# Patient Record
Sex: Male | Born: 1950 | Race: White | Hispanic: No | Marital: Married | Smoking: Never smoker
Health system: Southern US, Community
[De-identification: ages and names within clinical notes are randomized; demographics above are authoritative.]

## PROBLEM LIST (undated history)

## (undated) DIAGNOSIS — R740 Nonspecific elevation of levels of transaminase and lactic acid dehydrogenase [LDH]: Secondary | ICD-10-CM

## (undated) DIAGNOSIS — I428 Other cardiomyopathies: Secondary | ICD-10-CM

## (undated) DIAGNOSIS — R7401 Elevation of levels of liver transaminase levels: Secondary | ICD-10-CM

## (undated) DIAGNOSIS — F419 Anxiety disorder, unspecified: Secondary | ICD-10-CM

## (undated) DIAGNOSIS — Z95810 Presence of automatic (implantable) cardiac defibrillator: Secondary | ICD-10-CM

## (undated) DIAGNOSIS — K219 Gastro-esophageal reflux disease without esophagitis: Secondary | ICD-10-CM

## (undated) DIAGNOSIS — I472 Ventricular tachycardia, unspecified: Secondary | ICD-10-CM

## (undated) DIAGNOSIS — I10 Essential (primary) hypertension: Secondary | ICD-10-CM

## (undated) DIAGNOSIS — R55 Syncope and collapse: Secondary | ICD-10-CM

## (undated) DIAGNOSIS — D126 Benign neoplasm of colon, unspecified: Secondary | ICD-10-CM

## (undated) DIAGNOSIS — E119 Type 2 diabetes mellitus without complications: Secondary | ICD-10-CM

## (undated) HISTORY — DX: Ventricular tachycardia, unspecified: I47.20

## (undated) HISTORY — PX: GALLBLADDER SURGERY: SHX652

## (undated) HISTORY — DX: Other cardiomyopathies: I42.8

## (undated) HISTORY — DX: Elevation of levels of liver transaminase levels: R74.01

## (undated) HISTORY — DX: Nonspecific elevation of levels of transaminase and lactic acid dehydrogenase (ldh): R74.0

## (undated) HISTORY — DX: Ventricular tachycardia: I47.2

## (undated) HISTORY — DX: Presence of automatic (implantable) cardiac defibrillator: Z95.810

## (undated) HISTORY — DX: Anxiety disorder, unspecified: F41.9

## (undated) HISTORY — PX: CARDIAC DEFIBRILLATOR PLACEMENT: SHX171

## (undated) HISTORY — DX: Type 2 diabetes mellitus without complications: E11.9

## (undated) HISTORY — PX: TONSILLECTOMY AND ADENOIDECTOMY: SHX28

## (undated) HISTORY — PX: HERNIA REPAIR: SHX51

## (undated) HISTORY — DX: Benign neoplasm of colon, unspecified: D12.6

## (undated) HISTORY — PX: CHOLECYSTECTOMY: SHX55

## (undated) HISTORY — DX: Gastro-esophageal reflux disease without esophagitis: K21.9

## (undated) HISTORY — DX: Syncope and collapse: R55

## (undated) HISTORY — DX: Essential (primary) hypertension: I10

---

## 1998-04-27 ENCOUNTER — Ambulatory Visit (HOSPITAL_COMMUNITY): Admission: RE | Admit: 1998-04-27 | Discharge: 1998-04-27 | Payer: Self-pay | Admitting: Urology

## 1998-05-06 ENCOUNTER — Emergency Department (HOSPITAL_COMMUNITY): Admission: EM | Admit: 1998-05-06 | Discharge: 1998-05-06 | Payer: Self-pay | Admitting: Emergency Medicine

## 2009-12-17 LAB — CBC AND DIFFERENTIAL
Platelets: 229 10*3/uL (ref 150–399)
WBC: 6.9 10^3/mL

## 2009-12-17 LAB — BASIC METABOLIC PANEL
Creatinine: 1 mg/dL (ref <=1.3)
Glucose: 100 mg/dL
Potassium: 4.4 mmol/L (ref 3.4–5.3)

## 2009-12-17 LAB — LIPID PANEL
Cholesterol: 192 mg/dL (ref 0–200)
LDL Cholesterol: 111 mg/dL
Triglycerides: 240 mg/dL — AB (ref 40–160)

## 2009-12-17 LAB — TSH: TSH: 2.31 u[IU]/mL (ref <=5.90)

## 2011-03-22 ENCOUNTER — Encounter: Payer: Self-pay | Admitting: Internal Medicine

## 2011-04-28 ENCOUNTER — Encounter: Payer: Self-pay | Admitting: Internal Medicine

## 2011-08-16 ENCOUNTER — Encounter: Payer: Self-pay | Admitting: Internal Medicine

## 2011-09-05 ENCOUNTER — Encounter: Payer: Self-pay | Admitting: Internal Medicine

## 2011-09-05 ENCOUNTER — Encounter: Payer: Self-pay | Admitting: *Deleted

## 2011-09-06 ENCOUNTER — Encounter: Payer: Self-pay | Admitting: Internal Medicine

## 2011-09-06 ENCOUNTER — Ambulatory Visit (INDEPENDENT_AMBULATORY_CARE_PROVIDER_SITE_OTHER): Payer: BC Managed Care – PPO | Admitting: Internal Medicine

## 2011-09-06 VITALS — BP 116/74 | HR 90 | Ht 65.0 in | Wt 202.1 lb

## 2011-09-06 DIAGNOSIS — I472 Ventricular tachycardia: Secondary | ICD-10-CM | POA: Insufficient documentation

## 2011-09-06 DIAGNOSIS — Z95810 Presence of automatic (implantable) cardiac defibrillator: Secondary | ICD-10-CM | POA: Insufficient documentation

## 2011-09-06 DIAGNOSIS — R55 Syncope and collapse: Secondary | ICD-10-CM

## 2011-09-06 DIAGNOSIS — F419 Anxiety disorder, unspecified: Secondary | ICD-10-CM | POA: Insufficient documentation

## 2011-09-06 NOTE — Assessment & Plan Note (Signed)
The patient has had problems with ventricular tachycardia and syncope. He had storm following ICD implantation prompting amiodarone and this begs the question as to whether the ICD lead implantation itself was proarrhythmic. In any cas he has had some PT since then treated with antitachycardia pacing and he is on amiodarone at 100 mg a day.  The underlying pathophysiology as to why he is having ventricular tachycardia in the setting of a structurally normal heart is also not clear. There is a reference to the EP study about single PVCs. This is certainly been described. I will need to review this with his electrophysiologist from [place].

## 2011-09-06 NOTE — Progress Notes (Signed)
HPI: Dakota Gutierrez is a 60 y.o. male Seen to establish EP care in [place].  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 He was in [place] and he underwent an evaluation which because of nonsustained ventricular tachycardia included an EP study. He was found to be easily inducible to polymorphic ventricular tachycardia and received an ICD. Unfortunately, in the days that followed he developed VT storm with multiple shocks and was treated with amiodarone which has been gradually down titrated to 100 mg a day. He had an episode of monomorphic ventricular tachycardia in June terminated by antitachycardia pacing. He has lived in fear of the next shock. His anxiety has been progressively less debilitating.  He has no significant exercise intolerance. He denies chest pain shortness of breath. His evaluation also included a stress test that was nonischemic in echo that confirmed normal left ventricular function. These data are reviewed Current Outpatient Prescriptions  Medication Sig Dispense Refill  . amiodarone (PACERONE) 200 MG tablet Take 100 mg by mouth daily.        Marland Kitchen aspirin 325 MG tablet Take 325 mg by mouth daily.        Marland Kitchen losartan-hydrochlorothiazide (HYZAAR) 100-12.5 MG per tablet Take 1 tablet by mouth daily.        . metoprolol (TOPROL-XL) 50 MG 24 hr tablet Take 50 mg by mouth every 12 (twelve) hours.        . Omega-3 Fatty Acids (FISH OIL) 1000 MG CAPS Take 1 capsule by mouth daily.        . tadalafil (CIALIS) 20 MG tablet Take 20 mg by mouth daily as needed.        . valACYclovir (VALTREX) 500 MG tablet Take 250 mg by mouth daily.          No Known Allergies  Past Medical History  Diagnosis Date  . Syncope   . Tachycardia   . HTN (hypertension)     Past Surgical History  Procedure Date  . Cardiac defibrillator placement     No family history on file.  History   Social History  . Marital Status: Unknown    Spouse Name: N/A    Number of Children: N/A  .  Years of Education: N/A   Occupational History  . Not on file.   Social History Main Topics  . Smoking status: Never Smoker   . Smokeless tobacco: Never Used  . Alcohol Use: Yes  . Drug Use: No  . Sexually Active: Not on file   Other Topics Concern  . Not on file   Social History Narrative  . No narrative on file    Fourteen point review of systems was negative except as noted in HPI and PMH   PHYSICAL EXAMINATION  Blood pressure 116/74, pulse 90, height 5\' 5"  (1.651 m), weight 202 lb 1.9 oz (91.681 kg).   Well developed and nourished in no acute distress HENT normal Neck supple with JVP-flat Carotids brisk and full without bruits Back without scoliosis or kyphosis Clear Regular rate and rhythm, no murmurs or gallops Abd-soft with active BS without hepatomegaly or midline pulsation Femoral pulses 2+ distal pulses intact No Clubbing cyanosis edema Skin-warm and dry LN-neg submandibular and supraclavicular A & Oriented CN 3-12 normal  Grossly normal sensory and motor function Affect engaging .

## 2011-09-06 NOTE — Patient Instructions (Signed)
Your physician wants you to follow-up in: 3 months. You will receive a reminder letter in the mail two months in advance. If you don't receive a letter, please call our office to schedule the follow-up appointment.  Call us if you decide you want to see Dr. Caralyn Guile.

## 2011-09-06 NOTE — Assessment & Plan Note (Signed)
I have offered him consultation with Dr. Dellia Cloud for posttraumatic stress disorder. He'll let us know.

## 2011-09-06 NOTE — Assessment & Plan Note (Signed)
The patient's device was interrogated.  The information was reviewed. No changes were made in the programming. His ICD is programmed unusually. He is set at a lower rate of 80 with rate response on. He is 100% atrially paced. I turned him to a lower rate of 40 and he has intrinsic conduction at 70 beats per minute; there was an isolated PVC and I wonder whether the atrial rate was chosen for overdrive suppression of ventricular ectopy.

## 2011-09-06 NOTE — Assessment & Plan Note (Signed)
No recurrent syncope. I advised him that in [place] ventricular tachycardia and syncope in the setting of a structurally abnormal heart visit for the presence of his ICD preclude driving for 6 months

## 2011-09-19 ENCOUNTER — Telehealth: Payer: Self-pay | Admitting: Internal Medicine

## 2011-09-19 NOTE — Telephone Encounter (Signed)
Pt GF calling wanting to know if pt needs to be seen in Jan 2013 or sooner. MD was suppose to contact pt previous MD in [place] Dr. Nedra Hai and get back to pt if he needed to be seen before the recall. Please return pt GF call to discuss if pt needs to be seen sooner.

## 2011-09-19 NOTE — Telephone Encounter (Signed)
havenot spoken with him   Will try and get up with him this week

## 2011-09-19 NOTE — Telephone Encounter (Signed)
Mr Kindleberger's girlfriend wants to know if Dr Graciela Husbands talked to Dr Nedra Hai in [place], [place] about his past treatment and if Mr Allie Bossier needs to be seen sooner after talking to Dr Nedra Hai.  Please call and let her know.

## 2011-09-20 NOTE — Telephone Encounter (Signed)
Pt was notified.  

## 2011-09-21 NOTE — Telephone Encounter (Signed)
I spoke with Clayborne Dana to obtain the number for Dr. Nedra Hai.   Dr. Eustace Pen [facility]- [facility]. [place] [phone]  I explained to [facility] that Dr. Graciela Husbands was going to try to speak to Dr. Nedra Hai today and that we can hopefully be back in touch with them today, tomorrow at the latest. Per Clayborne Dana, they will be going back to [facility] tomorrow. We can try her on her cell at [phone] or leave a message at the home number as she will be checking their messages.

## 2011-09-21 NOTE — Telephone Encounter (Signed)
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 There is no thought that the ICD lead was proarrhythmic. Consideration was given to catheter ablation of the PVCs; however, because he was moving to [place] this was deferred. There is no 12-lead on cardiographic recording of the PVCs.

## 2011-09-21 NOTE — Telephone Encounter (Signed)
I spoke with the patient and outlined the previous note will see him in 2 months to look at the PBC burden.  We may consider transitioning his amiodarone to  A 1c agent for a PVC suppression

## 2011-11-10 ENCOUNTER — Encounter: Payer: Self-pay | Admitting: *Deleted

## 2011-11-11 ENCOUNTER — Encounter: Payer: Self-pay | Admitting: Internal Medicine

## 2011-11-11 ENCOUNTER — Ambulatory Visit (INDEPENDENT_AMBULATORY_CARE_PROVIDER_SITE_OTHER): Payer: BC Managed Care – PPO | Admitting: Internal Medicine

## 2011-11-11 DIAGNOSIS — I472 Ventricular tachycardia: Secondary | ICD-10-CM

## 2011-11-11 DIAGNOSIS — Z95810 Presence of automatic (implantable) cardiac defibrillator: Secondary | ICD-10-CM

## 2011-11-11 DIAGNOSIS — R55 Syncope and collapse: Secondary | ICD-10-CM

## 2011-11-11 DIAGNOSIS — F411 Generalized anxiety disorder: Secondary | ICD-10-CM

## 2011-11-11 DIAGNOSIS — F419 Anxiety disorder, unspecified: Secondary | ICD-10-CM

## 2011-11-11 MED ORDER — AMIODARONE HCL 200 MG PO TABS: ORAL_TABLET | ORAL | Status: DC

## 2011-11-11 MED ORDER — METOPROLOL TARTRATE 50 MG PO TABS: 50.0000 mg | ORAL_TABLET | Freq: Two times a day (BID) | ORAL | Status: DC

## 2011-11-11 NOTE — Assessment & Plan Note (Signed)
Much improved

## 2011-11-11 NOTE — Assessment & Plan Note (Addendum)
The patient's device was interrogated.  The information was reviewed. No changes were made in the programming.    Will reprogram pacing rate 80>>75 and anticipate decrease again in 6 months

## 2011-11-11 NOTE — Patient Instructions (Signed)
Your physician recommends that you have lab work today: bmp/liver/tsh   Your physician has recommended you make the following change in your medication:  1) Decrease aspirin to 81 mg once daily.  Remote monitoring is used to monitor your Pacemaker of ICD from home. This monitoring reduces the number of office visits required to check your device to one time per year. It allows [name] to keep an eye on the functioning of your device to ensure it is working properly. You are scheduled for a device check from home on 02/16/12. You may send your transmission at any time that day. If you have a wireless device, the transmission will be sent automatically. After your physician reviews your transmission, you will receive a postcard with your next transmission date.  Your physician wants you to follow-up in: 6 months. You will receive a reminder letter in the mail two months in advance. If you don't receive a letter, please call our office to schedule the follow-up appointment.

## 2011-11-11 NOTE — Assessment & Plan Note (Addendum)
No recurrent ventricular tachycardia. We'll check amiodarone surveillance laboratories today. Also as he is on Hydrocchlor thiazide we will check his potassium level  Also we will decrease his aspirin from 325>>81 risk-benefit

## 2011-11-11 NOTE — Progress Notes (Signed)
Addended by: Sherri Rad C on: 11/11/2011 11:23 AM   Modules accepted: Orders

## 2011-11-11 NOTE — Progress Notes (Signed)
  HPI  Dakota Gutierrez is a 60 y.o. 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 Unfortunately, in the days that followed he developed VT storm with multiple shocks and was treated with amiodarone which has been gradually down titrated to 100 mg a day. He had an episode of monomorphic ventricular tachycardia in June terminated by antitachycardia pacing.   The patient denies chest pain, shortness of breath, nocturnal dyspnea, orthopnea or peripheral edema.  There have been no palpitations, lightheadedness or syncope. He has had much less anxiety   Past Medical History  Diagnosis Date  . Syncope   . Ventricular tachycardia     storm following ICD implant on amiodarone   . HTN (hypertension)   . Dual implantable cardiac defibrillator in situ     Medtronic [id number] Protecta  . 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 Cardiac defibrillator placement     Medtronic [id number] Protecta    Current Outpatient Prescriptions  Medication Sig Dispense Refill  . amiodarone (PACERONE) 200 MG tablet Take 100 mg by mouth daily.        Marland Kitchen aspirin 325 MG tablet Take 325 mg by mouth daily.        Marland Kitchen losartan-hydrochlorothiazide (HYZAAR) 100-12.5 MG per tablet Take 1 tablet by mouth daily.        . metoprolol (TOPROL-XL) 50 MG 24 hr tablet Take 50 mg by mouth every 12 (twelve) hours.        . Omega-3 Fatty Acids (FISH OIL) 1000 MG CAPS Take 1 capsule by mouth daily.        . tadalafil (CIALIS) 20 MG tablet Take 20 mg by mouth daily as needed.        . valACYclovir (VALTREX) 500 MG tablet Take 250 mg by mouth daily.          No Known Allergies  Review of Systems negative except from HPI and PMH  Physical Exam Well developed and well nourished in no acute distress HENT normal E scleral and icterus clear Neck Supple JVP flat; carotids brisk and full Clear to  ausculation Regular rate and rhythm, no murmurs gallops or rub Soft with active bowel sounds No clubbing cyanosis none Edema Alert and oriented, grossly normal motor and sensory function Skin Warm and Dry  ECG demonstrates sinus rhythm at 97 with intervals of 0.2 to +0.10/435 Axis is -10 Assessment and  Plan

## 2011-11-11 NOTE — Assessment & Plan Note (Signed)
No recurrent syncope 

## 2011-11-16 ENCOUNTER — Telehealth: Payer: Self-pay | Admitting: *Deleted

## 2011-11-16 DIAGNOSIS — I472 Ventricular tachycardia: Secondary | ICD-10-CM

## 2011-11-16 NOTE — Telephone Encounter (Signed)
I spoke with the patient's wife. In checking his chart, I did not see his labs posted. Mellody Dance checked this. We verified with the patient that he did get stuck the day he was here. Per Keith's call to the lab, blood may still be on the rack. I have made the patient's wife aware that we will need to have him come in for another lab draw. They verbalize understanding.

## 2011-11-17 ENCOUNTER — Other Ambulatory Visit: Payer: BC Managed Care – PPO | Admitting: *Deleted

## 2011-11-18 ENCOUNTER — Other Ambulatory Visit: Payer: BC Managed Care – PPO | Admitting: *Deleted

## 2011-11-18 LAB — BASIC METABOLIC PANEL
BUN: 18 mg/dL (ref 6–23)
Calcium: 8.9 mg/dL (ref 8.4–10.5)
Chloride: 103 mEq/L (ref 96–112)
Creatinine, Ser: 1.1 mg/dL (ref 0.4–1.5)
GFR: 75.9 mL/min (ref >60.00)

## 2011-11-18 LAB — HEPATIC FUNCTION PANEL
Bilirubin, Direct: 0.1 mg/dL (ref 0.0–0.3)
Total Bilirubin: 0.8 mg/dL (ref 0.3–1.2)

## 2011-11-18 LAB — TSH: TSH: 2.16 u[IU]/mL (ref 0.35–5.50)

## 2011-11-19 ENCOUNTER — Encounter: Payer: Self-pay | Admitting: Internal Medicine

## 2011-11-24 ENCOUNTER — Other Ambulatory Visit: Payer: Self-pay | Admitting: *Deleted

## 2011-11-24 DIAGNOSIS — Z79899 Other long term (current) drug therapy: Secondary | ICD-10-CM

## 2011-11-24 DIAGNOSIS — I4891 Unspecified atrial fibrillation: Secondary | ICD-10-CM

## 2011-12-08 ENCOUNTER — Encounter (INDEPENDENT_AMBULATORY_CARE_PROVIDER_SITE_OTHER): Payer: BC Managed Care – PPO | Admitting: Family Medicine

## 2011-12-08 ENCOUNTER — Ambulatory Visit: Payer: Self-pay | Admitting: Family Medicine

## 2011-12-08 DIAGNOSIS — F411 Generalized anxiety disorder: Secondary | ICD-10-CM

## 2011-12-08 DIAGNOSIS — I10 Essential (primary) hypertension: Secondary | ICD-10-CM

## 2011-12-08 DIAGNOSIS — R1013 Epigastric pain: Secondary | ICD-10-CM

## 2011-12-08 DIAGNOSIS — K3189 Other diseases of stomach and duodenum: Secondary | ICD-10-CM

## 2011-12-08 NOTE — Progress Notes (Signed)
This encounter was created in error - please disregard.

## 2011-12-22 ENCOUNTER — Encounter: Payer: BC Managed Care – PPO | Attending: Family Medicine | Admitting: *Deleted

## 2011-12-22 ENCOUNTER — Encounter: Payer: Self-pay | Admitting: *Deleted

## 2011-12-22 DIAGNOSIS — Z713 Dietary counseling and surveillance: Secondary | ICD-10-CM | POA: Insufficient documentation

## 2011-12-22 DIAGNOSIS — E669 Obesity, unspecified: Secondary | ICD-10-CM | POA: Insufficient documentation

## 2011-12-22 NOTE — Progress Notes (Signed)
  Medical Nutrition Therapy:  Appt start time: 1000 end time:  1100.  Assessment:  Primary concerns today: Obesity.  Pt here for assessment of obesity.  S/P pacemaker and defib placement on 04/28/11 with VT Storm shortly following surgery. Pt now stable and desires wt loss. Reports a "bad diet" prior to surgery, but has made many positive dietary changes since.  Excessive CHO intake noted. Has been exercising since beginning of 2013; tires easily d/t heart condition. No restrictions reported.   MEDICATIONS: See medication list; reconciled with pt.   DIETARY INTAKE:  Usual eating pattern includes 2-3 meals and 2 snacks per day.  24-hr recall:  B ( AM): (1 cup) Raisin Bran or Special K w/ 2% milk (drinks milk), toast (whole wheat) w/ butter (2 tsp); water Snk ( AM): banana or orange; water  L ( PM): Pizza (pep, mush, extra chs) - 1.5 pcs; water Snk ( PM): ritz or saltines w/ PB or cheese - 2 or 3 D ( PM): Eats out or Ham sandwich w/ mustard, lettuce on wheat, sometimes chips (handful); 8 oz root beer or water Snk ( PM): None Beverages: Reg root beer  Usual physical activity:  Since 11/29/11 - 30 min tv workouts (cardio/strength) @ 4x/wk, walks 1-2.5 miles @ 2x/wk; Getting elliptical tomorrow.    Estimated energy needs: 1400-1600 calories 175-200 g carbohydrates 85-100 g protein 40-45 g fat (<15g as saturated fat)  Progress Towards Goal(s):  In progress.   Nutritional Diagnosis:  [place]-3.3 Obesity related to previous excessive intake of CHO and fat as evidenced by patient reported food history and a BMI of 33.5 kg/m^2.    Intervention/Main Goals:  Follow "Phase Plan" as discussed using yellow card.  Eat 3 meals/day, Avoid meal skipping.   Limit carbohydrate to 45g per meal and 15g per snack.  Add protein rich foods to all meals/snacks.   Choose more whole grains, lean protein, low-fat dairy, and fruits/non-starchy vegetables.   Aim for >30 min of physical activity daily.  Limit  sugar-sweetened beverages, concentrated sweets, and high fat/fried foods.    Monitoring/Evaluation:  Dietary intake, exercise, and body weight in 6 week(s).

## 2011-12-22 NOTE — Patient Instructions (Addendum)
Goals:  Follow "Phase Plan" as discussed using yellow card.  Eat 3 meals/day, Avoid meal skipping.   Limit carbohydrate to 45g per meal and 15g per snack.  Add protein rich foods to all meals/snacks.   Choose more whole grains, lean protein, low-fat dairy, and fruits/non-starchy vegetables.   Aim for >30 min of physical activity daily.  Limit sugar-sweetened beverages, concentrated sweets, and high fat/fried foods.

## 2011-12-29 ENCOUNTER — Ambulatory Visit (INDEPENDENT_AMBULATORY_CARE_PROVIDER_SITE_OTHER): Payer: BC Managed Care – PPO | Admitting: Family Medicine

## 2011-12-29 ENCOUNTER — Encounter: Payer: Self-pay | Admitting: Family Medicine

## 2011-12-29 VITALS — BP 109/79 | HR 100 | Temp 97.3°F | Resp 20 | Ht 65.5 in | Wt 199.0 lb

## 2011-12-29 DIAGNOSIS — R197 Diarrhea, unspecified: Secondary | ICD-10-CM

## 2011-12-29 DIAGNOSIS — A09 Infectious gastroenteritis and colitis, unspecified: Secondary | ICD-10-CM

## 2011-12-29 DIAGNOSIS — E669 Obesity, unspecified: Secondary | ICD-10-CM

## 2011-12-29 MED ORDER — VALACYCLOVIR HCL 1 G PO TABS: 1000.0000 mg | ORAL_TABLET | Freq: Every day | ORAL | Status: AC

## 2011-12-29 NOTE — Patient Instructions (Signed)
Continue current medications and manage diet and exercise as discussed.

## 2011-12-29 NOTE — Progress Notes (Signed)
  Subjective:    Patient ID: Quintavious Rinck, male    DOB: 1951-11-01, 61 y.o.   MRN: [id number]  GI Problem The primary symptoms include diarrhea. Primary symptoms do not include fatigue, abdominal pain, nausea, melena or hematochezia. Episode onset: Pt is here to review response to medication prescribed to treat H.pylori infection. The problem has been gradually improving.  The illness does not include dysphagia or constipation.  Medication prescribed: Prevpak which he finishes today and then continue with Omeprazole for 2 months.  Also, pt has had a visit with Nutritionist to discuss weight loss and dietary changes; he has made some small modifications in diet and has set a weight loss goal of 25 lbs. He returns for follow-up re: weight loss in 2 months.    Review of Systems  Constitutional: Negative for fatigue.  Gastrointestinal: Positive for diarrhea. Negative for dysphagia, nausea, abdominal pain, constipation, melena and hematochezia.       Objective:   Physical Exam  Vitals reviewed. Constitutional: He is oriented to person, place, and time. He appears well-developed and well-nourished.  Pulmonary/Chest: Effort normal.  Neurological: He is oriented to person, place, and time.  Psychiatric: He has a normal mood and affect.   He appears to be in distress; lab data pertaining to H.pylori was reviewed.       Assessment & Plan:   1. Diarrhea of infectious origin - secondary to H. Pylori and improving with appropriate treatment. Pt to continue PPI for 2 months.  2. Obesity - appropriate goals set and pt understands some basic changes to be made to achieve 25 -lb. weight loss in 6- month time frame. Follow-up with nutrition specialist as scheduled.

## 2012-01-12 ENCOUNTER — Encounter: Payer: Self-pay | Admitting: Family Medicine

## 2012-01-18 ENCOUNTER — Telehealth: Payer: Self-pay | Admitting: Internal Medicine

## 2012-01-18 LAB — ICD DEVICE OBSERVATION
AL IMPEDENCE ICD: 399 Ohm
AL THRESHOLD: 0.75 V
ATRIAL PACING ICD: 98.99 pct
BATTERY VOLTAGE: 3.173 V
CHARGE TIME: 7.887 s
DEV-0020ICD: NEGATIVE
TOT-0001: 6
TOT-0002: 7
TOT-0006: 20120531000000
TZAT-0001ATACH: 1
TZAT-0001ATACH: 2
TZAT-0001ATACH: 3
TZAT-0001FASTVT: 1
TZAT-0001SLOWVT: 1
TZAT-0002ATACH: NEGATIVE
TZAT-0002FASTVT: NEGATIVE
TZAT-0011SLOWVT: 10 ms
TZAT-0012ATACH: 150 ms
TZAT-0012ATACH: 150 ms
TZAT-0012ATACH: 150 ms
TZAT-0013SLOWVT: 3
TZAT-0018ATACH: NEGATIVE
TZAT-0018ATACH: NEGATIVE
TZAT-0018SLOWVT: NEGATIVE
TZAT-0019ATACH: 6 V
TZAT-0019SLOWVT: 8 V
TZAT-0020ATACH: 1.5 ms
TZON-0003ATACH: 350 ms
TZON-0003VSLOWVT: 450 ms
TZON-0004SLOWVT: 28
TZON-0004VSLOWVT: 48
TZON-0005SLOWVT: 16
TZST-0001ATACH: 4
TZST-0001ATACH: 6
TZST-0001FASTVT: 3
TZST-0001FASTVT: 5
TZST-0001SLOWVT: 2
TZST-0001SLOWVT: 3
TZST-0001SLOWVT: 5
TZST-0001SLOWVT: 6
TZST-0002ATACH: NEGATIVE
TZST-0002FASTVT: NEGATIVE
TZST-0002FASTVT: NEGATIVE
TZST-0003SLOWVT: 35 J
TZST-0003SLOWVT: 35 J
VENTRICULAR PACING ICD: 19.27 pct

## 2012-01-18 NOTE — Telephone Encounter (Signed)
New msg Pt had some questions about defib ck please call

## 2012-01-18 NOTE — Telephone Encounter (Signed)
Spoke w/pt and answered questions about Field seismologist. Pt has transmitter hooked up and to send initial transmission today. Pt to call with any questions.

## 2012-01-19 ENCOUNTER — Other Ambulatory Visit (INDEPENDENT_AMBULATORY_CARE_PROVIDER_SITE_OTHER): Payer: BC Managed Care – PPO

## 2012-01-19 DIAGNOSIS — Z79899 Other long term (current) drug therapy: Secondary | ICD-10-CM

## 2012-01-19 DIAGNOSIS — I4891 Unspecified atrial fibrillation: Secondary | ICD-10-CM

## 2012-01-19 DIAGNOSIS — I472 Ventricular tachycardia: Secondary | ICD-10-CM

## 2012-01-19 LAB — HEPATIC FUNCTION PANEL
ALT: 47 U/L (ref 0–53)
AST: 47 U/L — ABNORMAL HIGH (ref 0–37)
Bilirubin, Direct: 0.1 mg/dL (ref 0.0–0.3)
Total Bilirubin: 0.8 mg/dL (ref 0.3–1.2)
Total Protein: 7.2 g/dL (ref 6.0–8.3)

## 2012-01-19 LAB — BASIC METABOLIC PANEL
BUN: 18 mg/dL (ref 6–23)
CO2: 27 mEq/L (ref 19–32)
Chloride: 103 mEq/L (ref 96–112)
Creatinine, Ser: 1.2 mg/dL (ref 0.4–1.5)
Potassium: 3.6 mEq/L (ref 3.5–5.1)

## 2012-01-19 LAB — TSH: TSH: 2.71 u[IU]/mL (ref 0.35–5.50)

## 2012-01-20 LAB — HEPATIC FUNCTION PANEL
ALT: 47 U/L (ref 0–53)
AST: 52 U/L — ABNORMAL HIGH (ref 0–37)
Albumin: 4.5 g/dL (ref 3.5–5.2)
Total Bilirubin: 0.5 mg/dL (ref 0.3–1.2)

## 2012-01-27 ENCOUNTER — Other Ambulatory Visit: Payer: Self-pay | Admitting: *Deleted

## 2012-01-27 DIAGNOSIS — Z79899 Other long term (current) drug therapy: Secondary | ICD-10-CM

## 2012-01-27 DIAGNOSIS — I472 Ventricular tachycardia: Secondary | ICD-10-CM

## 2012-01-30 ENCOUNTER — Telehealth: Payer: Self-pay | Admitting: Internal Medicine

## 2012-01-30 NOTE — Telephone Encounter (Signed)
Dakota Gutierrez's girlfriend is concerned about Dakota Gutierrez stopping the amiodarone.  She doesn't understand why he was on it in the first place if it can be stopped because of elevated liver tests.  She also wants to know if his heart is going to be ok and will be in rhythm if he stops the amiodarone?  She is requesting a call from Dr Odessa Fleming nurse to discuss this situation and give her more information.  She is also wondering if he should have an appt to discuss this face to face with  Dr Graciela Husbands.

## 2012-01-30 NOTE — Telephone Encounter (Signed)
New Msg: Pt girlfriend calling wanting to speak with nurse/MD regarding pt stopping taking amiodarone. Please return call to discuss further.

## 2012-01-31 NOTE — Telephone Encounter (Signed)
i am glad to sit down and review this in the office  The concern is the toxicity of the drug suggested by the liver tests and we made to use different medications  thx

## 2012-01-31 NOTE — Telephone Encounter (Signed)
Sending to MD

## 2012-02-01 NOTE — Telephone Encounter (Signed)
Pt's girlfriend called and pt has not stopped amiodarone like he was told, they have cut it down but didn't want to go off completely with out another med in it's place, she wants to know if he needs appt with klein to discuss or can he call him? Will he be put on another med? Home # [phone] or girlfriend patti cell [phone]/mt

## 2012-02-01 NOTE — Telephone Encounter (Signed)
Patient's girlfriend called upset about patient stopping amoidarone,wanting appointment with Dr.Klein to discuss.Appointment scheduled with Dr.Klein 02/02/12.

## 2012-02-02 ENCOUNTER — Encounter: Payer: Self-pay | Admitting: Internal Medicine

## 2012-02-02 ENCOUNTER — Encounter: Payer: Self-pay | Admitting: *Deleted

## 2012-02-02 ENCOUNTER — Encounter: Payer: BC Managed Care – PPO | Attending: Family Medicine | Admitting: *Deleted

## 2012-02-02 ENCOUNTER — Ambulatory Visit (INDEPENDENT_AMBULATORY_CARE_PROVIDER_SITE_OTHER): Payer: BC Managed Care – PPO | Admitting: Internal Medicine

## 2012-02-02 DIAGNOSIS — F411 Generalized anxiety disorder: Secondary | ICD-10-CM

## 2012-02-02 DIAGNOSIS — F419 Anxiety disorder, unspecified: Secondary | ICD-10-CM

## 2012-02-02 DIAGNOSIS — I472 Ventricular tachycardia: Secondary | ICD-10-CM

## 2012-02-02 DIAGNOSIS — R7401 Elevation of levels of liver transaminase levels: Secondary | ICD-10-CM

## 2012-02-02 DIAGNOSIS — Z713 Dietary counseling and surveillance: Secondary | ICD-10-CM | POA: Insufficient documentation

## 2012-02-02 DIAGNOSIS — I428 Other cardiomyopathies: Secondary | ICD-10-CM

## 2012-02-02 DIAGNOSIS — E669 Obesity, unspecified: Secondary | ICD-10-CM | POA: Insufficient documentation

## 2012-02-02 LAB — ICD DEVICE OBSERVATION
AL AMPLITUDE: 3.375 mv
AL IMPEDENCE ICD: 437 Ohm
AL THRESHOLD: 0.625 V
BAMS-0001: 170 {beats}/min
BATTERY VOLTAGE: 3.1798 V
FVT: 0
RV LEAD AMPLITUDE: 15.25 mv
RV LEAD IMPEDENCE ICD: 437 Ohm
RV LEAD THRESHOLD: 0.625 V
TOT-0006: 20120531000000
TZAT-0001ATACH: 1
TZAT-0001ATACH: 3
TZAT-0002ATACH: NEGATIVE
TZAT-0002ATACH: NEGATIVE
TZAT-0004SLOWVT: 10
TZAT-0011SLOWVT: 10 ms
TZAT-0012ATACH: 150 ms
TZAT-0012FASTVT: 200 ms
TZAT-0018ATACH: NEGATIVE
TZAT-0018ATACH: NEGATIVE
TZAT-0018FASTVT: NEGATIVE
TZAT-0019ATACH: 6 V
TZAT-0019ATACH: 6 V
TZAT-0019FASTVT: 8 V
TZAT-0019SLOWVT: 8 V
TZAT-0020FASTVT: 1.5 ms
TZAT-0020SLOWVT: 1.5 ms
TZST-0001ATACH: 5
TZST-0001ATACH: 6
TZST-0001FASTVT: 5
TZST-0001FASTVT: 6
TZST-0001SLOWVT: 3
TZST-0001SLOWVT: 4
TZST-0001SLOWVT: 6
TZST-0002ATACH: NEGATIVE
TZST-0002ATACH: NEGATIVE
TZST-0002FASTVT: NEGATIVE
TZST-0002FASTVT: NEGATIVE
TZST-0002FASTVT: NEGATIVE
TZST-0003SLOWVT: 15 J
TZST-0003SLOWVT: 35 J
VF: 0

## 2012-02-02 NOTE — Assessment & Plan Note (Signed)
As above.

## 2012-02-02 NOTE — Assessment & Plan Note (Signed)
i presume this is related to amiodarone   We have discussed stopping and the patient and family have concenrs given the context in which it was initiated   I think we need to stop the drug and see if LFT elevation resolves they are reluctantly agreeable

## 2012-02-02 NOTE — Progress Notes (Signed)
  Medical Nutrition Therapy:  Appt start time: 10:15  end time:  10:30.  Assessment:  Primary concerns today: Obesity; Follow up. Pt returns for f/u on obesity dx. Short appointment d/t pt being late. Reports doing well (down 8 lbs) until 01/21/12 when his ex-wife passed away. Extreme sadness and stress brought on emotional eating, which caused a 5 lb weight gain. Pt feels he is back on track now and is doing well.  Does report some pain in chest area around defibrillator which he plans to contact MD about. Has been walking and doing elliptical for 1 mi/30 min. Still no dietary restrictions reported.   MEDICATIONS: See medication list; No changes reported.   DIETARY INTAKE:  Usual eating pattern includes 2-3 meals and 2 snacks per day.  24-hr recall:  Reports cutting down portions after getting "back on wagon" after death in family.   Usual physical activity:  Recently walking 1 mile @ 2-3d/wk; elliptical for 30 min  Estimated energy needs: 1400-1600 calories 175-200 g carbohydrates 85-100 g protein 40-45 g fat (<15g as saturated fat)  Progress Towards Goal(s):  In progress.   Nutritional Diagnosis:  [place]-3.3 Obesity related to previous excessive intake of CHO and fat as evidenced by patient reported food history and a BMI of 33.5 kg/m^2.    Intervention/Main Goals:  Resume previous nutrition and exercise goals (per MD clearance).  Follow up in 3 months after next MD appointment.    Monitoring/Evaluation:  Dietary intake, exercise, and body weight in 3 months.

## 2012-02-02 NOTE — Progress Notes (Signed)
  HPI  Dakota Gutierrez is a 61 y.o. 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 Unfortunately, in the days that followed he developed VT storm with multiple shocks and was treated with amiodarone which has been gradually down titrated to 100 mg a day. He had an episode of monomorphic ventricular tachycardia in June terminated by antitachycardia pacing.   The patient denies chest pain, shortness of breath, nocturnal dyspnea, orthopnea or peripheral edema.  There have been no palpitations, lightheadedness or syncope. He has had much less anxiety  Amio surveillance demonstrated elevation Of LFTs   Past Medical History  Diagnosis Date  . Syncope   . Ventricular tachycardia     storm following ICD implant on amiodarone   . HTN (hypertension)   . Dual implantable cardiac defibrillator in situ     Medtronic [id number] Protecta  . 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 Elevated transaminase level     10/2011  ? amio  . Other primary cardiomyopathies     Past Surgical History  Procedure Date  . Cardiac defibrillator placement     Medtronic [id number] Protecta  . Tonsillectomy and adenoidectomy   . Gallbladder surgery   . Hernia repair     Current Outpatient Prescriptions  Medication Sig Dispense Refill  . amiodarone (PACERONE) 200 MG tablet Take 200 mg by mouth daily.      Marland Kitchen aspirin EC 81 MG tablet Take 1 tablet (81 mg total) by mouth daily.      Marland Kitchen losartan-hydrochlorothiazide (HYZAAR) 100-12.5 MG per tablet Take 1 tablet by mouth daily.        . metoprolol (LOPRESSOR) 50 MG tablet Take 1 tablet (50 mg total) by mouth 2 (two) times daily.  180 tablet  3  . Omega-3 Fatty Acids (FISH OIL) 1000 MG CAPS Take 1 capsule by mouth 2 (two) times daily.       Marland Kitchen omeprazole (PRILOSEC) 20 MG capsule Take 20 mg by mouth daily.      . tadalafil (CIALIS) 20 MG tablet Take 20 mg by mouth daily as needed.         . valACYclovir (VALTREX) 1000 MG tablet Take 1 tablet (1,000 mg total) by mouth daily.  30 tablet  5    Allergies  Allergen Reactions  . Adhesive (Tape)     Review of Systems negative except from HPI and PMH  Physical Exam Well developed and well nourished in no acute distress HENT normal E scleral and icterus clear Neck Supple JVP flat; carotids brisk and full Clear to ausculation Regular rate and rhythm, no murmurs gallops or rub Soft with active bowel sounds No clubbing cyanosis none Edema Alert and oriented, grossly normal motor and sensory function Skin Warm and Dry    Assessment and  Plan

## 2012-02-02 NOTE — Patient Instructions (Signed)
Goals:  Resume previous nutrition and exercise goals (per MD clearance).  Follow up in 3 months after next MD appointment.

## 2012-02-16 ENCOUNTER — Encounter: Payer: BC Managed Care – PPO | Admitting: *Deleted

## 2012-02-22 ENCOUNTER — Encounter: Payer: Self-pay | Admitting: *Deleted

## 2012-02-28 ENCOUNTER — Other Ambulatory Visit: Payer: Self-pay | Admitting: Family Medicine

## 2012-03-06 ENCOUNTER — Telehealth: Payer: Self-pay | Admitting: Internal Medicine

## 2012-03-06 NOTE — Telephone Encounter (Signed)
New Problem:    Patient received a no show letter for his last transmission date of 02/16/12 when he was under the impression that the appointment would be moved and he would be notified.  Patient will be transmitting today and wanted to give [name] a heads up.

## 2012-03-07 NOTE — Telephone Encounter (Signed)
Spoke with patient.  Appt for remote was old and should have been cancelled when seen last month.  Apologized to patient for confusion and advised that we will check device in June as scheduled with Dr Graciela Husbands.

## 2012-03-22 ENCOUNTER — Ambulatory Visit: Payer: BC Managed Care – PPO | Admitting: Family Medicine

## 2012-03-23 ENCOUNTER — Encounter: Payer: Self-pay | Admitting: Family Medicine

## 2012-03-23 ENCOUNTER — Ambulatory Visit (INDEPENDENT_AMBULATORY_CARE_PROVIDER_SITE_OTHER): Payer: BC Managed Care – PPO | Admitting: Family Medicine

## 2012-03-23 VITALS — BP 115/74 | HR 104 | Temp 98.2°F | Resp 18 | Ht 65.0 in | Wt 194.6 lb

## 2012-03-23 DIAGNOSIS — K625 Hemorrhage of anus and rectum: Secondary | ICD-10-CM

## 2012-03-23 DIAGNOSIS — R197 Diarrhea, unspecified: Secondary | ICD-10-CM

## 2012-03-23 LAB — POCT CBC
Granulocyte percent: 56.9 %G (ref 37–80)
Hemoglobin: 14.8 g/dL (ref 14.1–18.1)
MPV: 9.3 fL (ref 0–99.8)
POC Granulocyte: 3.5 (ref 2–6.9)
POC MID %: 7.8 %M (ref 0–12)
Platelet Count, POC: 226 10*3/uL (ref 142–424)
RBC: 5 M/uL (ref 4.69–6.13)

## 2012-03-23 NOTE — Patient Instructions (Signed)
Rectal Bleeding  Rectal bleeding is when blood passes out of the anus. It is usually a sign that something is wrong. It may not be serious, but it should always be evaluated. Rectal bleeding may present as bright red blood or extremely dark stools. The color may range from dark red or maroon to black (like tar). It is important that the cause of rectal bleeding be identified so treatment can be started and the problem corrected.  CAUSES    Hemorrhoids. These are enlarged (dilated) blood vessels or veins in the anal or rectal area.   Fistulas. Theseare abnormal, burrowing channels that usually run from inside the rectum to the skin around the anus. They can bleed.   Anal fissures. This is a tear in the tissue of the anus. Bleeding occurs with bowel movements.   Diverticulosis. This is a condition in which pockets or sacs project from the bowel wall. Occasionally, the sacs can bleed.   Diverticulitis. Thisis an infection involving diverticulosis of the colon.   Proctitis and colitis. These are conditions in which the rectum, colon, or both, can become inflamed and pitted (ulcerated).   Polyps and cancer. Polyps are non-cancerous (benign) growths in the colon that may bleed. Certain types of polyps turn into cancer.   Protrusion of the rectum. Part of the rectum can project from the anus and bleed.   Certain medicines.   Intestinal infections.   Blood vessel abnormalities.  HOME CARE INSTRUCTIONS   Eat a high-fiber diet to keep your stool soft.   Limit activity.   Drink enough fluids to keep your urine clear or pale yellow.   Warm baths may be useful to soothe rectal pain.   Follow up with your caregiver as directed.  SEEK IMMEDIATE MEDICAL CARE IF:   You develop increased bleeding.   You have black or dark red stools.   You vomit blood or material that looks like coffee grounds.   You have abdominal pain or tenderness.   You have a fever.   You feel weak, nauseous, or you faint.   You have  severe rectal pain or you are unable to have a bowel movement.  MAKE SURE YOU:   Understand these instructions.   Will watch your condition.   Will get help right away if you are not doing well or get worse.  Document Released: 05/06/2002 Document Revised: 11/03/2011 Document Reviewed: 05/01/2011  ExitCare Patient Information 2012 [employer].

## 2012-03-24 ENCOUNTER — Encounter: Payer: Self-pay | Admitting: Family Medicine

## 2012-03-24 NOTE — Progress Notes (Signed)
  Subjective:    Patient ID: Dakota Gutierrez, male    DOB: 05/11/1951, 61 y.o.   MRN: [id number]  HPI  This middle-aged Cauc male returns for follow-up regarding GI issues.He is accompanied today  by Alexia Freestone, his girlfriend. He completed treatment for H.pylori and has a few days of Omeprazole to  finish. He still has several loose stools daily and has recently noticed bloody stools; he has minimal  rampy abdominal pain. No fever, nausea or vomiting.Marland Kitchen His last CRS was ~7 years ago and he was  advised to repeat at 10 years. Pt has no hx of hemorrhoids.         His most recent Cardiology visit was significant for discontinuation of Amiodarone; he feels much better  of this medication.  Review of Systems  Constitutional: Negative for fever, chills, diaphoresis, appetite change and unexpected weight change.  Respiratory: Negative for cough, chest tightness and shortness of breath.   Cardiovascular: Negative for chest pain and palpitations.  Gastrointestinal: Positive for blood in stool. Negative for nausea, vomiting, abdominal pain, constipation and rectal pain.  Genitourinary: Negative.   Musculoskeletal: Negative.   Neurological: Negative for dizziness, weakness and light-headedness.  Hematological: Negative.         Objective:   Physical Exam  Nursing note and vitals reviewed. Constitutional: He is oriented to person, place, and time. He appears well-developed and well-nourished. No distress.  HENT:  Head: Normocephalic and atraumatic.  Eyes: Conjunctivae and EOM are normal. No scleral icterus.  Cardiovascular: Normal rate.   Pulmonary/Chest: Effort normal. No respiratory distress.  Abdominal: Soft. Bowel sounds are normal. He exhibits no distension and no mass. There is no tenderness. There is no guarding.  Genitourinary:       Pt and girlfriend both confirm blood in stools- no rectal exam performed.  Neurological: He is alert and oriented to person, place, and time. No cranial  nerve deficit.  Skin: Skin is warm and dry. No pallor.  Psychiatric: He has a normal mood and affect. His behavior is normal.    Results for orders placed in visit on 03/23/12  POCT CBC      Component Value Range   WBC 6.2  4.6 - 10.2 (K/uL)   Lymph, poc 2.2  0.6 - 3.4    POC LYMPH PERCENT 35.3  10 - 50 (%L)   MID (cbc) 0.5  0 - 0.9    POC MID % 7.8  0 - 12 (%M)   POC Granulocyte 3.5  2 - 6.9    Granulocyte percent 56.9  37 - 80 (%G)   RBC 5.00  4.69 - 6.13 (M/uL)   Hemoglobin 14.8  14.1 - 18.1 (g/dL)   HCT, POC 84.1  32.4 - 53.7 (%)   MCV 89.2  80 - 97 (fL)   MCH, POC 29.6  27 - 31.2 (pg)   MCHC 33.2  31.8 - 35.4 (g/dL)   RDW, POC 40.1     Platelet Count, POC 226  142 - 424 (K/uL)   MPV 9.3  0 - 99.8 (fL)         Assessment & Plan:   1. Bright red rectal bleeding   Ambulatory referral to Gastroenterology  2. Diarrhea  May be side effect of PPI; will not refill Omeprazole once pt finishes current supply

## 2012-03-28 ENCOUNTER — Other Ambulatory Visit (INDEPENDENT_AMBULATORY_CARE_PROVIDER_SITE_OTHER): Payer: BC Managed Care – PPO

## 2012-03-28 DIAGNOSIS — Z79899 Other long term (current) drug therapy: Secondary | ICD-10-CM

## 2012-03-28 DIAGNOSIS — I472 Ventricular tachycardia: Secondary | ICD-10-CM

## 2012-03-28 LAB — HEPATIC FUNCTION PANEL
AST: 69 U/L — ABNORMAL HIGH (ref 0–37)
Alkaline Phosphatase: 47 U/L (ref 39–117)
Total Bilirubin: 2.8 mg/dL — ABNORMAL HIGH (ref 0.3–1.2)

## 2012-04-10 ENCOUNTER — Telehealth: Payer: Self-pay | Admitting: Internal Medicine

## 2012-04-10 DIAGNOSIS — I472 Ventricular tachycardia: Secondary | ICD-10-CM

## 2012-04-10 NOTE — Telephone Encounter (Signed)
Fu call °Pt returning your call  °

## 2012-04-10 NOTE — Telephone Encounter (Signed)
I spoke with the patient and he is aware of his results. He will come on 5/16 for a repeat liver functions tests due to possible hemolysis on the previous result.

## 2012-04-12 ENCOUNTER — Other Ambulatory Visit: Payer: BC Managed Care – PPO

## 2012-04-12 ENCOUNTER — Encounter: Payer: Self-pay | Admitting: Internal Medicine

## 2012-04-13 ENCOUNTER — Other Ambulatory Visit (INDEPENDENT_AMBULATORY_CARE_PROVIDER_SITE_OTHER): Payer: BC Managed Care – PPO

## 2012-04-13 DIAGNOSIS — I472 Ventricular tachycardia: Secondary | ICD-10-CM

## 2012-04-13 LAB — HEPATIC FUNCTION PANEL
ALT: 39 U/L (ref 0–53)
AST: 41 U/L — ABNORMAL HIGH (ref 0–37)
Albumin: 4.3 g/dL (ref 3.5–5.2)
Alkaline Phosphatase: 50 U/L (ref 39–117)

## 2012-04-16 ENCOUNTER — Encounter: Payer: Self-pay | Admitting: Internal Medicine

## 2012-04-16 ENCOUNTER — Ambulatory Visit (INDEPENDENT_AMBULATORY_CARE_PROVIDER_SITE_OTHER): Payer: BC Managed Care – PPO | Admitting: Internal Medicine

## 2012-04-16 VITALS — BP 110/84 | HR 92 | Ht 65.0 in | Wt 198.2 lb

## 2012-04-16 DIAGNOSIS — R194 Change in bowel habit: Secondary | ICD-10-CM

## 2012-04-16 DIAGNOSIS — K219 Gastro-esophageal reflux disease without esophagitis: Secondary | ICD-10-CM

## 2012-04-16 DIAGNOSIS — K625 Hemorrhage of anus and rectum: Secondary | ICD-10-CM

## 2012-04-16 DIAGNOSIS — Z8619 Personal history of other infectious and parasitic diseases: Secondary | ICD-10-CM

## 2012-04-16 DIAGNOSIS — R198 Other specified symptoms and signs involving the digestive system and abdomen: Secondary | ICD-10-CM

## 2012-04-16 MED ORDER — PEG-KCL-NACL-NASULF-NA ASC-C 100 G PO SOLR: 1.0000 | Freq: Once | ORAL | Status: DC

## 2012-04-16 MED ORDER — FAMOTIDINE 20 MG PO TABS: 20.0000 mg | ORAL_TABLET | Freq: Two times a day (BID) | ORAL | Status: AC | PRN

## 2012-04-16 NOTE — Patient Instructions (Signed)
You have been scheduled for a colonoscopy. Please follow written instructions given to you at your visit today. Please pick up your prep at the pharmacy within the next 2-3 days.  You have been scheduled for a lab: H-pylori stool antigen, to be done in mid-June.  We have sent the following medications to your pharmacy for you to pick up at your convenience: Moviprep

## 2012-04-16 NOTE — Progress Notes (Signed)
Subjective:    Patient ID: Dakota Gutierrez, male    DOB: 1951-02-04, 61 y.o.   MRN: [id number]  HPI Dakota Gutierrez is a 61 yo male with history of nonsustained VT s/p ACID placement and anxiety who is seen in consultation at the request of Dr. Audria Nine for evaluation of rectal bleeding and pain. The patient reports that he has a history of very rare, intermittent bright red rectal bleeding which was occurring 1-2 times a month. However over the last several weeks to a month he has had this more frequently. It had increased about once per week and since last Wednesday it has been daily. He's also noted a change in his bowel habits, previously he was having 2 formed stools a day, and now he is having one formed stool in the morning followed by loose stools after each meal. This does 4-5 bowel movements a day. No nocturnal stools. He notes bright red rectal bleeding with clots associated with his postprandial bowel movements. He notes no pain with passing stool, but he does report significant tenesmus. He also feels some lower, cramping with defecation. No other abdominal pain. No nausea or vomiting. Appetite is "pretty good". No dysphagia. He does report heartburn, previously treated by omeprazole 20 mg daily. He had a nice response with excellent control of his heartburn on omeprazole. However this medication was discontinued and he is now using when necessary famotidine. He does have success with the famotidine, but finds he needs it 5/7 days. He reports being diagnosed with H. pylori by serum antibody, and he completed a Prevpac in early May. PPI was discontinued after this.  No weight loss.  No fevers or chills.   Review of Systems As per history of present illness, otherwise negative  Patient Active Problem List  Diagnoses  . Syncope  . Ventricular tachycardia  . Anxiety  . Dual implantable cardiac defibrillator [employer]  . Obesity  . Other primary cardiomyopathies  . Elevated  transaminase level   Current Outpatient Prescriptions  Medication Sig Dispense Refill  . aspirin EC 81 MG tablet Take 1 tablet (81 mg total) by mouth daily.      . famotidine (PEPCID) 20 MG tablet Take 1 tablet (20 mg total) by mouth 2 (two) times daily as needed.  60 tablet  4  . losartan-hydrochlorothiazide (HYZAAR) 100-12.5 MG per tablet Take 1 tablet by mouth daily. NEEDS OFFICE VISIT FOR MORE  30 tablet  0  . metoprolol (LOPRESSOR) 50 MG tablet Take 1 tablet (50 mg total) by mouth 2 (two) times daily.  180 tablet  3  . Omega-3 Fatty Acids (FISH OIL) 1000 MG CAPS Take 1 capsule by mouth 2 (two) times daily.       . tadalafil (CIALIS) 20 MG tablet Take 20 mg by mouth daily as needed.        . valACYclovir (VALTREX) 1000 MG tablet Take 1 tablet (1,000 mg total) by mouth daily.  30 tablet  5  . DISCONTD: famotidine (PEPCID) 20 MG tablet Take 20 mg by mouth 2 (two) times daily as needed.      . peg 3350 powder (MOVIPREP) 100 G SOLR Take 1 kit (100 g total) by mouth once.  1 kit  0   Allergies  Allergen Reactions  . Adhesive (Tape)    FH - negative for IBD  History  Substance Use Topics  . Smoking status: Never Smoker   . Smokeless tobacco: Never Used  . 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 No distress. HEENT: Normocephalic and atraumatic. Oropharynx is clear and moist. No oropharyngeal exudate. Conjunctivae are normal. Pupils are equal round and reactive to light. No scleral icterus. Neck: Neck supple. Trachea midline. Cardiovascular: Normal rate, regular rhythm and intact distal pulses. No M/R/G, AICD left upper chest Pulmonary/chest: Effort normal and breath sounds normal. No wheezing, rales or rhonchi. Abdominal: Soft, nontender, nondistended. Bowel sounds active  throughout. There are no masses palpable. No hepatosplenomegaly. Extremities: no clubbing, cyanosis, or edema Lymphadenopathy: No cervical adenopathy noted. Neurological: Alert and oriented to person place and time. Skin: Skin is warm and dry. No rashes noted. Psychiatric: Normal mood and affect. Behavior is normal.  CBC    Component Value Date/Time   WBC 6.2 03/23/2012 1424   WBC 6.9 12/17/2009   RBC 5.00 03/23/2012 1424   HGB 14.8 03/23/2012 1424   HGB 16.4 12/17/2009   HCT 44.6 03/23/2012 1424   HCT 47 12/17/2009   PLT 229 12/17/2009   MCV 89.2 03/23/2012 1424   MCH 29.6 03/23/2012 1424   MCHC 33.2 03/23/2012 1424   CMP     Component Value Date/Time   NA 138 01/19/2012 0903   NA 141 12/17/2009   K 3.6 01/19/2012 0903   CL 103 01/19/2012 0903   CO2 27 01/19/2012 0903   GLUCOSE 121* 01/19/2012 0903   BUN 18 01/19/2012 0903   BUN 10 12/17/2009   CREATININE 1.2 01/19/2012 0903   CREATININE 1.0 12/17/2009   CALCIUM 9.0 01/19/2012 0903   PROT 7.4 04/13/2012 1401   ALBUMIN 4.3 04/13/2012 1401   AST 41* 04/13/2012 1401   ALT 39 04/13/2012 1401   ALKPHOS 50 04/13/2012 1401   BILITOT 0.8 04/13/2012 1401   Colonoscopy, 05/27/2004 -- exam to the cecum, good prep. Assessment normal examination.    Assessment & Plan:  61 yo male with history of nonsustained VT s/p ACID placement and anxiety who is seen in consultation at the request of Dr. Audria Nine for evaluation of rectal bleeding and pain  1. Rectal bleeding/change in bowel habits/tenesmus --  The differential of the patient's current symptoms includes inflammatory bowel disease, especially in light of his change in bowel habits with rectal bleeding and tenesmus. Certainly this could be hemorrhoidal disease, but hemorrhoidal disease alone should not cause change in bowel habit nor tenesmus. We discussed further evaluation by colonoscopy, and I've explained the risks and benefits. He is agreeable to proceed. This will be scheduled in the [facility] with propofol  sedation. Further recommendations thereafter.  2. GERD -- the patient does have near daily heartburn, which is relieved with famotidine 20 mg. He reports this seems to be worse after stopping omeprazole, and perhaps this is secondary to rebound phenomenon with gastric elevation it can be seen during PPI therapy. I've explained this may improve, and he can continue to use famotidine 20 mg twice a day when necessary for heartburn. If his symptoms continue to be a daily phenomenon, then I suggest PPI therapy. He understands this recommendation. Finally, he was treated for H. pylori which was diagnosed by serum antibody. He is not currently having signs and symptoms of Helicobacter gastroduodenitis, but does make sense to confirm eradication. We will schedule stool antigen for mid June.

## 2012-04-26 ENCOUNTER — Ambulatory Visit (AMBULATORY_SURGERY_CENTER): Payer: BC Managed Care – PPO | Admitting: Internal Medicine

## 2012-04-26 ENCOUNTER — Encounter: Payer: Self-pay | Admitting: Internal Medicine

## 2012-04-26 VITALS — BP 150/93 | HR 78 | Temp 97.4°F | Resp 14 | Ht 65.0 in | Wt 198.0 lb

## 2012-04-26 DIAGNOSIS — D126 Benign neoplasm of colon, unspecified: Secondary | ICD-10-CM

## 2012-04-26 DIAGNOSIS — D1330 Benign neoplasm of unspecified part of small intestine: Secondary | ICD-10-CM

## 2012-04-26 DIAGNOSIS — R198 Other specified symptoms and signs involving the digestive system and abdomen: Secondary | ICD-10-CM

## 2012-04-26 DIAGNOSIS — R197 Diarrhea, unspecified: Secondary | ICD-10-CM

## 2012-04-26 DIAGNOSIS — K625 Hemorrhage of anus and rectum: Secondary | ICD-10-CM

## 2012-04-26 DIAGNOSIS — K635 Polyp of colon: Secondary | ICD-10-CM

## 2012-04-26 MED ORDER — HYDROCORTISONE ACE-PRAMOXINE 1-1 % RE FOAM: 1.0000 | Freq: Three times a day (TID) | RECTAL | Status: DC

## 2012-04-26 MED ORDER — SODIUM CHLORIDE 0.9 % IV SOLN: 500.0000 mL | INTRAVENOUS | Status: DC

## 2012-04-26 NOTE — Patient Instructions (Signed)
Discharge instructions given with verbal understanding. Handouts on polyps and hemorrhoids given. Resume previous medications. YOU HAD AN ENDOSCOPIC PROCEDURE TODAY AT THE [facility]: Refer to the procedure report that was given to you for any specific questions about what was found during the examination.  If the procedure report does not answer your questions, please call your gastroenterologist to clarify.  If you requested that your care partner not be given the details of your procedure findings, then the procedure report has been included in a sealed envelope for you to review at your convenience later.  YOU SHOULD EXPECT: Some feelings of bloating in the abdomen. Passage of more gas than usual.  Walking can help get rid of the air that was put into your GI tract during the procedure and reduce the bloating. If you had a lower endoscopy (such as a colonoscopy or flexible sigmoidoscopy) you may notice spotting of blood in your stool or on the toilet paper. If you underwent a bowel prep for your procedure, then you may not have a normal bowel movement for a few days.  DIET: Your first meal following the procedure should be a light meal and then it is ok to progress to your normal diet.  A half-sandwich or bowl of soup is an example of a good first meal.  Heavy or fried foods are harder to digest and may make you feel nauseous or bloated.  Likewise meals heavy in dairy and vegetables can cause extra gas to form and this can also increase the bloating.  Drink plenty of fluids but you should avoid alcoholic beverages for 24 hours.  ACTIVITY: Your care partner should take you home directly after the procedure.  You should plan to take it easy, moving slowly for the rest of the day.  You can resume normal activity the day after the procedure however you should NOT DRIVE or use heavy machinery for 24 hours (because of the sedation medicines used during the test).    SYMPTOMS TO REPORT  IMMEDIATELY: A gastroenterologist can be reached at any hour.  During normal business hours, 8:30 AM to 5:00 PM Monday through Friday, call [phone].  After hours and on weekends, please call the GI answering service at [phone] who will take a message and have the physician on call contact you.   Following lower endoscopy (colonoscopy or flexible sigmoidoscopy):  Excessive amounts of blood in the stool  Significant tenderness or worsening of abdominal pains  Swelling of the abdomen that is new, acute  Fever of 100F or higher FOLLOW UP: If any biopsies were taken you will be contacted by phone or by letter within the next 1-3 weeks.  Call your gastroenterologist if you have not heard about the biopsies in 3 weeks.  Our staff will call the home number listed on your records the next business day following your procedure to check on you and address any questions or concerns that you may have at that time regarding the information given to you following your procedure. This is a courtesy call and so if there is no answer at the home number and we have not heard from you through the emergency physician on call, we will assume that you have returned to your regular daily activities without incident.  SIGNATURES/CONFIDENTIALITY: You and/or your care partner have signed paperwork which will be entered into your electronic medical record.  These signatures attest to the fact that that the information above on your After Visit Summary   has been reviewed and is understood.  Full responsibility of the confidentiality of this discharge information lies with you and/or your care-partner.  

## 2012-04-26 NOTE — Progress Notes (Signed)
Pt states he has had bleeding with every stool this past week- since his visit with Dr. Rhea Belton

## 2012-04-26 NOTE — Op Note (Signed)
[facility] [address] [place], [place]  [address]  COLONOSCOPY PROCEDURE REPORT  PATIENT:  Sava, Proby  MR#:  [id number] BIRTHDATE:  09/08/1951, 61 yrs. old  GENDER:  male ENDOSCOPIST:  Carie Caddy. Maxemiliano Riel, MD REF. BY:  Dow Adolph, M.D. PROCEDURE DATE:  04/26/2012 PROCEDURE:  Colonoscopy with multiple cold biopsies, Colon with cold biopsy polypectomy ASA CLASS:  Class III INDICATIONS:  rectal bleeding, change in bowel habits, unexplained diarrhea MEDICATIONS:   MAC sedation, administered by CRNA, propofol (Diprivan) 380 mg IV  DESCRIPTION OF PROCEDURE:   After the risks benefits and alternatives of the procedure were thoroughly explained, informed consent was obtained.  Digital rectal exam was performed and revealed external hemorrhoids.   The LB [id number] endoscope was introduced through the anus and advanced to the terminal ileum which was intubated for a short distance, without limitations.  The quality of the prep was good, using MoviPrep. The instrument was then slowly withdrawn as the colon was fully examined.<<PROCEDUREIMAGES>>  FINDINGS:  A few, shallow ulcers where found in the terminal ileum Multiple biopsies were obtained and sent to pathology.  A 4 mm sessile polyp was found in the descending colon. The polyp was removed using cold biopsy forceps.  The mucosa was mildly erythematous in the rectum with very few isolated shallow ulcerations. Multiple biopsies were obtained and sent to pathology.  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  This was otherwise a normal examination of the colon. Random biopsies were obtained and sent to pathology from the left and right colon. Retroflexed views in the rectum revealed internal and external hemorrhoids.   The scope was then withdrawn  from the cecum and the procedure completed.  COMPLICATIONS:  None ENDOSCOPIC IMPRESSION: 1) A few shallow ulcers in the terminal ileum.   Multiple biopsies obtained. 2) Sessile polyp in the descending colon. Removed and sent to pathology. 3) Erythema in the rectum. Multiple biopsies obtained. 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  Multiple biopsies obtained. 6) Internal and external hemorrhoids  RECOMMENDATIONS: 1) Avoid all NSAIDS for the next 2 weeks. 2) Await pathology results 3) Timing of repeat colonoscopy will be determined by pathology findings. 4) You will receive a letter within 1-2 weeks with the results of your biopsy as well as final recommendations. Please call my office if you have not received a letter after 3 weeks. 5) Trial of proctofoam three times daily for hemorrhoids.  Carie Caddy. Rhea Belton, MD  CC:  The Patient Dow Adolph, MD  n. Rosalie DoctorCarie Caddy. Martie Fulgham at 04/26/2012 11:13 AM  Viviana Simpler, [phone]

## 2012-04-26 NOTE — Progress Notes (Signed)
Patient did not experience any of the following events: a burn prior to discharge; a fall within the facility; wrong site/side/patient/procedure/implant event; or a hospital transfer or hospital admission upon discharge from the facility. (G8907) Patient did not have preoperative order for IV antibiotic SSI prophylaxis. (G8918)  

## 2012-04-26 NOTE — Progress Notes (Signed)
The pt tolerated the colonoscopy very well. Maw   

## 2012-04-27 ENCOUNTER — Telehealth: Payer: Self-pay | Admitting: *Deleted

## 2012-04-27 NOTE — Telephone Encounter (Signed)
  Follow up Call-  Call back number 04/26/2012  Post procedure Call Back phone  # [phone]  Permission to leave phone message Yes     Patient questions:  Do you have a fever, pain , or abdominal swelling? no Pain Score  0 *  Have you tolerated food without any problems? yes  Have you been able to return to your normal activities? yes  Do you have any questions about your discharge instructions: Diet   no Medications  no Follow up visit  no  Do you have questions or concerns about your Care? no  Actions: * If pain score is 4 or above: No action needed, pain <4.

## 2012-05-04 ENCOUNTER — Encounter: Payer: Self-pay | Admitting: Internal Medicine

## 2012-05-04 ENCOUNTER — Ambulatory Visit: Payer: BC Managed Care – PPO | Admitting: *Deleted

## 2012-05-04 ENCOUNTER — Other Ambulatory Visit: Payer: Self-pay | Admitting: Internal Medicine

## 2012-05-08 ENCOUNTER — Encounter: Payer: Self-pay | Admitting: Cardiology

## 2012-05-08 ENCOUNTER — Ambulatory Visit (INDEPENDENT_AMBULATORY_CARE_PROVIDER_SITE_OTHER): Payer: BC Managed Care – PPO | Admitting: Cardiology

## 2012-05-08 VITALS — BP 105/71 | HR 93 | Ht 65.0 in | Wt 194.1 lb

## 2012-05-08 DIAGNOSIS — I472 Ventricular tachycardia, unspecified: Secondary | ICD-10-CM

## 2012-05-08 DIAGNOSIS — Z95810 Presence of automatic (implantable) cardiac defibrillator: Secondary | ICD-10-CM

## 2012-05-08 DIAGNOSIS — R7401 Elevation of levels of liver transaminase levels: Secondary | ICD-10-CM

## 2012-05-08 LAB — ICD DEVICE OBSERVATION
AL AMPLITUDE: 1.9 mv
AL IMPEDENCE ICD: 399 Ohm
BAMS-0001: 170 {beats}/min
RV LEAD AMPLITUDE: 13.3 mv
RV LEAD THRESHOLD: 0.75 V
TZAT-0002ATACH: NEGATIVE
TZAT-0002ATACH: NEGATIVE
TZAT-0004SLOWVT: 10
TZAT-0005SLOWVT: 81 pct
TZAT-0012FASTVT: 200 ms
TZAT-0012SLOWVT: 200 ms
TZAT-0018ATACH: NEGATIVE
TZAT-0018FASTVT: NEGATIVE
TZAT-0019ATACH: 6 V
TZAT-0019ATACH: 6 V
TZAT-0019FASTVT: 8 V
TZAT-0020ATACH: 1.5 ms
TZAT-0020FASTVT: 1.5 ms
TZAT-0020SLOWVT: 1.5 ms
TZON-0003SLOWVT: 400 ms
TZST-0001ATACH: 5
TZST-0001FASTVT: 2
TZST-0001FASTVT: 6
TZST-0001SLOWVT: 4
TZST-0002ATACH: NEGATIVE
TZST-0002ATACH: NEGATIVE
TZST-0002FASTVT: NEGATIVE
TZST-0002FASTVT: NEGATIVE
TZST-0002FASTVT: NEGATIVE
TZST-0003SLOWVT: 15 J
TZST-0003SLOWVT: 35 J
TZST-0003SLOWVT: 35 J

## 2012-05-08 MED ORDER — AMIODARONE HCL 200 MG PO TABS: ORAL_TABLET | ORAL | Status: DC

## 2012-05-08 NOTE — Patient Instructions (Signed)
Take Amiodarone 200mg  tablets.  Take 2 tablets twice daily for 2 weeks then 2 tablets daily for 4 weeks then 1 tablet daily.  Send a [facility] transmission on June 18, 2012.  Your physician recommends that you schedule a follow-up appointment in: 3 months with Dr Graciela Husbands.

## 2012-05-08 NOTE — Progress Notes (Signed)
ELECTROPHYSIOLOGY CONSULT NOTE  Patient ID: Dakota Gutierrez MRN: [id number], DOB/AGE: [birth year]   Date of Visit: 05/08/2012  Primary Physician: Dr.  Dow Adolph Primary Cardiologist: Dr. Berton Mount Reason for Visit: 63-month device follow-up  History of Present Illness Mr. Dakota Gutierrez is a pleasant 61 year old man with history of polymorphic VT s/p ICD implantation who presents today for 63-month follow-up. He was evaluated by Dr. Graciela Husbands in March at which time his transaminases were elevated and his amiodarone was discontinued. Since that time, he reports occasional dizziness but no syncope. He denies chest pain, shortness of breath or palpitations. He denies ICD shocks.   Past Medical History  Diagnosis Date  . Syncope   . Ventricular tachycardia     storm following ICD implant on amiodarone   . HTN (hypertension)   . Dual implantable cardiac defibrillator in situ     Medtronic D314DRG Protecta  . 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 Elevated transaminase level     10/2011  ? amio  . Other primary cardiomyopathies     Past Surgical History  Procedure Date  . Cardiac defibrillator placement     Medtronic D314DRG Protecta  . Tonsillectomy and adenoidectomy   . Gallbladder surgery   . Hernia repair   . Cholecystectomy     Allergies/Intolerances Allergen Reactions  . Adhesive (Tape)     Swelling and welts formed   Current Home Medications Medication Sig Dispense Refill  . aspirin EC 81 MG tablet Take 1 tablet (81 mg total) by mouth daily.      . famotidine (PEPCID) 20 MG tablet Take 1 tablet (20 mg total) by mouth 2 (two) times daily as needed.  60 tablet  4  . losartan-hydrochlorothiazide (HYZAAR) 100-12.5 MG per tablet Take 1 tablet by mouth daily. NEEDS OFFICE VISIT FOR MORE  30 tablet  0  . metoprolol (LOPRESSOR) 50 MG tablet Take 1 tablet (50 mg total) by mouth 2 (two) times daily.  180 tablet  3  . Omega-3 Fatty Acids (FISH OIL) 1000 MG CAPS Take 1  capsule by mouth 2 (two) times daily.       . tadalafil (CIALIS) 20 MG tablet Take 20 mg by mouth daily as needed.        . valACYclovir (VALTREX) 1000 MG tablet Take 1 tablet (1,000 mg total) by mouth daily.  30 tablet  5   Social History Social History  . Marital Status: Unknown    Spouse Name: N/A    Number of Children: N/A  . Years of Education: N/A   Occupational History  . Retired Runner, broadcasting/film/video    Social History Main Topics  . Smoking status: Never Smoker   . Smokeless tobacco: Never Used  . Alcohol Use: 1.2 oz/week    2 Cans of beer per week  . Drug Use: No  . Sexually Active: Not on file   Review of Systems General:  No chills, fever, night sweats or weight changes Cardiovascular:  No chest pain, dyspnea on exertion, edema, orthopnea, palpitations, paroxysmal nocturnal dyspnea Dermatological: No rash, lesions or masses Respiratory: No cough, dyspnea Urologic: No hematuria, dysuria Abdominal:   No nausea, vomiting, diarrhea, bright red blood per rectum, melena, or hematemesis Neurologic:  No visual changes, weakness, changes in mental status All other systems reviewed and are otherwise negative except as noted above.  Physical Exam Blood pressure 105/71, pulse 93, height 5\' 5"  (1.651 m), weight 194 lb 1.9 oz (88.052 kg).  General: Well developed, well appearing 61 year old male in no acute distress. HEENT: Normocephalic, atraumatic. EOMs intact. Sclera nonicteric. Oropharynx clear.  Neck: Supple without bruits. No JVD. Lungs: Respirations regular and unlabored, CTA bilaterally. No wheezes, rales or rhonchi. Heart: RRR. S1, S2 present. No murmurs, rub, S3 or S4. Abdomen: Soft, non-distended. Extremities: No clubbing, cyanosis or edema. DP/PT/Radials 2+ and equal bilaterally. Psych: Normal affect. Neuro: Alert and oriented X 3. Moves all extremities spontaneously.   Diagnostics 12-lead ECG shows an atrial paced rhythm at 77 bpm; QRS 98 ms; QT/QTc 354/400 ms Device  interrogation shows increased VT episodes since March, 2 of which were successfully terminated with ATP; it appears for other episodes therapy was withheld due to SVT-wavelet discriminators; therefore, wavelet was turned off; otherwise, no programming changes made; see [facility] report  Assessment and Plan 1. Ventricular tachycardia - increased since discontinuing amiodarone; LFTs appear stable and have not changed since discontinuing amiodarone; will resume antiarrhythmic drug therapy with amiodarone in hope of suppressing his VT; he will start amiodarone 400 mg PO twice daily x 2 weeks, then 400 mg PO once daily x 4 weeks then 200 mg once daily thereafter; will follow LFTs and ask for GI input if needed; if recurrent VT despite amiodarone or if LFTs rise while on amiodarone, would consider another antiarrhythmic such as sotalol or mexiletine; will perform remote device check via [facility] in 6 weeks and follow-up in office in 3 months unless needed sooner  This plan of care was formulated with Dr. Berton Mount who was in to see the patient with me. Signed, Rick Duff, PA-C 05/08/2012, 12:46 PM

## 2012-05-09 NOTE — Assessment & Plan Note (Signed)
This has persisted as elevated  But the level is stable and <60  Given the frequency of recurrent VT since its discontinuation, I will resume it and follow LFTs  Pt and family are in agreement

## 2012-05-09 NOTE — Addendum Note (Signed)
Addended by: Reine Just on: 05/09/2012 03:34 PM   Modules accepted: Orders

## 2012-05-09 NOTE — Addendum Note (Signed)
Addended by: Duke Salvia on: 05/09/2012 06:17 PM   Modules accepted: Level of Service

## 2012-05-09 NOTE — Assessment & Plan Note (Signed)
As above.

## 2012-05-18 ENCOUNTER — Encounter: Payer: Self-pay | Admitting: Internal Medicine

## 2012-05-21 ENCOUNTER — Encounter: Payer: Self-pay | Admitting: Internal Medicine

## 2012-05-21 ENCOUNTER — Ambulatory Visit (INDEPENDENT_AMBULATORY_CARE_PROVIDER_SITE_OTHER): Payer: BC Managed Care – PPO | Admitting: Internal Medicine

## 2012-05-21 VITALS — BP 114/66 | HR 60 | Ht 65.0 in | Wt 193.6 lb

## 2012-05-21 DIAGNOSIS — R198 Other specified symptoms and signs involving the digestive system and abdomen: Secondary | ICD-10-CM

## 2012-05-21 DIAGNOSIS — K625 Hemorrhage of anus and rectum: Secondary | ICD-10-CM

## 2012-05-21 DIAGNOSIS — K648 Other hemorrhoids: Secondary | ICD-10-CM

## 2012-05-21 DIAGNOSIS — R7989 Other specified abnormal findings of blood chemistry: Secondary | ICD-10-CM

## 2012-05-21 MED ORDER — HYDROCORTISONE ACE-PRAMOXINE 2.5-1 % RE CREA: TOPICAL_CREAM | Freq: Three times a day (TID) | RECTAL | Status: AC

## 2012-05-21 MED ORDER — MESALAMINE 1000 MG RE SUPP: 1000.0000 mg | Freq: Every day | RECTAL | Status: DC

## 2012-05-21 MED ORDER — LOPERAMIDE HCL 2 MG PO CAPS: 2.0000 mg | ORAL_CAPSULE | Freq: Every day | ORAL | Status: AC

## 2012-05-21 NOTE — Patient Instructions (Addendum)
We have sent  medications to your pharmacy for you to pick up at your convenience.  You were given samples canasa, take daily after loose stools.  Follow up with Dr. Rhea Belton in office in 6 weeks

## 2012-05-21 NOTE — Progress Notes (Signed)
Subjective:    Patient ID: Dakota Gutierrez, male    DOB: 1951/10/06, 61 y.o.   MRN: [id number]  HPI Dakota Gutierrez is a 61 yo male with PMH of nonsustained V. tach status post AICD placement, anxiety, GERD, and hemorrhoidal disease who seen in followup. He underwent colonoscopy on 04/26/2012 for further evaluation of his rectal bleeding and change in bowel habits. This revealed a few shallow ulcerations in his terminal ileum, a sessile polyp in his descending colon, and mild erythema in the rectum. Biopsies were performed and revealed a single tubular adenoma without high-grade dysplasia in the descending colon, unremarkable small bowel without active inflammation, and unremarkable colonic mucosa from the rectum without inflammation. He was given a trial of Proctofoam for internal and external hemorrhoids seen during that colonoscopy. He returns today reporting ongoing issues with loose stools and bright red rectal bleeding. He reports a very good response to Proctofoam with resolution of his rectal bleeding and tenesmus during this therapy. Since starting this therapy, he has had return of his bright red rectal bleeding with bowel movement. He also continues to have tenesmus. He reports that his bowel habits have been altered over the last 5-7 years but the rectal bleeding more recently. He reports normal for him is one formed brown stool in the morning followed by subsequent loose stools 3-4 times the remainder of the day. It is with a subsequent loose stools that he experiences tenesmus and the feeling that he needs to strain. It is usually with his subsequent bowel movements that he sees bright red blood mostly on the toilet tissue. Occasionally it is painful when he passes his stool. He otherwise has no abdominal pain. No nausea or vomiting. Heartburn continues to be well controlled with as needed famotidine. No fevers or chills.  Of note, he was recently restarted on amiodarone. This was after  pacemaker/AICD interrogation revealing V. Tach. This medication has been held due to mild elevation in serum transaminases. His AST remained slightly elevated at 41, and thus the amiodarone was restarted and felt likely not to be contributing to his transaminitis. He recalls being told in the past that his liver enzymes were marginally elevated and he was told that they would be monitored. He feels is some workup was done in [place] and he will gather these records for our review. He does not recall having liver imaging or ultrasound.  Review of Systems As per HPI  Current Medications, Allergies, Past Medical History, Past Surgical History, Family History and Social History were reviewed in [employer] record.     Objective:   Physical Exam BP 114/66  Pulse 60  Ht 5\' 5"  (1.651 m)  Wt 193 lb 9.6 oz (87.816 kg)  BMI 32.22 kg/m2 Constitutional: Well-developed and well-nourished. No distress. Psychiatric: Normal mood and affect. Behavior is normal.     Assessment & Plan:   61 yo male with PMH of nonsustained V. tach status post AICD placement, anxiety, GERD, and hemorrhoidal disease who seen in followup.  1. Rectal bleeding/hemorrhoids/tenesmus -- his rectal bleeding is felt to be hemorrhoidal in nature, and he did have a very good response to trial Proctofoam therapy. It is slightly atypical is his tenesmus. He does continue to have loose stools throughout the day, and perhaps if we can make this better, then he will have less bleeding and no need for straining at stool. First he will give a trial of a lactose-free diet to see if this helps with his subsequent  loose stools throughout the day. I would like him to start Analpram twice daily for his hemorrhoids. If there is no response to the lactose-free trial, he will use loperamide 2 mg after his first loose stool. Hopefully this will decrease subsequent bowel movements and therefore allow for healing of his hemorrhoids. If he  continues to have tenesmus, I like for him to try Canasa 1000 mg each bedtime. There was no active or chronic inflammation on rectal biopsy, but topical mesalamine may help with his rectal symptoms. He will try these therapies in stepwise fashion so that we can better understand what provides him with relief.  2. Elevated serum transaminases -- on review of his LFTs over the last 6 months, he has had very mild elevation in his serum AST. There was one lab from May 2013 with elevated bilirubin, but this result was hemolyzed and an outlier. He reports having had his liver evaluated previously and he has records of this. I like for him to gather these and bring them for our review. If his transaminitis continues, then further workup can be performed. This would include liver ultrasound, and laboratory testing to exclude other causes of chronic liver inflammation. He feels that some part of this may have already been performed. We can discuss this at followup  3. GERD -- continue when necessary famotidine. We're awaiting stool antigen to confirm Helicobacter pylori eradication  He will return in 6 weeks

## 2012-05-22 ENCOUNTER — Encounter: Payer: BC Managed Care – PPO | Admitting: Internal Medicine

## 2012-06-08 ENCOUNTER — Ambulatory Visit: Payer: BC Managed Care – PPO | Admitting: Internal Medicine

## 2012-06-18 ENCOUNTER — Encounter: Payer: Self-pay | Admitting: Internal Medicine

## 2012-06-18 ENCOUNTER — Ambulatory Visit (INDEPENDENT_AMBULATORY_CARE_PROVIDER_SITE_OTHER): Payer: BC Managed Care – PPO | Admitting: *Deleted

## 2012-06-18 DIAGNOSIS — I428 Other cardiomyopathies: Secondary | ICD-10-CM

## 2012-08-14 ENCOUNTER — Encounter: Payer: Self-pay | Admitting: Internal Medicine

## 2012-08-14 ENCOUNTER — Ambulatory Visit (INDEPENDENT_AMBULATORY_CARE_PROVIDER_SITE_OTHER): Payer: BC Managed Care – PPO | Admitting: Internal Medicine

## 2012-08-14 VITALS — BP 132/92 | HR 97 | Ht 65.0 in | Wt 193.1 lb

## 2012-08-14 DIAGNOSIS — Z95810 Presence of automatic (implantable) cardiac defibrillator: Secondary | ICD-10-CM

## 2012-08-14 DIAGNOSIS — G4710 Hypersomnia, unspecified: Secondary | ICD-10-CM

## 2012-08-14 DIAGNOSIS — R0609 Other forms of dyspnea: Secondary | ICD-10-CM

## 2012-08-14 DIAGNOSIS — R0683 Snoring: Secondary | ICD-10-CM

## 2012-08-14 DIAGNOSIS — I472 Ventricular tachycardia, unspecified: Secondary | ICD-10-CM

## 2012-08-14 DIAGNOSIS — R55 Syncope and collapse: Secondary | ICD-10-CM

## 2012-08-14 LAB — COMPREHENSIVE METABOLIC PANEL
ALT: 51 U/L (ref 0–53)
AST: 55 U/L — ABNORMAL HIGH (ref 0–37)
Albumin: 4.1 g/dL (ref 3.5–5.2)
CO2: 26 mEq/L (ref 19–32)
Calcium: 8.5 mg/dL (ref 8.4–10.5)
Chloride: 96 mEq/L (ref 96–112)
Creatinine, Ser: 1.2 mg/dL (ref 0.4–1.5)
GFR: 66.83 mL/min (ref >60.00)
Potassium: 3.9 mEq/L (ref 3.5–5.1)
Sodium: 137 mEq/L (ref 135–145)
Total Protein: 7.1 g/dL (ref 6.0–8.3)

## 2012-08-14 MED ORDER — AMIODARONE HCL 200 MG PO TABS: 200.0000 mg | ORAL_TABLET | Freq: Every day | ORAL | Status: DC

## 2012-08-14 NOTE — Assessment & Plan Note (Signed)
No intercurrent VT 

## 2012-08-14 NOTE — Assessment & Plan Note (Signed)
With early-morning fatigability his body habitus and daytime somnolence we'll undertake a sleep study

## 2012-08-14 NOTE — Assessment & Plan Note (Signed)
We will recheck his amiodarone surveillance laboratories today follow as elevated AST

## 2012-08-14 NOTE — Patient Instructions (Addendum)
Remote monitoring is used to monitor your Pacemaker of ICD from home. This monitoring reduces the number of office visits required to check your device to one time per year. It allows [name] to keep an eye on the functioning of your device to ensure it is working properly. You are scheduled for a device check from home on November 19, 2012. You may send your transmission at any time that day. If you have a wireless device, the transmission will be sent automatically. After your physician reviews your transmission, you will receive a postcard with your next transmission date.  Your physician wants you to follow-up in:  1 year with Dr Graciela Husbands.  You will receive a reminder letter in the mail two months in advance. If you don't receive a letter, please call our office to schedule the follow-up appointment.  Your physician has recommended that you have a sleep study. This test records several body functions during sleep, including: brain activity, eye movement, oxygen and carbon dioxide blood levels, heart rate and rhythm, breathing rate and rhythm, the flow of air through your mouth and nose, snoring, body muscle movements, and chest and belly movement.  Your physician recommends that you have the following labs today:  CMET & TSH.

## 2012-08-14 NOTE — Assessment & Plan Note (Signed)
The patient's device was interrogated and the information was fully reviewed.  The device was reprogrammed to allow for intrinsic conduction, decrease the rate response for his ADL rate, activate sleep mode we'll keep reprogrammed at 75 beats per minute in hopes that this will suppress ventricular tachycardia

## 2012-08-14 NOTE — Progress Notes (Signed)
Patient Care Team: Maurice March, MD as PCP - General (Family Medicine) Duke Salvia, MD (Cardiology)   HPI  Dakota Gutierrez is a 61 y.o. 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 Unfortunately, in the days that followed he developed VT storm with multiple shocks and was treated with amiodarone which has been gradually down titrated to 100 mg a day.    Amiodarone surveillance had demonstrated elevated liver function tests last spring. We elected to discontinue the amiodarone albeit with some reluctance on the part of the family. When seen in June of recurrent episodes of ventricular tachycardia 2 of  which required therapy.  He was then resumed on amiodarone and referred for consultation with GI  most recent transaminases were made 2013 AST was 41  Exercise tolerance has been okay at the gym; less good at home.  He has daytime somnolence and Early morning fatigue and nocturnal obstructive breathing patterns.      Past Medical History  Diagnosis Date  . Syncope   . Ventricular tachycardia     storm following ICD implant on amiodarone   . HTN (hypertension)   . Dual implantable cardiac defibrillator in situ     Medtronic D314DRG Protecta  . 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 Elevated transaminase level     10/2011  ? amio  . Other primary cardiomyopathies     Past Surgical History  Procedure Date  . Cardiac defibrillator placement     Medtronic D314DRG Protecta  . Tonsillectomy and adenoidectomy   . Gallbladder surgery   . Hernia repair   . Cholecystectomy     Current Outpatient Prescriptions  Medication Sig Dispense Refill  . amiodarone (PACERONE) 200 MG tablet Take 2 tablets twice daily for 2 weeks, then 2 tablets daily for 4 weeks, then 1 tablet daily.  90 tablet  0  . aspirin EC 81 MG tablet Take 1 tablet (81 mg total) by mouth daily.      .  famotidine (PEPCID) 20 MG tablet Take 1 tablet (20 mg total) by mouth 2 (two) times daily as needed.  60 tablet  4  . losartan-hydrochlorothiazide (HYZAAR) 100-12.5 MG per tablet Take 1 tablet by mouth daily. NEEDS OFFICE VISIT FOR MORE  30 tablet  0  . metoprolol (LOPRESSOR) 50 MG tablet Take 1 tablet (50 mg total) by mouth 2 (two) times daily.  180 tablet  3  . Omega-3 Fatty Acids (FISH OIL) 1000 MG CAPS Take 1 capsule by mouth 2 (two) times daily.       . valACYclovir (VALTREX) 1000 MG tablet Take 1 tablet (1,000 mg total) by mouth daily.  30 tablet  5  . tadalafil (CIALIS) 20 MG tablet Take 20 mg by mouth daily as needed.          Allergies  Allergen Reactions  . Adhesive (Tape)     Swelling and welts formed    Review of Systems negative except from HPI and PMH  Physical Exam BP 132/92  Pulse 97  Ht 5\' 5"  (1.651 m)  Wt 193 lb 1.9 oz (87.599 kg)  BMI 32.14 kg/m2  SpO2 97% Well developed and well nourished in no acute distress HENT normal E scleral and icterus clear Neck Supple JVP flat; carotids brisk and full Clear to ausculation Regular rate and rhythm, no murmurs gallops or rub Soft with active bowel sounds No clubbing cyanosis none  Edema Alert and oriented, grossly normal motor and sensory function Skin Warm and Dry    Assessment and  Plan

## 2012-08-14 NOTE — Assessment & Plan Note (Signed)
There have been 2 intercurrent episodes of treated ventricular tachycardia using ATP. We'll continue amiodarone.

## 2012-08-15 LAB — ICD DEVICE OBSERVATION
AL IMPEDENCE ICD: 437 Ohm
BAMS-0001: 170 {beats}/min
BATTERY VOLTAGE: 3.14 V
RV LEAD IMPEDENCE ICD: 456 Ohm
RV LEAD THRESHOLD: 0.625 V
TZAT-0001FASTVT: 1
TZAT-0002ATACH: NEGATIVE
TZAT-0002ATACH: NEGATIVE
TZAT-0002ATACH: NEGATIVE
TZAT-0004SLOWVT: 10
TZAT-0011SLOWVT: 10 ms
TZAT-0012ATACH: 150 ms
TZAT-0012FASTVT: 200 ms
TZAT-0012SLOWVT: 200 ms
TZAT-0013SLOWVT: 3
TZAT-0018ATACH: NEGATIVE
TZAT-0019ATACH: 6 V
TZAT-0019ATACH: 6 V
TZAT-0019ATACH: 6 V
TZAT-0020ATACH: 1.5 ms
TZAT-0020FASTVT: 1.5 ms
TZON-0003SLOWVT: 400 ms
TZON-0003VSLOWVT: 450 ms
TZON-0004SLOWVT: 28
TZON-0004VSLOWVT: 48
TZST-0001ATACH: 5
TZST-0001FASTVT: 2
TZST-0001FASTVT: 4
TZST-0001FASTVT: 6
TZST-0001SLOWVT: 3
TZST-0001SLOWVT: 5
TZST-0002ATACH: NEGATIVE
TZST-0002ATACH: NEGATIVE
TZST-0002FASTVT: NEGATIVE
TZST-0002FASTVT: NEGATIVE
TZST-0002FASTVT: NEGATIVE
TZST-0003SLOWVT: 15 J
TZST-0003SLOWVT: 35 J
TZST-0003SLOWVT: 35 J

## 2012-08-28 ENCOUNTER — Encounter: Payer: Self-pay | Admitting: *Deleted

## 2012-09-13 ENCOUNTER — Encounter (HOSPITAL_BASED_OUTPATIENT_CLINIC_OR_DEPARTMENT_OTHER): Payer: BC Managed Care – PPO

## 2012-10-02 ENCOUNTER — Encounter (HOSPITAL_BASED_OUTPATIENT_CLINIC_OR_DEPARTMENT_OTHER): Payer: BC Managed Care – PPO

## 2012-10-10 ENCOUNTER — Other Ambulatory Visit: Payer: Self-pay | Admitting: Family Medicine

## 2012-11-19 ENCOUNTER — Ambulatory Visit (INDEPENDENT_AMBULATORY_CARE_PROVIDER_SITE_OTHER): Payer: BC Managed Care – PPO | Admitting: *Deleted

## 2012-11-19 ENCOUNTER — Encounter: Payer: Self-pay | Admitting: Internal Medicine

## 2012-11-19 DIAGNOSIS — Z95810 Presence of automatic (implantable) cardiac defibrillator: Secondary | ICD-10-CM

## 2012-11-19 DIAGNOSIS — I428 Other cardiomyopathies: Secondary | ICD-10-CM

## 2012-11-26 LAB — REMOTE ICD DEVICE
AL AMPLITUDE: 1.9 mv
AL THRESHOLD: 0.625 V
BAMS-0001: 170 {beats}/min
DEV-0020ICD: NEGATIVE
FVT: 0
PACEART VT: 0
RV LEAD AMPLITUDE: 12.8 mv
RV LEAD IMPEDENCE ICD: 456 Ohm
RV LEAD THRESHOLD: 0.625 V
TZAT-0002ATACH: NEGATIVE
TZAT-0002ATACH: NEGATIVE
TZAT-0004SLOWVT: 10
TZAT-0005SLOWVT: 81 pct
TZAT-0012ATACH: 150 ms
TZAT-0012FASTVT: 200 ms
TZAT-0012SLOWVT: 200 ms
TZAT-0018ATACH: NEGATIVE
TZAT-0018FASTVT: NEGATIVE
TZAT-0019ATACH: 6 V
TZAT-0019ATACH: 6 V
TZAT-0019FASTVT: 8 V
TZAT-0020ATACH: 1.5 ms
TZAT-0020ATACH: 1.5 ms
TZAT-0020FASTVT: 1.5 ms
TZAT-0020SLOWVT: 1.5 ms
TZON-0003SLOWVT: 400 ms
TZST-0001ATACH: 5
TZST-0001FASTVT: 2
TZST-0001FASTVT: 4
TZST-0001FASTVT: 6
TZST-0001SLOWVT: 4
TZST-0002ATACH: NEGATIVE
TZST-0002ATACH: NEGATIVE
TZST-0002FASTVT: NEGATIVE
TZST-0002FASTVT: NEGATIVE
TZST-0002FASTVT: NEGATIVE
TZST-0003SLOWVT: 15 J
TZST-0003SLOWVT: 35 J
TZST-0003SLOWVT: 35 J
VF: 0

## 2012-11-27 ENCOUNTER — Encounter: Payer: Self-pay | Admitting: *Deleted

## 2012-12-04 ENCOUNTER — Encounter: Payer: Self-pay | Admitting: *Deleted

## 2012-12-13 ENCOUNTER — Other Ambulatory Visit: Payer: Self-pay | Admitting: Internal Medicine

## 2013-01-06 ENCOUNTER — Other Ambulatory Visit: Payer: Self-pay | Admitting: Physician Assistant

## 2013-01-07 NOTE — Telephone Encounter (Signed)
Needs office visit.

## 2013-02-06 ENCOUNTER — Other Ambulatory Visit: Payer: Self-pay | Admitting: Physician Assistant

## 2013-02-25 ENCOUNTER — Encounter: Payer: Self-pay | Admitting: Internal Medicine

## 2013-02-25 ENCOUNTER — Other Ambulatory Visit: Payer: Self-pay

## 2013-02-25 ENCOUNTER — Ambulatory Visit (INDEPENDENT_AMBULATORY_CARE_PROVIDER_SITE_OTHER): Payer: BC Managed Care – PPO | Admitting: *Deleted

## 2013-02-25 DIAGNOSIS — I428 Other cardiomyopathies: Secondary | ICD-10-CM

## 2013-02-25 DIAGNOSIS — Z95810 Presence of automatic (implantable) cardiac defibrillator: Secondary | ICD-10-CM

## 2013-03-03 LAB — REMOTE ICD DEVICE
AL IMPEDENCE ICD: 399 Ohm
AL THRESHOLD: 0.625 V
BATTERY VOLTAGE: 3.1253 V
CHARGE TIME: 8.698 s
TOT-0002: 7
TOT-0006: 20120531000000
TZAT-0001ATACH: 1
TZAT-0001ATACH: 2
TZAT-0001ATACH: 3
TZAT-0001FASTVT: 1
TZAT-0001SLOWVT: 1
TZAT-0002ATACH: NEGATIVE
TZAT-0002FASTVT: NEGATIVE
TZAT-0012ATACH: 150 ms
TZAT-0012ATACH: 150 ms
TZAT-0012ATACH: 150 ms
TZAT-0013SLOWVT: 3
TZAT-0018ATACH: NEGATIVE
TZAT-0018ATACH: NEGATIVE
TZAT-0018SLOWVT: NEGATIVE
TZAT-0019ATACH: 6 V
TZAT-0019FASTVT: 8 V
TZAT-0019SLOWVT: 8 V
TZAT-0020ATACH: 1.5 ms
TZAT-0020ATACH: 1.5 ms
TZON-0003ATACH: 350 ms
TZON-0003VSLOWVT: 450 ms
TZON-0004VSLOWVT: 48
TZON-0005SLOWVT: 16
TZST-0001ATACH: 5
TZST-0001FASTVT: 3
TZST-0001FASTVT: 4
TZST-0001FASTVT: 5
TZST-0001SLOWVT: 2
TZST-0001SLOWVT: 3
TZST-0001SLOWVT: 6
TZST-0002ATACH: NEGATIVE
TZST-0002ATACH: NEGATIVE
TZST-0002FASTVT: NEGATIVE
TZST-0002FASTVT: NEGATIVE
TZST-0003SLOWVT: 35 J
TZST-0003SLOWVT: 35 J
VENTRICULAR PACING ICD: 12.67 pct

## 2013-03-06 ENCOUNTER — Other Ambulatory Visit: Payer: Self-pay | Admitting: Physician Assistant

## 2013-03-14 ENCOUNTER — Encounter: Payer: Self-pay | Admitting: *Deleted

## 2013-03-21 ENCOUNTER — Encounter: Payer: Self-pay | Admitting: Family Medicine

## 2013-03-21 ENCOUNTER — Ambulatory Visit (INDEPENDENT_AMBULATORY_CARE_PROVIDER_SITE_OTHER): Payer: BC Managed Care – PPO | Admitting: Family Medicine

## 2013-03-21 VITALS — BP 111/75 | HR 101 | Temp 97.9°F | Resp 16 | Ht 64.5 in | Wt 192.0 lb

## 2013-03-21 DIAGNOSIS — Z23 Encounter for immunization: Secondary | ICD-10-CM

## 2013-03-21 DIAGNOSIS — L989 Disorder of the skin and subcutaneous tissue, unspecified: Secondary | ICD-10-CM

## 2013-03-21 DIAGNOSIS — Z760 Encounter for issue of repeat prescription: Secondary | ICD-10-CM

## 2013-03-21 MED ORDER — VALACYCLOVIR HCL 1 G PO TABS: 1000.0000 mg | ORAL_TABLET | Freq: Every day | ORAL | Status: DC

## 2013-03-21 MED ORDER — TADALAFIL 20 MG PO TABS: 20.0000 mg | ORAL_TABLET | Freq: Every day | ORAL | Status: DC | PRN

## 2013-03-21 MED ORDER — LOSARTAN POTASSIUM-HCTZ 100-12.5 MG PO TABS: ORAL_TABLET | ORAL | Status: DC

## 2013-03-21 NOTE — Progress Notes (Signed)
S:  This 62 y.o. Cauc male is here for medication refills; he has HTN and ICD implant followed by Dr. Sherryl Manges, now at yearly intervals. Maintaining weight loss through the winter months was difficult, especially with winter storms. Otherwise, he feels well and has no cardiac, pulmonary or neurologic symptoms. 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  He has a lesion on lateral R eyebrow that appeared within last 6 months. He finds himself picking at it and it bleeds in the middle of the night. He treats it w/ antibiotic ointment and would like to have it removed.  PMHx, Soc Hx and Fam Hx reviewed.  ROS: As per HPI.  O:  Filed Vitals:   03/21/13 1635  BP: 111/75  Pulse: 101  Temp: 97.9 F (36.6 C)  Resp: 16   GEN: In NAD: WN,WD. HENT: [place]/AT; EOMI w/ clear conj/ sclerae. EACs/ nose normal. oroph clear and moist. SKIN: W&D; L lateral brow has 1 mm scabbed lesion where hair is missing. No signs of infection. COR: Sinus tachycardia. NEURO: A&O x 3; CNs intact. Nonfocal.  A/P: Skin lesion of face - Plan: Ambulatory referral to Dermatology  Need for Tdap vaccination - Plan: Tdap vaccine greater than or equal to 7yo IM  Issue of repeat prescriptions  Meds ordered this encounter  Medications  . DISCONTD: valACYclovir (VALTREX) 1000 MG tablet    Sig: Take 1,000 mg by mouth daily.  Marland Kitchen losartan-hydrochlorothiazide (HYZAAR) 100-12.5 MG per tablet    Sig: TAKE 1 TABLET BY MOUTH DAILY.    Dispense:  90 tablet    Refill:  3    PATIENT DOES NOT NEED MEDICATION REFILL UNTIL Apr 10, 2013.  . valACYclovir (VALTREX) 1000 MG tablet    Sig: Take 1 tablet (1,000 mg total) by mouth daily.    Dispense:  30 tablet    Refill:  11    PT DOES NOT NEED MEDICATION REFILL UNTIL Apr 10, 2013.  . tadalafil (CIALIS) 20 MG tablet    Sig: Take 1 tablet (20 mg total) by mouth daily as needed.    Dispense:  10 tablet    Refill:  11   PT DOES NOT NEED MEDICATION REFILL UNTIL Apr 10, 2013.

## 2013-04-24 ENCOUNTER — Other Ambulatory Visit: Payer: Self-pay | Admitting: Dermatology

## 2013-06-01 ENCOUNTER — Encounter: Payer: Self-pay | Admitting: Internal Medicine

## 2013-06-03 ENCOUNTER — Ambulatory Visit (INDEPENDENT_AMBULATORY_CARE_PROVIDER_SITE_OTHER): Payer: BC Managed Care – PPO | Admitting: *Deleted

## 2013-06-03 DIAGNOSIS — I428 Other cardiomyopathies: Secondary | ICD-10-CM

## 2013-06-03 DIAGNOSIS — Z95810 Presence of automatic (implantable) cardiac defibrillator: Secondary | ICD-10-CM

## 2013-06-04 LAB — REMOTE ICD DEVICE
AL IMPEDENCE ICD: 399 Ohm
AL THRESHOLD: 0.625 V
BAMS-0001: 170 {beats}/min
BATTERY VOLTAGE: 3.0912 V
DEV-0020ICD: NEGATIVE
FVT: 0
RV LEAD THRESHOLD: 0.625 V
TOT-0006: 20120531000000
TZAT-0001ATACH: 1
TZAT-0001ATACH: 2
TZAT-0001ATACH: 3
TZAT-0002ATACH: NEGATIVE
TZAT-0004SLOWVT: 10
TZAT-0005SLOWVT: 81 pct
TZAT-0011SLOWVT: 10 ms
TZAT-0012ATACH: 150 ms
TZAT-0012FASTVT: 200 ms
TZAT-0018ATACH: NEGATIVE
TZAT-0018ATACH: NEGATIVE
TZAT-0018ATACH: NEGATIVE
TZAT-0019ATACH: 6 V
TZAT-0019FASTVT: 8 V
TZAT-0020FASTVT: 1.5 ms
TZON-0004VSLOWVT: 48
TZST-0001ATACH: 5
TZST-0001ATACH: 6
TZST-0001FASTVT: 2
TZST-0001FASTVT: 5
TZST-0001SLOWVT: 3
TZST-0001SLOWVT: 5
TZST-0001SLOWVT: 6
TZST-0002ATACH: NEGATIVE
TZST-0002ATACH: NEGATIVE
TZST-0002ATACH: NEGATIVE
TZST-0002FASTVT: NEGATIVE
TZST-0002FASTVT: NEGATIVE
TZST-0003SLOWVT: 15 J
TZST-0003SLOWVT: 35 J
VENTRICULAR PACING ICD: 13.62 pct
VF: 0

## 2013-06-25 ENCOUNTER — Encounter: Payer: Self-pay | Admitting: *Deleted

## 2013-08-06 ENCOUNTER — Ambulatory Visit (INDEPENDENT_AMBULATORY_CARE_PROVIDER_SITE_OTHER): Payer: BC Managed Care – PPO | Admitting: Internal Medicine

## 2013-08-06 ENCOUNTER — Encounter: Payer: Self-pay | Admitting: Internal Medicine

## 2013-08-06 VITALS — BP 119/80 | HR 70 | Ht 65.0 in | Wt 202.1 lb

## 2013-08-06 DIAGNOSIS — R7401 Elevation of levels of liver transaminase levels: Secondary | ICD-10-CM

## 2013-08-06 DIAGNOSIS — Z95810 Presence of automatic (implantable) cardiac defibrillator: Secondary | ICD-10-CM

## 2013-08-06 DIAGNOSIS — I428 Other cardiomyopathies: Secondary | ICD-10-CM

## 2013-08-06 DIAGNOSIS — R55 Syncope and collapse: Secondary | ICD-10-CM

## 2013-08-06 DIAGNOSIS — I472 Ventricular tachycardia, unspecified: Secondary | ICD-10-CM

## 2013-08-06 LAB — ICD DEVICE OBSERVATION
AL AMPLITUDE: 2.375 mv
ATRIAL PACING ICD: 89.55 pct
BAMS-0001: 170 {beats}/min
CHARGE TIME: 9.078 s
FVT: 0
RV LEAD IMPEDENCE ICD: 456 Ohm
TOT-0001: 6
TOT-0002: 7
TZAT-0001ATACH: 2
TZAT-0001FASTVT: 1
TZAT-0002ATACH: NEGATIVE
TZAT-0002ATACH: NEGATIVE
TZAT-0002FASTVT: NEGATIVE
TZAT-0012ATACH: 150 ms
TZAT-0012FASTVT: 200 ms
TZAT-0012SLOWVT: 200 ms
TZAT-0013SLOWVT: 3
TZAT-0018ATACH: NEGATIVE
TZAT-0018SLOWVT: NEGATIVE
TZAT-0019ATACH: 6 V
TZAT-0019ATACH: 6 V
TZAT-0019ATACH: 6 V
TZAT-0019SLOWVT: 8 V
TZAT-0020ATACH: 1.5 ms
TZAT-0020ATACH: 1.5 ms
TZAT-0020SLOWVT: 1.5 ms
TZON-0003SLOWVT: 400 ms
TZON-0004SLOWVT: 32
TZST-0001FASTVT: 2
TZST-0001FASTVT: 4
TZST-0001SLOWVT: 4
TZST-0001SLOWVT: 6
TZST-0002FASTVT: NEGATIVE
TZST-0002FASTVT: NEGATIVE
TZST-0003SLOWVT: 35 J
TZST-0003SLOWVT: 35 J

## 2013-08-06 LAB — HEPATIC FUNCTION PANEL
Albumin: 4.2 g/dL (ref 3.5–5.2)
Total Protein: 7 g/dL (ref 6.0–8.3)

## 2013-08-06 LAB — CHOLESTEROL, TOTAL: Cholesterol: 171 mg/dL (ref 0–200)

## 2013-08-06 MED ORDER — METOPROLOL TARTRATE 50 MG PO TABS: 50.0000 mg | ORAL_TABLET | Freq: Two times a day (BID) | ORAL | Status: DC

## 2013-08-06 MED ORDER — AMIODARONE HCL 200 MG PO TABS: 200.0000 mg | ORAL_TABLET | Freq: Every day | ORAL | Status: DC

## 2013-08-06 NOTE — Assessment & Plan Note (Signed)
Will recheck LFTs

## 2013-08-06 NOTE — Assessment & Plan Note (Signed)
No recurrent syncope 

## 2013-08-06 NOTE — Assessment & Plan Note (Signed)
The patient's device was interrogated.  The information was reviewed. No changes were made in the programming.   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

## 2013-08-06 NOTE — Patient Instructions (Addendum)
Your physician recommends that you have lab work today: TSH, LFT, DIrect LDL, Lipid profile  Your physician wants you to follow-up in: 6 months with Dr. Graciela Husbands.  You will receive a reminder letter in the mail two months in advance. If you don't receive a letter, please call our office to schedule the follow-up appointment.  Your physician recommends that you continue on your current medications as directed. Please refer to the Current Medication list given to you today.

## 2013-08-06 NOTE — Progress Notes (Signed)
Patient Care Team: Maurice March, MD as PCP - General (Family Medicine) Duke Salvia, MD (Cardiology)   HPI  Dakota Gutierrez is a 62 y.o. 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 Unfortunately, in the days that followed he developed VT storm with multiple shocks and was treated with amiodarone which has been gradually down titrated to 100 mg a day.    Amiodarone surveillance had demonstrated elevated liver function tests last spring. We elected to discontinue the amiodarone albeit with some reluctance on the part of the family. When seen in June of recurrent episodes of ventricular tachycardia 2 of  which required therapy.  He was then resumed on amiodarone and referred for consultation with GI  most recent transaminases were made 2013 AST was 55  Exercise tolerance has been okay at the gym; less good at home.  He has daytime somnolence and Early morning fatigue and nocturnal obstructive breathing patterns.  He thinks that he has less morning fatigue since we have reprogrammed his device and added sleep mode  He stopped work in May.   Myoview 2012-no ischemia ejection fraction 56%    Past Medical History  Diagnosis Date  . Syncope   . Ventricular tachycardia     storm following ICD implant on amiodarone   . HTN (hypertension)   . Dual implantable cardiac defibrillator in situ     Medtronic D314DRG Protecta  . 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 Elevated transaminase level     10/2011  ? amio  . Other primary cardiomyopathies     Past Surgical History  Procedure Laterality Date  . Cardiac defibrillator placement      Medtronic D314DRG Protecta  . Tonsillectomy and adenoidectomy    . Gallbladder surgery    . Hernia repair    . Cholecystectomy      Current Outpatient Prescriptions  Medication Sig Dispense Refill  . amiodarone (PACERONE) 200 MG tablet Take  1 tablet (200 mg total) by mouth daily.  90 tablet  3  . aspirin EC 81 MG tablet Take 1 tablet (81 mg total) by mouth daily.      . famotidine (PEPCID) 20 MG tablet Take 1 tablet (20 mg total) by mouth 2 (two) times daily as needed.  60 tablet  4  . losartan-hydrochlorothiazide (HYZAAR) 100-12.5 MG per tablet TAKE 1 TABLET BY MOUTH DAILY.  90 tablet  3  . metoprolol (LOPRESSOR) 50 MG tablet TAKE ONE TABLET BY MOUTH TWICE DAILY  180 tablet  2  . Omega-3 Fatty Acids (FISH OIL) 1000 MG CAPS Take 1 capsule by mouth 2 (two) times daily.       . tadalafil (CIALIS) 20 MG tablet Take 1 tablet (20 mg total) by mouth daily as needed.  10 tablet  11  . valACYclovir (VALTREX) 1000 MG tablet Take 1 tablet (1,000 mg total) by mouth daily.  30 tablet  11   No current facility-administered medications for this visit.    Allergies  Allergen Reactions  . Adhesive [Tape]     Swelling and welts formed    Review of Systems negative except from HPI and PMH  Physical Exam BP 119/80  Pulse 70  Ht 5\' 5"  (1.651 m)  Wt 202 lb 2 oz (91.683 kg)  BMI 33.64 kg/m2 Well developed and well nourished in no acute distress HENT normal E scleral and icterus clear Neck Supple JVP flat;  carotids brisk and full Clear to ausculation Regular rate and rhythm, no murmurs gallops or rub Soft with active bowel sounds No clubbing cyanosis none Edema Alert and oriented, grossly normal motor and sensory function Skin Warm and Dry  ECG demonstrates atrial pacing at 80. Interval 17/11/36.  Assessment and  Plan

## 2013-08-06 NOTE — Assessment & Plan Note (Addendum)
Recurrent ATP treated ventricular tachycardia. There is no polymorphic VT.    We'll recheck his amiodarone surveillance laboratories. His AST has remained mildly elevate    multiple episodes of nonsustained ventricular tachycardia which temporally resolved at the time he stopped workingd.

## 2013-08-27 ENCOUNTER — Telehealth: Payer: Self-pay | Admitting: *Deleted

## 2013-08-27 NOTE — Telephone Encounter (Signed)
Message copied by Baird Lyons on Tue Aug 27, 2013 10:41 AM ------      Message from: Alyssha Housh Rad C      Created: Thu Aug 08, 2013  8:20 AM       Please Inform Patient that liver test is abnormal and will require further eval   i think we try first to decrease amio to 100 mg a day                   Thanks - SK       ------

## 2013-08-29 ENCOUNTER — Other Ambulatory Visit: Payer: Self-pay | Admitting: *Deleted

## 2013-08-29 ENCOUNTER — Telehealth: Payer: Self-pay | Admitting: Internal Medicine

## 2013-08-29 MED ORDER — AMIODARONE HCL 200 MG PO TABS: 100.0000 mg | ORAL_TABLET | Freq: Every day | ORAL | Status: DC

## 2013-08-29 NOTE — Telephone Encounter (Signed)
New Problem//follow Up  Returning the call for lab results.

## 2013-08-29 NOTE — Telephone Encounter (Signed)
Reviewed results with patient who verbalized understanding. 

## 2013-09-02 ENCOUNTER — Encounter: Payer: Self-pay | Admitting: Internal Medicine

## 2013-09-19 ENCOUNTER — Encounter: Payer: Self-pay | Admitting: Family Medicine

## 2013-09-19 ENCOUNTER — Ambulatory Visit (INDEPENDENT_AMBULATORY_CARE_PROVIDER_SITE_OTHER): Payer: BC Managed Care – PPO | Admitting: Family Medicine

## 2013-09-19 VITALS — BP 120/74 | HR 85 | Temp 98.6°F | Resp 16 | Ht 64.75 in | Wt 198.8 lb

## 2013-09-19 DIAGNOSIS — N400 Enlarged prostate without lower urinary tract symptoms: Secondary | ICD-10-CM

## 2013-09-19 DIAGNOSIS — Z0000 Encounter for general adult medical examination without abnormal findings: Secondary | ICD-10-CM

## 2013-09-19 DIAGNOSIS — Z23 Encounter for immunization: Secondary | ICD-10-CM

## 2013-09-19 DIAGNOSIS — Z1159 Encounter for screening for other viral diseases: Secondary | ICD-10-CM

## 2013-09-19 DIAGNOSIS — R7989 Other specified abnormal findings of blood chemistry: Secondary | ICD-10-CM

## 2013-09-19 LAB — POCT URINALYSIS DIPSTICK
Bilirubin, UA: NEGATIVE
Glucose, UA: NEGATIVE
Ketones, UA: NEGATIVE
Leukocytes, UA: NEGATIVE
Nitrite, UA: NEGATIVE

## 2013-09-19 LAB — HEPATIC FUNCTION PANEL
Bilirubin, Direct: 0.2 mg/dL (ref 0.0–0.3)
Indirect Bilirubin: 0.6 mg/dL (ref 0.0–0.9)

## 2013-09-19 LAB — IFOBT (OCCULT BLOOD): IFOBT: POSITIVE

## 2013-09-19 MED ORDER — ZOSTER VACCINE LIVE 19400 UNT/0.65ML ~~LOC~~ SOLR: 0.6500 mL | Freq: Once | SUBCUTANEOUS | Status: DC

## 2013-09-19 NOTE — Progress Notes (Signed)
Subjective:    Patient ID: Dakota Gutierrez, male    DOB: March 23, 1951, 62 y.o.   MRN: [id number]  HPI  This 62 y.o. Cauc male is here for CPE. He has no new concerns and advised me that his cardiologist reduced his dose of Amiodarone because he had elevated LFTs. He would like those labs repeated today. He is having some symptoms of prostatism. He reports no adverse effects with current medications.  HCM: IMM- needs Zostavax.           CRS- current (2013- repeat 2018).  Patient Active Problem List   Diagnosis Date Noted  . Obesity 12/29/2011    Priority: Medium  . Hypersomnolence 08/14/2012  . History of Helicobacter pylori infection 04/16/2012  . GERD (gastroesophageal reflux disease) 04/16/2012  . Elevated transaminase level 02/02/2012  . 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 Syncope   . Ventricular tachycardia   . Anxiety     Review of Systems  Constitutional: Negative.   HENT: Negative.   Eyes: Positive for visual disturbance.  Respiratory: Negative.   Cardiovascular: Negative.   Gastrointestinal: Positive for diarrhea.  Endocrine: Negative.   Genitourinary: Positive for urgency.  Musculoskeletal: Negative.   Skin: Negative.   Allergic/Immunologic: Negative.   Neurological: Negative.   Hematological: Negative.   Psychiatric/Behavioral: Negative.        Objective:   Physical Exam  Nursing note and vitals reviewed. Constitutional: He is oriented to person, place, and time. Vital signs are normal. He appears well-developed and well-nourished. No distress.  HENT:  Head: Normocephalic and atraumatic.  Right Ear: Hearing, tympanic membrane, external ear and ear canal normal.  Left Ear: Hearing, tympanic membrane, external ear and ear canal normal.  Nose: Nose normal. No nasal deformity or septal deviation.  Mouth/Throat: Uvula is midline, oropharynx is clear and moist and mucous membranes are normal. No oral lesions. No dental caries.   Eyes: Conjunctivae, EOM and lids are normal. Pupils are equal, round, and reactive to light. No scleral icterus.  Fundoscopic exam:      The right eye shows no AV nicking, no exudate and no papilledema. The right eye shows red reflex.       The left eye shows no AV nicking, no exudate and no papilledema. The left eye shows red reflex.  Periodic vision eval w/ eye care professional.  Neck: Normal range of motion and full passive range of motion without pain. Neck supple. No JVD present. No spinous process tenderness and no muscular tenderness present. Carotid bruit is not present. No mass and no thyromegaly present.  Cardiovascular: Normal rate, regular rhythm, S1 normal, S2 normal, normal heart sounds and normal pulses.   No extrasystoles are present. PMI is not displaced.  Exam reveals no gallop and no friction rub.   No murmur heard. Pulmonary/Chest: Effort normal and breath sounds normal. No respiratory distress.  Abdominal: Soft. Bowel sounds are normal. He exhibits no distension, no abdominal bruit, no pulsatile midline mass and no mass. There is no hepatosplenomegaly. There is no tenderness. There is no guarding and no CVA tenderness.  Obese abdomen; difficult to assess intra-abdominal organs or masses.  Genitourinary: Rectal exam shows external hemorrhoid. Rectal exam shows no fissure, no mass, no tenderness and anal tone normal. Guaiac positive stool. Prostate is enlarged. Prostate is not tender.  Musculoskeletal: Normal range of motion. He exhibits no edema and no tenderness.  Lymphadenopathy:       Head (right side): No submental, no submandibular, no tonsillar, no  posterior auricular and no occipital adenopathy present.       Head (left side): No submental, no submandibular, no tonsillar, no posterior auricular and no occipital adenopathy present.    He has no cervical adenopathy.    He has no axillary adenopathy.       Right: No inguinal and no supraclavicular adenopathy present.        Left: No inguinal and no supraclavicular adenopathy present.  Neurological: He is alert and oriented to person, place, and time. He has normal strength. He is not disoriented. He displays no atrophy. No cranial nerve deficit or sensory deficit. He exhibits normal muscle tone. He displays a negative Romberg sign. Coordination and gait normal.  Reflex Scores:      Tricep reflexes are 1+ on the right side and 1+ on the left side.      Bicep reflexes are 1+ on the right side and 1+ on the left side.      Patellar reflexes are 1+ on the right side and 1+ on the left side. Skin: Skin is warm, dry and intact. No ecchymosis, no lesion and no rash noted. He is not diaphoretic. No cyanosis or erythema. No pallor. Nails show no clubbing.  Psychiatric: He has a normal mood and affect. His speech is normal and behavior is normal. Judgment and thought content normal. Cognition and memory are normal.       Assessment & Plan:  Routine general medical examination at a health care facility - Plan: POCT urinalysis dipstick, IFOBT POC (occult bld, rslt in office), PSA, Vit D  25 hydroxy (rtn osteoporosis monitoring), zoster vaccine live, PF, (ZOSTAVAX) 98119 UNT/0.65ML injection  Elevated liver function tests  Hypertrophy of prostate without urinary obstruction and other lower urinary tract symptoms (LUTS)  Need for hepatitis C screening test - Plan: Hepatic Function Panel, Hepatitis C antibody  Need for prophylactic vaccination and inoculation against influenza - Plan: Flu Vaccine QUAD 36+ mos IM

## 2013-09-19 NOTE — Patient Instructions (Signed)
Keeping you healthy  Get these tests  Blood pressure- Have your blood pressure checked once a year by your healthcare provider.  Normal blood pressure is 120/80  Weight- Have your body mass index (BMI) calculated to screen for obesity.  BMI is a measure of body fat based on height and weight. You can also calculate your own BMI at [email or web address].  Cholesterol- Have your cholesterol checked every year.  Diabetes- Have your blood sugar checked regularly if you have high blood pressure, high cholesterol, have a family history of diabetes or if you are overweight.  Screening for Colon Cancer- Colonoscopy starting at age 25.  Screening may begin sooner depending on your family history and other health conditions. Follow up colonoscopy as directed by your Gastroenterologist.  Screening for Prostate Cancer- Both blood work (PSA) and a rectal exam help screen for Prostate Cancer.  Screening begins at age 44 with African-American men and at age 69 with Caucasian men.  Screening may begin sooner depending on your family history.  Take these medicines  Aspirin- One aspirin daily can help prevent Heart disease and Stroke.  Flu shot- Every fall. This vaccine is current.  Tetanus- Every 10 years. You had Tdap in April 2014.  Zostavax- Once after the age of 34 to prevent Shingles. You received a vaccine prescription today; take this to [place] , [facility] and [employer].  Pneumonia shot- Once after the age of 60; if you are younger than 7, ask your healthcare provider if you need a Pneumonia shot.  Take these steps  Don't smoke- If you do smoke, talk to your doctor about quitting.  For tips on how to quit, go to www.smokefree.gov or call 1-800-QUIT-NOW.  Be physically active- Exercise 5 days a week for at least 30 minutes.  If you are not already physically active start slow and gradually work up to 30 minutes of moderate physical activity.  Examples of moderate activity include  walking briskly, mowing the yard, dancing, swimming, bicycling, etc.  Eat a healthy diet- Eat a variety of healthy food such as fruits, vegetables, low fat milk, low fat cheese, yogurt, lean meant, poultry, fish, beans, tofu, etc. For more information go to www.thenutritionsource.org  Drink alcohol in moderation- Limit alcohol intake to less than two drinks a day. Never drink and drive.  Dentist- Brush and floss twice daily; visit your dentist twice a year.  Depression- Your emotional health is as important as your physical health. If you're feeling down, or losing interest in things you would normally enjoy please talk to your healthcare provider.  Eye exam- Visit your eye doctor every year.  Safe sex- If you may be exposed to a sexually transmitted infection, use a condom.  Seat belts- Seat belts can save your life; always wear one.  Smoke/Carbon Monoxide detectors- These detectors need to be installed on the appropriate level of your home.  Replace batteries at least once a year.  Skin cancer- When out in the sun, cover up and use sunscreen 15 SPF or higher.  Violence- If anyone is threatening you, please tell your healthcare provider.  Living Will/ Health care power of attorney- Speak with your healthcare provider and family.    Benign Prostatic Hypertrophy  The prostate gland is part of the reproductive system of men. A normal prostate is about the size and shape of a walnut. The prostate gland makes a fluid that is mixed with sperm to make semen. This gland surrounds the urethra and is located in  front of the rectum and just below the bladder. The bladder is where urine is stored. The urethra is the tube through which urine passes from the bladder to get out of the body. The prostate grows as a man ages. An enlarged prostate not caused by cancer is called benign prostatic hypertrophy (BPH). This is a common health problem in men over age 47. This condition is a normal part of aging.  An enlarged prostate presses on the urethra. This makes it harder to pass urine. In the early stages of enlargement, the bladder can get by with a narrowed urethra by forcing the urine through. If the problem gets worse, medical or surgical treatment may be required.  This condition should be followed by your caregiver. Longstanding back pressure on the kidneys can cause infection. Back pressure and infection can progress to bladder damage and kidney (renal) failure. If needed, your caregiver may refer you to a specialist in kidney and prostate disease (urologist). CAUSES  The exact cause is not known.  SYMPTOMS   You are not able to completely empty your bladder.  Getting up often during the night to urinate.  Need to urinate frequently during the day.  Difficultly in starting urine flow.  Decrease in size and strength of the urine stream.  Dribbling after urination.  Pain on urination (more common with infection).  Inability to pass your water. This needs immediate treatment. DIAGNOSIS  These tests will help your caregiver understand your problem:  Digital rectal exam (DRE). In a rectal exam, your caregiver checks your prostate by putting a gloved, lubricated finger into the rectum to feel the back of your prostate gland. This exam detects the size of the gland and abnormal lumps or growths.  Urinalysis (exam of the urine). This may include a culture if there is concern about infection.  Prostate Specific Antigen (PSA). This is a blood test used to screen for prostate cancer. It is not used alone for diagnosing prostate cancer.  Rectal ultrasound (sonogram). This test uses sound waves to electronically produce a "picture" of the prostate. It helps examine the prostate gland for cancer. TREATMENT  Mild symptoms may not need treatment. Simple observation and yearly exams may be all that is required. Medications and surgery are options for more severe problems. Your caregiver can help  you make an informed decision for what is best. Two classes of medications are available for relief of prostate symptoms:  Medications that shrink the prostate. This helps relieve symptoms.  Uncommon side effects include problems with sexual function.  Medications to relax the muscle of the prostate. This also relieves the obstruction.  Side effects can include dizziness, fatigue, lightheadedness, and retrograde ejaculation (diminished volume of ejaculate). Several types of surgical treatments are available for relief of prostate symptoms:  Transurethral resection of the prostate (TURP). In this treatment, an instrument is inserted through opening at the tip of the penis. It is used to cut away pieces of the inner core of the prostate. The pieces are removed through the same opening of the penis. This removes the obstruction and helps get rid of the symptoms.  Transurethral incision (TUIP). In this procedure, small cuts are made in the prostate. This lessens the prostates pressure on the urethra.  Transurethral microwave thermotherapy (TUMT). This procedure uses microwaves to create heat. The heat destroys and removes a small amount of prostate tissue.  Transurethral needle ablation (TUNA). This is a procedure that uses radio frequencies to do the same as TUMT.  Interstitial laser coagulation (ILC). This is a procedure that uses a laser to do the same as TUMT and TUNA.  Transurethral electrovaporization (TUVP). This is a procedure that uses electrodes to do the same as the procedures listed above. Regardless of the method of treatment chosen, you and your caregiver will discuss the options. With this knowledge, you along with your caregiver can decide upon the best treatment for you. SEEK MEDICAL CARE IF:   You develop chills, fever of 100.5 F (38.1 C), or night sweats.  There is unexplained back pain.  Symptoms are not helped by medications prescribed.  You develop medication side  effects.  Your urine becomes very dark or has a bad smell. SEEK IMMEDIATE MEDICAL CARE IF:   You are suddenly unable to urinate. This is an emergency. You should be seen immediately.  There are large amounts of blood or clots in the urine.  Your urinary problems become unmanageable.  You develop lightheadedness, severe dizziness, or you feel faint.  You develop moderate to severe low back or flank pain.  You develop chills or fever. Document Released: 11/14/2005 Document Revised: 02/06/2012 Document Reviewed: 08/06/2007 [facility] Patient Information 2014 [place], [place].

## 2013-09-20 ENCOUNTER — Telehealth: Payer: Self-pay

## 2013-09-20 LAB — VITAMIN D 25 HYDROXY (VIT D DEFICIENCY, FRACTURES): Vit D, 25-Hydroxy: 26 ng/mL — ABNORMAL LOW (ref 30–89)

## 2013-09-20 LAB — PSA: PSA: 0.09 ng/mL

## 2013-09-20 NOTE — Telephone Encounter (Signed)
Reprinted AVS and mailed to patient

## 2013-09-20 NOTE — Telephone Encounter (Signed)
Patient was here for appointment he did not to his papers when he left here and cannot sign up for my chart please mail these to his address that is in out system

## 2013-09-24 NOTE — Progress Notes (Signed)
Quick Note:  Please advise pt regarding following labs...  Liver function tests are better. Hepatitis C antibody is negative. Prostate blood test is well within the normal range.  Vitamin D level is a little low; get an over-the-counter supplement Vitamin D3 2000 IU and take 1 capsule daily.  Copy to pt. ______

## 2013-10-15 ENCOUNTER — Telehealth: Payer: Self-pay | Admitting: Internal Medicine

## 2013-10-15 NOTE — Telephone Encounter (Signed)
New problem   Pt stated his lab results should be in Epic. Please call pt if any questions

## 2013-10-16 NOTE — Telephone Encounter (Signed)
Patient wanted to make sure that Dr Graciela Husbands saw his HFP lab results. I will pass along to Dr. Graciela Husbands.

## 2013-11-11 ENCOUNTER — Ambulatory Visit (INDEPENDENT_AMBULATORY_CARE_PROVIDER_SITE_OTHER): Payer: BC Managed Care – PPO | Admitting: *Deleted

## 2013-11-11 DIAGNOSIS — Z95810 Presence of automatic (implantable) cardiac defibrillator: Secondary | ICD-10-CM

## 2013-11-11 DIAGNOSIS — I428 Other cardiomyopathies: Secondary | ICD-10-CM

## 2013-11-11 DIAGNOSIS — I472 Ventricular tachycardia: Secondary | ICD-10-CM

## 2013-11-11 LAB — MDC_IDC_ENUM_SESS_TYPE_REMOTE
Battery Voltage: 3.04 V
Brady Statistic AP VP Percent: 12.18 %
Brady Statistic RA Percent Paced: 99.13 %
Brady Statistic RV Percent Paced: 12.18 %
Lead Channel Impedance Value: 399 Ohm
Lead Channel Pacing Threshold Amplitude: 0.625 V
Lead Channel Pacing Threshold Pulse Width: 0.4 ms
Lead Channel Sensing Intrinsic Amplitude: 2.625 mV
Lead Channel Sensing Intrinsic Amplitude: 9.5 mV
Lead Channel Setting Pacing Amplitude: 2 V
Zone Setting Detection Interval: 320 ms
Zone Setting Detection Interval: 400 ms

## 2013-11-15 ENCOUNTER — Other Ambulatory Visit: Payer: Self-pay | Admitting: Internal Medicine

## 2013-12-11 ENCOUNTER — Encounter: Payer: Self-pay | Admitting: *Deleted

## 2013-12-17 ENCOUNTER — Encounter: Payer: Self-pay | Admitting: Internal Medicine

## 2014-02-04 ENCOUNTER — Encounter: Payer: Self-pay | Admitting: Internal Medicine

## 2014-02-04 ENCOUNTER — Ambulatory Visit (INDEPENDENT_AMBULATORY_CARE_PROVIDER_SITE_OTHER): Payer: BC Managed Care – PPO | Admitting: Internal Medicine

## 2014-02-04 VITALS — BP 121/82 | HR 102 | Ht 65.0 in | Wt 193.5 lb

## 2014-02-04 DIAGNOSIS — Z95810 Presence of automatic (implantable) cardiac defibrillator: Secondary | ICD-10-CM

## 2014-02-04 DIAGNOSIS — I472 Ventricular tachycardia, unspecified: Secondary | ICD-10-CM

## 2014-02-04 DIAGNOSIS — I6350 Cerebral infarction due to unspecified occlusion or stenosis of unspecified cerebral artery: Secondary | ICD-10-CM

## 2014-02-04 DIAGNOSIS — I639 Cerebral infarction, unspecified: Secondary | ICD-10-CM

## 2014-02-04 DIAGNOSIS — I4729 Other ventricular tachycardia: Secondary | ICD-10-CM

## 2014-02-04 LAB — MDC_IDC_ENUM_SESS_TYPE_INCLINIC
Brady Statistic AP VP Percent: 8.78 %
Brady Statistic AP VS Percent: 76.43 %
Brady Statistic AS VP Percent: 0.03 %
Brady Statistic AS VS Percent: 14.76 %
Brady Statistic RA Percent Paced: 85.21 %
HighPow Impedance: 209 Ohm
HighPow Impedance: 380 Ohm
HighPow Impedance: 42 Ohm
HighPow Impedance: 52 Ohm
Lead Channel Impedance Value: 399 Ohm
Lead Channel Impedance Value: 456 Ohm
Lead Channel Pacing Threshold Amplitude: 0.75 V
Lead Channel Pacing Threshold Pulse Width: 0.4 ms
Lead Channel Pacing Threshold Pulse Width: 0.4 ms
Lead Channel Sensing Intrinsic Amplitude: 2.375 mV
Lead Channel Setting Pacing Amplitude: 2 V
Lead Channel Setting Pacing Amplitude: 2.5 V
Lead Channel Setting Sensing Sensitivity: 0.3 mV
MDC IDC MSMT BATTERY VOLTAGE: 3.06 V
MDC IDC MSMT LEADCHNL RA PACING THRESHOLD AMPLITUDE: 0.75 V
MDC IDC MSMT LEADCHNL RV SENSING INTR AMPL: 14 mV
MDC IDC SESS DTM: 20150310142719
MDC IDC SET LEADCHNL RV PACING PULSEWIDTH: 0.4 ms
MDC IDC SET ZONE DETECTION INTERVAL: 400 ms
MDC IDC SET ZONE DETECTION INTERVAL: 450 ms
MDC IDC STAT BRADY RV PERCENT PACED: 8.81 %
Zone Setting Detection Interval: 320 ms
Zone Setting Detection Interval: 350 ms

## 2014-02-04 LAB — HEPATIC FUNCTION PANEL
ALBUMIN: 4.4 g/dL (ref 3.5–5.2)
ALT: 42 U/L (ref 0–53)
AST: 48 U/L — AB (ref 0–37)
Alkaline Phosphatase: 45 U/L (ref 39–117)
BILIRUBIN TOTAL: 0.7 mg/dL (ref 0.3–1.2)
Bilirubin, Direct: 0.1 mg/dL (ref 0.0–0.3)
Total Protein: 7.6 g/dL (ref 6.0–8.3)

## 2014-02-04 LAB — TSH: TSH: 1.89 u[IU]/mL (ref 0.35–5.50)

## 2014-02-04 NOTE — Patient Instructions (Addendum)
Your physician recommends that you continue on your current medications as directed. Please refer to the Current Medication list given to you today.  Your physician recommends that you return for lab work today: TSH/LFT  Your physician has requested that you have a carotid duplex. This test is an ultrasound of the carotid arteries in your neck. It looks at blood flow through these arteries that supply the brain with blood. Allow one hour for this exam. There are no restrictions or special instructions.  Remote monitoring is used to monitor your Pacemaker of ICD from home. This monitoring reduces the number of office visits required to check your device to one time per year. It allows [name] to keep an eye on the functioning of your device to ensure it is working properly. You are scheduled for a device check from home on 05/08/14. You may send your transmission at any time that day. If you have a wireless device, the transmission will be sent automatically. After your physician reviews your transmission, you will receive a postcard with your next transmission date.   Your physician wants you to follow-up in: 6 months with Dr. Caryl Comes. You will receive a reminder letter in the mail two months in advance. If you don't receive a letter, please call our office to schedule the follow-up appointment.

## 2014-02-04 NOTE — Progress Notes (Signed)
Patient Care Team: Barton Fanny, MD as PCP - General (Family Medicine) [name], MD (Cardiology)   HPI  Dakota Gutierrez is a 63 y.o. 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 Unfortunately, in the days that followed he developed VT storm with multiple shocks and was treated with amiodarone which has been gradually down titrated to 100 mg a day.  Amiodarone surveillance had demonstrated elevated liver function tests spring 2013 . We elected to discontinue the amiodarone albeit with some reluctance on the part of the family. When seen in June of recurrent episodes of ventricular tachycardia 2 of which required therapy. 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 Syncope   . Ventricular tachycardia     storm following ICD implant on amiodarone   . HTN (hypertension)   . Dual implantable cardiac defibrillator in situ     Medtronic [id number] Protecta  . 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 Elevated transaminase level     10/2011  ? amio  . Other primary cardiomyopathies     Past Surgical History  Procedure Laterality Date  . Cardiac defibrillator placement      Medtronic [id number] Protecta  . Tonsillectomy and adenoidectomy    . Gallbladder surgery    . Hernia repair    . Cholecystectomy      Current Outpatient Prescriptions  Medication Sig Dispense Refill  . amiodarone (PACERONE) 200 MG tablet Take 0.5 tablets (100 mg total) by mouth daily.  90 tablet  3  . aspirin EC 81 MG tablet Take 1 tablet (81 mg total) by mouth  daily.      . Cholecalciferol (VITAMIN D) 2000 UNITS tablet Take 2,000 Units by mouth daily.      . famotidine (PEPCID) 20 MG tablet Take 1 tablet (20 mg total) by mouth 2 (two) times daily as needed.  60 tablet  4  . losartan-hydrochlorothiazide (HYZAAR) 100-12.5 MG per tablet TAKE 1 TABLET BY MOUTH DAILY.  90 tablet  3  . metoprolol (LOPRESSOR) 50 MG tablet Take 1 tablet (50 mg total) by mouth 2 (two) times daily.  180 tablet  2  . Omega-3 Fatty Acids (FISH OIL) 1000 MG CAPS Take 1 capsule by mouth 2 (two) times daily.       . tadalafil (CIALIS) 20 MG tablet Take 1 tablet (20 mg total) by mouth daily as needed.  10 tablet  11  . valACYclovir (VALTREX) 1000 MG tablet Take 1 tablet (1,000 mg total) by mouth daily.  30 tablet  11   No current facility-administered medications for this visit.    Allergies  Allergen Reactions  . Adhesive [Tape]     Swelling and welts formed    Review of Systems negative except from HPI and PMH  Physical Exam BP 121/82  Pulse 102  Ht 5\' 5"  (1.651 m)  Wt 193 lb 8 oz (87.771 kg)  BMI 32.20 kg/m2 Well developed and well nourished in no acute distress HENT normal E scleral and icterus clear Neck Supple JVP  flat; carotids brisk and full Clear to ausculation  Regular rate and rhythm, no murmurs gallops or rub Soft with active bowel sounds No clubbing cyanosis none Edema Alert and oriented, grossly normal motor and sensory function Skin Warm and Dry    ECG Apacing Assessment and  Plan  VT No intercurrent treated Ventricular tachycardia some nonsustained Amio   ICD Medtronic  The patient's device was interrogated.  The information was reviewed. No changes were made in the programming.    Visual Loss branch artery occlusion  Elevated transaminases  Pts amio labs will be recheck  LFTs have been stable  I spoke with ophthomolgist who felt clearly not the optic neuritis which can be assoc with ammio   Continue amio

## 2014-02-11 ENCOUNTER — Encounter: Payer: Self-pay | Admitting: Cardiovascular Disease

## 2014-02-11 ENCOUNTER — Ambulatory Visit (HOSPITAL_COMMUNITY): Payer: BC Managed Care – PPO | Attending: Cardiovascular Disease | Admitting: Cardiology

## 2014-02-11 DIAGNOSIS — H53129 Transient visual loss, unspecified eye: Secondary | ICD-10-CM | POA: Insufficient documentation

## 2014-02-11 DIAGNOSIS — I6529 Occlusion and stenosis of unspecified carotid artery: Secondary | ICD-10-CM

## 2014-02-11 DIAGNOSIS — I6350 Cerebral infarction due to unspecified occlusion or stenosis of unspecified cerebral artery: Secondary | ICD-10-CM | POA: Insufficient documentation

## 2014-02-11 DIAGNOSIS — I639 Cerebral infarction, unspecified: Secondary | ICD-10-CM

## 2014-02-11 NOTE — Progress Notes (Signed)
Carotid completed 

## 2014-04-15 ENCOUNTER — Other Ambulatory Visit: Payer: Self-pay | Admitting: Family Medicine

## 2014-05-08 ENCOUNTER — Ambulatory Visit (INDEPENDENT_AMBULATORY_CARE_PROVIDER_SITE_OTHER): Payer: Medicare (Managed Care) | Admitting: *Deleted

## 2014-05-08 DIAGNOSIS — Z95810 Presence of automatic (implantable) cardiac defibrillator: Secondary | ICD-10-CM

## 2014-05-08 DIAGNOSIS — I4729 Other ventricular tachycardia: Secondary | ICD-10-CM

## 2014-05-08 DIAGNOSIS — I472 Ventricular tachycardia, unspecified: Secondary | ICD-10-CM

## 2014-05-08 DIAGNOSIS — I428 Other cardiomyopathies: Secondary | ICD-10-CM

## 2014-05-08 NOTE — Progress Notes (Signed)
Remote ICD transmission.   

## 2014-05-16 ENCOUNTER — Ambulatory Visit (INDEPENDENT_AMBULATORY_CARE_PROVIDER_SITE_OTHER): Payer: 59 | Admitting: Family Medicine

## 2014-05-16 ENCOUNTER — Encounter: Payer: Self-pay | Admitting: Family Medicine

## 2014-05-16 VITALS — BP 102/60 | HR 82 | Temp 98.8°F | Resp 16 | Ht 65.25 in | Wt 195.6 lb

## 2014-05-16 DIAGNOSIS — Z760 Encounter for issue of repeat prescription: Secondary | ICD-10-CM

## 2014-05-16 MED ORDER — LOSARTAN POTASSIUM-HCTZ 100-12.5 MG PO TABS: 1.0000 | ORAL_TABLET | Freq: Every day | ORAL | Status: DC

## 2014-05-16 MED ORDER — VALACYCLOVIR HCL 1 G PO TABS: 1000.0000 mg | ORAL_TABLET | Freq: Every day | ORAL | Status: DC

## 2014-05-16 MED ORDER — TADALAFIL 20 MG PO TABS: 20.0000 mg | ORAL_TABLET | Freq: Every day | ORAL | Status: DC | PRN

## 2014-05-16 NOTE — Patient Instructions (Signed)
Make sure your Omega-3 Fish Oil contains DHA + EPA > 500 mg.  Continue all current medications and I will see you in October 2015 for WELCOME to [place] and fasting labs.

## 2014-05-19 ENCOUNTER — Encounter: Payer: Self-pay | Admitting: Family Medicine

## 2014-05-19 NOTE — Progress Notes (Signed)
S:  This 63 y.o. Cauc male has cardiac arrhythmia managed by Dr. Caryl Comes; pt has dual implantable cardioverter-defibrillator (Medtronic) monitored by Cardiology. He has no new concerns; visit today for medication refills.  Pt denies diaphoresis, fatigue, abnormal weigh loss, vision disturbances, CP or tightness, palpitations, SOB or DOE, cough, edema, abd or back pain, myalgias, HA, dizziness, numbness, weakness or syncope.  Pt is now 38 and has signed up for Medicare.  Patient Active Problem List   Diagnosis Date Noted  . Obesity 12/29/2011    Priority: Medium  . Hypersomnolence 08/14/2012  . History of Helicobacter pylori infection 04/16/2012  . GERD (gastroesophageal reflux disease) 04/16/2012  . Elevated transaminase level 02/02/2012  . 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 Syncope   . Ventricular tachycardia   . Anxiety    PMHx, Surg Hx, Soc and Fam Hx reviewed.  MEDICATIONS reconciled.  ROS: As per HPI.  O: Filed Vitals:   05/16/14 1518  BP: 102/60  Pulse: 82  Temp: 98.8 F (37.1 C)  Resp: 16   GEN: In NAD: WN,WD. HENT: [place]/AT. EOMI w/ clear conj/sclerae. Otherwise unremarkable. COR: RRR. LUNGS: Normal resp rate and effort. SKIN: W&D; intact w/o  Diaphoresis or pallor. NEURO: A&O x 3; CNs intact. Nonfocal.  A/P: Issue of repeat prescriptions Meds ordered this encounter  Medications  . losartan-hydrochlorothiazide (HYZAAR) 100-12.5 MG per tablet    Sig: Take 1 tablet by mouth daily.    Dispense:  90 tablet    Refill:  3  . tadalafil (CIALIS) 20 MG tablet    Sig: Take 1 tablet (20 mg total) by mouth daily as needed.    Dispense:  10 tablet    Refill:  11  . valACYclovir (VALTREX) 1000 MG tablet    Sig: Take 1 tablet (1,000 mg total) by mouth daily.    Dispense:  30 tablet    Refill:  11

## 2014-05-20 LAB — MDC_IDC_ENUM_SESS_TYPE_REMOTE
Battery Voltage: 3.01 V
Brady Statistic AP VP Percent: 6.82 %
Brady Statistic AP VS Percent: 79.13 %
Brady Statistic AS VP Percent: 0.03 %
Brady Statistic AS VS Percent: 14.02 %
Date Time Interrogation Session: 20150611083826
HIGH POWER IMPEDANCE MEASURED VALUE: 44 Ohm
HighPow Impedance: 171 Ohm
HighPow Impedance: 323 Ohm
HighPow Impedance: 37 Ohm
Lead Channel Impedance Value: 399 Ohm
Lead Channel Pacing Threshold Amplitude: 0.75 V
Lead Channel Pacing Threshold Amplitude: 0.75 V
Lead Channel Pacing Threshold Pulse Width: 0.4 ms
Lead Channel Pacing Threshold Pulse Width: 0.4 ms
Lead Channel Sensing Intrinsic Amplitude: 10 mV
Lead Channel Sensing Intrinsic Amplitude: 2.375 mV
Lead Channel Sensing Intrinsic Amplitude: 2.375 mV
Lead Channel Setting Pacing Amplitude: 2 V
Lead Channel Setting Pacing Amplitude: 2.5 V
MDC IDC MSMT LEADCHNL RV IMPEDANCE VALUE: 399 Ohm
MDC IDC MSMT LEADCHNL RV SENSING INTR AMPL: 10 mV
MDC IDC SET LEADCHNL RV PACING PULSEWIDTH: 0.4 ms
MDC IDC SET LEADCHNL RV SENSING SENSITIVITY: 0.3 mV
MDC IDC SET ZONE DETECTION INTERVAL: 400 ms
MDC IDC STAT BRADY RA PERCENT PACED: 85.95 %
MDC IDC STAT BRADY RV PERCENT PACED: 6.85 %
Zone Setting Detection Interval: 320 ms
Zone Setting Detection Interval: 350 ms
Zone Setting Detection Interval: 450 ms

## 2014-06-03 ENCOUNTER — Encounter: Payer: Self-pay | Admitting: Cardiology

## 2014-06-05 ENCOUNTER — Encounter: Payer: Self-pay | Admitting: Cardiology

## 2014-06-10 ENCOUNTER — Encounter: Payer: Self-pay | Admitting: Internal Medicine

## 2014-06-30 ENCOUNTER — Other Ambulatory Visit: Payer: Self-pay | Admitting: Internal Medicine

## 2014-07-03 ENCOUNTER — Encounter: Payer: Self-pay | Admitting: Gastroenterology

## 2014-08-06 ENCOUNTER — Encounter: Payer: Self-pay | Admitting: Internal Medicine

## 2014-08-06 ENCOUNTER — Ambulatory Visit (INDEPENDENT_AMBULATORY_CARE_PROVIDER_SITE_OTHER): Payer: 59 | Admitting: Internal Medicine

## 2014-08-06 VITALS — BP 128/82 | HR 96 | Ht 65.0 in | Wt 192.2 lb

## 2014-08-06 DIAGNOSIS — I472 Ventricular tachycardia, unspecified: Secondary | ICD-10-CM

## 2014-08-06 DIAGNOSIS — Z95810 Presence of automatic (implantable) cardiac defibrillator: Secondary | ICD-10-CM

## 2014-08-06 DIAGNOSIS — I4729 Other ventricular tachycardia: Secondary | ICD-10-CM

## 2014-08-06 LAB — MDC_IDC_ENUM_SESS_TYPE_INCLINIC
Brady Statistic AP VP Percent: 5.29 %
Brady Statistic AP VS Percent: 77.94 %
Brady Statistic AS VP Percent: 0.03 %
Brady Statistic AS VS Percent: 16.74 %
Brady Statistic RA Percent Paced: 83.23 %
Brady Statistic RV Percent Paced: 5.32 %
Date Time Interrogation Session: 20150909123138
HIGH POWER IMPEDANCE MEASURED VALUE: 41 Ohm
HighPow Impedance: 209 Ohm
HighPow Impedance: 342 Ohm
HighPow Impedance: 51 Ohm
Lead Channel Impedance Value: 437 Ohm
Lead Channel Pacing Threshold Amplitude: 0.75 V
Lead Channel Pacing Threshold Pulse Width: 0.4 ms
Lead Channel Pacing Threshold Pulse Width: 0.4 ms
Lead Channel Sensing Intrinsic Amplitude: 11.625 mV
Lead Channel Sensing Intrinsic Amplitude: 2.375 mV
Lead Channel Setting Pacing Amplitude: 2 V
Lead Channel Setting Sensing Sensitivity: 0.3 mV
MDC IDC MSMT BATTERY VOLTAGE: 3.02 V
MDC IDC MSMT LEADCHNL RA IMPEDANCE VALUE: 437 Ohm
MDC IDC MSMT LEADCHNL RA PACING THRESHOLD AMPLITUDE: 0.5 V
MDC IDC SET LEADCHNL RV PACING AMPLITUDE: 2.5 V
MDC IDC SET LEADCHNL RV PACING PULSEWIDTH: 0.4 ms
MDC IDC SET ZONE DETECTION INTERVAL: 320 ms
MDC IDC SET ZONE DETECTION INTERVAL: 400 ms
Zone Setting Detection Interval: 350 ms
Zone Setting Detection Interval: 450 ms

## 2014-08-06 NOTE — Patient Instructions (Signed)
Your physician recommends that you continue on your current medications as directed. Please refer to the Current Medication list given to you today.  Remote monitoring is used to monitor your Pacemaker of ICD from home. This monitoring reduces the number of office visits required to check your device to one time per year. It allows [name] to keep an eye on the functioning of your device to ensure it is working properly. You are scheduled for a device check from home on 11/10/14. You may send your transmission at any time that day. If you have a wireless device, the transmission will be sent automatically. After your physician reviews your transmission, you will receive a postcard with your next transmission date.   Your physician wants you to follow-up in: 6 months with Dr. Caryl Comes. You will receive a reminder letter in the mail two months in advance. If you don't receive a letter, please call our office to schedule the follow-up appointment.

## 2014-08-06 NOTE — Progress Notes (Signed)
Patient Care Team: Barton Fanny, MD as PCP - General (Family Medicine) [name], MD (Cardiology)   HPI  Dakota Gutierrez is a 63 y.o. 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 Unfortunately, in the days that followed he developed VT storm with multiple shocks and was treated with amiodarone which has been gradually down titrated to 100 mg a day.  Amiodarone surveillance had demonstrated elevated liver function tests spring 2013 . We elected to discontinue the amiodarone albeit with some reluctance on the part of the family. When seen in June of recurrent episodes of ventricular tachycardia 2 of which required therapy. 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 Syncope   . Ventricular tachycardia     storm following ICD implant on amiodarone   . HTN (hypertension)   . Dual implantable cardiac defibrillator in situ     Medtronic [id number] Protecta  . 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 Elevated transaminase level     10/2011  ? amio  . Other primary cardiomyopathies     Past Surgical History  Procedure Laterality Date  . Cardiac defibrillator placement      Medtronic [id number] Protecta  . Tonsillectomy and adenoidectomy    . Gallbladder surgery    . Hernia repair    . Cholecystectomy      Current Outpatient Prescriptions  Medication Sig Dispense Refill  . amiodarone (PACERONE) 200 MG tablet Take 0.5 tablets (100 mg total) by mouth daily.  90 tablet  3  . aspirin EC 81 MG tablet Take 1 tablet (81 mg total) by mouth  daily.      . Cholecalciferol (VITAMIN D) 2000 UNITS tablet Take 2,000 Units by mouth daily.      . famotidine (PEPCID) 20 MG tablet Take 1 tablet (20 mg total) by mouth 2 (two) times daily as needed.  60 tablet  4  . losartan-hydrochlorothiazide (HYZAAR) 100-12.5 MG per tablet Take 1 tablet by mouth daily.  90 tablet  3  . metoprolol (LOPRESSOR) 50 MG tablet TAKE ONE TABLET BY MOUTH TWICE DAILY  180 tablet  0  . Omega-3 Fatty Acids (FISH OIL) 1000 MG CAPS Take 1 capsule by mouth 2 (two) times daily.       . tadalafil (CIALIS) 20 MG tablet Take 1 tablet (20 mg total) by mouth daily as needed.  10 tablet  11  . valACYclovir (VALTREX) 1000 MG tablet Take 1 tablet (1,000 mg total) by mouth daily.  30 tablet  11   No current facility-administered medications for this visit.    Allergies  Allergen Reactions  . Adhesive [Tape]     Swelling and welts formed    Review of Systems negative except from HPI and PMH  Physical Exam BP 128/82  Pulse 96  Ht 5\' 5"  (1.651 m)  Wt 192 lb 3.2 oz (87.181 kg)  BMI 31.98 kg/m2 Well developed and well nourished in no acute distress HENT normal E scleral and icterus clear Neck Supple JVP flat; carotids brisk and  full Clear to ausculation  Regular rate and rhythm, no murmurs gallops or rub Soft with active bowel sounds No clubbing cyanosis none Edema Alert and oriented, grossly normal motor and sensory function Skin Warm and Dry    ECG Apacing Assessment and  Plan  VT No intercurrent treated Ventricular tachycardia some nonsustained  Amio   ICD Medtronic  The patient's device was interrogated.  The information was reviewed. No changes were made in the programming.    Visual Loss branch artery occlusion  Elevated transaminases-previously normal  Pts amio labs will be rechecked by PCP  He is dealing with sun sensitivity.   Nonsustained ventricular tachycardia episodes  appear to be clustered. He will get his calendar to see if he can sort  out if there any activity associations with these events

## 2014-09-16 ENCOUNTER — Encounter: Payer: 59 | Admitting: Family Medicine

## 2014-09-29 ENCOUNTER — Other Ambulatory Visit: Payer: Self-pay | Admitting: Internal Medicine

## 2014-10-28 ENCOUNTER — Ambulatory Visit (INDEPENDENT_AMBULATORY_CARE_PROVIDER_SITE_OTHER): Payer: Medicare Other | Admitting: Family Medicine

## 2014-10-28 ENCOUNTER — Encounter: Payer: Self-pay | Admitting: Family Medicine

## 2014-10-28 VITALS — BP 110/68 | HR 83 | Temp 98.2°F | Resp 16 | Ht 65.0 in | Wt 190.0 lb

## 2014-10-28 DIAGNOSIS — Z23 Encounter for immunization: Secondary | ICD-10-CM

## 2014-10-28 DIAGNOSIS — M79672 Pain in left foot: Secondary | ICD-10-CM

## 2014-10-28 DIAGNOSIS — Z125 Encounter for screening for malignant neoplasm of prostate: Secondary | ICD-10-CM

## 2014-10-28 DIAGNOSIS — Z026 Encounter for examination for insurance purposes: Secondary | ICD-10-CM

## 2014-10-28 DIAGNOSIS — Z0000 Encounter for general adult medical examination without abnormal findings: Secondary | ICD-10-CM

## 2014-10-28 LAB — COMPLETE METABOLIC PANEL WITHOUT GFR
ALT: 34 U/L (ref 0–53)
AST: 36 U/L (ref 0–37)
Albumin: 4.4 g/dL (ref 3.5–5.2)
Alkaline Phosphatase: 41 U/L (ref 39–117)
BUN: 15 mg/dL (ref 6–23)
CO2: 27 meq/L (ref 19–32)
Calcium: 9.4 mg/dL (ref 8.4–10.5)
Chloride: 101 meq/L (ref 96–112)
Creat: 1.07 mg/dL (ref 0.50–1.35)
GFR, Est African American: 84 mL/min
GFR, Est Non African American: 72 mL/min
Glucose, Bld: 213 mg/dL — ABNORMAL HIGH (ref 70–99)
Potassium: 4.3 meq/L (ref 3.5–5.3)
Sodium: 137 meq/L (ref 135–145)
Total Bilirubin: 0.6 mg/dL (ref 0.2–1.2)
Total Protein: 7.1 g/dL (ref 6.0–8.3)

## 2014-10-28 LAB — LIPID PANEL
CHOLESTEROL: 176 mg/dL (ref 0–200)
HDL: 31 mg/dL — ABNORMAL LOW (ref >39)
Total CHOL/HDL Ratio: 5.7 Ratio
Triglycerides: 451 mg/dL — ABNORMAL HIGH (ref <150)

## 2014-10-28 MED ORDER — VALACYCLOVIR HCL 1 G PO TABS: 1000.0000 mg | ORAL_TABLET | Freq: Every day | ORAL | Status: DC

## 2014-10-28 MED ORDER — LOSARTAN POTASSIUM-HCTZ 100-12.5 MG PO TABS: 1.0000 | ORAL_TABLET | Freq: Every day | ORAL | Status: DC

## 2014-10-28 NOTE — Patient Instructions (Addendum)
Pneumococcal Conjugate Vaccine: What You Need to Know Your doctor recommends that you, or your child, get a dose of PCV13 today. 1. Why get vaccinated? Pneumococcal conjugate vaccine (called PCV13 or Prevnar 13) is recommended to protect infants and toddlers, and some older children and adults with certain health conditions, from pneumococcal disease. Pneumococcal disease is caused by infection with Streptococcus pneumoniae bacteria. These bacteria can spread from person to person through close contact. Pneumococcal disease can lead to severe health problems, including pneumonia, blood infections, and meningitis. Meningitis is an infection of the covering of the brain. Pneumococcal meningitis is fairly rare (less than 1 case per 100,000 people each year), but it leads to other health problems, including deafness and brain damage. In children, it is fatal in about 1 case out of 10. Children younger than two are at higher risk for serious disease than older children. People with certain medical conditions, people over age 39, and cigarette smokers are also at higher risk. Before vaccine, pneumococcal infections caused many problems each year in the [place] in children younger than 5, including: 1. more than 700 cases of meningitis, 2. 13,000 blood infections, 3. about 5 million ear infections, and 4. about 200 deaths. About 4,000 adults still die each year because of pneumococcal infections. Pneumococcal infections can be hard to treat because some strains are resistant to antibiotics. This makes prevention through vaccination even more important. 2. PCV13 vaccine There are more than 90 types of pneumococcal bacteria. PCV13 protects against 13 of them. These 13 strains cause most severe infections in children and about half of infections in adults.  PCV13 is routinely given to children at 2, 4, 6, and 37-40 months of age. Children in this age range are at greatest risk for serious diseases  caused by pneumococcal infection. PCV13 vaccine may also be recommended for some older children or adults. Your doctor can give you details. A second type of pneumococcal vaccine, called PPSV23, may also be given to some children and adults, including anyone over age 69. There is a separate Vaccine Information Statement for this vaccine. 3. Precautions  Anyone who has ever had a life-threatening allergic reaction to a dose of this vaccine, to an earlier pneumococcal vaccine called PCV7 (or Prevnar), or to any vaccine containing diphtheria toxoid (for example, DTaP), should not get PCV13. Anyone with a severe allergy to any component of PCV13 should not get the vaccine. Tell your doctor if the person being vaccinated has any severe allergies. If the person scheduled for vaccination is sick, your doctor might decide to reschedule the shot on another day. Your doctor can give you more information about any of these precautions. 4. What are the risks of PCV13 vaccine?  With any medicine, including vaccines, there is a chance of side effects. These are usually mild and go away on their own, but serious reactions are also possible. Reported problems associated with PCV13 vary by dose and age, but generally: 1. About half of children became drowsy after the shot, had a temporary loss of appetite, or had redness or tenderness where the shot was given. 2. About 1 out of 3 had swelling where the shot was given. 3. About 1 out of 3 had a mild fever, and about 1 in 20 had a higher fever (over 102.3F). 4. Up to about 8 out of 10 became fussy or irritable. Adults receiving the vaccine have reported redness, pain, and swelling where the shot was given. Mild fever, fatigue, headache, chills, or  muscle pain have also been reported. Life-threatening allergic reactions from any vaccine are very rare. 5. What if there is a serious reaction? What should I look for? 1. Look for anything that concerns you, such as  signs of a severe allergic reaction, very high fever, or behavior changes. Signs of a severe allergic reaction can include hives, swelling of the face and throat, difficulty breathing, a fast heartbeat, dizziness, and weakness. These would start a few minutes to a few hours after the vaccination. What should I do?  If you think it is a severe allergic reaction or other emergency that can't wait, call 9-1-1 or get the person to the nearest hospital. Otherwise, call your doctor.  Afterward, the reaction should be reported to the Vaccine Adverse Event Reporting System (VAERS). Your doctor might file this report, or you can do it yourself through the VAERS web site at www.vaers.SamedayNews.es, or by calling (615)144-5134. VAERS is only for reporting reactions. They do not give medical advice. 6. The National Vaccine Injury Compensation Program The [employer] Vaccine Injury Compensation Program (VICP) is a federal program that was created to compensate people who may have been injured by certain vaccines. Persons who believe they may have been injured by a vaccine can learn about the program and about filing a claim by calling 505-199-2723 or visiting the [place] website at GoldCloset.com.ee. 7. How can I learn more?  Ask your doctor.  Call your local or state health department.  Contact the Centers for Disease Control and Prevention (CDC):  Call (984) 376-8755 (1-800-CDC-INFO) or  Visit CDC's website at http://hunter.com/ CDC PCV13 Vaccine VIS (Interim) (01/25/12) Document Released: 09/11/2006 Document Revised: 03/31/2014 Document Reviewed: 01/03/2014 [facility] Patient Information 2015 [place], [place]. This information is not intended to replace advice given to you by your health care provider. Make sure you discuss any questions you have with your health care provider. Influenza Vaccine (Flu Vaccine, Inactivated or Recombinant) 2014-2015: What You Need to Know 1. Why get  vaccinated? Influenza ("flu") is a contagious disease that spreads around the [place] every winter, usually between October and May. Flu is caused by influenza viruses, and is spread mainly by coughing, sneezing, and close contact. Anyone can get flu, but the risk of getting flu is highest among children. Symptoms come on suddenly and may last several days. They can include: 5. fever/chills 6. sore throat 7. muscle aches 8. fatigue 9. cough 10. headache 11. runny or stuffy nose Flu can make some people much sicker than others. These people include young children, people 38 and older, pregnant women, and people with certain health conditions-such as heart, lung or kidney disease, nervous system disorders, or a weakened immune system. Flu vaccination is especially important for these people, and anyone in close contact with them. Flu can also lead to pneumonia, and make existing medical conditions worse. It can cause diarrhea and seizures in children. Each year thousands of people in the [place] die from flu, and many more are hospitalized. Flu vaccine is the best protection against flu and its complications. Flu vaccine also helps prevent spreading flu from person to person. 2. Inactivated and recombinant flu vaccines You are getting an injectable flu vaccine, which is either an "inactivated" or "recombinant" vaccine. These vaccines do not contain any live influenza virus. They are given by injection with a needle, and often called the "flu shot."  A different live, attenuated (weakened) influenza vaccine is sprayed into the nostrils. This vaccine is described in a separate Vaccine Information  Statement. Flu vaccination is recommended every year. Some children 6 months through 40 years of age might need two doses during one year. Flu viruses are always changing. Each year's flu vaccine is made to protect against 3 or 4 viruses that are likely to cause disease that year. Flu vaccine  cannot prevent all cases of flu, but it is the best defense against the disease.  It takes about 2 weeks for protection to develop after the vaccination, and protection lasts several months to a year. Some illnesses that are not caused by influenza virus are often mistaken for flu. Flu vaccine will not prevent these illnesses. It can only prevent influenza. Some inactivated flu vaccine contains a very small amount of a mercury-based preservative called thimerosal. Studies have shown that thimerosal in vaccines is not harmful, but flu vaccines that do not contain a preservative are available. 3. Some people should not get this vaccine Tell the person who gives you the vaccine: 5. If you have any severe, life-threatening allergies. If you ever had a life-threatening allergic reaction after a dose of flu vaccine, or have a severe allergy to any part of this vaccine, including (for example) an allergy to gelatin, antibiotics, or eggs, you may be advised not to get vaccinated. Most, but not all, types of flu vaccine contain a small amount of egg protein. 6. If you ever had Guillain-Barr Syndrome (a severe paralyzing illness, also called GBS). Some people with a history of GBS should not get this vaccine. This should be discussed with your doctor. 7. If you are not feeling well. It is usually okay to get flu vaccine when you have a mild illness, but you might be advised to wait until you feel better. You should come back when you are better. 4. Risks of a vaccine reaction With a vaccine, like any medicine, there is a chance of side effects. These are usually mild and go away on their own. Problems that could happen after any vaccine: 2. Brief fainting spells can happen after any medical procedure, including vaccination. Sitting or lying down for about 15 minutes can help prevent fainting, and injuries caused by a fall. Tell your doctor if you feel dizzy, or have vision changes or ringing in the  ears. 3. Severe shoulder pain and reduced range of motion in the arm where a shot was given can happen, very rarely, after a vaccination. 4. Severe allergic reactions from a vaccine are very rare, estimated at less than 1 in a million doses. If one were to occur, it would usually be within a few minutes to a few hours after the vaccination. Mild problems following inactivated flu vaccine:  soreness, redness, or swelling where the shot was given  hoarseness  sore, red or itchy eyes  cough  fever  aches  headache  itching  fatigue If these problems occur, they usually begin soon after the shot and last 1 or 2 days. Moderate problems following inactivated flu vaccine:  Young children who get inactivated flu vaccine and pneumococcal vaccine (PCV13) at the same time may be at increased risk for seizures caused by fever. Ask your doctor for more information. Tell your doctor if a child who is getting flu vaccine has ever had a seizure. Inactivated flu vaccine does not contain live flu virus, so you cannot get the flu from this vaccine. As with any medicine, there is a very remote chance of a vaccine causing a serious injury or death. The safety of vaccines is  always being monitored. For more information, visit: http://www.aguilar.org/ 5. What if there is a serious reaction? What should I look for?  Look for anything that concerns you, such as signs of a severe allergic reaction, very high fever, or behavior changes. Signs of a severe allergic reaction can include hives, swelling of the face and throat, difficulty breathing, a fast heartbeat, dizziness, and weakness. These would start a few minutes to a few hours after the vaccination. What should I do?  If you think it is a severe allergic reaction or other emergency that can't wait, call 9-1-1 and get the person to the nearest hospital. Otherwise, call your doctor.  Afterward, the reaction should be reported to the Vaccine Adverse  Event Reporting System (VAERS). Your doctor should file this report, or you can do it yourself through the VAERS web site at www.vaers.SamedayNews.es, or by calling 903-607-6567. VAERS does not give medical advice. 6. The National Vaccine Injury Compensation Program The [employer] Vaccine Injury Compensation Program (VICP) is a federal program that was created to compensate people who may have been injured by certain vaccines. Persons who believe they may have been injured by a vaccine can learn about the program and about filing a claim by calling 754 459 6038 or visiting the [place] website at GoldCloset.com.ee. There is a time limit to file a claim for compensation. 7. How can I learn more?  Ask your health care provider.  Call your local or state health department.  Contact the Centers for Disease Control and Prevention (CDC):  Call (618)282-4319 (1-800-CDC-INFO) or  Visit CDC's website at https://gibson.com/ CDC Vaccine Information Statement (Interim) Inactivated Influenza Vaccine (07/16/2013) Document Released: 09/08/2006 Document Revised: 03/31/2014 Document Reviewed: 11/01/2013 [facility] Patient Information 2015 [place]. This information is not intended to replace advice given to you by your health care provider. Make sure you discuss any questions you have with your health care provider.   Keeping you healthy  Get these tests  Blood pressure- Have your blood pressure checked once a year by your healthcare provider.  Normal blood pressure is 120/80  Weight- Have your body mass index (BMI) calculated to screen for obesity.  BMI is a measure of body fat based on height and weight. You can also calculate your own BMI at ViewBanking.si.  Cholesterol- Have your cholesterol checked every year.  Diabetes- Have your blood sugar checked regularly if you have high blood pressure, high cholesterol, have a family history of diabetes or if you are  overweight.  Screening for Colon Cancer- Colonoscopy starting at age 58.  Screening may begin sooner depending on your family history and other health conditions. Follow up colonoscopy as directed by your Gastroenterologist.  Screening for Prostate Cancer- Both blood work (PSA) and a rectal exam help screen for Prostate Cancer.  Screening begins at age 7 with African-American men and at age 27 with Caucasian men.  Screening may begin sooner depending on your family history.  Take these medicines  Aspirin- One aspirin daily can help prevent Heart disease and Stroke.  Flu shot- Every fall.  Tetanus- Every 10 years.  Zostavax- Once after the age of 24 to prevent Shingles.  Pneumonia shot- Once after the age of 48; if you are younger than 61, ask your healthcare provider if you need a Pneumonia shot.  Take these steps  Don't smoke- If you do smoke, talk to your doctor about quitting.  For tips on how to quit, go to www.smokefree.gov or call 1-800-QUIT-NOW.  Be physically active- Exercise 5  days a week for at least 30 minutes.  If you are not already physically active start slow and gradually work up to 30 minutes of moderate physical activity.  Examples of moderate activity include walking briskly, mowing the yard, dancing, swimming, bicycling, etc.  Eat a healthy diet- Eat a variety of healthy food such as fruits, vegetables, low fat milk, low fat cheese, yogurt, lean meant, poultry, fish, beans, tofu, etc. For more information go to www.thenutritionsource.org  Drink alcohol in moderation- Limit alcohol intake to less than two drinks a day. Never drink and drive.  Dentist- Brush and floss twice daily; visit your dentist twice a year.  Depression- Your emotional health is as important as your physical health. If you're feeling down, or losing interest in things you would normally enjoy please talk to your healthcare provider.  Eye exam- Visit your eye doctor every year.  Safe sex- If  you may be exposed to a sexually transmitted infection, use a condom.  Seat belts- Seat belts can save your life; always wear one.  Smoke/Carbon Monoxide detectors- These detectors need to be installed on the appropriate level of your home.  Replace batteries at least once a year.  Skin cancer- When out in the sun, cover up and use sunscreen 15 SPF or higher.  Violence- If anyone is threatening you, please tell your healthcare provider.  Living Will/ Health care power of attorney- Speak with your healthcare provider and family.    I have ordered a referral to Podiatry [facility]). A member of our staff will contact you with the details about your appointment. I refilled the medications that I am responsible for 1 year.  Enjoy The Holidays and Be Well.

## 2014-10-28 NOTE — Progress Notes (Signed)
Subjective:    Patient ID: Dakota Gutierrez, male    DOB: Jan 14, 1951, 63 y.o.   MRN: [id number]  HPI  This 63 y.o. 63 male is here for Welcome to [facility] Preventative Visit and complete physical exam. He feels well and has retired; he is comfortable with his new relaxed lifestyle, admitting that job was stressful. He and long-time girlfriend are planning to marry in 2016.  HCM: IMM- Current. States he has had Zostavax at a local [facility].           CRS- May 2013 w/ recall at 5 years.           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           Dental- Dentition in good state of repair.             Patient Active Problem List   Diagnosis Date Noted  . Obesity 12/29/2011    Priority: Medium  . Hypersomnolence 08/14/2012  . History of Helicobacter pylori infection 04/16/2012  . GERD (gastroesophageal reflux disease) 04/16/2012  . Elevated transaminase level 02/02/2012  . 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 Syncope   . Ventricular tachycardia   . Anxiety     Prior to Admission medications   Medication Sig Start Date End Date Taking? Authorizing Provider  amiodarone (PACERONE) 200 MG tablet Take 0.5 tablets (100 mg total) by mouth daily. 08/29/13   [name], MD  aspirin EC 81 MG tablet Take 1 tablet (81 mg total) by mouth daily. 11/11/11   [name], MD  Cholecalciferol (VITAMIN D) 2000 UNITS tablet Take 2,000 Units by mouth daily.    Historical Provider, MD  famotidine (PEPCID) 20 MG tablet Take 1 tablet (20 mg total) by mouth 2 (two) times daily as needed. 04/16/12   Jerene Bears, MD  losartan-hydrochlorothiazide (HYZAAR) 100-12.5 MG per tablet Take 1 tablet by mouth daily.    Barton Fanny, MD  metoprolol (LOPRESSOR) 50 MG tablet TAKE ONE TABLET BY MOUTH TWICE DAILY 10/01/14   [name], MD  Omega-3 Fatty Acids (FISH OIL) 1000 MG CAPS Take 1 capsule by mouth 2 (two) times daily.     Historical  Provider, MD  tadalafil (CIALIS) 20 MG tablet Take 1 tablet (20 mg total) by mouth daily as needed. 05/16/14   Barton Fanny, MD  valACYclovir (VALTREX) 1000 MG tablet Take 1 tablet (1,000 mg total) by mouth daily.    Barton Fanny, MD    History   Social History  . Marital Status: Unknown    Spouse Name: N/A    Number of Children: N/A  . Years of Education: N/A   Occupational History  . Retired Pharmacist, hospital    Social History Main Topics  . Smoking status: Never Smoker   . Smokeless tobacco: Never Used  . Alcohol Use: 1.2 oz/week    2 Cans of beer per week     Comment: 4/week  . Drug Use: No  . Sexual Activity: Yes   Other Topics Concern  . Not on file   Social History Narrative   Divorced. Education: [employer]. Exercise: walking 3 times a week for 30 minutes.   Retired Pharmacist, hospital    Family History  Problem Relation Age of Onset  . Colon cancer Neg Hx   . Esophageal cancer Neg Hx   . Stomach cancer Neg Hx   . Rectal cancer Neg  Hx   . Hyperlipidemia Mother   . Heart disease Father      Review of Systems  Eyes: Positive for visual disturbance.       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  Cardiovascular: Positive for palpitations.       Sees Dr. Caryl Comes every 6 months and has remote testing of pacemaker betw/ visits w/ specialist.  Gastrointestinal: Positive for diarrhea, anal bleeding and rectal pain.       Has hemorrhoids.  Genitourinary:       Has enlarged prostate gland- minimal symptoms (nocturia, and slow stream).  Musculoskeletal:       Left foot pain for several months; intermittent, located deep inside foot or medial aspect of foot. No known trauma. Occasional swelling of foot.  All other systems reviewed and are negative.      Objective:   Physical Exam  Constitutional: He is oriented to person, place, and time. Vital signs are normal. He appears well-developed and well-nourished. No distress.  HENT:  Head:  Normocephalic and atraumatic.  Right Ear: Hearing, tympanic membrane, external ear and ear canal normal.  Left Ear: Hearing, tympanic membrane, external ear and ear canal normal.  Nose: Nose normal. No nasal deformity or septal deviation.  Mouth/Throat: Uvula is midline, oropharynx is clear and moist and mucous membranes are normal. No oral lesions. Normal dentition. No uvula swelling.  Eyes: Conjunctivae, EOM and lids are normal. Pupils are equal, round, and reactive to light. No scleral icterus.  Neck: Trachea normal, normal range of motion, full passive range of motion without pain and phonation normal. Neck supple. No JVD present. No spinous process tenderness and no muscular tenderness present. No thyroid mass and no thyromegaly present.  Cardiovascular: Normal rate, regular rhythm, S1 normal, S2 normal, normal heart sounds, intact distal pulses and normal pulses.   No extrasystoles are present. PMI is not displaced.  Exam reveals no gallop and no friction rub.   No murmur heard. Pacemaker palpable in L anterior upper chest wall.  Pulmonary/Chest: Effort normal and breath sounds normal. No respiratory distress.  Abdominal: Soft. Normal appearance and bowel sounds are normal. He exhibits no distension, no pulsatile midline mass and no mass. There is no tenderness. There is no rebound.  Genitourinary:  Deferred.  Musculoskeletal:       Right ankle: Normal.       Left ankle: Normal.       Cervical back: Normal.       Thoracic back: Normal.       Lumbar back: Normal.       Right foot: There is deformity.       Left foot: There is deformity. There is normal range of motion, no tenderness, no bony tenderness and normal capillary refill.  Both feet are wide in forefoot w/ narrow heel. Remainder of exam unremarkable.  Lymphadenopathy:       Head (right side): No submental, no submandibular, no tonsillar, no preauricular, no posterior auricular and no occipital adenopathy present.       Head  (left side): No submental, no submandibular, no tonsillar, no preauricular, no posterior auricular and no occipital adenopathy present.    He has no cervical adenopathy.       Right: No inguinal and no supraclavicular adenopathy present.       Left: No inguinal and no supraclavicular adenopathy present.  Neurological: He is alert and oriented to person, place, and time. He has normal strength and  normal reflexes. He displays no atrophy. No cranial nerve deficit or sensory deficit. He exhibits normal muscle tone. He displays a negative Romberg sign. Coordination and gait normal.  Skin: Skin is warm, dry and intact. Lesion noted. No ecchymosis and no rash noted. He is not diaphoretic. No cyanosis or erythema. No pallor. Nails show no clubbing.  Trunk/back w/ multiple Seb k lesions of varying sizes. Other pigmented lesions but none that look suspicious.  Psychiatric: He has a normal mood and affect. His speech is normal and behavior is normal. Judgment and thought content normal. Cognition and memory are normal.  Nursing note and vitals reviewed.     Assessment & Plan:  Welcome to Medicare preventive visit - Plan: PSA, Medicare, Thyroid Panel With TSH, COMPLETE METABOLIC PANEL WITH GFR, Lipid panel, IFOBT POC (occult bld, rslt in office)  Left foot pain - Advised that foot pain could be due to a number of etiologies (ligamnet tear or tendon sprain, stress fracture, neuroma, etc). Plan: Ambulatory referral to Podiatry  Need for prophylactic vaccination and inoculation against influenza - Plan: Flu Vaccine QUAD 36+ mos IM  Need for pneumococcal vaccination - Plan: Pneumococcal conjugate vaccine 13-valent IM  Prostate cancer screening - Plan: PSA, Medicare   Meds ordered this encounter  Medications  . valACYclovir (VALTREX) 1000 MG tablet    Sig: Take 1 tablet (1,000 mg total) by mouth daily.    Dispense:  30 tablet    Refill:  11  . losartan-hydrochlorothiazide (HYZAAR) 100-12.5 MG per  tablet    Sig: Take 1 tablet by mouth daily.    Dispense:  90 tablet    Refill:  3

## 2014-10-29 LAB — THYROID PANEL WITH TSH
FREE THYROXINE INDEX: 2.6 (ref 1.4–3.8)
T3 UPTAKE: 29 % (ref 22–35)
T4, Total: 8.8 ug/dL (ref 4.5–12.0)
TSH: 2.387 u[IU]/mL (ref 0.350–4.500)

## 2014-10-29 LAB — PSA, MEDICARE: PSA: 0.09 ng/mL (ref <=4.00)

## 2014-10-30 ENCOUNTER — Telehealth: Payer: Self-pay | Admitting: Radiology

## 2014-10-30 NOTE — Telephone Encounter (Signed)
Called patient. appt made reviewed labs.

## 2014-10-30 NOTE — Telephone Encounter (Signed)
-----   Message from Barton Fanny, MD sent at 10/30/2014  9:18 AM EST ----- Please call this pt and schedule a follow-up appt to have A1c checked. He will need to be scheduled w/ me so that I can talk with him once the A1c is resulted.  I have released his labs to him via MyChart with instructions to schedule follow-up visit.  Thank you.

## 2014-11-07 ENCOUNTER — Ambulatory Visit (INDEPENDENT_AMBULATORY_CARE_PROVIDER_SITE_OTHER): Payer: Medicare Other | Admitting: Family Medicine

## 2014-11-07 ENCOUNTER — Encounter: Payer: Self-pay | Admitting: Family Medicine

## 2014-11-07 VITALS — BP 130/80 | HR 105 | Temp 98.5°F | Resp 16 | Ht 65.25 in | Wt 191.0 lb

## 2014-11-07 DIAGNOSIS — E1165 Type 2 diabetes mellitus with hyperglycemia: Secondary | ICD-10-CM

## 2014-11-07 DIAGNOSIS — IMO0002 Reserved for concepts with insufficient information to code with codable children: Secondary | ICD-10-CM

## 2014-11-07 DIAGNOSIS — R7309 Other abnormal glucose: Secondary | ICD-10-CM

## 2014-11-07 DIAGNOSIS — R739 Hyperglycemia, unspecified: Secondary | ICD-10-CM

## 2014-11-07 LAB — IFOBT (OCCULT BLOOD): IFOBT: POSITIVE

## 2014-11-07 LAB — POCT GLYCOSYLATED HEMOGLOBIN (HGB A1C): HEMOGLOBIN A1C: 8.5

## 2014-11-07 MED ORDER — METFORMIN HCL 500 MG PO TABS: ORAL_TABLET | ORAL | Status: DC

## 2014-11-07 NOTE — Patient Instructions (Signed)
You have Type II DM. New medication is Metformin 500 mg 1 tablet with largest meal of the day.  Check your blood sugar (BS) every morning (goal fasting BS= 100-120). Check your BS before your last meal. Sometimes, check it before you go to bed. If your bedtime BS is below 100, have a small bedtime snack so that you do not have hypoglycemia during the night.  Contact the clinic if you have any questions or concerns. If you have any problem with the new medication, please call.

## 2014-11-10 ENCOUNTER — Encounter: Payer: Self-pay | Admitting: Internal Medicine

## 2014-11-10 ENCOUNTER — Ambulatory Visit (INDEPENDENT_AMBULATORY_CARE_PROVIDER_SITE_OTHER): Payer: 59 | Admitting: *Deleted

## 2014-11-10 DIAGNOSIS — I472 Ventricular tachycardia, unspecified: Secondary | ICD-10-CM

## 2014-11-10 LAB — MDC_IDC_ENUM_SESS_TYPE_REMOTE
Battery Voltage: 2.97 V
Brady Statistic AP VP Percent: 4.31 %
Brady Statistic AP VS Percent: 76.87 %
Brady Statistic AS VP Percent: 0.03 %
Brady Statistic RA Percent Paced: 81.18 %
Brady Statistic RV Percent Paced: 4.34 %
HIGH POWER IMPEDANCE MEASURED VALUE: 342 Ohm
HIGH POWER IMPEDANCE MEASURED VALUE: 38 Ohm
HIGH POWER IMPEDANCE MEASURED VALUE: 48 Ohm
Lead Channel Impedance Value: 399 Ohm
Lead Channel Impedance Value: 437 Ohm
Lead Channel Pacing Threshold Amplitude: 0.625 V
Lead Channel Pacing Threshold Amplitude: 0.75 V
Lead Channel Pacing Threshold Pulse Width: 0.4 ms
Lead Channel Sensing Intrinsic Amplitude: 12 mV
Lead Channel Sensing Intrinsic Amplitude: 12 mV
Lead Channel Setting Pacing Amplitude: 2 V
Lead Channel Setting Pacing Pulse Width: 0.4 ms
Lead Channel Setting Sensing Sensitivity: 0.3 mV
MDC IDC MSMT LEADCHNL RA SENSING INTR AMPL: 3.25 mV
MDC IDC MSMT LEADCHNL RA SENSING INTR AMPL: 3.25 mV
MDC IDC MSMT LEADCHNL RV PACING THRESHOLD PULSEWIDTH: 0.4 ms
MDC IDC SESS DTM: 20151214153153
MDC IDC SET LEADCHNL RV PACING AMPLITUDE: 2.5 V
MDC IDC SET ZONE DETECTION INTERVAL: 320 ms
MDC IDC SET ZONE DETECTION INTERVAL: 350 ms
MDC IDC STAT BRADY AS VS PERCENT: 18.79 %
Zone Setting Detection Interval: 400 ms
Zone Setting Detection Interval: 450 ms

## 2014-11-10 NOTE — Progress Notes (Signed)
S:  This 63 y.o. 78 male returns today for Diabetes screening test; he had elevated glucose on CMET. Last A1c = 5.7% in Sept 2012. Pt has chronic lipid disorder w/ hypertriglyceridemia and low HDL. Pt denies diaphoresis, abnormal weight loss, increased appetite, vision disturbances, polyuria, polydipsia, HA, dizziness, numbness, weakness or syncope. He has been trying to improve lifestyle and reduce weight. Family hx negative foe DM.  Patient Active Problem List   Diagnosis Date Noted  . Obesity 12/29/2011    Priority: Medium  . Hypersomnolence 08/14/2012  . History of Helicobacter pylori infection 04/16/2012  . GERD (gastroesophageal reflux disease) 04/16/2012  . Elevated transaminase level 02/02/2012  . 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 Syncope   . Ventricular tachycardia   . Anxiety     Prior to Admission medications   Medication Sig Start Date End Date Taking? Authorizing Provider  amiodarone (PACERONE) 200 MG tablet Take 0.5 tablets (100 mg total) by mouth daily. 08/29/13  Yes [name], MD  aspirin EC 81 MG tablet Take 1 tablet (81 mg total) by mouth daily. 11/11/11  Yes [name], MD  Cholecalciferol (VITAMIN D) 2000 UNITS tablet Take 2,000 Units by mouth daily.   Yes Historical Provider, MD  famotidine (PEPCID) 20 MG tablet Take 1 tablet (20 mg total) by mouth 2 (two) times daily as needed. 04/16/12  Yes Jerene Bears, MD  losartan-hydrochlorothiazide (HYZAAR) 100-12.5 MG per tablet Take 1 tablet by mouth daily. 10/28/14  Yes Barton Fanny, MD  metoprolol (LOPRESSOR) 50 MG tablet TAKE ONE TABLET BY MOUTH TWICE DAILY 10/01/14  Yes [name], MD  Omega-3 Fatty Acids (FISH OIL) 1000 MG CAPS Take 1 capsule by mouth 2 (two) times daily.    Yes Historical Provider, MD  tadalafil (CIALIS) 20 MG tablet Take 1 tablet (20 mg total) by mouth daily as needed. 05/16/14  Yes Barton Fanny, MD  valACYclovir (VALTREX) 1000 MG tablet Take 1  tablet (1,000 mg total) by mouth daily. 10/28/14  Yes Barton Fanny, MD  metFORMIN (GLUCOPHAGE) 500 MG tablet Take 1 tablet once a day with largest meal. 11/07/14   Barton Fanny, MD   Family History  Problem Relation Age of Onset  . Colon cancer Neg Hx   . Esophageal cancer Neg Hx   . Stomach cancer Neg Hx   . Rectal cancer Neg Hx   . Hyperlipidemia Mother   . Heart disease Father     ROS: As per HPI.   O: Filed Vitals:   11/07/14 1146  BP: 130/80  Pulse: 105  Temp: 98.5 F (36.9 C)  Resp: 16   GEN: In NAD; WN,WD. HENT: [place]/AT; EOMI w/clear conj/sclerae. Otherwise unremarkable. COR: RRR. LUNGS: Unlabored resp. SKIN; W&D; intact w/o diaphoresis or pallor. NEURO: A&O x 3; CNs intact. Nonfocal.  Results for orders placed or performed in visit on 11/07/14  POCT glycosylated hemoglobin (Hb A1C)  Result Value Ref Range   Hemoglobin A1C 8.5     A/P: Type II diabetes mellitus, uncontrolled - Discussed need for meal planning and better nutrition; pt states he has knowledge based on information about cardiac diet he received years ago. Trial of once-a-day Metformin with largest meal (dinner because his fiancee works late hours and they have their evening meal together). Pt declines referral to nutritionist. FSBS bid to start.. Plan: For home use only DME Glucometer  Elevated blood sugar - Plan: POCT glycosylated hemoglobin (Hb A1C)  Meds ordered  this encounter  Medications  . metFORMIN (GLUCOPHAGE) 500 MG tablet    Sig: Take 1 tablet once a day with largest meal.    Dispense:  90 tablet    Refill:  3

## 2014-11-10 NOTE — Progress Notes (Signed)
Remote ICD transmission.   

## 2014-12-04 ENCOUNTER — Encounter: Payer: Self-pay | Admitting: Cardiology

## 2014-12-18 ENCOUNTER — Encounter: Payer: Self-pay | Admitting: Cardiology

## 2015-01-16 ENCOUNTER — Encounter: Payer: Self-pay | Admitting: Family Medicine

## 2015-01-16 ENCOUNTER — Ambulatory Visit (INDEPENDENT_AMBULATORY_CARE_PROVIDER_SITE_OTHER): Payer: 59 | Admitting: Family Medicine

## 2015-01-16 VITALS — BP 114/74 | HR 95 | Temp 98.6°F | Resp 16 | Ht 65.0 in | Wt 188.0 lb

## 2015-01-16 DIAGNOSIS — E119 Type 2 diabetes mellitus without complications: Secondary | ICD-10-CM

## 2015-01-16 LAB — POCT GLYCOSYLATED HEMOGLOBIN (HGB A1C): HEMOGLOBIN A1C: 6.7

## 2015-01-16 NOTE — Patient Instructions (Signed)
Here are the names of some OPHTHALMOLOGISTS:   Darleen Crocker, MD              [phone]                                                                                            Malena Peer MD       [place]                                                                                            Annia Belt, MD              [place]                                                                                            [place]                                                                                            Lavell Anchors, MD             [place]                                                                                            Luberta Mutter, MD       [phone]  Deloria Lair, MD                [place]                                                                                            Shon Hough, MD       [place]                                                                                            Vevelyn Royals, [place]                                                                                            Marylynn Pearson, [place]

## 2015-01-16 NOTE — Progress Notes (Signed)
S:  This 64 y.o. Male was recently diagnosed w/ Type II DM at last visit. Metformin 500 mg 1 tab daily w/ largest meal is well tolerated. Pt has changed diet and he and wife have changed lifestyle to include more exercise. He would like to schedule with a nutritionist. Pt has glucometer but had no instruction on use (until today). He needs referral to ophthalmologist.  Patient Active Problem List   Diagnosis Date Noted  . Obesity 12/29/2011    Priority: Medium  . Type II diabetes mellitus 01/16/2015  . Hypersomnolence 08/14/2012  . History of Helicobacter pylori infection 04/16/2012  . GERD (gastroesophageal reflux disease) 04/16/2012  . Elevated transaminase level 02/02/2012  . 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 Syncope   . Ventricular tachycardia   . Anxiety     Prior to Admission medications   Medication Sig Start Date End Date Taking? Authorizing Provider  amiodarone (PACERONE) 200 MG tablet Take 0.5 tablets (100 mg total) by mouth daily. 08/29/13  Yes [name], MD  aspirin EC 81 MG tablet Take 1 tablet (81 mg total) by mouth daily. 11/11/11  Yes [name], MD  Cholecalciferol (VITAMIN D) 2000 UNITS tablet Take 2,000 Units by mouth daily.   Yes Historical Provider, MD  famotidine (PEPCID) 20 MG tablet Take 1 tablet (20 mg total) by mouth 2 (two) times daily as needed. 04/16/12  Yes Jerene Bears, MD  losartan-hydrochlorothiazide (HYZAAR) 100-12.5 MG per tablet Take 1 tablet by mouth daily. 10/28/14  Yes Barton Fanny, MD  metFORMIN (GLUCOPHAGE) 500 MG tablet Take 1 tablet once a day with largest meal. 11/07/14  Yes Barton Fanny, MD  metoprolol (LOPRESSOR) 50 MG tablet TAKE ONE TABLET BY MOUTH TWICE DAILY 10/01/14  Yes [name], MD  Omega-3 Fatty Acids (FISH OIL) 1000 MG CAPS Take 1 capsule by mouth 2 (two) times daily.    Yes Historical Provider, MD  tadalafil (CIALIS) 20 MG tablet Take 1 tablet (20 mg total) by mouth daily as  needed. 05/16/14  Yes Barton Fanny, MD  valACYclovir (VALTREX) 1000 MG tablet Take 1 tablet (1,000 mg total) by mouth daily. 10/28/14  Yes Barton Fanny, MD    ROS: Negative for anorexia, fatigue, diaphoresis, vision changes, CP or palpitations, edema, excessive hunger or thirst, HA, dizziness, weakness or syncope.  O: Filed Vitals:   01/16/15 1319  BP: 114/74  Pulse: 95  Temp: 98.6 F (37 C)  Resp: 16   GEN: In NAD; WN,WD. Weight down 3-4 lbs. HENT: [place]/AT. EOMI w/ clear conj/sclerae and moist oral mucosa. COR: RRR. LUNGS: Unlabored resp. SKIN: W&D; intact w/o erythema or pallor. NEURO: A&O x 3; CNs intact. Nonfocal.  Results for orders placed or performed in visit on 01/16/15  POCT glycosylated hemoglobin (Hb A1C)  Result Value Ref Range   Hemoglobin A1C 6.7     A/P: Type 2 diabetes mellitus without complication - Much improved. Pt advised to check FSBS once a day and sometimes after a meal as well as at bedtime. Goal BS= 90-110. Do not go to bed w/ FSBS < 80; have a bedtime snack. Nutritionist will review and give him guidance. I advised skipping medication if he eats less due to decreased appetite or sickness. I have encouraged pt to continue with nutrition modifications and weight loss. He may be able to discontinue medication at next visit if A1c is the same or better. Plan: POCT glycosylated hemoglobin (Hb A1C), Ambulatory referral  to Nutrition and Diabetic Education

## 2015-01-27 ENCOUNTER — Encounter: Payer: Medicare Other | Attending: Family Medicine

## 2015-01-27 VITALS — Ht 65.0 in | Wt 187.3 lb

## 2015-01-27 DIAGNOSIS — E119 Type 2 diabetes mellitus without complications: Secondary | ICD-10-CM

## 2015-01-27 DIAGNOSIS — Z713 Dietary counseling and surveillance: Secondary | ICD-10-CM | POA: Diagnosis not present

## 2015-01-27 NOTE — Progress Notes (Signed)
Patient was seen on 01/27/15 for the first of a series of three diabetes self-management courses at [facility].  Patient Education Plan per assessed needs and concerns is to attend four course education program for Diabetes Self Management Education.  The following learning objectives were met by the patient during this class:  Describe diabetes  State some common risk factors for diabetes  Defines the role of glucose and insulin  Identifies type of diabetes and pathophysiology  Describe the relationship between diabetes and cardiovascular risk  State the members of the Healthcare Team  States the rationale for glucose monitoring  State when to test glucose  State their individual Target Range  State the importance of logging glucose readings  Describe how to interpret glucose readings  Identifies A1C target  Explain the correlation between A1c and eAG values  State symptoms and treatment of high blood glucose  State symptoms and treatment of low blood glucose  Explain proper technique for glucose testing  Identifies proper sharps disposal  Handouts given during class include:  Living Well with Diabetes book  Carb Counting and Meal Planning book  Meal Plan Card  Carbohydrate guide  Meal planning worksheet  Low Sodium Flavoring Tips  The diabetes portion plate  F8B to eAG Conversion Chart  Diabetes Medications  Diabetes Recommended Care Schedule  Support Group  Diabetes Success Plan  Core Class Satisfaction Survey  Follow-Up Plan:  Attend core 2

## 2015-02-03 DIAGNOSIS — E119 Type 2 diabetes mellitus without complications: Secondary | ICD-10-CM

## 2015-02-03 NOTE — Progress Notes (Signed)

## 2015-02-10 DIAGNOSIS — E119 Type 2 diabetes mellitus without complications: Secondary | ICD-10-CM

## 2015-02-11 NOTE — Progress Notes (Signed)
Patient was seen on 02/10/15 for the third of a series of three diabetes self-management courses at the [facility]. The following learning objectives were met by the patient during this class:  . State the amount of activity recommended for healthy living . Describe activities suitable for individual needs . Identify ways to regularly incorporate activity into daily life . Identify barriers to activity and ways to over come these barriers  Identify diabetes medications being personally used and their primary action for lowering glucose and possible side effects . Describe role of stress on blood glucose and develop strategies to address psychosocial issues . Identify diabetes complications and ways to prevent them  Explain how to manage diabetes during illness . Evaluate success in meeting personal goal . Establish 2-3 goals that they will plan to diligently work on until they return for the  75-monthfollow-up visit  Goals:   I will count my carb choices at most meals and snacks  I will be active 5 times a week  I will take my diabetes medications as scheduled  I will test my glucose at least 2 times a day, 7 days a week  I will look at patterns in my record book at least 7 days a month  To help manage stress I will  Exercise & read at least 3 times a week  Your patient has identified these potential barriers to change:  Motivation Finances Stress  Your patient has identified their diabetes self-care support plan as  Family Support Magazine Subscriptions  Plan:  Attend Core 4 in 4 months

## 2015-02-16 ENCOUNTER — Encounter: Payer: Self-pay | Admitting: Internal Medicine

## 2015-02-16 ENCOUNTER — Ambulatory Visit (INDEPENDENT_AMBULATORY_CARE_PROVIDER_SITE_OTHER): Payer: Medicare Other | Admitting: Internal Medicine

## 2015-02-16 VITALS — BP 100/62 | HR 96 | Ht 65.0 in | Wt 187.0 lb

## 2015-02-16 DIAGNOSIS — Z95810 Presence of automatic (implantable) cardiac defibrillator: Secondary | ICD-10-CM

## 2015-02-16 DIAGNOSIS — I472 Ventricular tachycardia, unspecified: Secondary | ICD-10-CM

## 2015-02-16 LAB — MDC_IDC_ENUM_SESS_TYPE_INCLINIC
Battery Voltage: 2.98 V
Brady Statistic AP VS Percent: 78.24 %
Brady Statistic AS VP Percent: 0.03 %
Brady Statistic AS VS Percent: 15.84 %
Brady Statistic RA Percent Paced: 84.14 %
Date Time Interrogation Session: 20160321161138
HIGH POWER IMPEDANCE MEASURED VALUE: 323 Ohm
HIGH POWER IMPEDANCE MEASURED VALUE: 38 Ohm
HIGH POWER IMPEDANCE MEASURED VALUE: 47 Ohm
HighPow Impedance: 209 Ohm
Lead Channel Impedance Value: 399 Ohm
Lead Channel Impedance Value: 437 Ohm
Lead Channel Pacing Threshold Amplitude: 0.75 V
Lead Channel Pacing Threshold Pulse Width: 0.4 ms
Lead Channel Pacing Threshold Pulse Width: 0.4 ms
Lead Channel Sensing Intrinsic Amplitude: 10.125 mV
Lead Channel Sensing Intrinsic Amplitude: 2.5 mV
Lead Channel Setting Pacing Amplitude: 2 V
Lead Channel Setting Pacing Pulse Width: 0.4 ms
MDC IDC MSMT LEADCHNL RA PACING THRESHOLD AMPLITUDE: 0.375 V
MDC IDC MSMT LEADCHNL RA SENSING INTR AMPL: 2.375 mV
MDC IDC MSMT LEADCHNL RV SENSING INTR AMPL: 16.375 mV
MDC IDC SET LEADCHNL RV PACING AMPLITUDE: 2.5 V
MDC IDC SET LEADCHNL RV SENSING SENSITIVITY: 0.3 mV
MDC IDC STAT BRADY AP VP PERCENT: 5.9 %
MDC IDC STAT BRADY RV PERCENT PACED: 5.92 %
Zone Setting Detection Interval: 320 ms
Zone Setting Detection Interval: 350 ms
Zone Setting Detection Interval: 400 ms
Zone Setting Detection Interval: 450 ms

## 2015-02-16 NOTE — Progress Notes (Signed)
Patient Care Team: Barton Fanny, MD as PCP - General (Family Medicine) [name], MD (Cardiology)   HPI  Dakota Gutierrez is a 64 y.o. 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 Unfortunately, in the days that followed he developed VT storm with multiple shocks and was treated with amiodarone which has been gradually down titrated to 100 mg a day.  Amiodarone surveillance had demonstrated elevated liver function tests spring 2013 . We elected to discontinue the amiodarone albeit with some reluctance on the part of the family. When seen in June of recurrent episodes of ventricular tachycardia 2 of which required therapy. 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 In December hemoglobin A1c was 8.5 and in February was 6.7. He has accomplished a 10 pound weight loss    Myoview 2012-no ischemia ejection fraction 56%   Past Medical History  Diagnosis Date  . Syncope   . Ventricular tachycardia     storm following ICD implant on amiodarone   . HTN (hypertension)   . Dual implantable cardiac defibrillator in situ     Medtronic [id number] Protecta  . 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 Elevated transaminase level     10/2011  ? amio  . Other primary cardiomyopathies   . Diabetes mellitus without complication   . GERD (gastroesophageal reflux disease)     Past Surgical History  Procedure Laterality Date  . Cardiac defibrillator placement      Medtronic [id number] Protecta  . Tonsillectomy and adenoidectomy    .  Gallbladder surgery    . Hernia repair    . Cholecystectomy      Current Outpatient Prescriptions  Medication Sig Dispense Refill  . amiodarone (PACERONE) 200 MG tablet Take 0.5 tablets (100 mg total) by mouth daily. 90 tablet 3  . aspirin EC 81 MG tablet Take 1 tablet (81 mg total) by mouth daily.    . Cholecalciferol (VITAMIN D) 2000 UNITS tablet Take 2,000 Units by mouth daily.    . famotidine (PEPCID) 20 MG tablet Take 1 tablet (20 mg total) by mouth 2 (two) times daily as needed. (Patient taking differently: Take 20 mg by mouth 2 (two) times daily as needed for heartburn or indigestion. ) 60 tablet 4  . losartan-hydrochlorothiazide (HYZAAR) 100-12.5 MG per tablet Take 1 tablet by mouth daily. 90 tablet 3  . metFORMIN (GLUCOPHAGE) 500 MG tablet Take 1 tablet once a day with largest meal. 90 tablet 3  . metoprolol (LOPRESSOR) 50 MG tablet TAKE ONE TABLET BY MOUTH TWICE DAILY 180 tablet 1  . Omega-3 Fatty Acids (FISH OIL) 1000 MG CAPS Take 1 capsule by mouth 2 (two) times daily.     . tadalafil (CIALIS) 20 MG tablet Take 1 tablet (20 mg total) by mouth daily as needed. (Patient taking differently: Take 20 mg by mouth daily as needed for erectile dysfunction. ) 10 tablet 11  . valACYclovir (VALTREX) 1000 MG tablet Take 1 tablet (1,000 mg total) by mouth daily. 30 tablet 11   No current facility-administered medications  for this visit.    Allergies  Allergen Reactions  . Adhesive [Tape]     Swelling and welts formed    Review of Systems negative except from HPI and PMH except for complaints of back pain and muscle pain  Physical Exam BP 100/62 mmHg  Pulse 96  Ht 5\' 5"  (1.651 m)  Wt 187 lb (84.823 kg)  BMI 31.12 kg/m2 Well developed and well nourished in no acute distress HENT normal E scleral and icterus clear Neck Supple JVP flat; carotids brisk and full Clear to ausculation  Regular rate and rhythm, no murmurs gallops or rub Soft with active bowel sounds No clubbing  cyanosis none Edema Alert and oriented, grossly normal motor and sensory function Skin Warm and Dry  ECG is atrial pacing  at 96 Intervals 13/11/34  Assessment and  Plan  VT No intercurrent treated Ventricular tachycardia --some nonsustained     ICD Medtronic  The patient's device was interrogated.  The information was reviewed. No changes were made in the programming.    Diabetes  Sinus noded dysfunction   Elevated transaminases- now  normal  We spent some time discussing the importance of exercise and weight loss as relates to diabetes  Nonsustained ventricular tachycardia episodes still appear to be clustered. He was not able to identify a trigger.   We will reprogram his device is her cc overzealous atrial pacing.

## 2015-02-16 NOTE — Patient Instructions (Signed)
Your physician recommends that you continue on your current medications as directed. Please refer to the Current Medication list given to you today.  Your physician wants you to follow-up in: 12 month follow up with Dr. Caryl Comes. You will receive a reminder letter in the mail two months in advance. If you don't receive a letter, please call our office to schedule the follow-up appointment.

## 2015-02-17 ENCOUNTER — Other Ambulatory Visit: Payer: Self-pay | Admitting: Radiology

## 2015-02-17 DIAGNOSIS — I6523 Occlusion and stenosis of bilateral carotid arteries: Secondary | ICD-10-CM

## 2015-03-09 ENCOUNTER — Ambulatory Visit (HOSPITAL_COMMUNITY): Payer: Medicare Other | Attending: Internal Medicine

## 2015-03-09 DIAGNOSIS — E119 Type 2 diabetes mellitus without complications: Secondary | ICD-10-CM | POA: Diagnosis not present

## 2015-03-09 DIAGNOSIS — I6523 Occlusion and stenosis of bilateral carotid arteries: Secondary | ICD-10-CM | POA: Diagnosis present

## 2015-03-09 DIAGNOSIS — I10 Essential (primary) hypertension: Secondary | ICD-10-CM | POA: Insufficient documentation

## 2015-03-09 NOTE — Progress Notes (Signed)
Carotid duplex performed 

## 2015-03-24 ENCOUNTER — Other Ambulatory Visit: Payer: Self-pay | Admitting: Internal Medicine

## 2015-04-16 ENCOUNTER — Ambulatory Visit: Payer: 59 | Admitting: Family Medicine

## 2015-05-15 ENCOUNTER — Ambulatory Visit (INDEPENDENT_AMBULATORY_CARE_PROVIDER_SITE_OTHER): Payer: Medicare Other | Admitting: *Deleted

## 2015-05-15 ENCOUNTER — Encounter: Payer: Self-pay | Admitting: Internal Medicine

## 2015-05-15 DIAGNOSIS — I472 Ventricular tachycardia, unspecified: Secondary | ICD-10-CM

## 2015-05-18 NOTE — Progress Notes (Signed)
Remote ICD transmission.   

## 2015-05-20 ENCOUNTER — Encounter: Payer: Medicare Other | Attending: Family Medicine

## 2015-05-20 DIAGNOSIS — Z713 Dietary counseling and surveillance: Secondary | ICD-10-CM | POA: Diagnosis not present

## 2015-05-20 DIAGNOSIS — E119 Type 2 diabetes mellitus without complications: Secondary | ICD-10-CM | POA: Diagnosis present

## 2015-05-20 LAB — CUP PACEART REMOTE DEVICE CHECK
Brady Statistic AP VS Percent: 72.73 %
Brady Statistic AS VS Percent: 22.61 %
Brady Statistic RV Percent Paced: 4.66 %
HIGH POWER IMPEDANCE MEASURED VALUE: 49 Ohm
HighPow Impedance: 342 Ohm
HighPow Impedance: 39 Ohm
Lead Channel Impedance Value: 437 Ohm
Lead Channel Pacing Threshold Amplitude: 0.625 V
Lead Channel Pacing Threshold Amplitude: 0.75 V
Lead Channel Pacing Threshold Pulse Width: 0.4 ms
Lead Channel Sensing Intrinsic Amplitude: 10.625 mV
Lead Channel Setting Pacing Amplitude: 2 V
Lead Channel Setting Pacing Pulse Width: 0.4 ms
Lead Channel Setting Sensing Sensitivity: 0.3 mV
MDC IDC MSMT BATTERY VOLTAGE: 2.91 V
MDC IDC MSMT LEADCHNL RA IMPEDANCE VALUE: 399 Ohm
MDC IDC MSMT LEADCHNL RA PACING THRESHOLD PULSEWIDTH: 0.4 ms
MDC IDC MSMT LEADCHNL RA SENSING INTR AMPL: 1.875 mV
MDC IDC SESS DTM: 20160617182842
MDC IDC SET LEADCHNL RV PACING AMPLITUDE: 2.5 V
MDC IDC SET ZONE DETECTION INTERVAL: 320 ms
MDC IDC SET ZONE DETECTION INTERVAL: 450 ms
MDC IDC STAT BRADY AP VP PERCENT: 4.63 %
MDC IDC STAT BRADY AS VP PERCENT: 0.03 %
MDC IDC STAT BRADY RA PERCENT PACED: 77.36 %
Zone Setting Detection Interval: 350 ms
Zone Setting Detection Interval: 400 ms

## 2015-05-20 NOTE — Progress Notes (Signed)
Appt start time: 0900 end time:  1000.  Patient was seen on 05/20/2015 for a review of the series of three diabetes self-management courses at the [facility]. The following learning objectives were met by the patient during this class:  . Reviewed blood glucose monitoring and interpretation including the recommended target ranges and Hgb A1c.  . Reviewed on carb counting, importance of regularly scheduled meals/snacks, and meal planning.  . Reviewed the effects of physical activity on glucose levels and long-term glucose control.  Recommended goal of 150 minutes of physical activity/week. . Reviewed patient medications and discussed role of medication on blood glucose and possible side effects. . Discussed strategies to manage stress, psychosocial issues, and other obstacles to diabetes management. . Encouraged moderate weight reduction to improve glucose levels.   . Reviewed short-term complications: hyper- and hypo-glycemia.  Discussed causes, symptoms, and treatment options. . Reviewed prevention, detection, and treatment of long-term complications.  Discussed the role of prolonged elevated glucose levels on body systems.  Goals:  Follow Diabetes Meal Plan as instructed  Eat 3 meals and 2 snacks, every 3-5 hrs  Limit carbohydrate intake to 45 grams carbohydrate/meal Limit carbohydrate intake to 15 grams carbohydrate/snack Add lean protein foods to meals/snacks  Monitor glucose levels as instructed by your doctor  Aim for goal of 15-30 mins of physical activity daily as tolerated  Bring food record and glucose log to your next nutrition visit 

## 2015-06-05 ENCOUNTER — Encounter: Payer: Self-pay | Admitting: Cardiology

## 2015-07-02 ENCOUNTER — Encounter: Payer: Self-pay | Admitting: Cardiology

## 2015-08-17 ENCOUNTER — Encounter: Payer: Self-pay | Admitting: Internal Medicine

## 2015-08-17 ENCOUNTER — Ambulatory Visit (INDEPENDENT_AMBULATORY_CARE_PROVIDER_SITE_OTHER): Payer: Medicare Other | Admitting: Family Medicine

## 2015-08-17 ENCOUNTER — Ambulatory Visit (INDEPENDENT_AMBULATORY_CARE_PROVIDER_SITE_OTHER): Payer: Medicare Other | Admitting: *Deleted

## 2015-08-17 ENCOUNTER — Encounter: Payer: Self-pay | Admitting: Family Medicine

## 2015-08-17 VITALS — BP 104/66 | HR 96 | Temp 98.2°F | Resp 16 | Ht 65.0 in | Wt 183.6 lb

## 2015-08-17 DIAGNOSIS — K625 Hemorrhage of anus and rectum: Secondary | ICD-10-CM | POA: Diagnosis not present

## 2015-08-17 DIAGNOSIS — R198 Other specified symptoms and signs involving the digestive system and abdomen: Secondary | ICD-10-CM

## 2015-08-17 DIAGNOSIS — I472 Ventricular tachycardia, unspecified: Secondary | ICD-10-CM

## 2015-08-17 DIAGNOSIS — Z23 Encounter for immunization: Secondary | ICD-10-CM | POA: Diagnosis not present

## 2015-08-17 DIAGNOSIS — E119 Type 2 diabetes mellitus without complications: Secondary | ICD-10-CM | POA: Diagnosis not present

## 2015-08-17 DIAGNOSIS — R195 Other fecal abnormalities: Secondary | ICD-10-CM

## 2015-08-17 DIAGNOSIS — K649 Unspecified hemorrhoids: Secondary | ICD-10-CM

## 2015-08-17 LAB — CBC
HCT: 43.2 % (ref 39.0–52.0)
Hemoglobin: 14.5 g/dL (ref 13.0–17.0)
MCH: 27.5 pg (ref 26.0–34.0)
MCHC: 33.6 g/dL (ref 30.0–36.0)
MCV: 81.8 fL (ref 78.0–100.0)
MPV: 10.7 fL (ref 8.6–12.4)
PLATELETS: 214 10*3/uL (ref 150–400)
RBC: 5.28 MIL/uL (ref 4.22–5.81)
RDW: 13.1 % (ref 11.5–15.5)
WBC: 5.6 10*3/uL (ref 4.0–10.5)

## 2015-08-17 LAB — BASIC METABOLIC PANEL
BUN: 18 mg/dL (ref 7–25)
CALCIUM: 9.7 mg/dL (ref 8.6–10.3)
CO2: 28 mmol/L (ref 20–31)
CREATININE: 0.93 mg/dL (ref 0.70–1.25)
Chloride: 100 mmol/L (ref 98–110)
Glucose, Bld: 113 mg/dL — ABNORMAL HIGH (ref 65–99)
POTASSIUM: 4.4 mmol/L (ref 3.5–5.3)
Sodium: 140 mmol/L (ref 135–146)

## 2015-08-17 LAB — HEMOGLOBIN A1C
HEMOGLOBIN A1C: 6.1 % — AB (ref <5.7)
Mean Plasma Glucose: 128 mg/dL — ABNORMAL HIGH (ref <117)

## 2015-08-17 MED ORDER — METFORMIN HCL 500 MG PO TABS: ORAL_TABLET | ORAL | Status: DC

## 2015-08-17 MED ORDER — ZOSTER VACCINE LIVE 19400 UNT/0.65ML ~~LOC~~ SOLR: 0.6500 mL | Freq: Once | SUBCUTANEOUS | Status: DC

## 2015-08-17 NOTE — Patient Instructions (Addendum)
Ok to continue over the counter hemorrhoid cream. I will refer you to Dr. Hilarie Fredrickson for further evaluation of the loose stools, and bleeding.  Plan on follow up in 3 months for diabetes.  Fasting labs at that time for cholesterol testing.  You should receive a call or letter about your lab results within the next week to 10 days.  Return to the clinic or go to the nearest emergency room if any of your symptoms worsen or new symptoms occur.  Check into cost of shingles vaccine and if ok - can have performed at your pharmacy.   Rectal Bleeding Rectal bleeding is when blood passes out of the anus. It is usually a sign that something is wrong. It may not be serious, but it should always be evaluated. Rectal bleeding may present as bright red blood or extremely dark stools. The color may range from dark red or maroon to black (like tar). It is important that the cause of rectal bleeding be identified so treatment can be started and the problem corrected. CAUSES   Hemorrhoids. These are enlarged (dilated) blood vessels or veins in the anal or rectal area.  Fistulas. Theseare abnormal, burrowing channels that usually run from inside the rectum to the skin around the anus. They can bleed.  Anal fissures. This is a tear in the tissue of the anus. Bleeding occurs with bowel movements.  Diverticulosis. This is a condition in which pockets or sacs project from the bowel wall. Occasionally, the sacs can bleed.  Diverticulitis. Thisis an infection involving diverticulosis of the colon.  Proctitis and colitis. These are conditions in which the rectum, colon, or both, can become inflamed and pitted (ulcerated).  Polyps and cancer. Polyps are non-cancerous (benign) growths in the colon that may bleed. Certain types of polyps turn into cancer.  Protrusion of the rectum. Part of the rectum can project from the anus and bleed.  Certain medicines.  Intestinal infections.  Blood vessel abnormalities. HOME  CARE INSTRUCTIONS  Eat a high-fiber diet to keep your stool soft.  Limit activity.  Drink enough fluids to keep your urine clear or pale yellow.  Warm baths may be useful to soothe rectal pain.  Follow up with your caregiver as directed. SEEK IMMEDIATE MEDICAL CARE IF:  You develop increased bleeding.  You have black or dark red stools.  You vomit blood or material that looks like coffee grounds.  You have abdominal pain or tenderness.  You have a fever.  You feel weak, nauseous, or you faint.  You have severe rectal pain or you are unable to have a bowel movement. MAKE SURE YOU:  Understand these instructions.  Will watch your condition.  Will get help right away if you are not doing well or get worse. Document Released: 05/06/2002 Document Revised: 02/06/2012 Document Reviewed: 05/01/2011 [facility] Patient Information 2015 [place], [place]. This information is not intended to replace advice given to you by your health care Emmerich Cryer. Make sure you discuss any questions you have with your health care Saniyyah Elster.   Hemorrhoids Hemorrhoids are swollen veins around the rectum or anus. There are two types of hemorrhoids:   Internal hemorrhoids. These occur in the veins just inside the rectum. They may poke through to the outside and become irritated and painful.  External hemorrhoids. These occur in the veins outside the anus and can be felt as a painful swelling or hard lump near the anus. CAUSES  Pregnancy.   Obesity.   Constipation or diarrhea.  Straining to have a bowel movement.   Sitting for long periods on the toilet.  Heavy lifting or other activity that caused you to strain.  Anal intercourse. SYMPTOMS   Pain.   Anal itching or irritation.   Rectal bleeding.   Fecal leakage.   Anal swelling.   One or more lumps around the anus.  DIAGNOSIS  Your caregiver may be able to diagnose hemorrhoids by visual examination. Other examinations or  tests that may be performed include:   Examination of the rectal area with a gloved hand (digital rectal exam).   Examination of anal canal using a small tube (scope).   A blood test if you have lost a significant amount of blood.  A test to look inside the colon (sigmoidoscopy or colonoscopy). TREATMENT Most hemorrhoids can be treated at home. However, if symptoms do not seem to be getting better or if you have a lot of rectal bleeding, your caregiver may perform a procedure to help make the hemorrhoids get smaller or remove them completely. Possible treatments include:   Placing a rubber band at the base of the hemorrhoid to cut off the circulation (rubber band ligation).   Injecting a chemical to shrink the hemorrhoid (sclerotherapy).   Using a tool to burn the hemorrhoid (infrared light therapy).   Surgically removing the hemorrhoid (hemorrhoidectomy).   Stapling the hemorrhoid to block blood flow to the tissue (hemorrhoid stapling).  HOME CARE INSTRUCTIONS   Eat foods with fiber, such as whole grains, beans, nuts, fruits, and vegetables. Ask your doctor about taking products with added fiber in them (fibersupplements).  Increase fluid intake. Drink enough water and fluids to keep your urine clear or pale yellow.   Exercise regularly.   Go to the bathroom when you have the urge to have a bowel movement. Do not wait.   Avoid straining to have bowel movements.   Keep the anal area dry and clean. Use wet toilet paper or moist towelettes after a bowel movement.   Medicated creams and suppositories may be used or applied as directed.   Only take over-the-counter or prescription medicines as directed by your caregiver.   Take warm sitz baths for 15-20 minutes, 3-4 times a day to ease pain and discomfort.   Place ice packs on the hemorrhoids if they are tender and swollen. Using ice packs between sitz baths may be helpful.   Put ice in a plastic bag.    Place a towel between your skin and the bag.   Leave the ice on for 15-20 minutes, 3-4 times a day.   Do not use a donut-shaped pillow or sit on the toilet for long periods. This increases blood pooling and pain.  SEEK MEDICAL CARE IF:  You have increasing pain and swelling that is not controlled by treatment or medicine.  You have uncontrolled bleeding.  You have difficulty or you are unable to have a bowel movement.  You have pain or inflammation outside the area of the hemorrhoids. MAKE SURE YOU:  Understand these instructions.  Will watch your condition.  Will get help right away if you are not doing well or get worse. Document Released: 11/11/2000 Document Revised: 10/31/2012 Document Reviewed: 09/18/2012 [facility] Patient Information 2015 [place], [place]. This information is not intended to replace advice given to you by your health care Clytee Heinrich. Make sure you discuss any questions you have with your health care Lubna Stegeman.

## 2015-08-17 NOTE — Progress Notes (Signed)
Subjective:  This chart was scribed for [name], MD by Moises Blood, Medical Scribe. This patient was seen in room 25 and the patient's care was started 4:08 PM.    Patient ID: Dakota Gutierrez, male    DOB: 22-Oct-1951, 64 y.o.   MRN: [id number]  Dakota Gutierrez is a 64 y.o. male He is a new pt to me, previous patient to Dr. Leward Quan, here for few concerns.   DM Recent diagnosis Dec 2015, initial A1C at 8.5,  Prior pre DM. Takes metformin 500 mg QD, he denies any complications with the medication.  Lab Results  Component Value Date   HGBA1C 6.7 01/16/2015   He checks his sugar at home, ranging from 117 to 216, primarily in low-mid 100s.  Wt Readings from Last 3 Encounters:  08/17/15 183 lb 9.6 oz (83.28 kg)  02/16/15 187 lb (84.823 kg)  01/27/15 187 lb 4.8 oz (84.959 kg)   He's been walking 2-3 times a week and do some elliptical twice a week. He's been trying to lose a pound a month, by cutting back on portions and sweets.   Evaluation by nutrition in June 22nd for core class #4.  No results found for: MICROALBUR, [id number] Microalbumin will be checked today; not fasting today, will check another day.   Lab Results  Component Value Date   CHOL 176 10/28/2014   HDL 31* 10/28/2014   LDLCALC NOT CALC 10/28/2014   LDLDIRECT 90.5 08/06/2013   TRIG 451* 10/28/2014   CHOLHDL 5.7 10/28/2014   Ophthal: last seen within this year, no concerns.   Hemorrhoids He had colonoscopy back in May 2013 by Dr. Hilarie Fredrickson for rectal bleeding at the time. There were a few shallow ulcers in the terminal ilium. Did notice external hemorrhoids at that time. Last treated in June 2013 by Dr. Hilarie Fredrickson. He believes it hasn't gotten better with July being the worst, and he goes to bathroom 4-5 times a day. It becomes looser as the day goes on. He has some painful bowel movements with bright red blood in the stool, towards the end of the BM. He denies a difference between metformin. He  feels the hemorrhoids on the outside. He notes that if he eats fried foods, the hemorrhoids become worse. He denies lightheadedness.   Ventricular Tachycardia Has dual implantable cardioverter-defibrillator followed by Dr. Caryl Comes. Last remote transition in June and office visit with Dr. Caryl Comes. H/o V tac in the past non sustained.   Immunizations/Health Maintenance He received a flu shot today.  He received a shingles vaccine today.     Patient Active Problem List   Diagnosis Date Noted  . Type II diabetes mellitus 01/16/2015  . Hypersomnolence 08/14/2012  . History of Helicobacter pylori infection 04/16/2012  . GERD (gastroesophageal reflux disease) 04/16/2012  . Elevated transaminase level 02/02/2012  . Obesity 12/29/2011  . 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 Syncope   . Ventricular tachycardia   . Anxiety    Past Medical History  Diagnosis Date  . Syncope   . Ventricular tachycardia     storm following ICD implant on amiodarone   . HTN (hypertension)   . Dual implantable cardiac defibrillator in situ     Medtronic [id number] Protecta  . 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 Elevated transaminase level     10/2011  ? amio  . Other primary cardiomyopathies   . Diabetes mellitus without complication   . GERD (gastroesophageal  reflux disease)    Past Surgical History  Procedure Laterality Date  . Cardiac defibrillator placement      Medtronic [id number] Protecta  . Tonsillectomy and adenoidectomy    . Gallbladder surgery    . Hernia repair    . Cholecystectomy     Allergies  Allergen Reactions  . Adhesive [Tape]     Swelling and welts formed   Prior to Admission medications   Medication Sig Start Date End Date Taking? Authorizing Provider  amiodarone (PACERONE) 200 MG tablet Take 0.5 tablets (100 mg total) by mouth daily. 03/25/15   [name], MD  aspirin EC 81 MG tablet Take 1 tablet (81 mg total) by mouth daily. 11/11/11    [name], MD  Cholecalciferol (VITAMIN D) 2000 UNITS tablet Take 2,000 Units by mouth daily.    Historical Provider, MD  famotidine (PEPCID) 20 MG tablet Take 1 tablet (20 mg total) by mouth 2 (two) times daily as needed. Patient taking differently: Take 20 mg by mouth 2 (two) times daily as needed for heartburn or indigestion.  04/16/12   Jerene Bears, MD  losartan-hydrochlorothiazide (HYZAAR) 100-12.5 MG per tablet Take 1 tablet by mouth daily. 10/28/14   Barton Fanny, MD  metFORMIN (GLUCOPHAGE) 500 MG tablet Take 1 tablet once a day with largest meal. 11/07/14   Barton Fanny, MD  metoprolol (LOPRESSOR) 50 MG tablet TAKE ONE TABLET BY MOUTH TWICE DAILY 03/25/15   [name], MD  Omega-3 Fatty Acids (FISH OIL) 1000 MG CAPS Take 1 capsule by mouth 2 (two) times daily.     Historical Provider, MD  tadalafil (CIALIS) 20 MG tablet Take 1 tablet (20 mg total) by mouth daily as needed. Patient taking differently: Take 20 mg by mouth daily as needed for erectile dysfunction.  05/16/14   Barton Fanny, MD  valACYclovir (VALTREX) 1000 MG tablet Take 1 tablet (1,000 mg total) by mouth daily. 10/28/14   Barton Fanny, MD   Social History   Social History  . Marital Status: Unknown    Spouse Name: N/A  . Number of Children: N/A  . Years of Education: N/A   Occupational History  . Retired Pharmacist, hospital    Social History Main Topics  . Smoking status: Never Smoker   . Smokeless tobacco: Never Used  . Alcohol Use: 1.2 oz/week    2 Cans of beer per week     Comment: 4/week  . Drug Use: No  . Sexual Activity: Yes   Other Topics Concern  . Not on file   Social History Narrative   Divorced. Education: [employer]. Exercise: walking 3 times a week for 30 minutes.   Retired Pharmacist, hospital    Review of Systems  Gastrointestinal: Positive for diarrhea, blood in stool and rectal pain.  Neurological: Negative for light-headedness.       Objective:   Physical Exam    Constitutional: He is oriented to person, place, and time. He appears well-developed and well-nourished.  HENT:  Head: Normocephalic and atraumatic.  Eyes: EOM are normal. Pupils are equal, round, and reactive to light.  Neck: No JVD present. Carotid bruit is not present.  Cardiovascular: Normal rate, regular rhythm and normal heart sounds.   No murmur heard. Pulmonary/Chest: Effort normal and breath sounds normal. He has no rales.  Abdominal: Soft. He exhibits no distension. There is no tenderness.  Genitourinary:  3 external hemorrhoids, flat, not thrombose, no bleeding noted  Musculoskeletal: He exhibits no edema.  No significant calluses, except for 1st mtp otherwise no skin breakdown  Neurological: He is alert and oriented to person, place, and time.  Skin: Skin is warm and dry.  Psychiatric: He has a normal mood and affect.  Vitals reviewed.   Filed Vitals:   08/17/15 1552  BP: 104/66  Pulse: 96  Temp: 98.2 F (36.8 C)  TempSrc: Oral  Resp: 16  Height: 5\' 5"  (1.651 m)  Weight: 183 lb 9.6 oz (83.28 kg)  SpO2: 97%         Assessment & Plan:   By signing my name below, I, Moises Blood, attest that this documentation has been prepared under the direction and in the presence of [name], MD. Electronically Signed: Moises Blood, [place]. 08/17/2015 , 4:22 PM .   Dakota Gutierrez is a 64 y.o. male Need for influenza vaccination - Plan: Flu Vaccine QUAD 36+ mos IM given  Need for shingles vaccine - Plan: zoster vaccine live, PF, (ZOSTAVAX) 47096 UNT/0.65ML injection prescription given to have filled a pharmacy.  Type 2 diabetes mellitus without complication - Plan: metFORMIN (GLUCOPHAGE) 500 MG tablet, Hemoglobin G8Z, Basic metabolic panel, Microalbumin, urine  -A1c drawn, continue metformin 500 mg daily for now, diet and exercise.  Hemorrhoids, unspecified hemorrhoid type - Plan: Ambulatory referral to Gastroenterology Loose stools - Plan: Ambulatory  referral to Gastroenterology [place] - Plan: Ambulatory referral to Gastroenterology Rectal bleeding - Plan: Ambulatory referral to Gastroenterology, CBC  -Previous treatment for similar symptoms by gastroenterology. Can continue over-the-counter Anusol or other hemorrhoidal cream for itching or discomfort. Referred back to gastroenterology. RTC precautions if acute worsening.  Meds ordered this encounter  Medications  . zoster vaccine live, PF, (ZOSTAVAX) 66294 UNT/0.65ML injection    Sig: Inject 19,400 Units into the skin once.    Dispense:  1 each    Refill:  0  . metFORMIN (GLUCOPHAGE) 500 MG tablet    Sig: Take 1 tablet once a day with largest meal.    Dispense:  90 tablet    Refill:  1   Patient Instructions  Ok to continue over the counter hemorrhoid cream. I will refer you to Dr. Hilarie Fredrickson for further evaluation of the loose stools, and bleeding.  Plan on follow up in 3 months for diabetes.  Fasting labs at that time for cholesterol testing.  You should receive a call or letter about your lab results within the next week to 10 days.  Return to the clinic or go to the nearest emergency room if any of your symptoms worsen or new symptoms occur.  Check into cost of shingles vaccine and if ok - can have performed at your pharmacy.   Rectal Bleeding Rectal bleeding is when blood passes out of the anus. It is usually a sign that something is wrong. It may not be serious, but it should always be evaluated. Rectal bleeding may present as bright red blood or extremely dark stools. The color may range from dark red or maroon to black (like tar). It is important that the cause of rectal bleeding be identified so treatment can be started and the problem corrected. CAUSES   Hemorrhoids. These are enlarged (dilated) blood vessels or veins in the anal or rectal area.  Fistulas. Theseare abnormal, burrowing channels that usually run from inside the rectum to the skin around the anus. They can  bleed.  Anal fissures. This is a tear in the tissue of the anus. Bleeding occurs with bowel movements.  Diverticulosis. This is  a condition in which pockets or sacs project from the bowel wall. Occasionally, the sacs can bleed.  Diverticulitis. Thisis an infection involving diverticulosis of the colon.  Proctitis and colitis. These are conditions in which the rectum, colon, or both, can become inflamed and pitted (ulcerated).  Polyps and cancer. Polyps are non-cancerous (benign) growths in the colon that may bleed. Certain types of polyps turn into cancer.  Protrusion of the rectum. Part of the rectum can project from the anus and bleed.  Certain medicines.  Intestinal infections.  Blood vessel abnormalities. HOME CARE INSTRUCTIONS  Eat a high-fiber diet to keep your stool soft.  Limit activity.  Drink enough fluids to keep your urine clear or pale yellow.  Warm baths may be useful to soothe rectal pain.  Follow up with your caregiver as directed. SEEK IMMEDIATE MEDICAL CARE IF:  You develop increased bleeding.  You have black or dark red stools.  You vomit blood or material that looks like coffee grounds.  You have abdominal pain or tenderness.  You have a fever.  You feel weak, nauseous, or you faint.  You have severe rectal pain or you are unable to have a bowel movement. MAKE SURE YOU:  Understand these instructions.  Will watch your condition.  Will get help right away if you are not doing well or get worse. Document Released: 05/06/2002 Document Revised: 02/06/2012 Document Reviewed: 05/01/2011 [facility] Patient Information 2015 [place], [place]. This information is not intended to replace advice given to you by your health care provider. Make sure you discuss any questions you have with your health care provider.   Hemorrhoids Hemorrhoids are swollen veins around the rectum or anus. There are two types of hemorrhoids:   Internal hemorrhoids. These  occur in the veins just inside the rectum. They may poke through to the outside and become irritated and painful.  External hemorrhoids. These occur in the veins outside the anus and can be felt as a painful swelling or hard lump near the anus. CAUSES  Pregnancy.   Obesity.   Constipation or diarrhea.   Straining to have a bowel movement.   Sitting for long periods on the toilet.  Heavy lifting or other activity that caused you to strain.  Anal intercourse. SYMPTOMS   Pain.   Anal itching or irritation.   Rectal bleeding.   Fecal leakage.   Anal swelling.   One or more lumps around the anus.  DIAGNOSIS  Your caregiver may be able to diagnose hemorrhoids by visual examination. Other examinations or tests that may be performed include:   Examination of the rectal area with a gloved hand (digital rectal exam).   Examination of anal canal using a small tube (scope).   A blood test if you have lost a significant amount of blood.  A test to look inside the colon (sigmoidoscopy or colonoscopy). TREATMENT Most hemorrhoids can be treated at home. However, if symptoms do not seem to be getting better or if you have a lot of rectal bleeding, your caregiver may perform a procedure to help make the hemorrhoids get smaller or remove them completely. Possible treatments include:   Placing a rubber band at the base of the hemorrhoid to cut off the circulation (rubber band ligation).   Injecting a chemical to shrink the hemorrhoid (sclerotherapy).   Using a tool to burn the hemorrhoid (infrared light therapy).   Surgically removing the hemorrhoid (hemorrhoidectomy).   Stapling the hemorrhoid to block blood flow to the tissue (hemorrhoid  stapling).  HOME CARE INSTRUCTIONS   Eat foods with fiber, such as whole grains, beans, nuts, fruits, and vegetables. Ask your doctor about taking products with added fiber in them (fibersupplements).  Increase fluid intake.  Drink enough water and fluids to keep your urine clear or pale yellow.   Exercise regularly.   Go to the bathroom when you have the urge to have a bowel movement. Do not wait.   Avoid straining to have bowel movements.   Keep the anal area dry and clean. Use wet toilet paper or moist towelettes after a bowel movement.   Medicated creams and suppositories may be used or applied as directed.   Only take over-the-counter or prescription medicines as directed by your caregiver.   Take warm sitz baths for 15-20 minutes, 3-4 times a day to ease pain and discomfort.   Place ice packs on the hemorrhoids if they are tender and swollen. Using ice packs between sitz baths may be helpful.   Put ice in a plastic bag.   Place a towel between your skin and the bag.   Leave the ice on for 15-20 minutes, 3-4 times a day.   Do not use a donut-shaped pillow or sit on the toilet for long periods. This increases blood pooling and pain.  SEEK MEDICAL CARE IF:  You have increasing pain and swelling that is not controlled by treatment or medicine.  You have uncontrolled bleeding.  You have difficulty or you are unable to have a bowel movement.  You have pain or inflammation outside the area of the hemorrhoids. MAKE SURE YOU:  Understand these instructions.  Will watch your condition.  Will get help right away if you are not doing well or get worse. Document Released: 11/11/2000 Document Revised: 10/31/2012 Document Reviewed: 09/18/2012 [facility] Patient Information 2015 [place], [place]. This information is not intended to replace advice given to you by your health care provider. Make sure you discuss any questions you have with your health care provider.      I personally performed the services described in this documentation, which was scribed in my presence. The recorded information has been reviewed and considered, and addended by me as needed.

## 2015-08-18 LAB — MICROALBUMIN, URINE

## 2015-08-18 NOTE — Progress Notes (Signed)
Remote ICD transmission.   

## 2015-08-25 LAB — CUP PACEART REMOTE DEVICE CHECK
Battery Voltage: 2.89 V
Brady Statistic AS VP Percent: 0.03 %
Brady Statistic AS VS Percent: 35.42 %
Date Time Interrogation Session: 20160919073431
HIGH POWER IMPEDANCE MEASURED VALUE: 44 Ohm
HighPow Impedance: 285 Ohm
HighPow Impedance: 38 Ohm
Lead Channel Impedance Value: 399 Ohm
Lead Channel Pacing Threshold Pulse Width: 0.4 ms
Lead Channel Sensing Intrinsic Amplitude: 1.5 mV
MDC IDC MSMT LEADCHNL RA PACING THRESHOLD AMPLITUDE: 0.625 V
MDC IDC MSMT LEADCHNL RA SENSING INTR AMPL: 1.5 mV
MDC IDC MSMT LEADCHNL RV IMPEDANCE VALUE: 380 Ohm
MDC IDC MSMT LEADCHNL RV PACING THRESHOLD AMPLITUDE: 0.625 V
MDC IDC MSMT LEADCHNL RV PACING THRESHOLD PULSEWIDTH: 0.4 ms
MDC IDC MSMT LEADCHNL RV SENSING INTR AMPL: 10.875 mV
MDC IDC MSMT LEADCHNL RV SENSING INTR AMPL: 10.875 mV
MDC IDC SET LEADCHNL RA PACING AMPLITUDE: 2 V
MDC IDC SET LEADCHNL RV PACING AMPLITUDE: 2.5 V
MDC IDC SET LEADCHNL RV PACING PULSEWIDTH: 0.4 ms
MDC IDC SET LEADCHNL RV SENSING SENSITIVITY: 0.3 mV
MDC IDC SET ZONE DETECTION INTERVAL: 350 ms
MDC IDC SET ZONE DETECTION INTERVAL: 450 ms
MDC IDC STAT BRADY AP VP PERCENT: 1.08 %
MDC IDC STAT BRADY AP VS PERCENT: 63.48 %
MDC IDC STAT BRADY RA PERCENT PACED: 64.56 %
MDC IDC STAT BRADY RV PERCENT PACED: 1.11 %
Zone Setting Detection Interval: 320 ms
Zone Setting Detection Interval: 400 ms

## 2015-09-08 ENCOUNTER — Encounter: Payer: Self-pay | Admitting: Cardiology

## 2015-09-22 ENCOUNTER — Encounter: Payer: Self-pay | Admitting: *Deleted

## 2015-09-22 ENCOUNTER — Encounter: Payer: Self-pay | Admitting: Cardiology

## 2015-10-08 ENCOUNTER — Encounter: Payer: Self-pay | Admitting: Internal Medicine

## 2015-10-12 ENCOUNTER — Ambulatory Visit: Payer: Medicare Other | Admitting: Internal Medicine

## 2015-11-12 ENCOUNTER — Encounter: Payer: Self-pay | Admitting: Family Medicine

## 2015-11-16 ENCOUNTER — Ambulatory Visit (INDEPENDENT_AMBULATORY_CARE_PROVIDER_SITE_OTHER): Payer: Medicare Other | Admitting: *Deleted

## 2015-11-16 DIAGNOSIS — I472 Ventricular tachycardia, unspecified: Secondary | ICD-10-CM

## 2015-11-16 NOTE — Progress Notes (Signed)
Remote ICD transmission.   

## 2015-11-17 ENCOUNTER — Other Ambulatory Visit: Payer: Self-pay | Admitting: *Deleted

## 2015-11-17 ENCOUNTER — Other Ambulatory Visit: Payer: Self-pay | Admitting: Family Medicine

## 2015-11-17 LAB — CUP PACEART REMOTE DEVICE CHECK
Battery Voltage: 2.8 V
Brady Statistic AP VP Percent: 4.65 %
Brady Statistic AP VS Percent: 73.66 %
Brady Statistic AS VP Percent: 0.03 %
Brady Statistic RV Percent Paced: 4.68 %
Date Time Interrogation Session: 20161219083528
HIGH POWER IMPEDANCE MEASURED VALUE: 342 Ohm
HIGH POWER IMPEDANCE MEASURED VALUE: 40 Ohm
HIGH POWER IMPEDANCE MEASURED VALUE: 49 Ohm
Implantable Lead Implant Date: 20120531
Implantable Lead Location: 753859
Lead Channel Impedance Value: 399 Ohm
Lead Channel Pacing Threshold Amplitude: 0.625 V
Lead Channel Pacing Threshold Amplitude: 0.75 V
Lead Channel Pacing Threshold Pulse Width: 0.4 ms
Lead Channel Sensing Intrinsic Amplitude: 12.75 mV
Lead Channel Sensing Intrinsic Amplitude: 12.75 mV
Lead Channel Setting Pacing Amplitude: 2 V
Lead Channel Setting Pacing Amplitude: 2.5 V
Lead Channel Setting Pacing Pulse Width: 0.4 ms
Lead Channel Setting Sensing Sensitivity: 0.3 mV
MDC IDC LEAD IMPLANT DT: 20120531
MDC IDC LEAD LOCATION: 753860
MDC IDC MSMT LEADCHNL RA PACING THRESHOLD PULSEWIDTH: 0.4 ms
MDC IDC MSMT LEADCHNL RA SENSING INTR AMPL: 2 mV
MDC IDC MSMT LEADCHNL RA SENSING INTR AMPL: 2 mV
MDC IDC MSMT LEADCHNL RV IMPEDANCE VALUE: 399 Ohm
MDC IDC STAT BRADY AS VS PERCENT: 21.66 %
MDC IDC STAT BRADY RA PERCENT PACED: 78.31 %

## 2015-11-17 MED ORDER — LOSARTAN POTASSIUM-HCTZ 100-12.5 MG PO TABS: 1.0000 | ORAL_TABLET | Freq: Every day | ORAL | Status: DC

## 2015-11-17 NOTE — Telephone Encounter (Signed)
PHARMACY CALLED FOR RX REFILL ON LOSARTAN/HCTZ AND METFORMIN.  SHOULD HAVE RX ON FILE FROM 08/17/15 FOR METFORMIN AND LOSARTAN FILLED #30 NO RFS NEEDS OFFICE VISIT

## 2015-11-19 ENCOUNTER — Encounter: Payer: Self-pay | Admitting: Cardiology

## 2015-12-03 ENCOUNTER — Encounter: Payer: Self-pay | Admitting: Cardiology

## 2015-12-14 ENCOUNTER — Ambulatory Visit: Payer: Medicare Other | Admitting: Internal Medicine

## 2015-12-24 ENCOUNTER — Other Ambulatory Visit: Payer: Self-pay | Admitting: Internal Medicine

## 2016-01-22 ENCOUNTER — Encounter: Payer: Self-pay | Admitting: Internal Medicine

## 2016-01-28 LAB — HM DIABETES EYE EXAM

## 2016-02-14 ENCOUNTER — Other Ambulatory Visit: Payer: Self-pay | Admitting: Family Medicine

## 2016-03-01 ENCOUNTER — Encounter: Payer: Medicare Other | Admitting: Internal Medicine

## 2016-03-02 ENCOUNTER — Encounter: Payer: Self-pay | Admitting: *Deleted

## 2016-03-02 ENCOUNTER — Encounter: Payer: Self-pay | Admitting: Family Medicine

## 2016-03-02 ENCOUNTER — Ambulatory Visit (INDEPENDENT_AMBULATORY_CARE_PROVIDER_SITE_OTHER): Payer: Medicare Other | Admitting: Family Medicine

## 2016-03-02 VITALS — BP 120/80 | HR 80 | Temp 98.2°F | Resp 16 | Ht 65.0 in | Wt 189.0 lb

## 2016-03-02 DIAGNOSIS — N529 Male erectile dysfunction, unspecified: Secondary | ICD-10-CM | POA: Diagnosis not present

## 2016-03-02 DIAGNOSIS — A6000 Herpesviral infection of urogenital system, unspecified: Secondary | ICD-10-CM | POA: Diagnosis not present

## 2016-03-02 DIAGNOSIS — Z13220 Encounter for screening for lipoid disorders: Secondary | ICD-10-CM

## 2016-03-02 DIAGNOSIS — E119 Type 2 diabetes mellitus without complications: Secondary | ICD-10-CM | POA: Diagnosis not present

## 2016-03-02 DIAGNOSIS — Z87898 Personal history of other specified conditions: Secondary | ICD-10-CM | POA: Diagnosis not present

## 2016-03-02 DIAGNOSIS — Z8719 Personal history of other diseases of the digestive system: Secondary | ICD-10-CM

## 2016-03-02 LAB — COMPLETE METABOLIC PANEL WITH GFR
ALT: 28 U/L (ref 9–46)
AST: 32 U/L (ref 10–35)
Albumin: 4.3 g/dL (ref 3.6–5.1)
Alkaline Phosphatase: 49 U/L (ref 40–115)
BUN: 14 mg/dL (ref 7–25)
CHLORIDE: 104 mmol/L (ref 98–110)
CO2: 23 mmol/L (ref 20–31)
CREATININE: 1.01 mg/dL (ref 0.70–1.25)
Calcium: 9.1 mg/dL (ref 8.6–10.3)
GFR, EST AFRICAN AMERICAN: 89 mL/min (ref >=60)
GFR, Est Non African American: 77 mL/min (ref >=60)
GLUCOSE: 105 mg/dL — AB (ref 65–99)
Potassium: 4.2 mmol/L (ref 3.5–5.3)
SODIUM: 138 mmol/L (ref 135–146)
Total Bilirubin: 0.6 mg/dL (ref 0.2–1.2)
Total Protein: 6.8 g/dL (ref 6.1–8.1)

## 2016-03-02 LAB — LIPID PANEL
Cholesterol: 121 mg/dL — ABNORMAL LOW (ref 125–200)
HDL: 23 mg/dL — ABNORMAL LOW (ref >=40)
LDL CALC: 37 mg/dL (ref <130)
TRIGLYCERIDES: 303 mg/dL — AB (ref <150)
Total CHOL/HDL Ratio: 5.3 Ratio — ABNORMAL HIGH (ref <=5.0)
VLDL: 61 mg/dL — AB (ref <30)

## 2016-03-02 LAB — POCT GLYCOSYLATED HEMOGLOBIN (HGB A1C): HEMOGLOBIN A1C: 6.1

## 2016-03-02 LAB — GLUCOSE, POCT (MANUAL RESULT ENTRY): POC GLUCOSE: 112 mg/dL — AB (ref 70–99)

## 2016-03-02 MED ORDER — TADALAFIL 20 MG PO TABS: 10.0000 mg | ORAL_TABLET | Freq: Every day | ORAL | Status: DC | PRN

## 2016-03-02 MED ORDER — VALACYCLOVIR HCL 1 G PO TABS: 1000.0000 mg | ORAL_TABLET | Freq: Every day | ORAL | Status: DC

## 2016-03-02 MED ORDER — METFORMIN HCL 500 MG PO TABS
ORAL_TABLET | ORAL | Status: DC
Start: 2016-03-02 — End: 2016-09-15

## 2016-03-02 NOTE — Progress Notes (Addendum)
Subjective:    Patient ID: Dakota Gutierrez, male    DOB: 05/19/1951, 65 y.o.   MRN: [id number] By signing my name below, I, Zola Button, attest that this documentation has been prepared under the direction and in the presence of [name], MD.  Electronically Signed: Zola Button, Medical Scribe. 03/02/2016. 11:36 AM.  HPI HPI Comments: Dakota Gutierrez is a 65 y.o. male with a history of DM who presents to the [facility] for a follow-up. Established with me in Sep of last year for primary care. Patient is fasting today.  Diabetes: He has been seen by nutritionist in the past. He takes metformin 500 mg qd. Diet and exercise were discussed last visit. Patient denies any side effects. He exercises about 3-4 days a week, not including yard work. He denies chest pain and SOB with exercise. He did see his eye doctor in the past year; his eyes were found to be normal. Lab Results  Component Value Date   HGBA1C 6.1* 08/17/2015   Wt Readings from Last 3 Encounters:  03/02/16 189 lb (85.73 kg)  08/17/15 183 lb 9.6 oz (83.28 kg)  02/16/15 187 lb (84.823 kg)   Lab Results  Component Value Date   MICROALBUR <0.2 08/17/2015    History of ventricular tachycardia: He has an implantable defibrillator. Cardiologist is Dr. Caryl Comes. Does take amiodarone. He has an appointment with Dr. Caryl Comes on 4/18.  Hemorrhoids: Patient has been having some problems with hemorrhoids, but he has noticed significant improvement with diet changes and increasing his fiber intake. He did not end up seeing Dr. Hilarie Fredrickson as a result. He still has some occasional issues due to hemorrhoids.  Hx of genital HSV No flairs in years, taking valtrex QD.   Erectile dysfunction Uses 1/2 cialis few times per month. No vision or hearing changes, no chest pain/lightheadedness or other new side effects and cardiologist ok with him taking this.   Patient Active Problem List   Diagnosis Date Noted  . Type II  diabetes mellitus ([place]) 01/16/2015  . Hypersomnolence 08/14/2012  . History of Helicobacter pylori infection 04/16/2012  . GERD (gastroesophageal reflux disease) 04/16/2012  . Elevated transaminase level 02/02/2012  . Obesity 12/29/2011  . 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 Syncope   . Ventricular tachycardia ([place])   . Anxiety    Past Medical History  Diagnosis Date  . Syncope   . Ventricular tachycardia ([place])     storm following ICD implant on amiodarone   . HTN (hypertension)   . Dual implantable cardiac defibrillator in situ     Medtronic [id number] Protecta  . 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 Elevated transaminase level     10/2011  ? amio  . Other primary cardiomyopathies   . Diabetes mellitus without complication ([place])   . GERD (gastroesophageal reflux disease)   . Tubular adenoma of colon    Past Surgical History  Procedure Laterality Date  . Cardiac defibrillator placement      Medtronic [id number] Protecta  . Tonsillectomy and adenoidectomy    . Gallbladder surgery    . Hernia repair    . Cholecystectomy     Allergies  Allergen Reactions  . Adhesive [Tape]     Swelling and welts formed   Prior to Admission medications   Medication Sig Start Date End Date Taking? Authorizing Provider  amiodarone (PACERONE) 200 MG tablet Take 0.5 tablets (100 mg total) by mouth  daily. 03/25/15  Yes [name], MD  aspirin EC 81 MG tablet Take 1 tablet (81 mg total) by mouth daily. 11/11/11  Yes [name], MD  Cholecalciferol (VITAMIN D) 2000 UNITS tablet Take 2,000 Units by mouth daily.   Yes Historical Provider, MD  famotidine (PEPCID) 20 MG tablet Take 1 tablet (20 mg total) by mouth 2 (two) times daily as needed. Patient taking differently: Take 20 mg by mouth 2 (two) times daily as needed for heartburn or indigestion.  04/16/12  Yes Jerene Bears, MD  losartan-hydrochlorothiazide (HYZAAR) 100-12.5 MG tablet TAKE 1 TABLET BY MOUTH  DAILY. 02/14/16  Yes Wendie Agreste, MD  metFORMIN (GLUCOPHAGE) 500 MG tablet TAKE 1 TABLET ONCE A DAY WITH LARGEST MEAL. 02/14/16  Yes Wendie Agreste, MD  metoprolol (LOPRESSOR) 50 MG tablet TAKE ONE TABLET BY MOUTH TWICE DAILY 12/24/15  Yes [name], MD  Omega-3 Fatty Acids (FISH OIL) 1000 MG CAPS Take 1 capsule by mouth 2 (two) times daily.    Yes Historical Provider, MD  tadalafil (CIALIS) 20 MG tablet Take 1 tablet (20 mg total) by mouth daily as needed. Patient taking differently: Take 20 mg by mouth daily as needed for erectile dysfunction.  05/16/14  Yes Barton Fanny, MD  valACYclovir (VALTREX) 1000 MG tablet Take 1 tablet (1,000 mg total) by mouth daily. 10/28/14  Yes Barton Fanny, MD  zoster vaccine live, PF, (ZOSTAVAX) 16109 UNT/0.65ML injection Inject 19,400 Units into the skin once. 08/17/15  Yes Wendie Agreste, MD   Social History   Social History  . Marital Status: Unknown    Spouse Name: N/A  . Number of Children: N/A  . Years of Education: N/A   Occupational History  . Retired Pharmacist, hospital    Social History Main Topics  . Smoking status: Never Smoker   . Smokeless tobacco: Never Used  . Alcohol Use: 1.2 oz/week    2 Cans of beer per week     Comment: 4/week  . Drug Use: No  . Sexual Activity: Yes   Other Topics Concern  . Not on file   Social History Narrative   Divorced. Education: [employer]. Exercise: walking 3 times a week for 30 minutes.   Retired Pharmacist, hospital     Review of Systems  Constitutional: Negative for fatigue and unexpected weight change.  Eyes: Negative for visual disturbance.  Respiratory: Negative for cough, chest tightness and shortness of breath.   Cardiovascular: Negative for chest pain, palpitations and leg swelling.  Gastrointestinal: Negative for abdominal pain and blood in stool.  Neurological: Negative for dizziness, light-headedness and headaches.       Objective:   Physical Exam  Constitutional: He is oriented to  person, place, and time. He appears well-developed and well-nourished.  HENT:  Head: Normocephalic and atraumatic.  Eyes: EOM are normal. Pupils are equal, round, and reactive to light.  Neck: No JVD present. Carotid bruit is not present.  Cardiovascular: Normal rate, regular rhythm and normal heart sounds.   No murmur heard. Pulmonary/Chest: Effort normal and breath sounds normal. He has no rales.  Musculoskeletal: He exhibits no edema.  Neurological: He is alert and oriented to person, place, and time.  Skin: Skin is warm and dry.  Psychiatric: He has a normal mood and affect.  Vitals reviewed.   Filed Vitals:   03/02/16 1049 03/02/16 1100  BP: 120/80   Pulse: 103 80  Temp: 98.2 F (36.8 C)   TempSrc: Oral  Resp: 16   Height: 5\' 5"  (1.651 m)   Weight: 189 lb (85.73 kg)   SpO2: 98%     Results for orders placed or performed in visit on 03/02/16  POCT glucose (manual entry)  Result Value Ref Range   POC Glucose 112 (A) 70 - 99 mg/dl  POCT glycosylated hemoglobin (Hb A1C)  Result Value Ref Range   Hemoglobin A1C 6.1         Assessment & Plan:   Dakota Gutierrez is a 65 y.o. male Controlled type 2 diabetes mellitus without complication, without long-term current use of insulin ([place]) - Plan: HM Diabetes Foot Exam, POCT glucose (manual entry), POCT glycosylated hemoglobin (Hb A1C), COMPLETE METABOLIC PANEL WITH GFR, metFORMIN (GLUCOPHAGE) 500 MG tablet  -Controlled. Commended on diet and exercise, and discussed if remaining this low, could consider D/C metformin, but for now we'll continue metformin 500 mg daily. Recheck 6 months. Screening for hyperlipidemia - Plan: COMPLETE METABOLIC PANEL WITH GFR, Lipid panel  History of hemorrhoids  - Continue fiber in the diet and fluids to lessen chance of recurrence. Info given on after visit summary, RTC precautions.  Genital HSV - Plan: valACYclovir (VALTREX) 1000 MG tablet  -Refilled Valtrex. Stable. Consider 500 mg daily  as no flares in some time.  Erectile dysfunction, unspecified erectile dysfunction type - Plan: tadalafil (CIALIS) 20 MG tablet  -Cialis Rx given - use lowest effective dose. Side effects discussed (including but not limited to headache/flushing, blue discoloration of vision, possible vascular steal and risk of cardiac effects if underlying unknown coronary artery disease). Understanding expressed.   Meds ordered this encounter  Medications  . metFORMIN (GLUCOPHAGE) 500 MG tablet    Sig: TAKE 1 TABLET ONCE A DAY WITH LARGEST MEAL.    Dispense:  90 tablet    Refill:  2  . tadalafil (CIALIS) 20 MG tablet    Sig: Take 0.5-1 tablets (10-20 mg total) by mouth daily as needed for erectile dysfunction.    Dispense:  10 tablet    Refill:  11    Can place on hold if not needed currently.  . valACYclovir (VALTREX) 1000 MG tablet    Sig: Take 1 tablet (1,000 mg total) by mouth daily.    Dispense:  30 tablet    Refill:  11    Can place on hold if not currently needed   Patient Instructions       IF you received an x-ray today, you will receive an invoice from [facility]. Please contact [facility] at [phone] with questions or concerns regarding your invoice.   IF you received labwork today, you will receive an invoice from [employer]. Please contact [facility] at [phone] with questions or concerns regarding your invoice.   Our billing staff will not be able to assist you with questions regarding bills from these companies.  You will be contacted with the lab results as soon as they are available. The fastest way to get your results is to activate your My Chart account. Instructions are located on the last page of this paperwork. If you have not heard from [name] regarding the results in 2 weeks, please contact this office.    Continue fiber and diet approach to help with hemorrhoid prevention, but if persistent - let me know and I can refer  you to other gastroenterologist.  Let me know if new referral is needed.  Keep up the good work with exercise and diet to help manage  diabetes. Follow up in 6 months.    Hemorrhoids Hemorrhoids are swollen veins around the rectum or anus. There are two types of hemorrhoids:   Internal hemorrhoids. These occur in the veins just inside the rectum. They may poke through to the outside and become irritated and painful.  External hemorrhoids. These occur in the veins outside the anus and can be felt as a painful swelling or hard lump near the anus. CAUSES  Pregnancy.   Obesity.   Constipation or diarrhea.   Straining to have a bowel movement.   Sitting for long periods on the toilet.  Heavy lifting or other activity that caused you to strain.  Anal intercourse. SYMPTOMS   Pain.   Anal itching or irritation.   Rectal bleeding.   Fecal leakage.   Anal swelling.   One or more lumps around the anus.  DIAGNOSIS  Your caregiver may be able to diagnose hemorrhoids by visual examination. Other examinations or tests that may be performed include:   Examination of the rectal area with a gloved hand (digital rectal exam).   Examination of anal canal using a small tube (scope).   A blood test if you have lost a significant amount of blood.  A test to look inside the colon (sigmoidoscopy or colonoscopy). TREATMENT Most hemorrhoids can be treated at home. However, if symptoms do not seem to be getting better or if you have a lot of rectal bleeding, your caregiver may perform a procedure to help make the hemorrhoids get smaller or remove them completely. Possible treatments include:   Placing a rubber band at the base of the hemorrhoid to cut off the circulation (rubber band ligation).   Injecting a chemical to shrink the hemorrhoid (sclerotherapy).   Using a tool to burn the hemorrhoid (infrared light therapy).   Surgically removing the hemorrhoid  (hemorrhoidectomy).   Stapling the hemorrhoid to block blood flow to the tissue (hemorrhoid stapling).  HOME CARE INSTRUCTIONS   Eat foods with fiber, such as whole grains, beans, nuts, fruits, and vegetables. Ask your doctor about taking products with added fiber in them (fibersupplements).  Increase fluid intake. Drink enough water and fluids to keep your urine clear or pale yellow.   Exercise regularly.   Go to the bathroom when you have the urge to have a bowel movement. Do not wait.   Avoid straining to have bowel movements.   Keep the anal area dry and clean. Use wet toilet paper or moist towelettes after a bowel movement.   Medicated creams and suppositories may be used or applied as directed.   Only take over-the-counter or prescription medicines as directed by your caregiver.   Take warm sitz baths for 15-20 minutes, 3-4 times a day to ease pain and discomfort.   Place ice packs on the hemorrhoids if they are tender and swollen. Using ice packs between sitz baths may be helpful.   Put ice in a plastic bag.   Place a towel between your skin and the bag.   Leave the ice on for 15-20 minutes, 3-4 times a day.   Do not use a donut-shaped pillow or sit on the toilet for long periods. This increases blood pooling and pain.  SEEK MEDICAL CARE IF:  You have increasing pain and swelling that is not controlled by treatment or medicine.  You have uncontrolled bleeding.  You have difficulty or you are unable to have a bowel movement.  You have pain or inflammation outside the area of  the hemorrhoids. MAKE SURE YOU:  Understand these instructions.  Will watch your condition.  Will get help right away if you are not doing well or get worse.   This information is not intended to replace advice given to you by your health care provider. Make sure you discuss any questions you have with your health care provider.   Document Released: 11/11/2000 Document  Revised: 10/31/2012 Document Reviewed: 09/18/2012 Elsevier Interactive Patient Education [employer].      I personally performed the services described in this documentation, which was scribed in my presence. The recorded information has been reviewed and considered, and addended by me as needed.

## 2016-03-02 NOTE — Patient Instructions (Addendum)
IF you received an x-ray today, you will receive an invoice from [facility]. Please contact [facility] at [phone] with questions or concerns regarding your invoice.   IF you received labwork today, you will receive an invoice from [employer]. Please contact [facility] at [phone] with questions or concerns regarding your invoice.   Our billing staff will not be able to assist you with questions regarding bills from these companies.  You will be contacted with the lab results as soon as they are available. The fastest way to get your results is to activate your My Chart account. Instructions are located on the last page of this paperwork. If you have not heard from [name] regarding the results in 2 weeks, please contact this office.    Continue fiber and diet approach to help with hemorrhoid prevention, but if persistent - let me know and I can refer you to other gastroenterologist.  Let me know if new referral is needed.  Keep up the good work with exercise and diet to help manage diabetes. Follow up in 6 months.    Hemorrhoids Hemorrhoids are swollen veins around the rectum or anus. There are two types of hemorrhoids:   Internal hemorrhoids. These occur in the veins just inside the rectum. They may poke through to the outside and become irritated and painful.  External hemorrhoids. These occur in the veins outside the anus and can be felt as a painful swelling or hard lump near the anus. CAUSES  Pregnancy.   Obesity.   Constipation or diarrhea.   Straining to have a bowel movement.   Sitting for long periods on the toilet.  Heavy lifting or other activity that caused you to strain.  Anal intercourse. SYMPTOMS   Pain.   Anal itching or irritation.   Rectal bleeding.   Fecal leakage.   Anal swelling.   One or more lumps around the anus.  DIAGNOSIS  Your caregiver may be able to diagnose hemorrhoids by  visual examination. Other examinations or tests that may be performed include:   Examination of the rectal area with a gloved hand (digital rectal exam).   Examination of anal canal using a small tube (scope).   A blood test if you have lost a significant amount of blood.  A test to look inside the colon (sigmoidoscopy or colonoscopy). TREATMENT Most hemorrhoids can be treated at home. However, if symptoms do not seem to be getting better or if you have a lot of rectal bleeding, your caregiver may perform a procedure to help make the hemorrhoids get smaller or remove them completely. Possible treatments include:   Placing a rubber band at the base of the hemorrhoid to cut off the circulation (rubber band ligation).   Injecting a chemical to shrink the hemorrhoid (sclerotherapy).   Using a tool to burn the hemorrhoid (infrared light therapy).   Surgically removing the hemorrhoid (hemorrhoidectomy).   Stapling the hemorrhoid to block blood flow to the tissue (hemorrhoid stapling).  HOME CARE INSTRUCTIONS   Eat foods with fiber, such as whole grains, beans, nuts, fruits, and vegetables. Ask your doctor about taking products with added fiber in them (fibersupplements).  Increase fluid intake. Drink enough water and fluids to keep your urine clear or pale yellow.   Exercise regularly.   Go to the bathroom when you have the urge to have a bowel movement. Do not wait.   Avoid straining to have bowel movements.   Keep the anal  area dry and clean. Use wet toilet paper or moist towelettes after a bowel movement.   Medicated creams and suppositories may be used or applied as directed.   Only take over-the-counter or prescription medicines as directed by your caregiver.   Take warm sitz baths for 15-20 minutes, 3-4 times a day to ease pain and discomfort.   Place ice packs on the hemorrhoids if they are tender and swollen. Using ice packs between sitz baths may be  helpful.   Put ice in a plastic bag.   Place a towel between your skin and the bag.   Leave the ice on for 15-20 minutes, 3-4 times a day.   Do not use a donut-shaped pillow or sit on the toilet for long periods. This increases blood pooling and pain.  SEEK MEDICAL CARE IF:  You have increasing pain and swelling that is not controlled by treatment or medicine.  You have uncontrolled bleeding.  You have difficulty or you are unable to have a bowel movement.  You have pain or inflammation outside the area of the hemorrhoids. MAKE SURE YOU:  Understand these instructions.  Will watch your condition.  Will get help right away if you are not doing well or get worse.   This information is not intended to replace advice given to you by your health care provider. Make sure you discuss any questions you have with your health care provider.   Document Released: 11/11/2000 Document Revised: 10/31/2012 Document Reviewed: 09/18/2012 Elsevier Interactive Patient Education [employer].

## 2016-03-07 ENCOUNTER — Encounter: Payer: Medicare Other | Admitting: Internal Medicine

## 2016-03-15 ENCOUNTER — Encounter: Payer: Self-pay | Admitting: Internal Medicine

## 2016-03-15 ENCOUNTER — Ambulatory Visit (INDEPENDENT_AMBULATORY_CARE_PROVIDER_SITE_OTHER): Payer: Medicare Other | Admitting: Internal Medicine

## 2016-03-15 VITALS — BP 132/85 | HR 96 | Ht 65.0 in | Wt 188.6 lb

## 2016-03-15 DIAGNOSIS — I472 Ventricular tachycardia, unspecified: Secondary | ICD-10-CM

## 2016-03-15 DIAGNOSIS — Z95810 Presence of automatic (implantable) cardiac defibrillator: Secondary | ICD-10-CM | POA: Diagnosis not present

## 2016-03-15 DIAGNOSIS — I495 Sick sinus syndrome: Secondary | ICD-10-CM | POA: Diagnosis not present

## 2016-03-15 LAB — CUP PACEART INCLINIC DEVICE CHECK
Brady Statistic AS VS Percent: 25.24 %
Brady Statistic RA Percent Paced: 74.74 %
Brady Statistic RV Percent Paced: 3.81 %
HIGH POWER IMPEDANCE MEASURED VALUE: 285 Ohm
HIGH POWER IMPEDANCE MEASURED VALUE: 38 Ohm
HighPow Impedance: 47 Ohm
Implantable Lead Implant Date: 20120531
Implantable Lead Location: 753859
Lead Channel Impedance Value: 399 Ohm
Lead Channel Impedance Value: 399 Ohm
Lead Channel Pacing Threshold Amplitude: 0.75 V
Lead Channel Pacing Threshold Pulse Width: 0.4 ms
Lead Channel Sensing Intrinsic Amplitude: 1.6 mV
MDC IDC LEAD IMPLANT DT: 20120531
MDC IDC LEAD LOCATION: 753860
MDC IDC MSMT BATTERY VOLTAGE: 2.73 V
MDC IDC MSMT LEADCHNL RV PACING THRESHOLD AMPLITUDE: 0.75 V
MDC IDC MSMT LEADCHNL RV PACING THRESHOLD PULSEWIDTH: 0.4 ms
MDC IDC MSMT LEADCHNL RV SENSING INTR AMPL: 11.125 mV
MDC IDC SESS DTM: 20170418161642
MDC IDC SET LEADCHNL RA PACING AMPLITUDE: 2 V
MDC IDC SET LEADCHNL RV PACING AMPLITUDE: 2.5 V
MDC IDC SET LEADCHNL RV PACING PULSEWIDTH: 0.4 ms
MDC IDC SET LEADCHNL RV SENSING SENSITIVITY: 0.3 mV
MDC IDC STAT BRADY AP VP PERCENT: 3.78 %
MDC IDC STAT BRADY AP VS PERCENT: 70.96 %
MDC IDC STAT BRADY AS VP PERCENT: 0.03 %

## 2016-03-15 LAB — TSH: TSH: 4.11 m[IU]/L (ref 0.40–4.50)

## 2016-03-15 NOTE — Patient Instructions (Addendum)
Medication Instructions: - Your physician recommends that you continue on your current medications as directed. Please refer to the Current Medication list given to you today.  Labwork: - Your physician recommends that you have lab work today: TSH  Procedures/Testing: - none  Follow-Up: - Remote monitoring is used to monitor your Pacemaker of ICD from home. This monitoring reduces the number of office visits required to check your device to one time per year. It allows [name] to keep an eye on the functioning of your device to ensure it is working properly. You are scheduled for a device check from home on 06/14/16. You may send your transmission at any time that day. If you have a wireless device, the transmission will be sent automatically. After your physician reviews your transmission, you will receive a postcard with your next transmission date.  - Your physician wants you to follow-up in: 6 months with Tommye Standard, PA for Dr. Caryl Comes.. You will receive a reminder letter in the mail two months in advance. If you don't receive a letter, please call our office to schedule the follow-up appointment.  Any Additional Special Instructions Will Be Listed Below (If Applicable).     If you need a refill on your cardiac medications before your next appointment, please call your pharmacy.

## 2016-03-15 NOTE — Progress Notes (Signed)
Patient Care Team: Barton Fanny, MD as PCP - General (Family Medicine) [name], MD (Cardiology)   HPI  Dakota Gutierrez is a 65 y.o. 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 Unfortunately, in the days that followed he developed VT storm with multiple shocks and was treated with amiodarone which has been gradually down titrated to 100 mg a day.  Amiodarone surveillance had demonstrated elevated liver function tests spring 2013 . We elected to discontinue the amiodarone albeit with some reluctance on the part of the family. When seen in June of recurrent episodes of ventricular tachycardia 2 of which required therapy. He was then resumed on amiodarone and referred for consultation with GI  most recently 4/17 transaminases were normal.    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  There have been no palpitations, lightheadedness or syncope.   No sx with VT episode    Myoview 2012-no ischemia ejection fraction 56%   Past Medical History  Diagnosis Date  . Syncope   . Ventricular tachycardia ([place])     storm following ICD implant on amiodarone   . HTN (hypertension)   . Dual implantable cardiac defibrillator in situ     Medtronic [id number] Protecta  . 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 Elevated transaminase level     10/2011  ? amio  . Other primary cardiomyopathies   . Diabetes mellitus without complication ([place])   . GERD (gastroesophageal reflux disease)   . Tubular adenoma of colon     Past Surgical History  Procedure Laterality Date  . Cardiac defibrillator placement      Medtronic D314DRG Protecta  . Tonsillectomy and adenoidectomy    . Gallbladder surgery    . Hernia  repair    . Cholecystectomy      Current Outpatient Prescriptions  Medication Sig Dispense Refill  . amiodarone (PACERONE) 200 MG tablet Take 0.5 tablets (100 mg total) by mouth daily. 45 tablet 3  . aspirin EC 81 MG tablet Take 1 tablet (81 mg total) by mouth daily.    . Cholecalciferol (VITAMIN D) 2000 UNITS tablet Take 2,000 Units by mouth daily.    . famotidine (PEPCID) 20 MG tablet Take 1 tablet (20 mg total) by mouth 2 (two) times daily as needed. (Patient taking differently: Take 20 mg by mouth 2 (two) times daily as needed for heartburn or indigestion. ) 60 tablet 4  . losartan-hydrochlorothiazide (HYZAAR) 100-12.5 MG tablet TAKE 1 TABLET BY MOUTH DAILY. 30 tablet 0  . metFORMIN (GLUCOPHAGE) 500 MG tablet TAKE 1 TABLET ONCE A DAY WITH LARGEST MEAL. 90 tablet 2  . metoprolol (LOPRESSOR) 50 MG tablet TAKE ONE TABLET BY MOUTH TWICE DAILY 180 tablet 0  . Omega-3 Fatty Acids (FISH OIL) 1000 MG CAPS Take 1 capsule by mouth 2 (two) times daily.     . tadalafil (CIALIS) 20 MG tablet Take 0.5-1 tablets (10-20 mg total) by mouth daily as needed for erectile dysfunction. 10 tablet 11  . valACYclovir (VALTREX) 1000 MG tablet Take 1 tablet (1,000 mg total) by mouth daily. 30 tablet 11  . zoster vaccine live, PF, (ZOSTAVAX) 60454 UNT/0.65ML injection Inject 19,400 Units into the skin once. 1 each 0  No current facility-administered medications for this visit.    Allergies  Allergen Reactions  . Adhesive [Tape]     Swelling and welts formed    Review of Systems negative except from HPI and PMH except for complaints of back pain and muscle pain  Physical Exam BP 132/85 mmHg  Pulse 96  Ht 5\' 5"  (1.651 m)  Wt 188 lb 9.6 oz (85.548 kg)  BMI 31.38 kg/m2 Well developed and well nourished in no acute distress HENT normal E scleral and icterus clear Neck Supple JVP flat; carotids brisk and full Clear to ausculation  Regular rate and rhythm, no murmurs gallops or rub Soft with active bowel  sounds No clubbing cyanosis none Edema Alert and oriented, grossly normal motor and sensory function Skin Warm and Dry  ECG is atrial pacing  At 88 17/11/36 1 Assessment and  Plan  VT Recurrent treated Ventricular tachycardia --ATPX2    ICD Medtronic  The patient's device was interrogated.  The information was reviewed. No changes were made in the programming.    Diabetes  Carotid stenosis  <39%   Sinus noded dysfunction   Amiodarone rx    Euvolemic continue current meds  Recurrent VT  Rx with ATP   conintinue current meds;  Will reasess LV function--years since last done  Check TSH for amio

## 2016-03-16 ENCOUNTER — Other Ambulatory Visit: Payer: Self-pay | Admitting: Family Medicine

## 2016-03-16 ENCOUNTER — Encounter: Payer: Self-pay | Admitting: *Deleted

## 2016-03-16 ENCOUNTER — Other Ambulatory Visit: Payer: Self-pay | Admitting: *Deleted

## 2016-03-16 MED ORDER — LOSARTAN POTASSIUM-HCTZ 100-12.5 MG PO TABS: 1.0000 | ORAL_TABLET | Freq: Every day | ORAL | Status: DC

## 2016-03-22 ENCOUNTER — Telehealth: Payer: Self-pay | Admitting: Internal Medicine

## 2016-03-22 DIAGNOSIS — I495 Sick sinus syndrome: Secondary | ICD-10-CM

## 2016-03-22 NOTE — Telephone Encounter (Signed)
I spoke with the patient. He stated Dr. Caryl Comes had mentioned possibly switching his beta blocker to a different one. He was inquiring about this as he is getting ready to re-order his metoprolol. Reviewed the patient's office visit with Dr. Caryl Comes. Per Dr. Caryl Comes- schedule an echo and then we can decide about his beta blocker. The patient is aware this will be ordered and that scheduling will be in touch with him to set this up. He is agreeable.

## 2016-03-22 NOTE — Telephone Encounter (Signed)
New Message: ° ° ° °Please call,question about his Metoprolol. °

## 2016-03-25 ENCOUNTER — Other Ambulatory Visit: Payer: Self-pay | Admitting: Internal Medicine

## 2016-04-07 ENCOUNTER — Other Ambulatory Visit: Payer: Self-pay

## 2016-04-07 ENCOUNTER — Ambulatory Visit (HOSPITAL_COMMUNITY): Payer: Medicare Other | Attending: Internal Medicine

## 2016-04-07 DIAGNOSIS — E669 Obesity, unspecified: Secondary | ICD-10-CM | POA: Insufficient documentation

## 2016-04-07 DIAGNOSIS — I495 Sick sinus syndrome: Secondary | ICD-10-CM | POA: Diagnosis not present

## 2016-04-07 DIAGNOSIS — E119 Type 2 diabetes mellitus without complications: Secondary | ICD-10-CM | POA: Insufficient documentation

## 2016-04-07 DIAGNOSIS — I517 Cardiomegaly: Secondary | ICD-10-CM | POA: Diagnosis not present

## 2016-04-07 DIAGNOSIS — I313 Pericardial effusion (noninflammatory): Secondary | ICD-10-CM | POA: Diagnosis not present

## 2016-04-07 DIAGNOSIS — I472 Ventricular tachycardia: Secondary | ICD-10-CM | POA: Insufficient documentation

## 2016-04-07 DIAGNOSIS — Z6831 Body mass index (BMI) 31.0-31.9, adult: Secondary | ICD-10-CM | POA: Diagnosis not present

## 2016-04-11 ENCOUNTER — Other Ambulatory Visit: Payer: Self-pay | Admitting: *Deleted

## 2016-04-11 MED ORDER — METOPROLOL SUCCINATE ER 50 MG PO TB24: 50.0000 mg | ORAL_TABLET | Freq: Every day | ORAL | Status: DC

## 2016-06-14 ENCOUNTER — Ambulatory Visit (INDEPENDENT_AMBULATORY_CARE_PROVIDER_SITE_OTHER): Payer: Medicare Other | Admitting: *Deleted

## 2016-06-14 DIAGNOSIS — I472 Ventricular tachycardia, unspecified: Secondary | ICD-10-CM

## 2016-06-14 DIAGNOSIS — I495 Sick sinus syndrome: Secondary | ICD-10-CM

## 2016-06-14 DIAGNOSIS — Z95810 Presence of automatic (implantable) cardiac defibrillator: Secondary | ICD-10-CM

## 2016-06-14 NOTE — Progress Notes (Signed)
Remote ICD transmission.   

## 2016-06-16 ENCOUNTER — Encounter: Payer: Self-pay | Admitting: Cardiology

## 2016-06-27 LAB — CUP PACEART REMOTE DEVICE CHECK
Brady Statistic AP VP Percent: 4.35 %
Brady Statistic AP VS Percent: 72.56 %
Brady Statistic AS VP Percent: 0.03 %
Brady Statistic RV Percent Paced: 4.37 %
Date Time Interrogation Session: 20170718073427
HIGH POWER IMPEDANCE MEASURED VALUE: 39 Ohm
HIGH POWER IMPEDANCE MEASURED VALUE: 46 Ohm
HighPow Impedance: 323 Ohm
Implantable Lead Implant Date: 20120531
Implantable Lead Location: 753859
Implantable Lead Location: 753860
Lead Channel Impedance Value: 399 Ohm
Lead Channel Pacing Threshold Amplitude: 0.5 V
Lead Channel Pacing Threshold Amplitude: 0.625 V
Lead Channel Pacing Threshold Pulse Width: 0.4 ms
Lead Channel Sensing Intrinsic Amplitude: 10.25 mV
Lead Channel Sensing Intrinsic Amplitude: 10.25 mV
Lead Channel Setting Pacing Amplitude: 2 V
Lead Channel Setting Pacing Amplitude: 2.5 V
Lead Channel Setting Pacing Pulse Width: 0.4 ms
Lead Channel Setting Sensing Sensitivity: 0.3 mV
MDC IDC LEAD IMPLANT DT: 20120531
MDC IDC MSMT BATTERY VOLTAGE: 2.66 V
MDC IDC MSMT LEADCHNL RA PACING THRESHOLD PULSEWIDTH: 0.4 ms
MDC IDC MSMT LEADCHNL RA SENSING INTR AMPL: 2.375 mV
MDC IDC MSMT LEADCHNL RA SENSING INTR AMPL: 2.375 mV
MDC IDC MSMT LEADCHNL RV IMPEDANCE VALUE: 399 Ohm
MDC IDC STAT BRADY AS VS PERCENT: 23.06 %
MDC IDC STAT BRADY RA PERCENT PACED: 76.91 %

## 2016-08-19 ENCOUNTER — Encounter: Payer: Self-pay | Admitting: Internal Medicine

## 2016-08-29 ENCOUNTER — Encounter: Payer: Medicare Other | Admitting: Physician Assistant

## 2016-09-11 NOTE — Progress Notes (Signed)
Cardiology Office Note Date:  09/12/2016  Patient ID:  Dakota Gutierrez, Dakota Gutierrez 05/05/1951, MRN [id number] PCP:  Dakota Agreste, MD  Cardiologist:  Dr. Caryl Comes   Chief Complaint: routine visit  History of Present Illness: Dakota Gutierrez is a 65 y.o. male with history of VT with ICD, HTN, DM, GERD, comes in today to be seen for Dr. Caryl Comes.  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 Unfortunately, in the days that followed he developed VT storm with multiple shocks and was treated with amiodarone which has been gradually down titrated to 100 mg a day.  Amiodarone surveillance had demonstrated elevated liver function tests spring 2013 . We elected to discontinue the amiodarone albeit with some reluctance on the part of the family. When seen in June of recurrent episodes of ventricular tachycardia 2 of which required therapy. He was then resumed on amiodarone and referred for consultation with GI  most recently 4/17 transaminases were normal".  Hs last echo noted mild decrease in LV function and his lopressor changed to Toprol.  He reports today feeling pretty well.  He denies any CP, states he gets a discomfort in his chest when he has over eaten, this is chronic and unchanged for years, no palpitations, no SOB.  He denies any dizziness, near syncope or syncope.   Device history: MDT dual chamber ICD, implanted 04/28/11, Dr. Caryl Comes, primary prevention + hx of appropriate therapy AAD: amiodarone   Past Medical History:  Diagnosis Date  . 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 Diabetes mellitus without complication (Dakota Gutierrez)   . Dual implantable cardiac defibrillator in situ    Medtronic D314DRG Protecta  . Elevated transaminase level    10/2011  ? amio  . GERD (gastroesophageal reflux disease)   . HTN (hypertension)   . Other primary cardiomyopathies (Dakota Gutierrez)     . Syncope   . Tubular adenoma of colon   . Ventricular tachycardia (Dakota Gutierrez)    storm following ICD implant on amiodarone     Past Surgical History:  Procedure Laterality Date  . CARDIAC DEFIBRILLATOR PLACEMENT     Medtronic D314DRG Protecta  . CHOLECYSTECTOMY    . GALLBLADDER SURGERY    . HERNIA REPAIR    . TONSILLECTOMY AND ADENOIDECTOMY      Current Outpatient Prescriptions  Medication Sig Dispense Refill  . amiodarone (PACERONE) 200 MG tablet TAKE ONE-HALF TABLET BY MOUTH ONCE DAILY. 45 tablet 3  . aspirin EC 81 MG tablet Take 1 tablet (81 mg total) by mouth daily.    . Cholecalciferol (VITAMIN D) 2000 UNITS tablet Take 2,000 Units by mouth daily.    . famotidine (PEPCID) 20 MG tablet Take 1 tablet (20 mg total) by mouth 2 (two) times daily as needed. (Patient taking differently: Take 20 mg by mouth 2 (two) times daily as needed for heartburn or indigestion. ) 60 tablet 4  . losartan-hydrochlorothiazide (HYZAAR) 100-12.5 MG tablet Take 1 tablet by mouth daily. 90 tablet 1  . metFORMIN (GLUCOPHAGE) 500 MG tablet TAKE 1 TABLET ONCE A DAY WITH LARGEST MEAL. 90 tablet 2  . metoprolol succinate (TOPROL-XL) 100 MG 24 hr tablet Take 1 tablet (100 mg total) by mouth daily. Take with or immediately following a meal. 90 tablet 1  . Omega-3 Fatty Acids (FISH OIL) 1000 MG CAPS Take 1 capsule by mouth 2 (two) times daily.     Marland Kitchen  tadalafil (CIALIS) 20 MG tablet Take 0.5-1 tablets (10-20 mg total) by mouth daily as needed for erectile dysfunction. 10 tablet 11  . valACYclovir (VALTREX) 1000 MG tablet Take 1 tablet (1,000 mg total) by mouth daily. 30 tablet 11  . zoster vaccine live, PF, (ZOSTAVAX) 16109 UNT/0.65ML injection Inject 19,400 Units into the skin once. 1 each 0   No current facility-administered medications for this visit.     Allergies:   Adhesive [tape]   Social History:  The patient  reports that he has never smoked. He has never used smokeless tobacco. He reports that he drinks  about 1.2 oz of alcohol per week . He reports that he does not use drugs.   Family History:  The patient's family history includes Heart disease in his father; Hyperlipidemia in his mother.  ROS:  Please see the history of present illness.  All other systems are reviewed and otherwise negative.   PHYSICAL EXAM:  VS:  BP 134/72   Pulse 98   Ht 5\' 5"  (1.651 m)   Wt 192 lb (87.1 kg)   BMI 31.95 kg/m  BMI: Body mass index is 31.95 kg/m. Well nourished, well developed, in no acute distress  HEENT: normocephalic, atraumatic  Neck: no JVD, carotid bruits or masses Cardiac: RRR; no significant murmurs, no rubs, or gallops Lungs:  clear to auscultation bilaterally, no wheezing, rhonchi or rales  Abd: soft, nontender MS: no deformity or atrophy Ext: no edema  Skin: warm and dry, no rash Neuro:  No gross deficits appreciated Psych: euthymic mood, full affect  ICD site is stable, no tethering or discomfort   EKG:  Done 03/15/16: A paced, V sensed ICD interrogation today: battery voltage is nearing ERI, 2.63V, lead status is stable, he had VT that was successfuly terminated with ATP on August2,  3 NSVT episodes Aug 15, none since  04/07/16: TTE Study Conclusions - Left ventricle: The cavity size was mildly dilated. Wall   thickness was normal. Systolic function was mildly reduced. The   estimated ejection fraction was in the range of 45% to 50%.   Diffuse hypokinesis. Doppler parameters are consistent with   abnormal left ventricular relaxation (grade 1 diastolic   dysfunction). The E/e&' ratio is between 8-15, suggesting   indeterminate LV filling pressure. - Left atrium: The atrium was mildly dilated. - Right ventricle: The cavity size was normal. Pacer wire or   catheter noted in right ventricle. Systolic function is reduced. - Right atrium: The atrium was normal in size. Pacer wire or   catheter noted in right atrium. - Inferior vena cava: The vessel was normal in size. The    respirophasic diameter changes were in the normal range (>= 50%),   consistent with normal central venous pressure. - Pericardium, extracardiac: A trivial pericardial effusion was   identified. Impressions: - LVEF 45-50%, diffuse hypokinesis, normal wall thickness with   dilated LV cavity, diastolic dysfunction, indeterminate LV   filling pressure, mildly dilated LA, RV/RA AICD wires noted,   reduced RV systolic function, trivial pericardial effusion.  Recent Labs: 03/02/2016: ALT 28; BUN 14; Creat 1.01; Potassium 4.2; Sodium 138 03/15/2016: TSH 4.11  03/02/2016: Cholesterol 121; HDL 23; LDL Cholesterol 37; Total CHOL/HDL Ratio 5.3; Triglycerides 303; VLDL 61   CrCl cannot be calculated (Patient's most recent lab result is older than the maximum 21 days allowed.).   Wt Readings from Last 3 Encounters:  09/12/16 192 lb (87.1 kg)  03/15/16 188 lb 9.6 oz (85.5 kg)  03/02/16 189 lb (85.7 kg)     Other studies reviewed: Additional studies/records reviewed today include: summarized above  ASSESSMENT AND PLAN:  1. VT, polymorphic w/ICD     normal device function     + VT, will up-titrate his Toprol to 100mg  daily, continue amiodarone, he will be seeing GI early next year     He is made aware of no driving for 6 months post VT w/therapy  2. Mild NICM    meds adjusted after above echo    on BB/ARB  3. HTN     stable    Disposition: F/u with 1 month remote check on his device given battery status, and 6 months with Dr. Caryl Comes.  Current medicines are reviewed at length with the patient today.  The patient did not have any concerns regarding medicines.  Haywood Lasso, PA-C 09/12/2016 4:10 PM     [address]  [place] [address] [phone] (office)  [phone] (fax)

## 2016-09-12 ENCOUNTER — Ambulatory Visit (INDEPENDENT_AMBULATORY_CARE_PROVIDER_SITE_OTHER): Payer: Medicare Other | Admitting: Physician Assistant

## 2016-09-12 ENCOUNTER — Encounter: Payer: Self-pay | Admitting: Physician Assistant

## 2016-09-12 ENCOUNTER — Other Ambulatory Visit: Payer: Self-pay | Admitting: Family Medicine

## 2016-09-12 VITALS — BP 134/72 | HR 98 | Ht 65.0 in | Wt 192.0 lb

## 2016-09-12 DIAGNOSIS — I472 Ventricular tachycardia, unspecified: Secondary | ICD-10-CM

## 2016-09-12 DIAGNOSIS — I10 Essential (primary) hypertension: Secondary | ICD-10-CM | POA: Diagnosis not present

## 2016-09-12 DIAGNOSIS — I420 Dilated cardiomyopathy: Secondary | ICD-10-CM | POA: Diagnosis not present

## 2016-09-12 MED ORDER — METOPROLOL SUCCINATE ER 100 MG PO TB24: 100.0000 mg | ORAL_TABLET | Freq: Every day | ORAL | 1 refills | Status: DC

## 2016-09-12 NOTE — Patient Instructions (Addendum)
Medication Instructions:   START TAKING METOPROLOL SUCCINATE TOPROL XL 100 MG ONCE A DAY     If you need a refill on your cardiac medications before your next appointment, please call your pharmacy.  Labwork:NONE ORDER TODAY    Testing/Procedures: NONE ORDER TODAY    Follow-Up:  Your physician wants you to follow-up in:  [place] will receive a reminder letter in the mail two months in advance. If you don't receive a letter, please call our office to schedule the follow-up appointment.   Remote monitoring is used to monitor your Pacemaker of ICD from home. This monitoring reduces the number of office visits required to check your device to one time per year. It allows [name] to keep an eye on the functioning of your device to ensure it is working properly. You are scheduled for a device check from home on . 10/13/16..You may send your transmission at any time that day. If you have a wireless device, the transmission will be sent automatically. After your physician reviews your transmission, you will receive a postcard with your next transmission date.     Any Other Special Instructions Will Be Listed Below (If Applicable).

## 2016-09-15 ENCOUNTER — Ambulatory Visit (INDEPENDENT_AMBULATORY_CARE_PROVIDER_SITE_OTHER): Payer: Medicare Other | Admitting: Family Medicine

## 2016-09-15 ENCOUNTER — Encounter: Payer: Self-pay | Admitting: Family Medicine

## 2016-09-15 VITALS — BP 120/70 | HR 103 | Temp 98.7°F | Resp 16 | Ht 64.75 in | Wt 191.2 lb

## 2016-09-15 DIAGNOSIS — E119 Type 2 diabetes mellitus without complications: Secondary | ICD-10-CM | POA: Diagnosis not present

## 2016-09-15 DIAGNOSIS — I10 Essential (primary) hypertension: Secondary | ICD-10-CM

## 2016-09-15 DIAGNOSIS — N529 Male erectile dysfunction, unspecified: Secondary | ICD-10-CM

## 2016-09-15 DIAGNOSIS — M7021 Olecranon bursitis, right elbow: Secondary | ICD-10-CM | POA: Diagnosis not present

## 2016-09-15 DIAGNOSIS — Z23 Encounter for immunization: Secondary | ICD-10-CM

## 2016-09-15 MED ORDER — METFORMIN HCL 500 MG PO TABS: ORAL_TABLET | ORAL | 2 refills | Status: DC

## 2016-09-15 MED ORDER — LOSARTAN POTASSIUM-HCTZ 100-12.5 MG PO TABS: 1.0000 | ORAL_TABLET | Freq: Every day | ORAL | 1 refills | Status: DC

## 2016-09-15 MED ORDER — METOPROLOL SUCCINATE ER 100 MG PO TB24: 100.0000 mg | ORAL_TABLET | Freq: Every day | ORAL | 1 refills | Status: DC

## 2016-09-15 MED ORDER — TADALAFIL 20 MG PO TABS: 10.0000 mg | ORAL_TABLET | Freq: Every day | ORAL | 11 refills | Status: DC | PRN

## 2016-09-15 NOTE — Progress Notes (Signed)
By signing my name below, I, [name], attest that this documentation has been prepared under the direction and in the presence of [name], MD.  Electronically Signed: [name], Medical Scribe. 09/15/16. 4:52 PM.  Subjective:    Patient ID: Dakota Gutierrez, male    DOB: 30-Nov-1950, 65 y.o.   MRN: [id number]  HPI Chief Complaint  Patient presents with  . right elbow    swollen, pt states he first noticed it on Monday, x 4 days    HPI Comments: Dakota Gutierrez is a 65 y.o. male who presents to the [facility] complaining of swollen right elbow for the past 4 days as well as medication follow-up. His last visit was in April of this year for DM. Pt had lunch around noon today.  Elbow edema: Pt noticed it when he felt something under his elbow after resting his elbow on the arm rest 4 days ago. Pt normally puts his elbow on the armrest when he drives. Pt states it hasn't grown in size and it's not painful. Pt denies hitting his elbow on anything.   DM: He's on Metformin 500 mg QD. 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 Pt states it caused him to have a blind spot in his vision and they suspected it was from cholesterol. No change in medications. Pt denies experiencing negative side effects on his medication. Lab Results  Component Value Date   HGBA1C 6.1 03/02/2016   Lab Results  Component Value Date   MICROALBUR <0.2 08/17/2015   Erectile Dysfunction: Cialis in the past without difficulty. Pt takes it once a month, but he normally takes it 4x a month.  Genital HSV: Valtrex for suppression.  HTN: Takes losartan HCTZ as well as toprol-xl. Pt's bp is higher after he walks but it normally runs around 120/70. Pt did his first 5k 5 days ago and he walked it in less than an hour. Pt occasionally gets HAs but isn't sure if  it's due to medications. Lab Results  Component Value Date   CREATININE 1.01 03/02/2016   Ventricular tachycardia: He has a ICD in place and treaded with amiodarone.  Patient Active Problem List   Diagnosis Date Noted  . Type II diabetes mellitus ([place]) 01/16/2015  . Hypersomnolence 08/14/2012  . History of Helicobacter pylori infection 04/16/2012  . GERD (gastroesophageal reflux disease) 04/16/2012  . Elevated transaminase level 02/02/2012  . Obesity 12/29/2011  . 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 Syncope   . Ventricular tachycardia ([place])   . Anxiety    Past Medical History:  Diagnosis Date  . 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 Diabetes mellitus without complication ([place])   . Dual implantable cardiac defibrillator in situ    Medtronic [id number] Protecta  . Elevated transaminase level    10/2011  ? amio  . GERD (gastroesophageal reflux disease)   . HTN (hypertension)   . Other primary cardiomyopathies   . Syncope   . Tubular adenoma of colon   . Ventricular tachycardia ([place])    storm following ICD implant on amiodarone    Past Surgical History:  Procedure Laterality Date  . CARDIAC DEFIBRILLATOR PLACEMENT     Medtronic [id number] Protecta  . CHOLECYSTECTOMY    . GALLBLADDER SURGERY    . HERNIA REPAIR    .  TONSILLECTOMY AND ADENOIDECTOMY     Allergies  Allergen Reactions  . Adhesive [Tape]     Swelling and welts formed   Prior to Admission medications   Medication Sig Start Date End Date Taking? Authorizing Provider  amiodarone (PACERONE) 200 MG tablet TAKE ONE-HALF TABLET BY MOUTH ONCE DAILY. 03/25/16  Yes [name], MD  aspirin EC 81 MG tablet Take 1 tablet (81 mg total) by mouth daily. 11/11/11  Yes [name], MD  Cholecalciferol (VITAMIN D) 2000 UNITS tablet Take 2,000 Units by mouth daily.   Yes Historical Provider, MD  famotidine (PEPCID) 20 MG tablet Take 1 tablet (20 mg total) by mouth 2 (two) times daily as  needed. Patient taking differently: Take 20 mg by mouth 2 (two) times daily as needed for heartburn or indigestion.  04/16/12  Yes Jerene Bears, MD  losartan-hydrochlorothiazide (HYZAAR) 100-12.5 MG tablet TAKE 1 TABLET BY MOUTH DAILY. 09/13/16  Yes Wendie Agreste, MD  metFORMIN (GLUCOPHAGE) 500 MG tablet TAKE 1 TABLET ONCE A DAY WITH LARGEST MEAL. 03/02/16  Yes Wendie Agreste, MD  metoprolol succinate (TOPROL-XL) 100 MG 24 hr tablet Take 1 tablet (100 mg total) by mouth daily. Take with or immediately following a meal. 09/12/16  Yes Renee Dyane Dustman, PA-C  Omega-3 Fatty Acids (FISH OIL) 1000 MG CAPS Take 1 capsule by mouth 2 (two) times daily.    Yes Historical Provider, MD  tadalafil (CIALIS) 20 MG tablet Take 0.5-1 tablets (10-20 mg total) by mouth daily as needed for erectile dysfunction. 03/02/16  Yes Wendie Agreste, MD  valACYclovir (VALTREX) 1000 MG tablet Take 1 tablet (1,000 mg total) by mouth daily. 03/02/16  Yes Wendie Agreste, MD  zoster vaccine live, PF, (ZOSTAVAX) 57846 UNT/0.65ML injection Inject 19,400 Units into the skin once. 08/17/15   Wendie Agreste, MD   Social History   Social History  . Marital status: Unknown    Spouse name: N/A  . Number of children: N/A  . Years of education: N/A   Occupational History  . Retired Pharmacist, hospital    Social History Main Topics  . Smoking status: Never Smoker  . Smokeless tobacco: Never Used  . Alcohol use 1.2 oz/week    2 Cans of beer per week     Comment: 4/week  . Drug use: No  . Sexual activity: Yes   Other Topics Concern  . Not on file   Social History Narrative   Divorced. Education: [employer]. Exercise: walking 3 times a week for 30 minutes.   Retired Pharmacist, hospital   Review of Systems  Constitutional: Negative for fatigue and unexpected weight change.  Eyes: Negative for visual disturbance.  Respiratory: Negative for cough, chest tightness and shortness of breath.   Cardiovascular: Negative for chest pain, palpitations and leg  swelling.  Gastrointestinal: Negative for abdominal pain and blood in stool.  Musculoskeletal: Positive for joint swelling (elbow). Negative for arthralgias.  Neurological: Positive for headaches. Negative for dizziness and light-headedness.    Objective:  Physical Exam  Constitutional: He is oriented to person, place, and time. He appears well-developed and well-nourished.  HENT:  Head: Normocephalic and atraumatic.  Eyes: EOM are normal. Pupils are equal, round, and reactive to light.  Neck: No JVD present. Carotid bruit is not present.  Cardiovascular: Normal rate and regular rhythm.  Exam reveals no friction rub.   No murmur heard. Pulmonary/Chest: Effort normal and breath sounds normal. No respiratory distress. He has no wheezes. He has no  rales.  Musculoskeletal: He exhibits no edema (lower extremity).  Rt elbow: Mobile fluid collection at the olecranon bursa Non tender No erythema No warmth No discharge FROM of right elbow  Neurological: He is alert and oriented to person, place, and time.  Skin: Skin is warm and dry.  Psychiatric: He has a normal mood and affect.  Vitals reviewed.  BP 120/70   Pulse (!) 103   Temp 98.7 F (37.1 C) (Oral)   Resp 16   Ht 5' 4.75" (1.645 m)   Wt 191 lb 3.2 oz (86.7 kg)   SpO2 95%   BMI 32.06 kg/m    Assessment & Plan:    Dakota Gutierrez is a 65 y.o. male Controlled type 2 diabetes mellitus without complication, without long-term current use of insulin (HCC) - Plan: Hemoglobin A1C, Microalbumin, urine, metFORMIN (GLUCOPHAGE) 500 MG tablet  - chewk A1c, urine microalbumin. No change in meds for now as tolerating without new side effects.   Needs flu shot - Plan: Flu Vaccine QUAD 36+ mos IM  Erectile dysfunction, unspecified erectile dysfunction type - Plan: tadalafil (CIALIS) 20 MG tablet  - Cialis Rx given - use lowest effective dose. Tolerating without difficulty. Potential Side effects discussed, understanding  expressed.  Essential hypertension - Plan: Basic metabolic panel, losartan-hydrochlorothiazide (HYZAAR) 100-12.5 MG tablet, metoprolol succinate (TOPROL-XL) 100 MG 24 hr tablet  - stable, no new side effects with med. Labs pending (not fasting - check BMP, then lipids at future visit). Refill meds.   Olecranon bursitis of right elbow  - mild, no signs of infection. Compressive bandage/ACE if needed, and rtc if increased or persistent swelling for aspiration. rtc precautions.   Meds ordered this encounter  Medications  . losartan-hydrochlorothiazide (HYZAAR) 100-12.5 MG tablet    Sig: Take 1 tablet by mouth daily.    Dispense:  90 tablet    Refill:  1  . metoprolol succinate (TOPROL-XL) 100 MG 24 hr tablet    Sig: Take 1 tablet (100 mg total) by mouth daily. Take with or immediately following a meal.    Dispense:  90 tablet    Refill:  1  . tadalafil (CIALIS) 20 MG tablet    Sig: Take 0.5-1 tablets (10-20 mg total) by mouth daily as needed for erectile dysfunction.    Dispense:  10 tablet    Refill:  11    Can place on hold if not needed currently.  . metFORMIN (GLUCOPHAGE) 500 MG tablet    Sig: TAKE 1 TABLET ONCE A DAY WITH LARGEST MEAL.    Dispense:  90 tablet    Refill:  2   Patient Instructions    Your elbow swelling appears to be olecranon bursitis, but it is very mild at this time and does not appear infected. You can try a compressive dressing such as Ace bandage or elbow sleeve, avoid overuse or direct contact in that elbow, and if not improving in the next week, or any worsening sooner, I can draw the fluid out of that elbow here or you can seek care by another provider. Return to the clinic or go to the nearest emergency room if any of your symptoms worsen or new symptoms occur.  No change in other medications for now. [place]   Elbow Bursitis Elbow bursitis is inflammation of the fluid-filled sac (bursa) between the tip of your elbow  bone (olecranon) and your skin. Elbow bursitis may also be called olecranon bursitis.  Normally, the olecranon bursa has only a small amount of fluid in it to cushion and protect your elbow bone. Elbow bursitis causes fluid to build up inside the bursa. Over time, this swelling and inflammation can cause pain when you bend or lean on your elbow.  CAUSES Elbow bursitis may be caused by:   Elbow injury (acute trauma).  Leaning on hard surfaces for long periods of time.  Infection from an injury that breaks the skin near your elbow.  A bone growth (spur) that forms at the tip of your elbow.  A medical condition that causes inflammation in your body, such as gout or rheumatoid arthritis.  The cause may also be unknown.  SIGNS AND SYMPTOMS  The first sign of elbow bursitis is usually swelling over the tip of your elbow. This can grow to be the size of a golf ball. This may start suddenly or develop gradually. You may also have:  Pain when bending or leaning on your elbow.  Restricted movement of your elbow.  If your bursitis is caused by an infection, symptoms may also include:  Redness, warmth, and tenderness of the elbow.  Drainage of pus from the swollen area over your elbow, if the skin breaks open. DIAGNOSIS  Your health care provider may be able to diagnose elbow bursitis based on your signs and symptoms, especially if you have recently been injured. Your health care provider will also do a physical exam. This may include:  X-rays to look for a bone spur or a bone fracture.  Draining fluid from the bursa to test it for infection.  Blood tests to rule out gout or rheumatoid arthritis. TREATMENT  Treatment for elbow bursitis depends on the cause. Treatment may include:  Medicines. These may include:  Over-the-counter medicines to relieve pain and inflammation.  Antibiotic medicines to fight infection.  Injections of anti-inflammatory medicines (steroids).  Wrapping your  elbow with a bandage.  Draining fluid from the bursa.  Wearing elbow pads.  If your bursitis does not get better with treatment, surgery may be needed to remove the bursa.  HOME CARE INSTRUCTIONS   Take medicines only as directed by your health care provider.  If you were prescribed an antibiotic medicine, finish all of it even if you start to feel better.  If your bursitis is caused by an injury, rest your elbow and wear your bandage as directed by your health care provider. You may alsoapply ice to the injured area as directed by your health care provider:  Put ice in a plastic bag.  Place a towel between your skin and the bag.  Leave the ice on for 20 minutes, 2-3 times per day.  Avoid any activities that cause elbow pain.  Use elbow pads or elbow wraps to cushion your elbow. SEEK MEDICAL CARE IF:  You have a fever.   Your symptoms do not get better with treatment.  Your pain or swelling gets worse.  Your elbow pain or swelling goes away and then returns.  You have drainage of pus from the swollen area over your elbow.   This information is not intended to replace advice given to you by your health care provider. Make sure you discuss any questions you have with your health care provider.   Document Released: 12/14/2006 Document Revised: 12/05/2014 Document Reviewed: 07/23/2014 Elsevier Interactive Patient Education 2016 [employer].    IF you received an x-ray today, you will receive an invoice from [facility]. Please contact [facility]  at [phone] with questions or concerns regarding your invoice.   IF you received labwork today, you will receive an invoice from [employer]. Please contact [facility] at [phone] with questions or concerns regarding your invoice.   Our billing staff will not be able to assist you with questions regarding bills from these companies.  You will be contacted with the lab results  as soon as they are available. The fastest way to get your results is to activate your My Chart account. Instructions are located on the last page of this paperwork. If you have not heard from [name] regarding the results in 2 weeks, please contact this office.    Influenza (Flu) Vaccine (Inactivated or Recombinant):  1. Why get vaccinated? Influenza ("flu") is a contagious disease that spreads around the [place] every year, usually between October and May. Flu is caused by influenza viruses, and is spread mainly by coughing, sneezing, and close contact. Anyone can get flu. Flu strikes suddenly and can last several days. Symptoms vary by age, but can include:  fever/chills  sore throat  muscle aches  fatigue  cough  headache  runny or stuffy nose Flu can also lead to pneumonia and blood infections, and cause diarrhea and seizures in children. If you have a medical condition, such as heart or lung disease, flu can make it worse. Flu is more dangerous for some people. Infants and young children, people 87 years of age and older, pregnant women, and people with certain health conditions or a weakened immune system are at greatest risk. Each year thousands of people in the [place] die from flu, and many more are hospitalized. Flu vaccine can:  keep you from getting flu,  make flu less severe if you do get it, and  keep you from spreading flu to your family and other people. 2. Inactivated and recombinant flu vaccines A dose of flu vaccine is recommended every flu season. Children 6 months through 81 years of age may need two doses during the same flu season. Everyone else needs only one dose each flu season. Some inactivated flu vaccines contain a very small amount of a mercury-based preservative called thimerosal. Studies have not shown thimerosal in vaccines to be harmful, but flu vaccines that do not contain thimerosal are available. There is no live flu virus in flu shots.  They cannot cause the flu. There are many flu viruses, and they are always changing. Each year a new flu vaccine is made to protect against three or four viruses that are likely to cause disease in the upcoming flu season. But even when the vaccine doesn't exactly match these viruses, it may still provide some protection. Flu vaccine cannot prevent:  flu that is caused by a virus not covered by the vaccine, or  illnesses that look like flu but are not. It takes about 2 weeks for protection to develop after vaccination, and protection lasts through the flu season. 3. Some people should not get this vaccine Tell the person who is giving you the vaccine:  If you have any severe, life-threatening allergies. If you ever had a life-threatening allergic reaction after a dose of flu vaccine, or have a severe allergy to any part of this vaccine, you may be advised not to get vaccinated. Most, but not all, types of flu vaccine contain a small amount of egg protein.  If you ever had Guillain-Barre Syndrome (also called GBS). Some people with a history of GBS should not get  this vaccine. This should be discussed with your doctor.  If you are not feeling well. It is usually okay to get flu vaccine when you have a mild illness, but you might be asked to come back when you feel better. 4. Risks of a vaccine reaction With any medicine, including vaccines, there is a chance of reactions. These are usually mild and go away on their own, but serious reactions are also possible. Most people who get a flu shot do not have any problems with it. Minor problems following a flu shot include:  soreness, redness, or swelling where the shot was given  hoarseness  sore, red or itchy eyes  cough  fever  aches  headache  itching  fatigue If these problems occur, they usually begin soon after the shot and last 1 or 2 days. More serious problems following a flu shot can include the following:  There may be a  small increased risk of Guillain-Barre Syndrome (GBS) after inactivated flu vaccine. This risk has been estimated at 1 or 2 additional cases per million people vaccinated. This is much lower than the risk of severe complications from flu, which can be prevented by flu vaccine.  Young children who get the flu shot along with pneumococcal vaccine (PCV13) and/or DTaP vaccine at the same time might be slightly more likely to have a seizure caused by fever. Ask your doctor for more information. Tell your doctor if a child who is getting flu vaccine has ever had a seizure. Problems that could happen after any injected vaccine:  People sometimes faint after a medical procedure, including vaccination. Sitting or lying down for about 15 minutes can help prevent fainting, and injuries caused by a fall. Tell your doctor if you feel dizzy, or have vision changes or ringing in the ears.  Some people get severe pain in the shoulder and have difficulty moving the arm where a shot was given. This happens very rarely.  Any medication can cause a severe allergic reaction. Such reactions from a vaccine are very rare, estimated at about 1 in a million doses, and would happen within a few minutes to a few hours after the vaccination. As with any medicine, there is a very remote chance of a vaccine causing a serious injury or death. The safety of vaccines is always being monitored. For more information, visit: http://www.aguilar.org/ 5. What if there is a serious reaction? What should I look for?  Look for anything that concerns you, such as signs of a severe allergic reaction, very high fever, or unusual behavior. Signs of a severe allergic reaction can include hives, swelling of the face and throat, difficulty breathing, a fast heartbeat, dizziness, and weakness. These would start a few minutes to a few hours after the vaccination. What should I do?  If you think it is a severe allergic reaction or other emergency  that can't wait, call 9-1-1 and get the person to the nearest hospital. Otherwise, call your doctor.  Reactions should be reported to the Vaccine Adverse Event Reporting System (VAERS). Your doctor should file this report, or you can do it yourself through the VAERS web site at www.vaers.SamedayNews.es, or by calling 712-030-2277. VAERS does not give medical advice. 6. The National Vaccine Injury Compensation Program The [employer] Vaccine Injury Compensation Program (VICP) is a federal program that was created to compensate people who may have been injured by certain vaccines. Persons who believe they may have been injured by a vaccine can learn about the  program and about filing a claim by calling (971)605-2603 or visiting the [place] website at GoldCloset.com.ee. There is a time limit to file a claim for compensation. 7. How can I learn more?  Ask your healthcare provider. He or she can give you the vaccine package insert or suggest other sources of information.  Call your local or state health department.  Contact the Centers for Disease Control and Prevention (CDC):  Call (267)571-2432 (1-800-CDC-INFO) or  Visit CDC's website at https://gibson.com/ Vaccine Information Statement Inactivated Influenza Vaccine (07/04/2014)   This information is not intended to replace advice given to you by your health care provider. Make sure you discuss any questions you have with your health care provider.   Document Released: 09/08/2006 Document Revised: 12/05/2014 Document Reviewed: 07/07/2014 Elsevier Interactive Patient Education [employer].    I personally performed the services described in this documentation, which was scribed in my presence. The recorded information has been reviewed and considered, and addended by me as needed.   Signed,   [name], MD [facility].  09/16/16 11:04 PM

## 2016-09-15 NOTE — Patient Instructions (Addendum)
Your elbow swelling appears to be olecranon bursitis, but it is very mild at this time and does not appear infected. You can try a compressive dressing such as Ace bandage or elbow sleeve, avoid overuse or direct contact in that elbow, and if not improving in the next week, or any worsening sooner, I can draw the fluid out of that elbow here or you can seek care by another provider. Return to the clinic or go to the nearest emergency room if any of your symptoms worsen or new symptoms occur.  No change in other medications for now. [place]   Elbow Bursitis Elbow bursitis is inflammation of the fluid-filled sac (bursa) between the tip of your elbow bone (olecranon) and your skin. Elbow bursitis may also be called olecranon bursitis. Normally, the olecranon bursa has only a small amount of fluid in it to cushion and protect your elbow bone. Elbow bursitis causes fluid to build up inside the bursa. Over time, this swelling and inflammation can cause pain when you bend or lean on your elbow.  CAUSES Elbow bursitis may be caused by:   Elbow injury (acute trauma).  Leaning on hard surfaces for long periods of time.  Infection from an injury that breaks the skin near your elbow.  A bone growth (spur) that forms at the tip of your elbow.  A medical condition that causes inflammation in your body, such as gout or rheumatoid arthritis.  The cause may also be unknown.  SIGNS AND SYMPTOMS  The first sign of elbow bursitis is usually swelling over the tip of your elbow. This can grow to be the size of a golf ball. This may start suddenly or develop gradually. You may also have:  Pain when bending or leaning on your elbow.  Restricted movement of your elbow.  If your bursitis is caused by an infection, symptoms may also include:  Redness, warmth, and tenderness of the elbow.  Drainage of pus from the swollen area over your elbow, if the skin breaks  open. DIAGNOSIS  Your health care provider may be able to diagnose elbow bursitis based on your signs and symptoms, especially if you have recently been injured. Your health care provider will also do a physical exam. This may include:  X-rays to look for a bone spur or a bone fracture.  Draining fluid from the bursa to test it for infection.  Blood tests to rule out gout or rheumatoid arthritis. TREATMENT  Treatment for elbow bursitis depends on the cause. Treatment may include:  Medicines. These may include:  Over-the-counter medicines to relieve pain and inflammation.  Antibiotic medicines to fight infection.  Injections of anti-inflammatory medicines (steroids).  Wrapping your elbow with a bandage.  Draining fluid from the bursa.  Wearing elbow pads.  If your bursitis does not get better with treatment, surgery may be needed to remove the bursa.  HOME CARE INSTRUCTIONS   Take medicines only as directed by your health care provider.  If you were prescribed an antibiotic medicine, finish all of it even if you start to feel better.  If your bursitis is caused by an injury, rest your elbow and wear your bandage as directed by your health care provider. You may alsoapply ice to the injured area as directed by your health care provider:  Put ice in a plastic bag.  Place a towel between your skin and the bag.  Leave the ice on for 20 minutes, 2-3  times per day.  Avoid any activities that cause elbow pain.  Use elbow pads or elbow wraps to cushion your elbow. SEEK MEDICAL CARE IF:  You have a fever.   Your symptoms do not get better with treatment.  Your pain or swelling gets worse.  Your elbow pain or swelling goes away and then returns.  You have drainage of pus from the swollen area over your elbow.   This information is not intended to replace advice given to you by your health care provider. Make sure you discuss any questions you have with your health care  provider.   Document Released: 12/14/2006 Document Revised: 12/05/2014 Document Reviewed: 07/23/2014 Elsevier Interactive Patient Education 2016 [employer].    IF you received an x-ray today, you will receive an invoice from [facility]. Please contact [facility] at [phone] with questions or concerns regarding your invoice.   IF you received labwork today, you will receive an invoice from [employer]. Please contact [facility] at [phone] with questions or concerns regarding your invoice.   Our billing staff will not be able to assist you with questions regarding bills from these companies.  You will be contacted with the lab results as soon as they are available. The fastest way to get your results is to activate your My Chart account. Instructions are located on the last page of this paperwork. If you have not heard from [name] regarding the results in 2 weeks, please contact this office.    Influenza (Flu) Vaccine (Inactivated or Recombinant):  1. Why get vaccinated? Influenza ("flu") is a contagious disease that spreads around the [place] every year, usually between October and May. Flu is caused by influenza viruses, and is spread mainly by coughing, sneezing, and close contact. Anyone can get flu. Flu strikes suddenly and can last several days. Symptoms vary by age, but can include:  fever/chills  sore throat  muscle aches  fatigue  cough  headache  runny or stuffy nose Flu can also lead to pneumonia and blood infections, and cause diarrhea and seizures in children. If you have a medical condition, such as heart or lung disease, flu can make it worse. Flu is more dangerous for some people. Infants and young children, people 31 years of age and older, pregnant women, and people with certain health conditions or a weakened immune system are at greatest risk. Each year thousands of people in the [place] die from  flu, and many more are hospitalized. Flu vaccine can:  keep you from getting flu,  make flu less severe if you do get it, and  keep you from spreading flu to your family and other people. 2. Inactivated and recombinant flu vaccines A dose of flu vaccine is recommended every flu season. Children 6 months through 57 years of age may need two doses during the same flu season. Everyone else needs only one dose each flu season. Some inactivated flu vaccines contain a very small amount of a mercury-based preservative called thimerosal. Studies have not shown thimerosal in vaccines to be harmful, but flu vaccines that do not contain thimerosal are available. There is no live flu virus in flu shots. They cannot cause the flu. There are many flu viruses, and they are always changing. Each year a new flu vaccine is made to protect against three or four viruses that are likely to cause disease in the upcoming flu season. But even when the vaccine doesn't exactly match these viruses, it may  still provide some protection. Flu vaccine cannot prevent:  flu that is caused by a virus not covered by the vaccine, or  illnesses that look like flu but are not. It takes about 2 weeks for protection to develop after vaccination, and protection lasts through the flu season. 3. Some people should not get this vaccine Tell the person who is giving you the vaccine:  If you have any severe, life-threatening allergies. If you ever had a life-threatening allergic reaction after a dose of flu vaccine, or have a severe allergy to any part of this vaccine, you may be advised not to get vaccinated. Most, but not all, types of flu vaccine contain a small amount of egg protein.  If you ever had Guillain-Barre Syndrome (also called GBS). Some people with a history of GBS should not get this vaccine. This should be discussed with your doctor.  If you are not feeling well. It is usually okay to get flu vaccine when you have a  mild illness, but you might be asked to come back when you feel better. 4. Risks of a vaccine reaction With any medicine, including vaccines, there is a chance of reactions. These are usually mild and go away on their own, but serious reactions are also possible. Most people who get a flu shot do not have any problems with it. Minor problems following a flu shot include:  soreness, redness, or swelling where the shot was given  hoarseness  sore, red or itchy eyes  cough  fever  aches  headache  itching  fatigue If these problems occur, they usually begin soon after the shot and last 1 or 2 days. More serious problems following a flu shot can include the following:  There may be a small increased risk of Guillain-Barre Syndrome (GBS) after inactivated flu vaccine. This risk has been estimated at 1 or 2 additional cases per million people vaccinated. This is much lower than the risk of severe complications from flu, which can be prevented by flu vaccine.  Young children who get the flu shot along with pneumococcal vaccine (PCV13) and/or DTaP vaccine at the same time might be slightly more likely to have a seizure caused by fever. Ask your doctor for more information. Tell your doctor if a child who is getting flu vaccine has ever had a seizure. Problems that could happen after any injected vaccine:  People sometimes faint after a medical procedure, including vaccination. Sitting or lying down for about 15 minutes can help prevent fainting, and injuries caused by a fall. Tell your doctor if you feel dizzy, or have vision changes or ringing in the ears.  Some people get severe pain in the shoulder and have difficulty moving the arm where a shot was given. This happens very rarely.  Any medication can cause a severe allergic reaction. Such reactions from a vaccine are very rare, estimated at about 1 in a million doses, and would happen within a few minutes to a few hours after the  vaccination. As with any medicine, there is a very remote chance of a vaccine causing a serious injury or death. The safety of vaccines is always being monitored. For more information, visit: http://www.aguilar.org/ 5. What if there is a serious reaction? What should I look for?  Look for anything that concerns you, such as signs of a severe allergic reaction, very high fever, or unusual behavior. Signs of a severe allergic reaction can include hives, swelling of the face and throat, difficulty breathing,  a fast heartbeat, dizziness, and weakness. These would start a few minutes to a few hours after the vaccination. What should I do?  If you think it is a severe allergic reaction or other emergency that can't wait, call 9-1-1 and get the person to the nearest hospital. Otherwise, call your doctor.  Reactions should be reported to the Vaccine Adverse Event Reporting System (VAERS). Your doctor should file this report, or you can do it yourself through the VAERS web site at www.vaers.SamedayNews.es, or by calling 916-831-9629. VAERS does not give medical advice. 6. The National Vaccine Injury Compensation Program The [employer] Vaccine Injury Compensation Program (VICP) is a federal program that was created to compensate people who may have been injured by certain vaccines. Persons who believe they may have been injured by a vaccine can learn about the program and about filing a claim by calling 704-789-0359 or visiting the [employer] website at GoldCloset.com.ee. There is a time limit to file a claim for compensation. 7. How can I learn more?  Ask your healthcare provider. He or she can give you the vaccine package insert or suggest other sources of information.  Call your local or state health department.  Contact the Centers for Disease Control and Prevention (CDC):  Call 819-450-2049 (1-800-CDC-INFO) or  Visit CDC's website at https://gibson.com/ Vaccine Information Statement  Inactivated Influenza Vaccine (07/04/2014)   This information is not intended to replace advice given to you by your health care provider. Make sure you discuss any questions you have with your health care provider.   Document Released: 09/08/2006 Document Revised: 12/05/2014 Document Reviewed: 07/07/2014 Elsevier Interactive Patient Education [employer].

## 2016-09-16 LAB — BASIC METABOLIC PANEL
BUN: 19 mg/dL (ref 7–25)
CALCIUM: 9.9 mg/dL (ref 8.6–10.3)
CO2: 26 mmol/L (ref 20–31)
Chloride: 101 mmol/L (ref 98–110)
Creat: 1.19 mg/dL (ref 0.70–1.25)
GLUCOSE: 113 mg/dL — AB (ref 65–99)
Potassium: 4.3 mmol/L (ref 3.5–5.3)
Sodium: 139 mmol/L (ref 135–146)

## 2016-09-16 LAB — HEMOGLOBIN A1C
HEMOGLOBIN A1C: 6.1 % — AB (ref <5.7)
MEAN PLASMA GLUCOSE: 128 mg/dL

## 2016-09-16 LAB — MICROALBUMIN, URINE: Microalb, Ur: 0.7 mg/dL

## 2016-10-13 ENCOUNTER — Ambulatory Visit (INDEPENDENT_AMBULATORY_CARE_PROVIDER_SITE_OTHER): Payer: Medicare Other | Admitting: *Deleted

## 2016-10-13 DIAGNOSIS — I420 Dilated cardiomyopathy: Secondary | ICD-10-CM

## 2016-10-13 NOTE — Progress Notes (Signed)
Remote ICD transmission.   

## 2016-10-19 ENCOUNTER — Encounter: Payer: Self-pay | Admitting: Cardiology

## 2016-10-22 LAB — CUP PACEART REMOTE DEVICE CHECK
Brady Statistic AP VP Percent: 2.01 %
Brady Statistic RA Percent Paced: 67.49 %
Brady Statistic RV Percent Paced: 2.04 %
HIGH POWER IMPEDANCE MEASURED VALUE: 342 Ohm
HIGH POWER IMPEDANCE MEASURED VALUE: 41 Ohm
HighPow Impedance: 50 Ohm
Implantable Lead Implant Date: 20120531
Implantable Lead Location: 753860
Implantable Lead Model: 4076
Implantable Lead Model: 6947
Lead Channel Impedance Value: 437 Ohm
Lead Channel Pacing Threshold Amplitude: 0.5 V
Lead Channel Pacing Threshold Amplitude: 0.75 V
Lead Channel Pacing Threshold Pulse Width: 0.4 ms
Lead Channel Sensing Intrinsic Amplitude: 13 mV
Lead Channel Sensing Intrinsic Amplitude: 13 mV
Lead Channel Sensing Intrinsic Amplitude: 2.125 mV
Lead Channel Setting Pacing Amplitude: 2 V
Lead Channel Setting Pacing Amplitude: 2.5 V
Lead Channel Setting Pacing Pulse Width: 0.4 ms
Lead Channel Setting Sensing Sensitivity: 0.3 mV
MDC IDC LEAD IMPLANT DT: 20120531
MDC IDC LEAD LOCATION: 753859
MDC IDC MSMT BATTERY VOLTAGE: 2.63 V
MDC IDC MSMT LEADCHNL RA IMPEDANCE VALUE: 399 Ohm
MDC IDC MSMT LEADCHNL RA PACING THRESHOLD PULSEWIDTH: 0.4 ms
MDC IDC MSMT LEADCHNL RA SENSING INTR AMPL: 2.125 mV
MDC IDC PG IMPLANT DT: 20120531
MDC IDC SESS DTM: 20171116051607
MDC IDC STAT BRADY AP VS PERCENT: 65.48 %
MDC IDC STAT BRADY AS VP PERCENT: 0.03 %
MDC IDC STAT BRADY AS VS PERCENT: 32.48 %

## 2016-12-22 ENCOUNTER — Ambulatory Visit (INDEPENDENT_AMBULATORY_CARE_PROVIDER_SITE_OTHER): Payer: Medicare Other | Admitting: Family Medicine

## 2016-12-22 ENCOUNTER — Encounter: Payer: Self-pay | Admitting: Family Medicine

## 2016-12-22 VITALS — BP 138/85 | HR 109 | Temp 98.1°F | Ht 64.75 in | Wt 192.6 lb

## 2016-12-22 DIAGNOSIS — Z23 Encounter for immunization: Secondary | ICD-10-CM | POA: Diagnosis not present

## 2016-12-22 DIAGNOSIS — I10 Essential (primary) hypertension: Secondary | ICD-10-CM

## 2016-12-22 DIAGNOSIS — E119 Type 2 diabetes mellitus without complications: Secondary | ICD-10-CM

## 2016-12-22 DIAGNOSIS — E781 Pure hyperglyceridemia: Secondary | ICD-10-CM

## 2016-12-22 NOTE — Patient Instructions (Addendum)
  Depending on the triglycerides, you may need to start a medication to treat cholesterol. Will wait on the blood work to return to make that decision.  No change in medications for now, but if you want to try to come off of metformin for 4-5 days to see if that decreases diarrhea, that is fine. If diarrhea persists, return to discuss further.  Pneumonia vaccine given today. Recheck in 3 months.   IF you received an x-ray today, you will receive an invoice from [facility]. Please contact [facility]iology at [phone] with questions or concerns regarding your invoice.   IF you received labwork today, you will receive an invoice from [facility]. Please contact [facility] at [phone] with questions or concerns regarding your invoice.   Our billing staff will not be able to assist you with questions regarding bills from these companies.  You will be contacted with the lab results as soon as they are available. The fastest way to get your results is to activate your My Chart account. Instructions are located on the last page of this paperwork. If you have not heard from [name] regarding the results in 2 weeks, please contact this office.

## 2016-12-22 NOTE — Progress Notes (Signed)
By signing my name below, I, [name], attest that this documentation has been prepared under the direction and in the presence of [name], MD.  Electronically Signed: [name], Medical Scribe. 12/22/16. 10:21 AM.  Subjective:    Patient ID: Dakota Gutierrez, male    DOB: 1951/02/16, 66 y.o.   MRN: [id number]  HPI Chief Complaint  Patient presents with  . Follow-up    3 month f/u on A1C bloodwork    HPI Comments: Dakota Gutierrez is a 66 y.o. male who presents to the [facility] for DM follow-up.  Pt is fasting.  DM: He was continued on metformin 500 mg QD. 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 He is followed by ophthalmology. Reports occasional diarrhea and isn't sure if it's from his medication but plans on monitoring it around his medication regimen. Pt last saw his ophthalmologist March 2017, and plans on seeing them March 2018. Pt wasn't told to get on any new medications and was told it was plaque that blocked the artery. Pt exercises 3x a week and finds it difficult to lose weight. Pt had his flu shot this season but hasn't had his PNA vaccine. Denies experiencing changes in his vision or any new side effects from his medication. Lab Results  Component Value Date   HGBA1C 6.1 (H) 09/15/2016   Lab Results  Component Value Date   MICROALBUR 0.7 09/15/2016   Wt Readings from Last 3 Encounters:  12/22/16 192 lb 9.6 oz (87.4 kg)  09/15/16 191 lb 3.2 oz (86.7 kg)  09/12/16 192 lb (87.1 kg)  Body mass index is 32.3 kg/m.  Colon CA Screening: Reports occasional diarrhea. Pt had his colonoscopy 5 years ago and is due this year for his next colonoscopy.  Lipid Screening: Pt is not taking any cholesterol medications and hasn't started a statin in the past. Lab Results  Component Value Date   CHOL 121 (L) 03/02/2016   HDL 23 (L) 03/02/2016   LDLCALC 37 03/02/2016   LDLDIRECT 90.5 08/06/2013   TRIG 303 (H)  03/02/2016   CHOLHDL 5.3 (H) 03/02/2016   HTN: Takes hyzaar QD, and toprol 100 mg QD. Denies experiencing chest pain, abdominal pain, and other negative side effects from his medication. Lab Results  Component Value Date   CREATININE 1.19 09/15/2016   Reports his bursitis subsided with the wrap.  Patient Active Problem List   Diagnosis Date Noted  . Type II diabetes mellitus ([place]) 01/16/2015  . Hypersomnolence 08/14/2012  . History of Helicobacter pylori infection 04/16/2012  . GERD (gastroesophageal reflux disease) 04/16/2012  . Elevated transaminase level 02/02/2012  . Obesity 12/29/2011  . 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 Syncope   . Ventricular tachycardia ([place])   . Anxiety    Past Medical History:  Diagnosis Date  . 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 Diabetes mellitus without complication ([place])   . Dual implantable cardiac defibrillator in situ    Medtronic [id number] Protecta  . Elevated transaminase level    10/2011  ? amio  . GERD (gastroesophageal reflux disease)   . HTN (hypertension)   . Other primary cardiomyopathies   . Syncope   . Tubular adenoma of colon   . Ventricular tachycardia ([place])    storm following ICD implant on amiodarone    Past Surgical History:  Procedure Laterality Date  . [place]     Medtronic [phone]  Protecta  . CHOLECYSTECTOMY    . GALLBLADDER SURGERY    . HERNIA REPAIR    . TONSILLECTOMY AND ADENOIDECTOMY     Allergies  Allergen Reactions  . Adhesive [Tape]     Swelling and welts formed   Prior to Admission medications   Medication Sig Start Date End Date Taking? Authorizing Provider  amiodarone (PACERONE) 200 MG tablet TAKE ONE-HALF TABLET BY MOUTH ONCE DAILY. 03/25/16   [name], MD  aspirin EC 81 MG tablet Take 1 tablet (81 mg total) by mouth daily. 11/11/11   [name], MD  Cholecalciferol (VITAMIN D) 2000 UNITS tablet Take 2,000 Units by mouth daily.     Historical Provider, MD  famotidine (PEPCID) 20 MG tablet Take 1 tablet (20 mg total) by mouth 2 (two) times daily as needed. Patient taking differently: Take 20 mg by mouth 2 (two) times daily as needed for heartburn or indigestion.  04/16/12   Jerene Bears, MD  losartan-hydrochlorothiazide (HYZAAR) 100-12.5 MG tablet Take 1 tablet by mouth daily. 09/15/16   Wendie Agreste, MD  metFORMIN (GLUCOPHAGE) 500 MG tablet TAKE 1 TABLET ONCE A DAY WITH LARGEST MEAL. 09/15/16   Wendie Agreste, MD  metoprolol succinate (TOPROL-XL) 100 MG 24 hr tablet Take 1 tablet (100 mg total) by mouth daily. Take with or immediately following a meal. 09/15/16   Wendie Agreste, MD  Omega-3 Fatty Acids (FISH OIL) 1000 MG CAPS Take 1 capsule by mouth 2 (two) times daily.     Historical Provider, MD  tadalafil (CIALIS) 20 MG tablet Take 0.5-1 tablets (10-20 mg total) by mouth daily as needed for erectile dysfunction. 09/15/16   Wendie Agreste, MD  valACYclovir (VALTREX) 1000 MG tablet Take 1 tablet (1,000 mg total) by mouth daily. 03/02/16   Wendie Agreste, MD  zoster vaccine live, PF, (ZOSTAVAX) 16109 UNT/0.65ML injection Inject 19,400 Units into the skin once. 08/17/15   Wendie Agreste, MD   Social History   Social History  . Marital status: Married    Spouse name: N/A  . Number of children: N/A  . Years of education: N/A   Occupational History  . Retired Pharmacist, hospital    Social History Main Topics  . Smoking status: Never Smoker  . Smokeless tobacco: Never Used  . Alcohol use 1.2 oz/week    2 Cans of beer per week     Comment: 4/week  . Drug use: No  . Sexual activity: Yes   Other Topics Concern  . Not on file   Social History Narrative   Divorced. Education: [employer]. Exercise: walking 3 times a week for 30 minutes.   Retired Pharmacist, hospital   Review of Systems  Constitutional: Negative for fatigue and unexpected weight change.  Eyes: Negative for visual disturbance.  Respiratory: Negative for cough,  chest tightness and shortness of breath.   Cardiovascular: Negative for chest pain, palpitations and leg swelling.  Gastrointestinal: Positive for diarrhea. Negative for abdominal pain, blood in stool and nausea.  Neurological: Negative for dizziness, light-headedness and headaches.   Objective:  Physical Exam  Constitutional: He is oriented to person, place, and time. He appears well-developed and well-nourished.  HENT:  Head: Normocephalic and atraumatic.  Eyes: EOM are normal. Pupils are equal, round, and reactive to light.  Neck: No JVD present. Carotid bruit is not present.  Cardiovascular: Normal rate and regular rhythm.  Exam reveals no friction rub.   No murmur heard. Pulmonary/Chest: Effort normal and breath sounds  normal. No respiratory distress. He has no wheezes. He has no rales.  Musculoskeletal: He exhibits no edema.  Neurological: He is alert and oriented to person, place, and time.  Skin: Skin is warm and dry.  Psychiatric: He has a normal mood and affect.  Vitals reviewed.  BP 138/85 (BP Location: Right Arm, Patient Position: Sitting, Cuff Size: Small)   Pulse (!) 109   Temp 98.1 F (36.7 C) (Oral)   Ht 5' 4.75" (1.645 m)   Wt 192 lb 9.6 oz (87.4 kg)   SpO2 97%   BMI 32.30 kg/m  Assessment & Plan:  Dakota Gutierrez is a 66 y.o. male Type 2 diabetes mellitus without complication, without long-term current use of insulin ([place]) - Plan: Hemoglobin A1C, Care order/instruction:  - check labs, continue metformin same dose, but given option to try off metformin temporarily to see if diarrhea changes. If improved off metformin, can look at other options.   - diet exercise to help with weight loss.   Hypertriglyceridemia - Plan: Comprehensive metabolic panel, Lipid panel  - check labs. likely will need statin, given hx of diabetes.   Essential hypertension - Plan: Comprehensive metabolic panel  - check labs, continue same dose Hyzaar, toprol for now. Also contonued on  amiodarone for hx of Vtach.   Need for vaccination against Streptococcus pneumoniae - Plan: Pneumococcal polysaccharide vaccine 23-valent greater than or equal to 2yo subcutaneous/IM  - pneumovax given.   No orders of the defined types were placed in this encounter.  Patient Instructions    Depending on the triglycerides, you may need to start a medication to treat cholesterol. Will wait on the blood work to return to make that decision.  No change in medications for now, but if you want to try to come off of metformin for 4-5 days to see if that decreases diarrhea, that is fine. If diarrhea persists, return to discuss further.  Pneumonia vaccine given today. Recheck in 3 months.   IF you received an x-ray today, you will receive an invoice from [facility]. Please contact [facility]iology at [phone] with questions or concerns regarding your invoice.   IF you received labwork today, you will receive an invoice from [facility]. Please contact [facility] at [phone] with questions or concerns regarding your invoice.   Our billing staff will not be able to assist you with questions regarding bills from these companies.  You will be contacted with the lab results as soon as they are available. The fastest way to get your results is to activate your My Chart account. Instructions are located on the last page of this paperwork. If you have not heard from [name] regarding the results in 2 weeks, please contact this office.      I personally performed the services described in this documentation, which was scribed in my presence. The recorded information has been reviewed and considered, and addended by me as needed.   Signed,   [name], MD [facility].  12/24/16 11:43 AM

## 2016-12-23 LAB — LIPID PANEL
CHOLESTEROL TOTAL: 149 mg/dL (ref 100–199)
Chol/HDL Ratio: 5.3 ratio units — ABNORMAL HIGH (ref 0.0–5.0)
HDL: 28 mg/dL — ABNORMAL LOW (ref >39)
Triglycerides: 410 mg/dL — ABNORMAL HIGH (ref 0–149)

## 2016-12-23 LAB — COMPREHENSIVE METABOLIC PANEL
A/G RATIO: 1.9 (ref 1.2–2.2)
ALBUMIN: 4.7 g/dL (ref 3.6–4.8)
ALK PHOS: 44 IU/L (ref 39–117)
ALT: 31 IU/L (ref 0–44)
AST: 34 IU/L (ref 0–40)
BILIRUBIN TOTAL: 0.4 mg/dL (ref 0.0–1.2)
BUN / CREAT RATIO: 19 (ref 10–24)
BUN: 21 mg/dL (ref 8–27)
CHLORIDE: 100 mmol/L (ref 96–106)
CO2: 21 mmol/L (ref 18–29)
CREATININE: 1.09 mg/dL (ref 0.76–1.27)
Calcium: 9.6 mg/dL (ref 8.6–10.2)
GFR calc Af Amer: 81 mL/min/{1.73_m2} (ref >59)
GFR calc non Af Amer: 70 mL/min/{1.73_m2} (ref >59)
GLOBULIN, TOTAL: 2.5 g/dL (ref 1.5–4.5)
Glucose: 136 mg/dL — ABNORMAL HIGH (ref 65–99)
POTASSIUM: 4.3 mmol/L (ref 3.5–5.2)
SODIUM: 140 mmol/L (ref 134–144)
Total Protein: 7.2 g/dL (ref 6.0–8.5)

## 2016-12-23 LAB — HEMOGLOBIN A1C
Est. average glucose Bld gHb Est-mCnc: 143 mg/dL
Hgb A1c MFr Bld: 6.6 % — ABNORMAL HIGH (ref 4.8–5.6)

## 2016-12-27 MED ORDER — ATORVASTATIN CALCIUM 10 MG PO TABS: 10.0000 mg | ORAL_TABLET | Freq: Every day | ORAL | 1 refills | Status: DC

## 2016-12-27 NOTE — Addendum Note (Signed)
Addended by: [name] R on: 12/27/2016 08:15 AM   Modules accepted: Orders

## 2017-01-12 ENCOUNTER — Telehealth: Payer: Self-pay | Admitting: Cardiology

## 2017-01-12 ENCOUNTER — Encounter: Payer: Medicare Other | Admitting: *Deleted

## 2017-01-12 NOTE — Telephone Encounter (Signed)
Spoke with pt and reminded pt of remote transmission that is due today. Pt verbalized understanding.   

## 2017-01-26 ENCOUNTER — Ambulatory Visit (INDEPENDENT_AMBULATORY_CARE_PROVIDER_SITE_OTHER): Payer: Medicare Other | Admitting: *Deleted

## 2017-01-26 DIAGNOSIS — I420 Dilated cardiomyopathy: Secondary | ICD-10-CM | POA: Diagnosis not present

## 2017-01-30 NOTE — Progress Notes (Signed)
Remote ICD transmission.   

## 2017-01-31 ENCOUNTER — Encounter: Payer: Self-pay | Admitting: Cardiology

## 2017-01-31 ENCOUNTER — Telehealth: Payer: Self-pay

## 2017-01-31 LAB — CUP PACEART REMOTE DEVICE CHECK
Brady Statistic AP VP Percent: 4.36 %
Brady Statistic AS VP Percent: 0.03 %
Brady Statistic RA Percent Paced: 73.05 %
Brady Statistic RV Percent Paced: 4.23 %
Date Time Interrogation Session: 20180301183247
HIGH POWER IMPEDANCE MEASURED VALUE: 323 Ohm
HIGH POWER IMPEDANCE MEASURED VALUE: 39 Ohm
HIGH POWER IMPEDANCE MEASURED VALUE: 46 Ohm
Implantable Lead Implant Date: 20120531
Implantable Lead Location: 753860
Implantable Lead Model: 4076
Lead Channel Impedance Value: 380 Ohm
Lead Channel Pacing Threshold Amplitude: 0.625 V
Lead Channel Sensing Intrinsic Amplitude: 12.625 mV
Lead Channel Sensing Intrinsic Amplitude: 12.625 mV
Lead Channel Setting Pacing Amplitude: 2.5 V
Lead Channel Setting Pacing Pulse Width: 0.4 ms
Lead Channel Setting Sensing Sensitivity: 0.3 mV
MDC IDC LEAD IMPLANT DT: 20120531
MDC IDC LEAD LOCATION: 753859
MDC IDC MSMT BATTERY VOLTAGE: 2.61 V
MDC IDC MSMT LEADCHNL RA IMPEDANCE VALUE: 437 Ohm
MDC IDC MSMT LEADCHNL RA PACING THRESHOLD AMPLITUDE: 0.5 V
MDC IDC MSMT LEADCHNL RA PACING THRESHOLD PULSEWIDTH: 0.4 ms
MDC IDC MSMT LEADCHNL RA SENSING INTR AMPL: 2.25 mV
MDC IDC MSMT LEADCHNL RA SENSING INTR AMPL: 2.25 mV
MDC IDC MSMT LEADCHNL RV PACING THRESHOLD PULSEWIDTH: 0.4 ms
MDC IDC PG IMPLANT DT: 20120531
MDC IDC SET LEADCHNL RA PACING AMPLITUDE: 2 V
MDC IDC STAT BRADY AP VS PERCENT: 68.95 %
MDC IDC STAT BRADY AS VS PERCENT: 26.67 %

## 2017-02-01 NOTE — Telephone Encounter (Signed)
Spoke with pt about ATP episodes from Feb 23 and 26, pt checked his calendar and reported that he did not recall any events that stood out on those dates. Pt aware of DMV driving restrictions. Informed pt that battery was nearing ERI and that he will hear an alert tone as well as as the clinic will receive an alert as long as his home monitor is plugged in. Pt has apt scheduled for 03/22/2017 with [facility]. Informed pt that if he does hear the tone before apt, will plan on keeping that apt. Informed pt that he had 3 months to get his device changed out after he reaches RRT. Pt voiced understanding.

## 2017-02-13 ENCOUNTER — Telehealth: Payer: Self-pay

## 2017-02-13 NOTE — Telephone Encounter (Signed)
Called pt and let him know that his device had reached the RRT, and that if the alert tone bothers him then he can come into the office and have it turned off. Informed pt that he need to keep his upcoming apt with Dr. Caryl Comes on 4/9/ Pt voiced understanding.

## 2017-02-22 ENCOUNTER — Encounter: Payer: Self-pay | Admitting: Internal Medicine

## 2017-02-25 ENCOUNTER — Other Ambulatory Visit: Payer: Self-pay

## 2017-02-25 MED ORDER — ATORVASTATIN CALCIUM 10 MG PO TABS: 10.0000 mg | ORAL_TABLET | Freq: Every day | ORAL | 1 refills | Status: DC

## 2017-03-06 ENCOUNTER — Encounter: Payer: Self-pay | Admitting: *Deleted

## 2017-03-06 ENCOUNTER — Encounter: Payer: Self-pay | Admitting: Internal Medicine

## 2017-03-06 ENCOUNTER — Ambulatory Visit (INDEPENDENT_AMBULATORY_CARE_PROVIDER_SITE_OTHER): Payer: Medicare Other | Admitting: Internal Medicine

## 2017-03-06 VITALS — BP 120/70 | HR 80 | Ht 66.0 in | Wt 189.0 lb

## 2017-03-06 DIAGNOSIS — I495 Sick sinus syndrome: Secondary | ICD-10-CM

## 2017-03-06 DIAGNOSIS — I420 Dilated cardiomyopathy: Secondary | ICD-10-CM

## 2017-03-06 DIAGNOSIS — Z79899 Other long term (current) drug therapy: Secondary | ICD-10-CM

## 2017-03-06 DIAGNOSIS — Z95810 Presence of automatic (implantable) cardiac defibrillator: Secondary | ICD-10-CM | POA: Diagnosis not present

## 2017-03-06 DIAGNOSIS — Z01812 Encounter for preprocedural laboratory examination: Secondary | ICD-10-CM

## 2017-03-06 DIAGNOSIS — I472 Ventricular tachycardia, unspecified: Secondary | ICD-10-CM

## 2017-03-06 NOTE — Patient Instructions (Signed)
Medication Instructions: - Your physician recommends that you continue on your current medications as directed. Please refer to the Current Medication list given to you today.  Labwork: - Your physician recommends that you return for lab work: Monday 04/03/17 (BMP/CBC/ INR/TSH)  Procedures/Testing: - Your physician has recommended that you have a defibrillator generator (battery) change.   Follow-Up: - Your physician recommends that you schedule a follow-up appointment in: about 14 days (from 04/10/17) with the Device clinic for a wound check   Any Additional Special Instructions Will Be Listed Below (If Applicable).     If you need a refill on your cardiac medications before your next appointment, please call your pharmacy.

## 2017-03-06 NOTE — Progress Notes (Signed)
Thank you very much yet      Patient Care Team: Wendie Agreste, MD as PCP - General (Family Medicine) [name], MD (Cardiology)   HPI  Dakota Gutierrez is a 66 y.o. 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 Unfortunately, in the days that followed he developed VT storm with multiple shocks and was treated with amiodarone which has been gradually down titrated to 100 mg a day.  Amiodarone surveillance had demonstrated elevated liver function tests spring 2013 . We elected to discontinue the amiodarone albeit with some reluctance on the part of the family. When seen in June of recurrent episodes of ventricular tachycardia 2 of which required therapy. He was then resumed on amiodarone and referred for consultation with GI  most recently 4/17 transaminases were normal.      He has reached ERI.  He has had intercurrent pace terminated ventricular tachycardia.  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  There have been no palpitations, lightheadedness or syncope.       Myoview 2012-no ischemia ejection fraction 56% Echocardiogram 5/17 EF 45-50%  Past Medical History:  Diagnosis Date  . 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 Diabetes mellitus without complication ([place])   . Dual implantable cardiac defibrillator in situ    Medtronic [id number] Protecta  . Elevated transaminase level    10/2011  ? amio  . GERD (gastroesophageal reflux disease)   . HTN (hypertension)   . Other primary cardiomyopathies   . Syncope   . Tubular adenoma of colon   . Ventricular tachycardia ([place])    storm following ICD implant on amiodarone     Past Surgical History:  Procedure Laterality Date   . CARDIAC DEFIBRILLATOR PLACEMENT     Medtronic [id number] Protecta  . CHOLECYSTECTOMY    . GALLBLADDER SURGERY    . HERNIA REPAIR    . TONSILLECTOMY AND ADENOIDECTOMY      Current Outpatient Prescriptions  Medication Sig Dispense Refill  . amiodarone (PACERONE) 200 MG tablet TAKE ONE-HALF TABLET BY MOUTH ONCE DAILY. 45 tablet 3  . aspirin EC 81 MG tablet Take 1 tablet (81 mg total) by mouth daily.    Marland Kitchen atorvastatin (LIPITOR) 10 MG tablet Take 1 tablet (10 mg total) by mouth daily. 30 tablet 1  . Cholecalciferol (VITAMIN D) 2000 UNITS tablet Take 2,000 Units by mouth daily.    . famotidine (PEPCID) 20 MG tablet Take 1 tablet (20 mg total) by mouth 2 (two) times daily as needed. (Patient taking differently: Take 20 mg by mouth 2 (two) times daily as needed for heartburn or indigestion. ) 60 tablet 4  . losartan-hydrochlorothiazide (HYZAAR) 100-12.5 MG tablet Take 1 tablet by mouth daily. 90 tablet 1  . metFORMIN (GLUCOPHAGE) 500 MG tablet TAKE 1 TABLET ONCE A DAY WITH LARGEST MEAL. 90 tablet 2  . metoprolol succinate (TOPROL-XL) 100 MG 24 hr tablet Take 1 tablet (100 mg total) by mouth daily. Take with or immediately following a meal. 90 tablet 1  . Omega-3 Fatty Acids (FISH OIL) 1000 MG CAPS Take 1 capsule by mouth 2 (two) times daily.     . tadalafil (CIALIS) 20 MG tablet Take 0.5-1 tablets (10-20 mg total)  by mouth daily as needed for erectile dysfunction. 10 tablet 11  . valACYclovir (VALTREX) 1000 MG tablet Take 1 tablet (1,000 mg total) by mouth daily. 30 tablet 11  . zoster vaccine live, PF, (ZOSTAVAX) 54360 UNT/0.65ML injection Inject 19,400 Units into the skin once. 1 each 0   No current facility-administered medications for this visit.     Allergies  Allergen Reactions  . Adhesive [Tape]     Swelling and welts formed    Review of Systems negative except from HPI and PMH except for complaints of back pain and muscle pain  Physical Exam BP 120/70   Pulse 80   Ht 5\' 6"   (1.676 m)   Wt 189 lb (85.7 kg)   SpO2 98%   BMI 30.51 kg/m  Well developed and well nourished in no acute distress HENT normal E scleral and icterus clear Neck Supple JVP flat; carotids brisk and full Clear to ausculation  Regular rate and rhythm, no murmurs gallops or rub Soft with active bowel sounds No clubbing cyanosis none Edema Alert and oriented, grossly normal motor and sensory function Skin Warm and Dry  ECG is atrial pacing  At 80 20/11/37 -21  Assessment and  Plan  VT Recurrent treated Ventricular tachycardia --ATPX2    ICD Medtronic  The patient's device was interrogated.  The information was reviewed. No changes were made in the programming.    Sinus noded dysfunction   Amiodarone rx    Euvolemic continue current meds  Recurrent VT  Rx with ATP   conintinue current meds;  Will reasess LV function--years since last done  Check TSH for amio;  Needs to be checked about 1 year  We have reviewed the benefits and risks of generator replacement.  These include but are not limited to lead fracture and infection.  The patient understands, agrees and is willing to proceed.

## 2017-03-07 ENCOUNTER — Other Ambulatory Visit: Payer: Self-pay | Admitting: Internal Medicine

## 2017-03-07 LAB — CUP PACEART INCLINIC DEVICE CHECK
Implantable Lead Implant Date: 20120531
Implantable Lead Implant Date: 20120531
Implantable Lead Location: 753859
Implantable Lead Model: 4076
Implantable Lead Model: 6947
Implantable Pulse Generator Implant Date: 20120531
MDC IDC LEAD LOCATION: 753860
MDC IDC SESS DTM: 20180410201959

## 2017-03-13 ENCOUNTER — Encounter: Payer: Self-pay | Admitting: Internal Medicine

## 2017-03-20 ENCOUNTER — Encounter: Payer: Medicare Other | Admitting: Internal Medicine

## 2017-03-22 ENCOUNTER — Encounter: Payer: Medicare Other | Admitting: Internal Medicine

## 2017-03-23 ENCOUNTER — Ambulatory Visit: Payer: Medicare Other | Admitting: Family Medicine

## 2017-03-30 ENCOUNTER — Ambulatory Visit (INDEPENDENT_AMBULATORY_CARE_PROVIDER_SITE_OTHER): Payer: Medicare Other | Admitting: Family Medicine

## 2017-03-30 ENCOUNTER — Encounter: Payer: Self-pay | Admitting: Family Medicine

## 2017-03-30 VITALS — BP 150/78 | HR 95 | Temp 98.5°F | Resp 16 | Ht 65.0 in | Wt 190.8 lb

## 2017-03-30 DIAGNOSIS — I10 Essential (primary) hypertension: Secondary | ICD-10-CM | POA: Diagnosis not present

## 2017-03-30 DIAGNOSIS — Z1329 Encounter for screening for other suspected endocrine disorder: Secondary | ICD-10-CM

## 2017-03-30 DIAGNOSIS — Z1211 Encounter for screening for malignant neoplasm of colon: Secondary | ICD-10-CM

## 2017-03-30 DIAGNOSIS — R0683 Snoring: Secondary | ICD-10-CM | POA: Diagnosis not present

## 2017-03-30 DIAGNOSIS — Z130 Encounter for screening for diseases of the blood and blood-forming organs and certain disorders involving the immune mechanism: Secondary | ICD-10-CM

## 2017-03-30 DIAGNOSIS — E119 Type 2 diabetes mellitus without complications: Secondary | ICD-10-CM

## 2017-03-30 DIAGNOSIS — E781 Pure hyperglyceridemia: Secondary | ICD-10-CM

## 2017-03-30 DIAGNOSIS — Z79899 Other long term (current) drug therapy: Secondary | ICD-10-CM

## 2017-03-30 DIAGNOSIS — Z5181 Encounter for therapeutic drug level monitoring: Secondary | ICD-10-CM

## 2017-03-30 MED ORDER — METFORMIN HCL 500 MG PO TABS: ORAL_TABLET | ORAL | 2 refills | Status: DC

## 2017-03-30 MED ORDER — METOPROLOL SUCCINATE ER 100 MG PO TB24: 100.0000 mg | ORAL_TABLET | Freq: Every day | ORAL | 1 refills | Status: DC

## 2017-03-30 MED ORDER — ATORVASTATIN CALCIUM 10 MG PO TABS: 10.0000 mg | ORAL_TABLET | Freq: Every day | ORAL | 1 refills | Status: DC

## 2017-03-30 MED ORDER — LOSARTAN POTASSIUM-HCTZ 100-12.5 MG PO TABS: 1.0000 | ORAL_TABLET | Freq: Every day | ORAL | 1 refills | Status: DC

## 2017-03-30 NOTE — Patient Instructions (Addendum)
Keep a record of your blood pressures outside of the office and if over 140/90, let me know as she may need to change medications. Your home readings and last office visit with cardiology had a normal blood pressure.  No change in diabetes medication for now. Recheck that level in 6 months.  I checked the lab work that cardiology requested as well as your cholesterol test today. Continue Lipitor once per day.  I referred you to gastroenterology for colonoscopy as well as to sleep specialist for sleep study as discussed. Let me know if you have any questions.   How to Take Your Blood Pressure Blood pressure is a measurement of how strongly your blood is pressing against the walls of your arteries. Arteries are blood vessels that carry blood from your heart throughout your body. Your health care provider takes your blood pressure at each office visit. You can also take your own blood pressure at home with a blood pressure machine. You may need to take your own blood pressure:  To confirm a diagnosis of high blood pressure (hypertension).  To monitor your blood pressure over time.  To make sure your blood pressure medicine is working. Supplies needed: To take your blood pressure, you will need a blood pressure machine. You can buy a blood pressure machine, or blood pressure monitor, at most drugstores or online. There are several types of home blood pressure monitors. When choosing one, consider the following:  Choose a monitor that has an arm cuff.  Choose a monitor that wraps snugly around your upper arm. You should be able to fit only one finger between your arm and the cuff.  Do not choose a monitor that measures your blood pressure from your wrist or finger. Your health care provider can suggest a reliable monitor that will meet your needs. How to prepare To get the most accurate reading, avoid the following for 30 minutes before you check your blood pressure:  Drinking  caffeine.  Drinking alcohol.  Eating.  Smoking.  Exercising. Five minutes before you check your blood pressure:  Empty your bladder.  Sit quietly without talking in a dining chair, rather than in a soft couch or armchair. How to take your blood pressure To check your blood pressure, follow the instructions in the manual that came with your blood pressure monitor. If you have a digital blood pressure monitor, the instructions may be as follows: 1. Sit up straight. 2. Place your feet on the floor. Do not cross your ankles or legs. 3. Rest your left arm at the level of your heart on a table or desk or on the arm of a chair. 4. Pull up your shirt sleeve. 5. Wrap the blood pressure cuff around the upper part of your left arm, 1 inch (2.5 cm) above your elbow. It is best to wrap the cuff around bare skin. 6. Fit the cuff snugly around your arm. You should be able to place only one finger between the cuff and your arm. 7. Position the cord inside the groove of your elbow. 8. Press the power button. 9. Sit quietly while the cuff inflates and deflates. 10. Read the digital reading on the monitor screen and write it down (record it). 11. Wait 2-3 minutes, then repeat the steps, starting at step 1. What does my blood pressure reading mean? A blood pressure reading consists of a higher number over a lower number. Ideally, your blood pressure should be below 120/80. The first ("top") number is  called the systolic pressure. It is a measure of the pressure in your arteries as your heart beats. The second ("bottom") number is called the diastolic pressure. It is a measure of the pressure in your arteries as the heart relaxes. Blood pressure is classified into four stages. The following are the stages for adults who do not have a short-term serious illness or a chronic condition. Systolic pressure and diastolic pressure are measured in a unit called mm Hg. Normal   Systolic pressure: below  120.  Diastolic pressure: below 80. Elevated   Systolic pressure: 628-315.  Diastolic pressure: below 80. Hypertension stage 1   Systolic pressure: 176-160.  Diastolic pressure: 73-71. Hypertension stage 2   Systolic pressure: 062 or above.  Diastolic pressure: 90 or above. You can have prehypertension or hypertension even if only the systolic or only the diastolic number in your reading is higher than normal. Follow these instructions at home:  Check your blood pressure as often as recommended by your health care provider.  Take your monitor to the next appointment with your health care provider to make sure:  That you are using it correctly.  That it provides accurate readings.  Be sure you understand what your goal blood pressure numbers are.  Tell your health care provider if you are having any side effects from blood pressure medicine. Contact a health care provider if:  Your blood pressure is consistently high. Get help right away if:  Your systolic blood pressure is higher than 180.  Your diastolic blood pressure is higher than 110. This information is not intended to replace advice given to you by your health care provider. Make sure you discuss any questions you have with your health care provider. Document Released: 04/22/2016 Document Revised: 07/05/2016 Document Reviewed: 04/22/2016 Elsevier Interactive Patient Education  2017 [employer].    IF you received an x-ray today, you will receive an invoice from [facility]. Please contact [facility] at [phone] with questions or concerns regarding your invoice.   IF you received labwork today, you will receive an invoice from [place]. Please contact [facility] at [phone] with questions or concerns regarding your invoice.   Our billing staff will not be able to assist you with questions regarding bills from these companies.  You will be contacted with the lab results as soon as  they are available. The fastest way to get your results is to activate your My Chart account. Instructions are located on the last page of this paperwork. If you have not heard from [name] regarding the results in 2 weeks, please contact this office.

## 2017-03-30 NOTE — Progress Notes (Signed)
By signing my name below, I, Dakota Gutierrez, attest that this documentation has been prepared under the direction and in the presence of Dakota Ray, MD.  Electronically Signed: Verlee Gutierrez, Medical Scribe. 03/30/17. 11:58 AM.  Subjective:    Patient ID: Dakota Gutierrez, male    DOB: November 21, 1951, 66 y.o.   MRN: [id number]  HPI  Chief Complaint  Patient presents with  . Follow-up    diabetes - PER PATIENT HE DOES NOT NEED REFILLS    HPI Comments: Dakota Gutierrez is a 66 y.o. male who presents to Primary Care at [facility] for follow-up. Pt is fasting.  DM: Takes metformin 500 mg QD. We discussed possible trial off of metformin due to diarrhea on metformin in Jan 2018 as well as diet and exercise. Pt is complaint with metformin. Diarrhea improved after discontinuing and after getting back on metformin, but it's not completely resolved with occasional loose stool. Last ophthalmologist appt 1-2 months ago and he recommended him to get a sleep study due to snoring, waking up tired, and waking up 2-3x a night. Denies daytime somnolence and PMHx of sleep apnea at this time.  Lab Results  Component Value Date   HGBA1C 6.6 (H) 12/22/2016   Lab Results  Component Value Date   MICROALBUR 0.7 09/15/2016   Wt Readings from Last 3 Encounters:  03/30/17 190 lb 12.8 oz (86.5 kg)  03/06/17 189 lb (85.7 kg)  12/22/16 192 lb 9.6 oz (87.4 kg)  Body mass index is 31.75 kg/m.  HLD: Started on lipitor 10 mg QD. Plan on repeat testing today. Pt is complaint with lipitor and denies experiencing negative effects. Lab Results  Component Value Date   CHOL 149 12/22/2016   HDL 28 (L) 12/22/2016   LDLCALC Comment 12/22/2016   LDLDIRECT 90.5 08/06/2013   TRIG 410 (H) 12/22/2016   CHOLHDL 5.3 (H) 12/22/2016   Lab Results  Component Value Date   ALT 31 12/22/2016   AST 34 12/22/2016   ALKPHOS 44 12/22/2016   BILITOT 0.4 12/22/2016   V-Tach: Followed by Dr. Caryl Comes. Treated with  amiodarone, but that caused elevated liver test in 2013. Had to restart amiodarone, plan on placement of generator/battery change as well as recommended TSH check with amiodarone use for pace maker. TSH was nl in 2017.  Pt is getting his battery replaced in 2 weeks.  HTN: He takes toprol 100 mg QD, hyzaar 100/12.5 mg QD, ASA 81 mg QD. BP has been measuring around 120-125/80-85. He suspects his bp today is up from a salty meal last night. Pt has been compliant with his medication. Denies experiencing chest pain, light-headedness, dizziness, SOB, melena or acute sxs. any negative side effects from his medication. Lab Results  Component Value Date   CREATININE 1.09 12/22/2016   BP Readings from Last 3 Encounters:  03/30/17 (!) 152/102  03/06/17 120/70  12/22/16 138/85   Hemorrhoids: Usually occurs when he travels.  Colon CA Screening: Pt would like to get a referral to get a colonoscopy. Colonoscopy 03/2012. Single polyp in the descending colon, lipoma in the transverse colon, hemorrhoids noted, erythema in the rectum with biopsy performed, tubular adenoma. Other biopsies where unremarkable, planned for repeat colonoscopy in 5 years.  Patient Active Problem List   Diagnosis Date Noted  . Type II diabetes mellitus ([place]) 01/16/2015  . Hypersomnolence 08/14/2012  . History of Helicobacter pylori infection 04/16/2012  . GERD (gastroesophageal reflux disease) 04/16/2012  . Elevated transaminase level 02/02/2012  . Obesity  12/29/2011  . 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 Syncope   . Ventricular tachycardia ([place])   . Anxiety    Past Medical History:  Diagnosis Date  . 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 Diabetes mellitus without complication ([place])   . Dual implantable cardiac defibrillator in situ    Medtronic D314DRG Protecta  . Elevated transaminase level    10/2011  ? amio  . GERD (gastroesophageal reflux disease)   . HTN (hypertension)   . Other  primary cardiomyopathies   . Syncope   . Tubular adenoma of colon   . Ventricular tachycardia ([place])    storm following ICD implant on amiodarone    Past Surgical History:  Procedure Laterality Date  . CARDIAC DEFIBRILLATOR PLACEMENT     Medtronic D314DRG Protecta  . CHOLECYSTECTOMY    . GALLBLADDER SURGERY    . HERNIA REPAIR    . TONSILLECTOMY AND ADENOIDECTOMY     Allergies  Allergen Reactions  . Adhesive [Tape]     Swelling and welts formed   Prior to Admission medications   Medication Sig Start Date End Date Taking? Authorizing Provider  amiodarone (PACERONE) 200 MG tablet TAKE ONE-HALF TABLET BY MOUTH ONCE DAILY. 03/25/16  Yes [name], MD  aspirin EC 81 MG tablet Take 1 tablet (81 mg total) by mouth daily. 11/11/11  Yes [name], MD  atorvastatin (LIPITOR) 10 MG tablet Take 1 tablet (10 mg total) by mouth daily. 02/25/17  Yes Wendie Agreste, MD  Cholecalciferol (VITAMIN D) 2000 UNITS tablet Take 2,000 Units by mouth daily.   Yes Historical Provider, MD  famotidine (PEPCID) 20 MG tablet Take 1 tablet (20 mg total) by mouth 2 (two) times daily as needed. Patient taking differently: Take 20 mg by mouth 2 (two) times daily as needed for heartburn or indigestion.  04/16/12  Yes Jerene Bears, MD  losartan-hydrochlorothiazide (HYZAAR) 100-12.5 MG tablet Take 1 tablet by mouth daily. 09/15/16  Yes Wendie Agreste, MD  metFORMIN (GLUCOPHAGE) 500 MG tablet TAKE 1 TABLET ONCE A DAY WITH LARGEST MEAL. 09/15/16  Yes Wendie Agreste, MD  metoprolol succinate (TOPROL-XL) 100 MG 24 hr tablet Take 1 tablet (100 mg total) by mouth daily. Take with or immediately following a meal. 09/15/16  Yes Wendie Agreste, MD  Omega-3 Fatty Acids (FISH OIL) 1000 MG CAPS Take 1 capsule by mouth 2 (two) times daily.    Yes Historical Provider, MD  tadalafil (CIALIS) 20 MG tablet Take 0.5-1 tablets (10-20 mg total) by mouth daily as needed for erectile dysfunction. 09/15/16  Yes Wendie Agreste, MD   valACYclovir (VALTREX) 1000 MG tablet Take 1 tablet (1,000 mg total) by mouth daily. 03/02/16  Yes Wendie Agreste, MD  zoster vaccine live, PF, (ZOSTAVAX) 39030 UNT/0.65ML injection Inject 19,400 Units into the skin once. 08/17/15   Wendie Agreste, MD   Social History   Social History  . Marital status: Married    Spouse name: N/A  . Number of children: N/A  . Years of education: N/A   Occupational History  . Retired Pharmacist, hospital    Social History Main Topics  . Smoking status: Never Smoker  . Smokeless tobacco: Never Used  . Alcohol use 1.2 oz/week    2 Cans of beer per week     Comment: 4/week  . Drug use: No  . Sexual activity: Yes   Other Topics Concern  . Not on file  Social History Narrative   Divorced. Education: [employer]. Exercise: walking 3 times a week for 30 minutes.   Retired Pharmacist, hospital   Review of Systems  Constitutional: Negative for fatigue and unexpected weight change.  Eyes: Negative for visual disturbance.  Respiratory: Negative for cough, chest tightness and shortness of breath.   Cardiovascular: Negative for chest pain, palpitations and leg swelling.  Gastrointestinal: Positive for diarrhea. Negative for abdominal pain and blood in stool.  Neurological: Negative for dizziness, light-headedness and headaches.  Psychiatric/Behavioral: Positive for sleep disturbance.    Objective:  Physical Exam  Constitutional: He is oriented to person, place, and time. He appears well-developed and well-nourished.  HENT:  Head: Normocephalic and atraumatic.  Eyes: EOM are normal. Pupils are equal, round, and reactive to light.  Neck: No JVD present. Carotid bruit is not present.  Cardiovascular: Normal rate, regular rhythm and normal heart sounds.  Exam reveals no gallop and no friction rub.   No murmur heard. Pulmonary/Chest: Effort normal and breath sounds normal. No respiratory distress. He has no wheezes. He has no rales.  Musculoskeletal: He exhibits no edema.    Neurological: He is alert and oriented to person, place, and time.  Skin: Skin is warm and dry.  Psychiatric: He has a normal mood and affect.  Vitals reviewed.   Vitals:   03/30/17 1121 03/30/17 1218  BP: (!) 152/102 (!) 150/78  Pulse: 95   Resp: 16   Temp: 98.5 F (36.9 C)   TempSrc: Oral   SpO2: 99%   Weight: 190 lb 12.8 oz (86.5 kg)   Height: 5\' 5"  (1.651 m)   Body mass index is 31.75 kg/m. Assessment & Plan:    KAREL TURPEN is a 66 y.o. male Type 2 diabetes mellitus without complication, without long-term current use of insulin ([place]) - Plan: Hemoglobin A1c, Care order/instruction:  -Check A1c, continue metformin same dose for now.  High risk medication use - Plan: Protime-INR, TSH Medication monitoring encounter - Plan: Protime-INR, CBC, TSH  -Check CBC, TSH, PT/INR with upcoming battery change for pacemaker/ICD and use of amiodarone.  Snoring - Plan: Ambulatory referral to Sleep Studies   Essential hypertension - Plan: Ambulatory referral to Sleep Studies, metoprolol succinate (TOPROL-XL) 100 MG 24 hr tablet, losartan-hydrochlorothiazide (HYZAAR) 100-12.5 MG tablet  -Elevated in office, but may be related to salty meal recently. Monitor outside of office, and if remaining elevated, contact for possible med changes  Screening, anemia, deficiency, iron - Plan: CBC Thyroid disorder screen - Plan: TSH  -As above, monitoring with other medications  Hypertriglyceridemia - Plan: Comprehensive metabolic panel, Lipid panel  -Tolerating Lipitor, check labs. No change in dose at this time  Screen for colon cancer - Plan: Ambulatory referral to Gastroenterology  -Due for repeat colonoscopy. Referral placed  Meds ordered this encounter  Medications  . metoprolol succinate (TOPROL-XL) 100 MG 24 hr tablet    Sig: Take 1 tablet (100 mg total) by mouth daily. Take with or immediately following a meal.    Dispense:  90 tablet    Refill:  1  . metFORMIN (GLUCOPHAGE) 500  MG tablet    Sig: TAKE 1 TABLET ONCE A DAY WITH LARGEST MEAL.    Dispense:  90 tablet    Refill:  2  . losartan-hydrochlorothiazide (HYZAAR) 100-12.5 MG tablet    Sig: Take 1 tablet by mouth daily.    Dispense:  90 tablet    Refill:  1  . atorvastatin (LIPITOR) 10 MG tablet  Sig: Take 1 tablet (10 mg total) by mouth daily.    Dispense:  90 tablet    Refill:  1   Patient Instructions    Keep a record of your blood pressures outside of the office and if over 140/90, let me know as she may need to change medications. Your home readings and last office visit with cardiology had a normal blood pressure.  No change in diabetes medication for now. Recheck that level in 6 months.  I checked the lab work that cardiology requested as well as your cholesterol test today. Continue Lipitor once per day.  I referred you to gastroenterology for colonoscopy as well as to sleep specialist for sleep study as discussed. Let me know if you have any questions.   How to Take Your Blood Pressure Blood pressure is a measurement of how strongly your blood is pressing against the walls of your arteries. Arteries are blood vessels that carry blood from your heart throughout your body. Your health care provider takes your blood pressure at each office visit. You can also take your own blood pressure at home with a blood pressure machine. You may need to take your own blood pressure:  To confirm a diagnosis of high blood pressure (hypertension).  To monitor your blood pressure over time.  To make sure your blood pressure medicine is working. Supplies needed: To take your blood pressure, you will need a blood pressure machine. You can buy a blood pressure machine, or blood pressure monitor, at most drugstores or online. There are several types of home blood pressure monitors. When choosing one, consider the following:  Choose a monitor that has an arm cuff.  Choose a monitor that wraps snugly around your  upper arm. You should be able to fit only one finger between your arm and the cuff.  Do not choose a monitor that measures your blood pressure from your wrist or finger. Your health care provider can suggest a reliable monitor that will meet your needs. How to prepare To get the most accurate reading, avoid the following for 30 minutes before you check your blood pressure:  Drinking caffeine.  Drinking alcohol.  Eating.  Smoking.  Exercising. Five minutes before you check your blood pressure:  Empty your bladder.  Sit quietly without talking in a dining chair, rather than in a soft couch or armchair. How to take your blood pressure To check your blood pressure, follow the instructions in the manual that came with your blood pressure monitor. If you have a digital blood pressure monitor, the instructions may be as follows: 1. Sit up straight. 2. Place your feet on the floor. Do not cross your ankles or legs. 3. Rest your left arm at the level of your heart on a table or desk or on the arm of a chair. 4. Pull up your shirt sleeve. 5. Wrap the blood pressure cuff around the upper part of your left arm, 1 inch (2.5 cm) above your elbow. It is best to wrap the cuff around bare skin. 6. Fit the cuff snugly around your arm. You should be able to place only one finger between the cuff and your arm. 7. Position the cord inside the groove of your elbow. 8. Press the power button. 9. Sit quietly while the cuff inflates and deflates. 10. Read the digital reading on the monitor screen and write it down (record it). 11. Wait 2-3 minutes, then repeat the steps, starting at step 1. What does my  blood pressure reading mean? A blood pressure reading consists of a higher number over a lower number. Ideally, your blood pressure should be below 120/80. The first ("top") number is called the systolic pressure. It is a measure of the pressure in your arteries as your heart beats. The second ("bottom")  number is called the diastolic pressure. It is a measure of the pressure in your arteries as the heart relaxes. Blood pressure is classified into four stages. The following are the stages for adults who do not have a short-term serious illness or a chronic condition. Systolic pressure and diastolic pressure are measured in a unit called mm Hg. Normal   Systolic pressure: below 607.  Diastolic pressure: below 80. Elevated   Systolic pressure: 371-062.  Diastolic pressure: below 80. Hypertension stage 1   Systolic pressure: 694-854.  Diastolic pressure: 62-70. Hypertension stage 2   Systolic pressure: 350 or above.  Diastolic pressure: 90 or above. You can have prehypertension or hypertension even if only the systolic or only the diastolic number in your reading is higher than normal. Follow these instructions at home:  Check your blood pressure as often as recommended by your health care provider.  Take your monitor to the next appointment with your health care provider to make sure:  That you are using it correctly.  That it provides accurate readings.  Be sure you understand what your goal blood pressure numbers are.  Tell your health care provider if you are having any side effects from blood pressure medicine. Contact a health care provider if:  Your blood pressure is consistently high. Get help right away if:  Your systolic blood pressure is higher than 180.  Your diastolic blood pressure is higher than 110. This information is not intended to replace advice given to you by your health care provider. Make sure you discuss any questions you have with your health care provider. Document Released: 04/22/2016 Document Revised: 07/05/2016 Document Reviewed: 04/22/2016 Elsevier Interactive Patient Education  2017 [employer].    IF you received an x-Gutierrez today, you will receive an invoice from [facility]. Please contact [facility] at [phone]  with questions or concerns regarding your invoice.   IF you received labwork today, you will receive an invoice from [facility]. Please contact [facility] at [phone] with questions or concerns regarding your invoice.   Our billing staff will not be able to assist you with questions regarding bills from these companies.  You will be contacted with the lab results as soon as they are available. The fastest way to get your results is to activate your My Chart account. Instructions are located on the last page of this paperwork. If you have not heard from [name] regarding the results in 2 weeks, please contact this office.      I personally performed the services described in this documentation, which was scribed in my presence. The recorded information has been reviewed and considered for accuracy and completeness, addended by me as needed, and agree with information above.  Signed,   Dakota Ray, MD Primary [facility] at [facility].  04/02/17 10:23 AM

## 2017-03-31 LAB — COMPREHENSIVE METABOLIC PANEL
A/G RATIO: 2 (ref 1.2–2.2)
ALT: 25 IU/L (ref 0–44)
AST: 28 IU/L (ref 0–40)
Albumin: 4.7 g/dL (ref 3.6–4.8)
Alkaline Phosphatase: 44 IU/L (ref 39–117)
BUN/Creatinine Ratio: 18 (ref 10–24)
BUN: 18 mg/dL (ref 8–27)
Bilirubin Total: 0.5 mg/dL (ref 0.0–1.2)
CALCIUM: 9.6 mg/dL (ref 8.6–10.2)
CO2: 23 mmol/L (ref 18–29)
CREATININE: 0.98 mg/dL (ref 0.76–1.27)
Chloride: 98 mmol/L (ref 96–106)
GFR, EST AFRICAN AMERICAN: 91 mL/min/{1.73_m2} (ref >59)
GFR, EST NON AFRICAN AMERICAN: 79 mL/min/{1.73_m2} (ref >59)
GLOBULIN, TOTAL: 2.3 g/dL (ref 1.5–4.5)
Glucose: 125 mg/dL — ABNORMAL HIGH (ref 65–99)
POTASSIUM: 4.3 mmol/L (ref 3.5–5.2)
Sodium: 140 mmol/L (ref 134–144)
TOTAL PROTEIN: 7 g/dL (ref 6.0–8.5)

## 2017-03-31 LAB — LIPID PANEL
CHOL/HDL RATIO: 3.6 ratio (ref 0.0–5.0)
Cholesterol, Total: 119 mg/dL (ref 100–199)
HDL: 33 mg/dL — ABNORMAL LOW (ref >39)
LDL CALC: 34 mg/dL (ref 0–99)
Triglycerides: 258 mg/dL — ABNORMAL HIGH (ref 0–149)
VLDL Cholesterol Cal: 52 mg/dL — ABNORMAL HIGH (ref 5–40)

## 2017-03-31 LAB — CBC
HEMATOCRIT: 44.1 % (ref 37.5–51.0)
HEMOGLOBIN: 14.7 g/dL (ref 13.0–17.7)
MCH: 29.2 pg (ref 26.6–33.0)
MCHC: 33.3 g/dL (ref 31.5–35.7)
MCV: 88 fL (ref 79–97)
Platelets: 173 10*3/uL (ref 150–379)
RBC: 5.03 x10E6/uL (ref 4.14–5.80)
RDW: 13.5 % (ref 12.3–15.4)
WBC: 4.8 10*3/uL (ref 3.4–10.8)

## 2017-03-31 LAB — PROTIME-INR
INR: 1 (ref 0.8–1.2)
Prothrombin Time: 11.1 s (ref 9.1–12.0)

## 2017-03-31 LAB — HEMOGLOBIN A1C
Est. average glucose Bld gHb Est-mCnc: 143 mg/dL
HEMOGLOBIN A1C: 6.6 % — AB (ref 4.8–5.6)

## 2017-03-31 LAB — TSH: TSH: 3.84 u[IU]/mL (ref 0.450–4.500)

## 2017-04-03 ENCOUNTER — Other Ambulatory Visit: Payer: Medicare Other

## 2017-04-12 ENCOUNTER — Telehealth: Payer: Self-pay | Admitting: Internal Medicine

## 2017-04-12 NOTE — Telephone Encounter (Signed)
I left a message for the patient to call to confirm the time for his ICD generator change on 04/26/17.

## 2017-04-12 NOTE — Telephone Encounter (Signed)
Follow up ° ° ° ° ° °Returning a call to the nurse °

## 2017-04-12 NOTE — Telephone Encounter (Signed)
I spoke with the patient- he is aware to arrive at 9:30 am on 04/26/17 for his ICD generator change out.

## 2017-04-19 ENCOUNTER — Other Ambulatory Visit: Payer: Self-pay | Admitting: Gastroenterology

## 2017-04-25 ENCOUNTER — Other Ambulatory Visit: Payer: Self-pay | Admitting: Physician Assistant

## 2017-04-25 ENCOUNTER — Other Ambulatory Visit: Payer: Self-pay | Admitting: Internal Medicine

## 2017-04-26 ENCOUNTER — Ambulatory Visit (HOSPITAL_COMMUNITY)
Admission: RE | Admit: 2017-04-26 | Discharge: 2017-04-26 | Disposition: A | Discharge status: Home / Self Care | Payer: Medicare Other | Source: Ambulatory Visit | Attending: Internal Medicine | Admitting: Internal Medicine

## 2017-04-26 ENCOUNTER — Ambulatory Visit: Payer: Medicare Other

## 2017-04-26 ENCOUNTER — Encounter (HOSPITAL_COMMUNITY): Payer: Self-pay | Admitting: Internal Medicine

## 2017-04-26 ENCOUNTER — Ambulatory Visit (HOSPITAL_COMMUNITY)
Admission: RE | Disposition: A | Discharge status: Home / Self Care | Payer: Self-pay | Source: Ambulatory Visit | Attending: Internal Medicine

## 2017-04-26 DIAGNOSIS — Z4502 Encounter for adjustment and management of automatic implantable cardiac defibrillator: Secondary | ICD-10-CM | POA: Insufficient documentation

## 2017-04-26 DIAGNOSIS — Z95810 Presence of automatic (implantable) cardiac defibrillator: Secondary | ICD-10-CM | POA: Diagnosis not present

## 2017-04-26 DIAGNOSIS — Z7982 Long term (current) use of aspirin: Secondary | ICD-10-CM | POA: Diagnosis not present

## 2017-04-26 DIAGNOSIS — I472 Ventricular tachycardia, unspecified: Secondary | ICD-10-CM

## 2017-04-26 DIAGNOSIS — I429 Cardiomyopathy, unspecified: Secondary | ICD-10-CM | POA: Insufficient documentation

## 2017-04-26 DIAGNOSIS — F419 Anxiety disorder, unspecified: Secondary | ICD-10-CM | POA: Insufficient documentation

## 2017-04-26 DIAGNOSIS — K219 Gastro-esophageal reflux disease without esophagitis: Secondary | ICD-10-CM | POA: Insufficient documentation

## 2017-04-26 DIAGNOSIS — I495 Sick sinus syndrome: Secondary | ICD-10-CM | POA: Insufficient documentation

## 2017-04-26 DIAGNOSIS — E119 Type 2 diabetes mellitus without complications: Secondary | ICD-10-CM | POA: Diagnosis not present

## 2017-04-26 DIAGNOSIS — I10 Essential (primary) hypertension: Secondary | ICD-10-CM | POA: Insufficient documentation

## 2017-04-26 HISTORY — PX: ICD GENERATOR CHANGEOUT: EP1231

## 2017-04-26 LAB — CBC
HCT: 40.9 % (ref 39.0–52.0)
Hemoglobin: 13.9 g/dL (ref 13.0–17.0)
MCH: 29.7 pg (ref 26.0–34.0)
MCHC: 34 g/dL (ref 30.0–36.0)
MCV: 87.4 fL (ref 78.0–100.0)
Platelets: 140 10*3/uL — ABNORMAL LOW (ref 150–400)
RBC: 4.68 MIL/uL (ref 4.22–5.81)
RDW: 12.9 % (ref 11.5–15.5)
WBC: 4.1 10*3/uL (ref 4.0–10.5)

## 2017-04-26 LAB — GLUCOSE, CAPILLARY
GLUCOSE-CAPILLARY: 165 mg/dL — AB (ref 65–99)
GLUCOSE-CAPILLARY: 152 mg/dL — AB (ref 65–99)

## 2017-04-26 LAB — SURGICAL PCR SCREEN
MRSA, PCR: NEGATIVE
STAPHYLOCOCCUS AUREUS: NEGATIVE

## 2017-04-26 LAB — BASIC METABOLIC PANEL
Anion gap: 8 (ref 5–15)
BUN: 14 mg/dL (ref 6–20)
CALCIUM: 8.9 mg/dL (ref 8.9–10.3)
CO2: 26 mmol/L (ref 22–32)
CREATININE: 1.09 mg/dL (ref 0.61–1.24)
Chloride: 102 mmol/L (ref 101–111)
Glucose, Bld: 163 mg/dL — ABNORMAL HIGH (ref 65–99)
Potassium: 3.9 mmol/L (ref 3.5–5.1)
SODIUM: 136 mmol/L (ref 135–145)

## 2017-04-26 SURGERY — ICD GENERATOR CHANGEOUT

## 2017-04-26 MED ORDER — CEFAZOLIN SODIUM-DEXTROSE 2-4 GM/100ML-% IV SOLN
2.0000 g | INTRAVENOUS | Status: AC
  Administered 2017-04-26: 2 g via INTRAVENOUS
  Filled 2017-04-26: qty 100

## 2017-04-26 MED ORDER — FENTANYL CITRATE (PF) 100 MCG/2ML IJ SOLN
INTRAMUSCULAR | Status: AC
Start: 2017-04-26 — End: ?
  Filled 2017-04-26: qty 2

## 2017-04-26 MED ORDER — LIDOCAINE HCL (PF) 1 % IJ SOLN
INTRAMUSCULAR | Status: AC
  Filled 2017-04-26: qty 60

## 2017-04-26 MED ORDER — CEFAZOLIN SODIUM-DEXTROSE 2-4 GM/100ML-% IV SOLN
INTRAVENOUS | Status: AC
  Filled 2017-04-26: qty 100

## 2017-04-26 MED ORDER — ACETAMINOPHEN 325 MG PO TABS
325.0000 mg | ORAL_TABLET | ORAL | Status: DC | PRN
  Filled 2017-04-26: qty 2

## 2017-04-26 MED ORDER — SODIUM CHLORIDE 0.9 % IR SOLN
Status: AC
  Filled 2017-04-26: qty 2

## 2017-04-26 MED ORDER — SODIUM CHLORIDE 0.9 % IV SOLN
INTRAVENOUS | Status: DC
  Administered 2017-04-26: 11:00:00 via INTRAVENOUS

## 2017-04-26 MED ORDER — ONDANSETRON HCL 4 MG/2ML IJ SOLN: 4.0000 mg | Freq: Four times a day (QID) | INTRAMUSCULAR | Status: DC | PRN

## 2017-04-26 MED ORDER — LIDOCAINE HCL (PF) 1 % IJ SOLN
INTRAMUSCULAR | Status: DC | PRN
  Administered 2017-04-26: 48 mL

## 2017-04-26 MED ORDER — SODIUM CHLORIDE 0.9 % IV SOLN: INTRAVENOUS | Status: AC

## 2017-04-26 MED ORDER — SODIUM CHLORIDE 0.9 % IR SOLN
80.0000 mg | Status: AC
  Administered 2017-04-26: 80 mg
  Filled 2017-04-26: qty 2

## 2017-04-26 MED ORDER — FENTANYL CITRATE (PF) 100 MCG/2ML IJ SOLN
INTRAMUSCULAR | Status: AC
  Filled 2017-04-26: qty 2

## 2017-04-26 MED ORDER — MIDAZOLAM HCL 5 MG/5ML IJ SOLN
INTRAMUSCULAR | Status: AC
  Filled 2017-04-26: qty 5

## 2017-04-26 MED ORDER — MIDAZOLAM HCL 2 MG/2ML IJ SOLN
INTRAMUSCULAR | Status: DC | PRN
  Administered 2017-04-26 (×3): 1 mg via INTRAVENOUS
  Administered 2017-04-26: 2 mg via INTRAVENOUS

## 2017-04-26 MED ORDER — FENTANYL CITRATE (PF) 100 MCG/2ML IJ SOLN
INTRAMUSCULAR | Status: DC | PRN
  Administered 2017-04-26: 25 ug via INTRAVENOUS
  Administered 2017-04-26: 50 ug via INTRAVENOUS
  Administered 2017-04-26 (×2): 25 ug via INTRAVENOUS

## 2017-04-26 MED ORDER — MUPIROCIN 2 % EX OINT
TOPICAL_OINTMENT | CUTANEOUS | Status: AC
  Administered 2017-04-26: 11:00:00
  Filled 2017-04-26: qty 22

## 2017-04-26 SURGICAL SUPPLY — 6 items
CABLE SURGICAL S-101-97-12 (CABLE) ×2 IMPLANT
HEMOSTAT SURGICEL 2X4 FIBR (HEMOSTASIS) ×2 IMPLANT
ICD EVERA XT MRI DF1  DDMB1D1 (ICD Generator) ×1 IMPLANT
ICD EVERA XT MRI DF1 DDMB1D1 (ICD Generator) ×1 IMPLANT
PAD DEFIB LIFELINK (PAD) ×2 IMPLANT
TRAY PACEMAKER INSERTION (PACKS) ×2 IMPLANT

## 2017-04-26 NOTE — Discharge Instructions (Signed)

## 2017-04-26 NOTE — Interval H&P Note (Signed)
ICD Criteria  Current LVEF:45%. Within 12 months prior to implant: Yes   Heart failure history: Yes, Class II  Cardiomyopathy history: Yes, Non-Ischemic Cardiomyopathy.  Atrial Fibrillation/Atrial Flutter: No.  Ventricular tachycardia history: Yes, Hemodynamic instability present. VT Type: Sustained Ventricular Tachycardia - Monomorphic and Polymorphic.  Cardiac arrest history: No.  History of syndromes with risk of sudden death: No.  Previous ICD: Yes, Reason for ICD:  Secondary prevention.  Current ICD indication: Secondary  PPM indication: Yes. Pacing type: Atrial. Less than 40% RV pacing requirement anticipated. Indication: Sick Sinus Syndrome   Class I or II Bradycardia indication present: Yes  Beta Blocker therapy for 3 or more months: No, medical reason.  Ace Inhibitor/ARB therapy for 3 or more months: No, medical reason.  History and Physical Interval Note:  04/26/2017 11:11 AM  Dakota Gutierrez  has presented today for surgery, with the diagnosis of vt - eri  The various methods of treatment have been discussed with the patient and family. After consideration of risks, benefits and other options for treatment, the patient has consented to  Procedure(s): ICD [employer] (N/A) as a surgical intervention .  The patient's history has been reviewed, patient examined, no change in status, stable for surgery.  I have reviewed the patient's chart and labs.  Questions were answered to the patient's satisfaction.     Virl Axe

## 2017-04-26 NOTE — H&P (Signed)
Patient Care Team: Wendie Agreste, MD as PCP - General (Family Medicine) [name], MD (Cardiology)   HPI  Dakota Gutierrez is a 66 y.o. male Admitted for ICD generator change.  Initially implanted elsewhere for nonsustained ventricular tachycardia and easily inducible polymorphic ventricular tachycardia.  Unfortunately, in the days that followed he developed VT storm with multiple shocks and was treated with amiodarone which has been gradually down titrated to 100 mg a day.  Amiodarone surveillance had demonstrated elevated liver function tests spring 2013 . We elected to discontinue the amiodarone albeit with some reluctance on the part of the family. When seen in June of recurrent episodes of ventricular tachycardia 2 of which required therapy. He was then resumed on amiodarone and referred for consultation with GI  most recently 4/17 transaminases were normal.    He has had intercurrent pace terminated ventricular tachycardia.  No sx with VT episode   He has reached ERI.  The patient denies chest pain, shortness of breath, nocturnal dyspnea, orthopnea or peripheral edema.  There have been no palpitations, lightheadedness or syncope.   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  No interval evidence of atrial fib   Myoview 2012-no ischemia ejection fraction 56% Echocardiogram 5/17 EF 45-50%     Past Medical History:  Diagnosis Date  . 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 Diabetes mellitus without complication ([place])   . Dual implantable cardiac defibrillator in situ    Medtronic D314DRG Protecta  . Elevated transaminase level    10/2011  ? amio  . GERD (gastroesophageal reflux disease)   . HTN (hypertension)   . Other primary cardiomyopathies   . Syncope   . Tubular adenoma of colon   . Ventricular tachycardia ([place])    storm following ICD implant on amiodarone     Past Surgical History:    Procedure Laterality Date  . CARDIAC DEFIBRILLATOR PLACEMENT     Medtronic D314DRG Protecta  . CHOLECYSTECTOMY    . GALLBLADDER SURGERY    . HERNIA REPAIR    . TONSILLECTOMY AND ADENOIDECTOMY      Current Facility-Administered Medications  Medication Dose Route Frequency Provider Last Rate Last Dose  . 0.9 %  sodium chloride infusion   Intravenous Continuous [name], MD 50 mL/hr at 04/26/17 1038    . ceFAZolin (ANCEF) IVPB 2g/100 mL premix  2 g Intravenous To Cath [name], MD      . gentamicin (GARAMYCIN) 80 mg in sodium chloride irrigation 0.9 % 500 mL irrigation  80 mg Irrigation To Cath [name], MD        Allergies  Allergen Reactions  . Adhesive [Tape]     Swelling and welts formed      Social History  Substance Use Topics  . Smoking status: Never Smoker  . Smokeless tobacco: Never Used  . Alcohol use 1.2 oz/week    2 Cans of beer per week     Comment: 4/week     Family History  Problem Relation Age of Onset  . Hyperlipidemia Mother   . Heart disease Father   . Colon cancer Neg Hx   . Esophageal cancer Neg Hx   . Stomach cancer Neg Hx   . Rectal cancer Neg Hx      No current facility-administered medications on file prior to encounter.    Current Outpatient Prescriptions on  File Prior to Encounter  Medication Sig Dispense Refill  . aspirin EC 81 MG tablet Take 1 tablet (81 mg total) by mouth daily.    . Cholecalciferol (VITAMIN D) 2000 UNITS tablet Take 2,000 Units by mouth daily.    . famotidine (PEPCID) 20 MG tablet Take 1 tablet (20 mg total) by mouth 2 (two) times daily as needed. (Patient taking differently: Take 20 mg by mouth 2 (two) times daily as needed for heartburn or indigestion. ) 60 tablet 4  . Omega-3 Fatty Acids (FISH OIL) 1000 MG CAPS Take 1,000 mg by mouth 2 (two) times daily.     . tadalafil (CIALIS) 20 MG tablet Take 0.5-1 tablets (10-20 mg total) by mouth daily as needed for erectile dysfunction. 10 tablet 11  .  valACYclovir (VALTREX) 1000 MG tablet Take 1 tablet (1,000 mg total) by mouth daily. 30 tablet 11  . zoster vaccine live, PF, (ZOSTAVAX) 17494 UNT/0.65ML injection Inject 19,400 Units into the skin once. 1 each 0      Review of Systems negative except from HPI and PMH  Physical Exam BP (!) 183/102 (BP Location: Right Arm)   Pulse (!) 101   Temp 98.4 F (36.9 C) (Oral)   Ht 5\' 5"  (1.651 m)   Wt 190 lb (86.2 kg)   SpO2 100%   BMI 31.62 kg/m  Well developed and well nourished in no acute distress HENT normal E scleral and icterus clear Neck Supple JVP flat; carotids brisk and full Clear to ausculation Device pocket well healed; without hematoma or erythema.  There is no tethering  Regular rate and rhythm, no murmurs gallops or rub Soft with active bowel sounds No clubbing cyanosis  Edema Alert and oriented, grossly normal motor and sensory function Skin Warm and Dry    Assessment and  Plan  VT Recurrent treated Ventricular tachycardia --ATPX2    ICD Medtronic  The patient's device was interrogated.  The information was reviewed. No changes were made in the programming.    Sinus noded dysfunction   Amiodarone rx   Retinal Artery occlusion  The pts ICD is at [facility]  We have reviewed the benefits and risks of generator replacement.  These include but are not limited to lead fracture and infection.  The patient understands, agrees and is willing to proceed.    Will continue to monitor for atrial fib

## 2017-04-27 MED FILL — Midazolam HCl Inj 5 MG/5ML (Base Equivalent): INTRAMUSCULAR | Qty: 10 | Status: AC

## 2017-04-27 MED FILL — Midazolam HCl Inj 5 MG/5ML (Base Equivalent): INTRAMUSCULAR | Qty: 5 | Status: CN

## 2017-04-29 ENCOUNTER — Other Ambulatory Visit: Payer: Self-pay | Admitting: Physician Assistant

## 2017-05-10 ENCOUNTER — Ambulatory Visit (INDEPENDENT_AMBULATORY_CARE_PROVIDER_SITE_OTHER): Payer: Medicare Other | Admitting: *Deleted

## 2017-05-10 DIAGNOSIS — Z95810 Presence of automatic (implantable) cardiac defibrillator: Secondary | ICD-10-CM | POA: Diagnosis not present

## 2017-05-10 DIAGNOSIS — I472 Ventricular tachycardia, unspecified: Secondary | ICD-10-CM

## 2017-05-10 DIAGNOSIS — I495 Sick sinus syndrome: Secondary | ICD-10-CM | POA: Diagnosis not present

## 2017-05-10 DIAGNOSIS — I10 Essential (primary) hypertension: Secondary | ICD-10-CM

## 2017-05-10 LAB — CUP PACEART INCLINIC DEVICE CHECK
Battery Voltage: 3.05 V
Brady Statistic AP VP Percent: 15.23 %
Brady Statistic AS VP Percent: 0 %
Brady Statistic AS VS Percent: 3.3 %
Brady Statistic RA Percent Paced: 95.43 %
Brady Statistic RV Percent Paced: 14.65 %
Date Time Interrogation Session: 20180613144635
HIGH POWER IMPEDANCE MEASURED VALUE: 35 Ohm
HighPow Impedance: 46 Ohm
Implantable Lead Implant Date: 20120531
Implantable Lead Location: 753860
Implantable Lead Model: 4076
Implantable Lead Model: 6947
Implantable Pulse Generator Implant Date: 20180530
Lead Channel Impedance Value: 304 Ohm
Lead Channel Impedance Value: 399 Ohm
Lead Channel Pacing Threshold Amplitude: 0.5 V
Lead Channel Pacing Threshold Amplitude: 1 V
Lead Channel Sensing Intrinsic Amplitude: 10.5 mV
Lead Channel Sensing Intrinsic Amplitude: 10.625 mV
Lead Channel Sensing Intrinsic Amplitude: 2.375 mV
Lead Channel Setting Pacing Amplitude: 1.5 V
Lead Channel Setting Pacing Amplitude: 2 V
Lead Channel Setting Pacing Pulse Width: 0.4 ms
Lead Channel Setting Sensing Sensitivity: 0.3 mV
MDC IDC LEAD IMPLANT DT: 20120531
MDC IDC LEAD LOCATION: 753859
MDC IDC MSMT BATTERY REMAINING LONGEVITY: 110 mo
MDC IDC MSMT LEADCHNL RA IMPEDANCE VALUE: 399 Ohm
MDC IDC MSMT LEADCHNL RA PACING THRESHOLD PULSEWIDTH: 0.4 ms
MDC IDC MSMT LEADCHNL RA SENSING INTR AMPL: 1.625 mV
MDC IDC MSMT LEADCHNL RV PACING THRESHOLD PULSEWIDTH: 0.4 ms
MDC IDC STAT BRADY AP VS PERCENT: 81.47 %

## 2017-05-10 MED ORDER — METOPROLOL SUCCINATE ER 100 MG PO TB24: 100.0000 mg | ORAL_TABLET | Freq: Every day | ORAL | 2 refills | Status: DC

## 2017-05-10 NOTE — Progress Notes (Signed)
Wound check appointment s/p ICD generator change 04/26/17. Dermabond removed. Wound without edema. Incision edges approximated. Medial area of incision slightly reddened, unable to express any drainage, wound edges approximated. Patient advised to wash with soap and water and monitor incision. He is aware to call with any increased redness, swelling, drainage, fever or chills. Normal device function. Thresholds, sensing, and impedances consistent with implant measurements. Histogram distribution appropriate for patient and level of activity. No mode switches or ventricular arrhythmias noted. Patient educated about wound care, arm mobility, lifting restrictions, shock plan. ROV with SK 08/09/17.

## 2017-05-18 ENCOUNTER — Ambulatory Visit (INDEPENDENT_AMBULATORY_CARE_PROVIDER_SITE_OTHER): Payer: Medicare Other | Admitting: Neurology

## 2017-05-18 ENCOUNTER — Encounter: Payer: Self-pay | Admitting: Neurology

## 2017-05-18 VITALS — BP 152/89 | HR 101 | Ht 65.0 in | Wt 192.0 lb

## 2017-05-18 DIAGNOSIS — R0683 Snoring: Secondary | ICD-10-CM | POA: Diagnosis not present

## 2017-05-18 DIAGNOSIS — G2581 Restless legs syndrome: Secondary | ICD-10-CM

## 2017-05-18 DIAGNOSIS — R351 Nocturia: Secondary | ICD-10-CM

## 2017-05-18 DIAGNOSIS — Z95810 Presence of automatic (implantable) cardiac defibrillator: Secondary | ICD-10-CM

## 2017-05-18 DIAGNOSIS — E669 Obesity, unspecified: Secondary | ICD-10-CM | POA: Diagnosis not present

## 2017-05-18 DIAGNOSIS — G478 Other sleep disorders: Secondary | ICD-10-CM

## 2017-05-18 DIAGNOSIS — I472 Ventricular tachycardia, unspecified: Secondary | ICD-10-CM

## 2017-05-18 NOTE — Patient Instructions (Signed)

## 2017-05-18 NOTE — Progress Notes (Signed)
Subjective:    Patient ID: Dakota Gutierrez is a 66 y.o. male.  HPI     [name], MD, PhD [facility] [address] [place], [place] [address]  Dear Dr. Carlota Raspberry,   I saw your patient, Dakota Gutierrez, upon your kind request in my neurologic clinic today for initial consultation of his sleep disorder, in particular, concern for underlying obstructive sleep apnea. The patient is unaccompanied today. As you know, Dakota Gutierrez is a 66 year old right-handed gentleman with an underlying medical history of hypertension, diabetes, anxiety, reflux disease, hyperlipidemia, ventricular tachycardia with recent repeat status post ICD placement on 04/26/2017 (first one in 2012), and obesity, who reports snoring and excessive daytime somnolence, nonrestorative sleep. I reviewed your office note from 03/30/2017. His Epworth sleepiness score is 6 out of 24 today, fatigue score is 15 out of 63. He does not feel rested in the mornings. He does not always get enough sleep he feels. He is a retired Chief Technology Officer, lives with his wife. He is a nonsmoker. He drinks alcohol in the form of beer, about 4 per week, he no longer drinks caffeine since his diagnosis of V. tach in 2012. He is not aware of any family history of OSA. He has nocturia about twice per average night. He has had rare morning headaches. He had a tonsillectomy as a child, but time around midnight, wakeup time around 7:30 or 7:45 AM. His wife still works. His weight has been stable for the past several years. He has had difficulty staying asleep for the past year. He has woken up with a sense of gasping for air. He has occasional restless leg symptoms in the evenings before going to bed, is not sure if he has leg twitching at night. He does not watch TV in bed. They have 1 cat.  His Past Medical History Is Significant For: Past Medical History:  Diagnosis Date  . 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  Diabetes mellitus without complication ([place])   . Dual implantable cardiac defibrillator in situ    Medtronic [id number] Protecta  . Elevated transaminase level    10/2011  ? amio  . GERD (gastroesophageal reflux disease)   . HTN (hypertension)   . Other primary cardiomyopathies   . Syncope   . Tubular adenoma of colon   . Ventricular tachycardia ([place])    storm following ICD implant on amiodarone     His Past Surgical History Is Significant For: Past Surgical History:  Procedure Laterality Date  . CARDIAC DEFIBRILLATOR PLACEMENT     Medtronic [id number] Protecta  . CHOLECYSTECTOMY    . GALLBLADDER SURGERY    . HERNIA REPAIR    . ICD GENERATOR CHANGEOUT N/A 04/26/2017   Procedure: ICD Generator Changeout;  Surgeon: [name], MD;  Location: [facility];  Service: Cardiovascular;  Laterality: N/A;  . TONSILLECTOMY AND ADENOIDECTOMY      His Family History Is Significant For: Family History  Problem Relation Age of Onset  . Hyperlipidemia Mother   . Heart disease Father   . Colon cancer Neg Hx   . Esophageal cancer Neg Hx   . Stomach cancer Neg Hx   . Rectal cancer Neg Hx     His Social History Is Significant For: Social History   Social History  . Marital status: Married    Spouse name: N/A  . Number of children: N/A  . Years of education: N/A  Occupational History  . Retired Pharmacist, hospital    Social History Main Topics  . Smoking status: Never Smoker  . Smokeless tobacco: Never Used  . Alcohol use 1.2 oz/week    2 Cans of beer per week     Comment: 4/week  . Drug use: No  . Sexual activity: Yes   Other Topics Concern  . None   Social History Narrative   Divorced. Education: [employer]. Exercise: walking 3 times a week for 30 minutes.   Retired Pharmacist, hospital    His Allergies Are:  Allergies  Allergen Reactions  . Adhesive [Tape]     Swelling and welts formed  :   His Current Medications Are:  Outpatient Encounter Prescriptions as of 05/18/2017   Medication Sig  . acetaminophen (TYLENOL) 500 MG tablet Take 1,000 mg by mouth daily as needed for moderate pain or headache.  Marland Kitchen amiodarone (PACERONE) 200 MG tablet TAKE ONE-HALF TABLET BY MOUTH ONCE DAILY  . aspirin EC 81 MG tablet Take 1 tablet (81 mg total) by mouth daily.  Marland Kitchen atorvastatin (LIPITOR) 10 MG tablet Take 1 tablet (10 mg total) by mouth daily.  . Cholecalciferol (VITAMIN D) 2000 UNITS tablet Take 2,000 Units by mouth daily.  . famotidine (PEPCID) 20 MG tablet Take 1 tablet (20 mg total) by mouth 2 (two) times daily as needed. (Patient taking differently: Take 20 mg by mouth 2 (two) times daily as needed for heartburn or indigestion. )  . losartan-hydrochlorothiazide (HYZAAR) 100-12.5 MG tablet Take 1 tablet by mouth daily.  . metFORMIN (GLUCOPHAGE) 500 MG tablet TAKE 1 TABLET ONCE A DAY WITH LARGEST MEAL.  . metoprolol succinate (TOPROL-XL) 100 MG 24 hr tablet Take 1 tablet (100 mg total) by mouth daily. Take with or immediately following a meal.  . Omega-3 Fatty Acids (FISH OIL) 1000 MG CAPS Take 1,000 mg by mouth 2 (two) times daily.   . tadalafil (CIALIS) 20 MG tablet Take 0.5-1 tablets (10-20 mg total) by mouth daily as needed for erectile dysfunction.  . valACYclovir (VALTREX) 1000 MG tablet Take 1 tablet (1,000 mg total) by mouth daily.  Marland Kitchen zoster vaccine live, PF, (ZOSTAVAX) 09470 UNT/0.65ML injection Inject 19,400 Units into the skin once.   No facility-administered encounter medications on file as of 05/18/2017.   :  Review of Systems:  Out of a complete 14 point review of systems, all are reviewed and negative with the exception of these symptoms as listed below: Review of Systems  Neurological:       Pt presents today to discuss his sleep. Pt reports that he does not sleep well. Pt endorses snoring but has never had a sleep study.  Epworth Sleepiness Scale 0= would never doze 1= slight chance of dozing 2= moderate chance of dozing 3= high chance of  dozing  Sitting and reading: 1 Watching TV: 1 Sitting inactive in a public place (ex. Theater or meeting): 0 As a passenger in a car for an hour without a break: 1 Lying down to rest in the afternoon: 3 Sitting and talking to someone: 0 Sitting quietly after lunch (no alcohol): 0 In a car, while stopped in traffic: 0 Total: 6     Objective:  Neurological Exam  Physical Exam Physical Examination:   Vitals:   05/18/17 1346  BP: (!) 152/89  Pulse: (!) 101   General Examination: The patient is a very pleasant 66 y.o. male in no acute distress. He appears well-developed and well-nourished and well groomed.   HEENT: Normocephalic,  atraumatic, pupils are equal, round and reactive to light and accommodation. Extraocular tracking is good without limitation to gaze excursion or nystagmus noted. Normal smooth pursuit is noted. Hearing is grossly intact. Face is symmetric with normal facial animation and normal facial sensation. Speech is clear with no dysarthria noted. There is no hypophonia. There is no lip, neck/head, jaw or voice tremor. Neck is supple with full range of passive and active motion. There are no carotid bruits on auscultation. Oropharynx exam reveals: mild mouth dryness, adequate dental hygiene and moderate airway crowding, due to small airway opening and redundant soft palate, uvula not fully visualized. Mallampati is class III. Tongue protrudes centrally and palate elevates symmetrically. Tonsils are absent. Neck size is 17 7/8 inches. He has a Mild overbite.   Chest: Clear to auscultation without wheezing, rhonchi or crackles noted.  Heart: S1+S2+0, regular and normal without murmurs, rubs or gallops noted.   Abdomen: Soft, non-tender and non-distended with normal bowel sounds appreciated on auscultation.  Extremities: There is no pitting edema in the distal lower extremities bilaterally. Pedal pulses are intact.  Skin: Warm and dry without trophic changes noted. There  are no varicose veins.  Musculoskeletal: exam reveals no obvious joint deformities, tenderness or joint swelling or erythema.   Neurologically:  Mental status: The patient is awake, alert and oriented in all 4 spheres. His immediate and remote memory, attention, language skills and fund of knowledge are appropriate. There is no evidence of aphasia, agnosia, apraxia or anomia. Speech is clear with normal prosody and enunciation. Thought process is linear. Mood is normal and affect is normal.  Cranial nerves II - XII are as described above under HEENT exam. In addition: shoulder shrug is normal with equal shoulder height noted. Motor exam: Normal bulk, strength and tone is noted. There is no drift, tremor or rebound. Romberg is negative. Reflexes are 1+ throughout. Fine motor skills and coordination: intact with normal finger taps, normal hand movements, normal rapid alternating patting, normal foot taps and normal foot agility.  Cerebellar testing: No dysmetria or intention tremor on finger to nose testing. Heel to shin is unremarkable bilaterally. There is no truncal or gait ataxia.  Sensory exam: intact to light touch in the upper and lower extremities.  Gait, station and balance: He stands easily. No veering to one side is noted. No leaning to one side is noted. Posture is age-appropriate and stance is narrow based. Gait shows normal stride length and normal pace. No problems turning are noted. Tandem walk is unremarkable.                Assessment and Plan:  In summary, Dakota Gutierrez is a very pleasant 66 y.o.-year old male with an underlying medical history of hypertension, diabetes, anxiety, reflux disease, hyperlipidemia, ventricular tachycardia with recent repeat status post ICD placement on 04/26/2017 (first one in 2012), and obesity, whose history and physical exam are concerning for obstructive sleep apnea (OSA). He also endorses some symptoms in keeping with mild restless leg  syndrome. I had a long chat with the patient about my findings and the diagnosis of OSA, its prognosis and treatment options. We talked about medical treatments, surgical interventions and non-pharmacological approaches. I explained in particular the risks and ramifications of untreated moderate to severe OSA, especially with respect to developing cardiovascular disease down the Road, including congestive heart failure, difficult to treat hypertension, cardiac arrhythmias, or stroke. Even type 2 diabetes has, in part, been linked to untreated OSA. Symptoms of  untreated OSA include daytime sleepiness, memory problems, mood irritability and mood disorder such as depression and anxiety, lack of energy, as well as recurrent headaches, especially morning headaches. We talked about trying to maintain a healthy lifestyle in general, as well as the importance of weight control. I encouraged the patient to eat healthy, exercise daily and keep well hydrated, to keep a scheduled bedtime and wake time routine, to not skip any meals and eat healthy snacks in between meals. I advised the patient not to drive when feeling sleepy. I recommended the following at this time: sleep study with potential positive airway pressure titration. (We will score hypopneas at 4%).   I explained the sleep test procedure to the patient and also outlined possible surgical and non-surgical treatment options of OSA, including the use of a custom-made dental device (which would require a referral to a specialist dentist or oral surgeon), upper airway surgical options, such as pillar implants, radiofrequency surgery, tongue base surgery, and UPPP (which would involve a referral to an ENT surgeon). Rarely, jaw surgery such as mandibular advancement may be considered.  I also explained the CPAP treatment option to the patient, who indicated that he would be willing to try CPAP if the need arises. I explained the importance of being compliant with PAP  treatment, not only for insurance purposes but primarily to improve His symptoms, and for the patient's long term health benefit, including to reduce His cardiovascular risks. I answered all his questions today and the patient was in agreement. I would like to see him back after the sleep study is completed and encouraged him to call with any interim questions, concerns, problems or updates.   Thank you very much for allowing me to participate in the care of this nice patient. If I can be of any further assistance to you please do not hesitate to call me at [phone].  Sincerely,   [name], MD, PhD

## 2017-06-01 ENCOUNTER — Ambulatory Visit (INDEPENDENT_AMBULATORY_CARE_PROVIDER_SITE_OTHER): Payer: Medicare Other | Admitting: Neurology

## 2017-06-01 DIAGNOSIS — G4733 Obstructive sleep apnea (adult) (pediatric): Secondary | ICD-10-CM | POA: Diagnosis not present

## 2017-06-01 DIAGNOSIS — Z8679 Personal history of other diseases of the circulatory system: Secondary | ICD-10-CM

## 2017-06-01 DIAGNOSIS — Z95810 Presence of automatic (implantable) cardiac defibrillator: Secondary | ICD-10-CM

## 2017-06-01 DIAGNOSIS — G4720 Circadian rhythm sleep disorder, unspecified type: Secondary | ICD-10-CM

## 2017-06-01 DIAGNOSIS — R9431 Abnormal electrocardiogram [ECG] [EKG]: Secondary | ICD-10-CM

## 2017-06-07 ENCOUNTER — Telehealth: Payer: Self-pay

## 2017-06-07 NOTE — Procedures (Signed)
PATIENT'S NAME:  Dakota Gutierrez, Dakota Gutierrez DOB:      11-08-1951      MR#:    [id number]     DATE OF RECORDING: 06/01/2017 REFERRING M.D.:  [name], MD Study Performed:   Baseline Polysomnogram HISTORY: 66 year old man with a history of hypertension, diabetes, anxiety, reflux disease, hyperlipidemia, ventricular tachycardia with recent repeat status post ICD placement on 04/26/2017, and obesity, who reports snoring and excessive daytime somnolence, nonrestorative sleep. The patient endorsed the Epworth Sleepiness Scale at 6 points. The patient's weight 192 pounds with a height of 65 (inches), resulting in a BMI of 32. kg/m2. The patient's neck circumference measured 18 inches.  CURRENT MEDICATIONS: Tylenol, Pacerone, Aspirin, Lipitor, Hyzaar, Metformin, Valtrex, Zostavax   PROCEDURE:  This is a multichannel digital polysomnogram utilizing the Somnostar 11.2 system.  Electrodes and sensors were applied and monitored per AASM Specifications.   EEG, EOG, Chin and Limb EMG, were sampled at 200 Hz.  ECG, Snore and Nasal Pressure, Thermal Airflow, Respiratory Effort, CPAP Flow and Pressure, Oximetry was sampled at 50 Hz. Digital video and audio were recorded.      BASELINE STUDY  Lights Out was at 21:08 and Lights On at 04:54.  Total recording time (TRT) was 467 minutes, with a total sleep time (TST) of  328 minutes.   The patient's sleep latency was delayed.  REM latency was 202.5 minutes, which is markedly delayed. The sleep efficiency was 70.2 %, which is reduced.     SLEEP ARCHITECTURE: WASO (Wake after sleep onset) was 107.5 minutes with moderate sleep fragmentation noted.  There were 25 minutes in Stage N1, 102.5 minutes Stage N2, 124 minutes Stage N3 and 76.5 minutes in Stage REM.  The percentage of Stage N1 was 7.6%, Stage N2 was 31.3%, Stage N3 was 37.8%, which is increased, and Stage R (REM sleep) was 23.3%, which is normal.  The arousals were noted as: 38 were spontaneous, 0 were associated with  PLMs, 27 were associated with respiratory events.    Audio and video analysis did not show any abnormal or unusual movements, behaviors, phonations or vocalizations.  The patient took 2 bathroom breaks. Moderate to loud snoring was noted. The EKG showed frequent PVCs.   RESPIRATORY ANALYSIS:  There were a total of 87 respiratory events:  0 obstructive apneas, 0 central apneas and 0 mixed apneas with a total of 0 apneas and an apnea index (AI) of 0 /hour. There were 87 hypopneas with a hypopnea index of 15.9 /hour. The patient also had 0 respiratory event related arousals (RERAs).      The total APNEA/HYPOPNEA INDEX (AHI) was 15.9/hour and the total RESPIRATORY DISTURBANCE INDEX was 15.9 /hour.  16 events occurred in REM sleep and 142 events in NREM. The REM AHI was 12.5 /hour, versus a non-REM AHI of 16.9. The patient spent 122 minutes of total sleep time in the supine position and 206 minutes in non-supine.. The supine AHI was 35.9 versus a non-supine AHI of 4.1.  OXYGEN SATURATION & C02:  The Wake baseline 02 saturation was 97%, with the lowest being 84%. Time spent below 89% saturation equaled 15 minutes.  PERIODIC LIMB MOVEMENTS:  The patient had a total of 5 Periodic Limb Movements.  The Periodic Limb Movement (PLM) index was .9 and the PLM Arousal index was 0/hour.  Post-study, the patient indicated that sleep was the same as usual.   IMPRESSION:  1. Obstructive Sleep Apnea (OSA) 2. Dysfunctions associated with sleep stages or arousal from sleep  3. Abnormal EKG  RECOMMENDATIONS:  1. This study demonstrates moderate to severe obstructive sleep apnea, with a total AHI of 15.9/hour, REM AHI of 12.5/hour, supine AHI of 35.9/hour and O2 nadir of 84%. Treatment with positive airway pressure in the form of CPAP is recommended. This will require a full night titration study to optimize therapy. Other treatment options may include avoidance of supine sleep position along with weight loss, upper  airway or jaw surgery in selected patients or the use of an oral appliance in certain patients. ENT evaluation and/or consultation with a maxillofacial surgeon or dentist may be feasible in some instances.    2. Please note that untreated obstructive sleep apnea carries additional perioperative morbidity. Patients with significant obstructive sleep apnea should receive perioperative PAP therapy and the surgeons and particularly the anesthesiologist should be informed of the diagnosis and the severity of the sleep disordered breathing. 3. The study showed frequent PVCs on single lead EKG; clinical correlation is recommended. Patient has a known history of Tanna Furry and is s/p ICD placement, follows with cardiology.  4. This study shows sleep fragmentation and abnormal sleep stage percentages; these are nonspecific findings and per se do not signify an intrinsic sleep disorder or a cause for the patient's sleep-related symptoms. Causes include (but are not limited to) the first night effect of the sleep study, circadian rhythm disturbances, medication effect or an underlying mood disorder or medical problem.  5. The patient should be cautioned not to drive, work at heights, or operate dangerous or heavy equipment when tired or sleepy. Review and reiteration of good sleep hygiene measures should be pursued with any patient. 6. The patient will be seen in follow-up by Dr. Rexene Alberts at [facility] for discussion of the test results and further management strategies. The referring provider will be notified of the test results.  I certify that I have reviewed the entire raw data recording prior to the issuance of this report in accordance with the Standards of Accreditation of the American Academy of Sleep Medicine (AASM)  [name], MD, PhD Diplomat, American Board of Psychiatry and Neurology (Neurology and Sleep Medicine)

## 2017-06-07 NOTE — Addendum Note (Signed)
Addended by: [name] on: 06/07/2017 08:36 AM   Modules accepted: Orders

## 2017-06-07 NOTE — Progress Notes (Signed)
Patient referred by Dr. Carlota Raspberry, seen by me on 05/18/17, diagnostic PSG on 06/01/17.    Please call and notify the patient that the recent sleep study did confirm the diagnosis of mod to severe obstructive sleep apnea and that I recommend treatment for this in the form of CPAP. This will require a repeat sleep study for proper titration and mask fitting. Please explain to patient and arrange for a CPAP titration study. I have placed an order in the chart. Thanks, and please route to [facility] for scheduling next sleep study.  [name], MD, PhD [facility])

## 2017-06-07 NOTE — Telephone Encounter (Signed)
I called pt to discuss his sleep study results. No answer, left a message asking him to call me back. 

## 2017-06-07 NOTE — Telephone Encounter (Signed)
-----   Message from [name], MD sent at 06/07/2017  8:36 AM EDT ----- Patient referred by Dr. Carlota Raspberry, seen by me on 05/18/17, diagnostic PSG on 06/01/17.    Please call and notify the patient that the recent sleep study did confirm the diagnosis of mod to severe obstructive sleep apnea and that I recommend treatment for this in the form of CPAP. This will require a repeat sleep study for proper titration and mask fitting. Please explain to patient and arrange for a CPAP titration study. I have placed an order in the chart. Thanks, and please route to [facility] for scheduling next sleep study.  [name], MD, PhD [facility])

## 2017-06-12 NOTE — Telephone Encounter (Signed)
I called pt. I advised pt that Dr. Rexene Alberts reviewed their sleep study results and found that pt has moderate to severe osa that Dr. Rexene Alberts recommends treatment in the form of a cpap. Dr. Rexene Alberts recommends that pt return for a repeat sleep study in order to properly titrate the cpap and ensure a good mask fit. Pt is agreeable to returning for a titration study. I advised pt that our sleep lab will file with pt's insurance and call pt to schedule the sleep study when we hear back from the pt's insurance regarding coverage of this sleep study. Pt verbalized understanding of results. Pt had no questions at this time but was encouraged to call back if questions arise.

## 2017-06-27 ENCOUNTER — Ambulatory Visit (INDEPENDENT_AMBULATORY_CARE_PROVIDER_SITE_OTHER): Payer: Medicare Other | Admitting: Neurology

## 2017-06-27 DIAGNOSIS — G4733 Obstructive sleep apnea (adult) (pediatric): Secondary | ICD-10-CM | POA: Diagnosis not present

## 2017-06-27 DIAGNOSIS — R9431 Abnormal electrocardiogram [ECG] [EKG]: Secondary | ICD-10-CM

## 2017-06-27 DIAGNOSIS — G4720 Circadian rhythm sleep disorder, unspecified type: Secondary | ICD-10-CM

## 2017-06-30 ENCOUNTER — Ambulatory Visit (HOSPITAL_COMMUNITY): Payer: Medicare Other | Admitting: Anesthesiology

## 2017-06-30 ENCOUNTER — Encounter (HOSPITAL_COMMUNITY): Payer: Self-pay | Admitting: *Deleted

## 2017-06-30 ENCOUNTER — Ambulatory Visit (HOSPITAL_COMMUNITY)
Admission: RE | Admit: 2017-06-30 | Discharge: 2017-06-30 | Disposition: A | Discharge status: Home / Self Care | Payer: Medicare Other | Source: Ambulatory Visit | Attending: Gastroenterology | Admitting: Gastroenterology

## 2017-06-30 ENCOUNTER — Encounter (HOSPITAL_COMMUNITY)
Admission: RE | Disposition: A | Discharge status: Home / Self Care | Payer: Self-pay | Source: Ambulatory Visit | Attending: Gastroenterology

## 2017-06-30 DIAGNOSIS — I4891 Unspecified atrial fibrillation: Secondary | ICD-10-CM | POA: Diagnosis not present

## 2017-06-30 DIAGNOSIS — Z79899 Other long term (current) drug therapy: Secondary | ICD-10-CM | POA: Insufficient documentation

## 2017-06-30 DIAGNOSIS — D175 Benign lipomatous neoplasm of intra-abdominal organs: Secondary | ICD-10-CM | POA: Insufficient documentation

## 2017-06-30 DIAGNOSIS — K219 Gastro-esophageal reflux disease without esophagitis: Secondary | ICD-10-CM | POA: Insufficient documentation

## 2017-06-30 DIAGNOSIS — E119 Type 2 diabetes mellitus without complications: Secondary | ICD-10-CM | POA: Diagnosis not present

## 2017-06-30 DIAGNOSIS — F419 Anxiety disorder, unspecified: Secondary | ICD-10-CM | POA: Insufficient documentation

## 2017-06-30 DIAGNOSIS — Z95810 Presence of automatic (implantable) cardiac defibrillator: Secondary | ICD-10-CM | POA: Insufficient documentation

## 2017-06-30 DIAGNOSIS — Z1211 Encounter for screening for malignant neoplasm of colon: Secondary | ICD-10-CM | POA: Diagnosis present

## 2017-06-30 DIAGNOSIS — Z7984 Long term (current) use of oral hypoglycemic drugs: Secondary | ICD-10-CM | POA: Insufficient documentation

## 2017-06-30 DIAGNOSIS — I10 Essential (primary) hypertension: Secondary | ICD-10-CM | POA: Diagnosis not present

## 2017-06-30 HISTORY — PX: COLONOSCOPY WITH PROPOFOL: SHX5780

## 2017-06-30 LAB — GLUCOSE, CAPILLARY: GLUCOSE-CAPILLARY: 132 mg/dL — AB (ref 65–99)

## 2017-06-30 SURGERY — COLONOSCOPY WITH PROPOFOL
Anesthesia: Monitor Anesthesia Care

## 2017-06-30 MED ORDER — LACTATED RINGERS IV SOLN
INTRAVENOUS | Status: DC | PRN
  Administered 2017-06-30: 12:00:00 via INTRAVENOUS

## 2017-06-30 MED ORDER — PROPOFOL 10 MG/ML IV BOLUS
INTRAVENOUS | Status: AC
  Filled 2017-06-30: qty 40

## 2017-06-30 MED ORDER — PROPOFOL 10 MG/ML IV BOLUS
INTRAVENOUS | Status: DC | PRN
  Administered 2017-06-30 (×4): 20 mg via INTRAVENOUS
  Administered 2017-06-30 (×2): 40 mg via INTRAVENOUS
  Administered 2017-06-30 (×3): 20 mg via INTRAVENOUS

## 2017-06-30 SURGICAL SUPPLY — 21 items

## 2017-06-30 NOTE — Op Note (Signed)
[facility] Patient Name: Dakota Gutierrez Procedure Date: 06/30/2017 MRN: [id number] Attending MD: Carol Ada , MD Date of Birth: November 03, 1951 CSN: [id number] Age: 66 Admit Type: Outpatient Procedure:                Colonoscopy Indications:              Screening for colorectal malignant neoplasm Providers:                Carol Ada, MD, Laverta Baltimore RN, RN, Alan Mulder, Technician Referring MD:              Medicines:                Propofol per Anesthesia Complications:            No immediate complications. Estimated Blood Loss:     Estimated blood loss: none. Procedure:                Pre-Anesthesia Assessment:                           - Prior to the procedure, a History and Physical                            was performed, and patient medications and                            allergies were reviewed. The patient's tolerance of                            previous anesthesia was also reviewed. The risks                            and benefits of the procedure and the sedation                            options and risks were discussed with the patient.                            All questions were answered, and informed consent                            was obtained. Prior Anticoagulants: The patient has                            taken no previous anticoagulant or antiplatelet                            agents. ASA Grade Assessment: II - A patient with                            mild systemic disease. After reviewing the risks  and benefits, the patient was deemed in                            satisfactory condition to undergo the procedure.                           - Sedation was administered by an anesthesia                            professional. Deep sedation was attained.                           After obtaining informed consent, the colonoscope                            was passed under  direct vision. Throughout the                            procedure, the patient's blood pressure, pulse, and                            oxygen saturations were monitored continuously. The                            EC-[id number] ([id number]) scope was introduced through                            the anus and advanced to the the cecum, identified                            by appendiceal orifice and ileocecal valve. The                            colonoscopy was performed without difficulty. The                            patient tolerated the procedure well. The quality                            of the bowel preparation was excellent. The                            ileocecal valve, appendiceal orifice, and rectum                            were photographed. Scope In: 12:27:00 PM Scope Out: 12:41:46 PM Scope Withdrawal Time: 0 hours 11 minutes 8 seconds  Total Procedure Duration: 0 hours 14 minutes 46 seconds  Findings:      There was a medium-sized lipoma, in the transverse colon. 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                           - No specimens collected.  Moderate Sedation:      N/A- Per Anesthesia Care Recommendation:           - Patient has a contact number available for                            emergencies. The signs and symptoms of potential                            delayed complications were discussed with the                            patient. Return to normal activities tomorrow.                            Written discharge instructions were provided to the                            patient.                           - Resume previous diet.                           - Continue present medications.                           - Repeat colonoscopy in 10 years for surveillance. Procedure Code(s):        --- Professional ---                           [phone], Colonoscopy, flexible; diagnostic, including                             collection of specimen(s) by brushing or washing,                            when performed (separate procedure) Diagnosis Code(s):        --- Professional ---                           Z12.11, Encounter for screening for malignant                            neoplasm of colon                           D17.5, Benign lipomatous neoplasm of                            intra-abdominal organs CPT copyright 2016 American Medical Association. All rights reserved. The codes documented in this report are preliminary and upon coder review may  be revised to meet current compliance requirements. Carol Ada, MD Carol Ada, MD 06/30/2017 12:50:34 PM This report has been signed electronically. Number of Addenda: 0

## 2017-06-30 NOTE — Anesthesia Preprocedure Evaluation (Signed)
Anesthesia Evaluation  Patient identified by MRN, date of birth, ID band Patient awake    Reviewed: Allergy & Precautions, NPO status , Patient's Chart, lab work & pertinent test results  Airway Mallampati: II  TM Distance: >3 FB Neck ROM: Full    Dental no notable dental hx.    Pulmonary neg pulmonary ROS,    Pulmonary exam normal breath sounds clear to auscultation       Cardiovascular hypertension, Pt. on medications Normal cardiovascular exam+ dysrhythmias Ventricular Tachycardia + Cardiac Defibrillator  Rhythm:Regular Rate:Normal     Neuro/Psych negative neurological ROS  negative psych ROS   GI/Hepatic negative GI ROS, Neg liver ROS,   Endo/Other  diabetes, Type 2, Oral Hypoglycemic Agents  Renal/GU negative Renal ROS  negative genitourinary   Musculoskeletal negative musculoskeletal ROS (+)   Abdominal   Peds negative pediatric ROS (+)  Hematology negative hematology ROS (+)   Anesthesia Other Findings   Reproductive/Obstetrics negative OB ROS                             Anesthesia Physical Anesthesia Plan  ASA: III  Anesthesia Plan: MAC   Post-op Pain Management:    Induction:   PONV Risk Score and Plan:   Airway Management Planned: Simple Face Mask  Additional Equipment:   Intra-op Plan:   Post-operative Plan:   Informed Consent: I have reviewed the patients History and Physical, chart, labs and discussed the procedure including the risks, benefits and alternatives for the proposed anesthesia with the patient or authorized representative who has indicated his/her understanding and acceptance.   Dental advisory given  Plan Discussed with:   Anesthesia Plan Comments:         Anesthesia Quick Evaluation

## 2017-06-30 NOTE — Anesthesia Postprocedure Evaluation (Signed)
Anesthesia Post Note  Patient: Dakota Gutierrez  Procedure(s) Performed: Procedure(s) (LRB): COLONOSCOPY WITH PROPOFOL (N/A)     Patient location during evaluation: PACU Anesthesia Type: MAC Level of consciousness: awake and alert Pain management: pain level controlled Vital Signs Assessment: post-procedure vital signs reviewed and stable Respiratory status: spontaneous breathing, nonlabored ventilation, respiratory function stable and patient connected to nasal cannula oxygen Cardiovascular status: stable and blood pressure returned to baseline Anesthetic complications: no    Last Vitals:  Vitals:   06/30/17 1305 06/30/17 1310  BP:  119/67  Pulse: 76 75  Resp: 20 (!) 23  Temp:      Last Pain:  Vitals:   06/30/17 1245  TempSrc: Oral                 Montez Hageman

## 2017-06-30 NOTE — Transfer of Care (Signed)
Immediate Anesthesia Transfer of Care Note  Patient: Dakota Gutierrez  Procedure(s) Performed: Procedure(s): COLONOSCOPY WITH PROPOFOL (N/A)  Patient Location: PACU  Anesthesia Type:MAC  Level of Consciousness: awake, alert  and oriented  Airway & Oxygen Therapy: Patient Spontanous Breathing and Patient connected to nasal cannula oxygen  Post-op Assessment: Report given to RN and Post -op Vital signs reviewed and stable  Post vital signs: Reviewed and stable  Last Vitals:  Vitals:   06/30/17 1200  BP: 121/62  Pulse: 81  Resp: 18  Temp: 36.7 C    Last Pain:  Vitals:   06/30/17 1200  TempSrc: Oral         Complications: No apparent anesthesia complications

## 2017-06-30 NOTE — H&P (Signed)
Dakota Gutierrez HPI: At this time the patient denies any problems with nausea, vomiting, fevers, chills, abdominal pain, constipation, melena, GERD, or dysphagia. The patient denies any known family history of colon cancers. No complaints of chest pain, SOB, MI, or sleep apnea. The patient underwent a colonoscopy with Dr. Hilarie Fredrickson 04/26/2012 with findings of TI erosions and a 4 mm adenoma in the descending colon. The TI biopsies were read as normal. He has atrial fibrillation and it was diagnosed in 2012, but he is rate controlled with amiodarone. The patient only uses ASA. He does complain about post prandial diarrhea and he will also have a sense of incomplete evacuation. There is a tenesmus quality to his symptoms. Intermittently, but not very often, he will have hematcohezia. He used an over-the-counter antidiarrheal with benefit after his diarrheal bowel movement, i.e., it helped to control his subsequent bowel movements.  Past Medical History:  Diagnosis Date  . 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 Diabetes mellitus without complication ([place])   . Dual implantable cardiac defibrillator in situ    Medtronic [id number] Protecta  . Elevated transaminase level    10/2011  ? amio  . GERD (gastroesophageal reflux disease)   . HTN (hypertension)   . Other primary cardiomyopathies   . Syncope   . Tubular adenoma of colon   . Ventricular tachycardia ([place])    storm following ICD implant on amiodarone     Past Surgical History:  Procedure Laterality Date  . CARDIAC DEFIBRILLATOR PLACEMENT     Medtronic [id number] Protecta  . CHOLECYSTECTOMY    . GALLBLADDER SURGERY    . HERNIA REPAIR    . ICD GENERATOR CHANGEOUT N/A 04/26/2017   Procedure: ICD Generator Changeout;  Surgeon: [name], MD;  Location: [facility];  Service: Cardiovascular;  Laterality: N/A;  . TONSILLECTOMY AND ADENOIDECTOMY      Family History  Problem Relation Age of Onset  . Hyperlipidemia Mother   . Heart  disease Father   . Colon cancer Neg Hx   . Esophageal cancer Neg Hx   . Stomach cancer Neg Hx   . Rectal cancer Neg Hx     Social History:  reports that he has never smoked. He has never used smokeless tobacco. He reports that he drinks about 1.2 oz of alcohol per week . He reports that he does not use drugs.  Allergies:  Allergies  Allergen Reactions  . Adhesive [Tape]     Swelling and welts formed    Medications: Scheduled: Continuous:  No results found for this or any previous visit (from the past 24 hour(s)).   No results found.  ROS:  As stated above in the HPI otherwise negative.  There were no vitals taken for this visit.    PE: Gen: NAD, Alert and Oriented HEENT:  [place]/AT, EOMI Neck: Supple, no LAD Lungs: CTA Bilaterally CV: RRR without M/G/R ABM: Soft, NTND, +BS Ext: No C/C/E  Assessment/Plan: 1) Screening colonoscopy.  Ronnesha Mester D 06/30/2017, 11:54 AM

## 2017-06-30 NOTE — Discharge Instructions (Signed)

## 2017-07-03 NOTE — Progress Notes (Signed)
Patient referred by Dr. Carlota Raspberry, seen by me on 05/18/17, diagnostic PSG on 06/01/17, CPAP study on 06/27/17.     Please call and inform patient that I have entered an order for treatment with positive airway pressure (PAP) treatment of obstructive sleep apnea (OSA). He did well during the latest sleep study with CPAP. We will, therefore, arrange for a machine for home use through a DME (durable medical equipment) company of His choice; and I will see the patient back in follow-up in about 10 weeks; may need to see Jinny Blossom or Hoyle Sauer for timing. Please also explain to the patient that I will be looking out for compliance data, which can be downloaded from the machine (stored on an SD card, that is inserted in the machine) or via remote access through a modem, that is built into the machine. At the time of the followup appointment we will discuss sleep study results and how it is going with PAP treatment at home. Please advise patient to bring His machine at the time of the first FU visit, even though this is cumbersome. Bringing the machine for every visit after that will likely not be needed, but often helps for the first visit to troubleshoot if needed. Please re-enforce the importance of compliance with treatment and the need for [name] to monitor compliance data - often an insurance requirement and actually good feedback for the patient as far as how they are doing.  Also remind patient, that any interim PAP machine or mask issues should be first addressed with the DME company, as they can often help better with technical and mask fit issues. Please ask if patient has a preference regarding DME company.  Please also make sure, the patient has a follow-up appointment with me or NP in about 10 weeks from the setup date, thanks.  Once you have spoken to the patient - and faxed/routed report to PCP and referring MD (if other than PCP), you can close this encounter, thanks,   [name], MD, PhD [facility] ([place])

## 2017-07-03 NOTE — Procedures (Signed)
PATIENT'S NAME:  Dakota Gutierrez, Dakota Gutierrez DOB:      July 11, 1951      MR#:    [id number]     DATE OF RECORDING: 06/27/2017 REFERRING M.D.:  [name], MD Study Performed:   CPAP  Titration HISTORY: 66 year old man with a history of hypertension, diabetes, anxiety, reflux disease, hyperlipidemia, ventricular tachycardia with recent repeat status post ICD placement on 04/26/2017, and obesity, who presents for a full night CPAP titration. His baseline sleep study on 06/01/17 showed a total AHI of 15.9/hour, REM AHI of 12.5/hour, supine AHI of 35.9/hour and O2 nadir of 84%. Epworth Sleepiness Score of 6 points. The patient's weight 192 pounds with a height of 65 (inches), resulting in a BMI of 32. kg/m2. The patient's neck circumference measured 18 inches.  CURRENT MEDICATIONS: Tylenol, Pacerone, Aspirin, Lipitor, Hyzaar, Metformin, Valtrex, Zostavax  PROCEDURE:  This is a multichannel digital polysomnogram utilizing the SomnoStar 11.2 system.  Electrodes and sensors were applied and monitored per AASM Specifications.   EEG, EOG, Chin and Limb EMG, were sampled at 200 Hz.  ECG, Snore and Nasal Pressure, Thermal Airflow, Respiratory Effort, CPAP Flow and Pressure, Oximetry was sampled at 50 Hz. Digital video and audio were recorded.      The patient was fitted with small nasal pillows. CPAP was initiated at 6 cmH20 with heated humidity per AASM standards and pressure was advanced to 9 cmH20 because of hypopneas, apneas and desaturations.  At a PAP pressure of 9 cmH20, there was a reduction of the AHI to 3.2 with supine REM sleep achieved and O2 nadir of 91%.   Lights Out was at 21:23 and Lights On at 05:35. Total recording time (TRT) was 492.5 minutes, with a total sleep time (TST) of 435 minutes. The patient's sleep latency was 9.5 minutes. REM latency was 60.5 minutes, which is mildly reduced. The sleep efficiency was 88.3 %.    SLEEP ARCHITECTURE: WASO (Wake after sleep onset) was 48 minutes with mild sleep  fragmentation noted and one longer period of wakefulness. There were 6 minutes in Stage N1, 261 minutes Stage N2, 109.5 minutes Stage N3 and 58.5 minutes in Stage REM.  The percentage of Stage N1 was 1.4%, Stage N2 was 60.%, which is mildly increased, Stage N3 was 25.2%, which is increased, and Stage R (REM sleep) was 13.4%, which is reduced.  The arousals were noted as: 29 were spontaneous, 0 were associated with PLMs, 27 were associated with respiratory events.  Audio and video analysis did not show any abnormal or unusual movements, behaviors, phonations or vocalizations. The patient took 1 bathroom break. EKG showed occasional PACs and PVCs and ICD artifact.   RESPIRATORY ANALYSIS:  There was a total of 28 respiratory events: 18 obstructive apneas, 0 central apneas and 0 mixed apneas with a total of 18 apneas and an apnea index (AI) of 2.5 /hour. There were 10 hypopneas with a hypopnea index of 1.4/hour. The patient also had 0 respiratory event related arousals (RERAs).      The total APNEA/HYPOPNEA INDEX  (AHI) was 3.9 /hour and the total RESPIRATORY DISTURBANCE INDEX was 3.9 .hour  1 events occurred in REM sleep and 27 events in NREM. The REM AHI was 1. /hour versus a non-REM AHI of 4.3 /hour.  The patient spent 111.5 minutes of total sleep time in the supine position and 324 minutes in non-supine. The supine AHI was 14.5, versus a non-supine AHI of 0.2.  OXYGEN SATURATION & C02:  The baseline 02 saturation was  96%, with the lowest being 87%. Time spent below 89% saturation equaled 4 minutes.  PERIODIC LIMB MOVEMENTS: The patient had a total of 87 Periodic Limb Movements. The Periodic Limb Movement (PLM) index was 12. and the PLM Arousal index was 0 /hour.  Post-study, the patient indicated that sleep was better than usual.   IMPRESSION: 1. Obstructive Sleep Apnea (OSA) 2. Dysfunctions associated with sleep stages or arousal from sleep 3. Abnormal EKG   RECOMMENDATIONS: 1. This study  demonstrates near-resolution of the patient's obstructive sleep apnea with CPAP therapy. I will, therefore, start the patient on home CPAP treatment at a pressure of 9 cm via small nasal pillows with heated humidity. The patient should be reminded to be fully compliant with PAP therapy to improve sleep related symptoms and decrease long term cardiovascular risks. The patient should be reminded, that it may take up to 3 months to get fully used to using PAP with all planned sleep. The earlier full compliance is achieved, the better long term compliance tends to be. Please note that untreated obstructive sleep apnea carries additional perioperative morbidity. Patients with significant obstructive sleep apnea should receive perioperative PAP therapy and the surgeons and particularly the anesthesiologist should be informed of the diagnosis and the severity of the sleep disordered breathing. 2. The study showed occasional PVCs and PACs on single lead EKG; clinical correlation is recommended. Patient has a known history of Tanna Furry and is s/p ICD placement, follows with cardiology.  3. This study shows sleep fragmentation and mildly abnormal sleep stage percentages; these are nonspecific findings and per se do not signify an intrinsic sleep disorder or a cause for the patient's sleep-related symptoms. Causes include (but are not limited to) the first night effect of the sleep study, circadian rhythm disturbances, medication effect or an underlying mood disorder or medical problem.  4. The patient should be cautioned not to drive, work at heights, or operate dangerous or heavy equipment when tired or sleepy. Review and reiteration of good sleep hygiene measures should be pursued with any patient. 5. The patient will be seen in follow-up by Dr. Rexene Alberts at [facility] for discussion of the test results and further management strategies. The referring provider will be notified of the test results.   I certify that I have reviewed  the entire raw data recording prior to the issuance of this report in accordance with the Standards of Accreditation of the American Academy of Sleep Medicine (AASM)   [name], MD, PhD Diplomat, American Board of Psychiatry and Neurology (Neurology and Sleep Medicine)

## 2017-07-03 NOTE — Addendum Note (Signed)
Addended by: [name] on: 07/03/2017 08:59 AM   Modules accepted: Orders

## 2017-07-04 ENCOUNTER — Telehealth: Payer: Self-pay

## 2017-07-04 NOTE — Telephone Encounter (Signed)
I called pt. I advised pt that Dr. Rexene Alberts reviewed their sleep study results and found that pt did well with the cpap during his latest sleep study. Dr. Rexene Alberts recommends that pt start a cpap at home. I reviewed PAP compliance expectations with the pt. Pt is agreeable to starting a CPAP. I advised pt that an order will be sent to a DME, [facility], and [facility] will call the pt within about one week after they file with the pt's insurance. [facility] will show the pt how to use the machine, fit for masks, and troubleshoot the CPAP if needed. A follow up appt was made for insurance purposes with Dr. Rexene Alberts on 09/05/2017 at 11:30am. Pt verbalized understanding to arrive 15 minutes early and bring their CPAP. A letter with all of this information in it will be sent to the pt's mychart account as a reminder. I verified with the pt that the address we have on file is correct. Pt verbalized understanding of results. Pt had no questions at this time but was encouraged to call back if questions arise.

## 2017-07-04 NOTE — Telephone Encounter (Signed)
-----   Message from Dakota Age, MD sent at 07/03/2017  8:59 AM EDT ----- Patient referred by Dr. Carlota Raspberry, seen by me on 05/18/17, diagnostic PSG on 06/01/17, CPAP study on 06/27/17.     Please call and inform patient that I have entered an order for treatment with positive airway pressure (PAP) treatment of obstructive sleep apnea (OSA). He did well during the latest sleep study with CPAP. We will, therefore, arrange for a machine for home use through a DME (durable medical equipment) company of His choice; and I will see the patient back in follow-up in about 10 weeks; may need to see Jinny Blossom or Hoyle Sauer for timing. Please also explain to the patient that I will be looking out for compliance data, which can be downloaded from the machine (stored on an SD card, that is inserted in the machine) or via remote access through a modem, that is built into the machine. At the time of the followup appointment we will discuss sleep study results and how it is going with PAP treatment at home. Please advise patient to bring His machine at the time of the first FU visit, even though this is cumbersome. Bringing the machine for every visit after that will likely not be needed, but often helps for the first visit to troubleshoot if needed. Please re-enforce the importance of compliance with treatment and the need for [name] to monitor compliance data - often an insurance requirement and actually good feedback for the patient as far as how they are doing.  Also remind patient, that any interim PAP machine or mask issues should be first addressed with the DME company, as they can often help better with technical and mask fit issues. Please ask if patient has a preference regarding DME company.  Please also make sure, the patient has a follow-up appointment with me or NP in about 10 weeks from the setup date, thanks.  Once you have spoken to the patient - and faxed/routed report to PCP and referring MD (if other than PCP), you can close  this encounter, thanks,   Dakota Age, MD, PhD [facility] ([place])

## 2017-07-19 ENCOUNTER — Encounter: Payer: Self-pay | Admitting: Internal Medicine

## 2017-07-25 ENCOUNTER — Other Ambulatory Visit: Payer: Self-pay | Admitting: Internal Medicine

## 2017-08-09 ENCOUNTER — Encounter: Payer: Self-pay | Admitting: Internal Medicine

## 2017-08-09 ENCOUNTER — Ambulatory Visit (INDEPENDENT_AMBULATORY_CARE_PROVIDER_SITE_OTHER): Payer: Medicare Other | Admitting: Internal Medicine

## 2017-08-09 VITALS — BP 132/86 | HR 98 | Ht 65.0 in | Wt 198.0 lb

## 2017-08-09 DIAGNOSIS — I10 Essential (primary) hypertension: Secondary | ICD-10-CM

## 2017-08-09 DIAGNOSIS — Z95810 Presence of automatic (implantable) cardiac defibrillator: Secondary | ICD-10-CM | POA: Diagnosis not present

## 2017-08-09 DIAGNOSIS — Z79899 Other long term (current) drug therapy: Secondary | ICD-10-CM | POA: Diagnosis not present

## 2017-08-09 DIAGNOSIS — I495 Sick sinus syndrome: Secondary | ICD-10-CM

## 2017-08-09 DIAGNOSIS — I472 Ventricular tachycardia, unspecified: Secondary | ICD-10-CM

## 2017-08-09 DIAGNOSIS — I420 Dilated cardiomyopathy: Secondary | ICD-10-CM

## 2017-08-09 MED ORDER — CEPHALEXIN 500 MG PO CAPS: 500.0000 mg | ORAL_CAPSULE | Freq: Two times a day (BID) | ORAL | 0 refills | Status: DC

## 2017-08-09 NOTE — Patient Instructions (Signed)
Medication Instructions: - Your physician has recommended you make the following change in your medication:  1) Start Keflex 500 mg- take 1 capsule by mouth twice daily x 7 days  Labwork: - none ordered  Procedures/Testing: - none ordered  Follow-Up: - Remote monitoring is used to monitor your Pacemaker of ICD from home. This monitoring reduces the number of office visits required to check your device to one time per year. It allows [name] to keep an eye on the functioning of your device to ensure it is working properly. You are scheduled for a device check from home on 11/08/17. You may send your transmission at any time that day. If you have a wireless device, the transmission will be sent automatically. After your physician reviews your transmission, you will receive a postcard with your next transmission date.  - Your physician wants you to follow-up in: January 2019 with Tommye Standard, PA for Dr. Caryl Comes. You will receive a reminder letter in the mail two months in advance. If you don't receive a letter, please call our office to schedule the follow-up appointment.   Any Additional Special Instructions Will Be Listed Below (If Applicable).     If you need a refill on your cardiac medications before your next appointment, please call your pharmacy.

## 2017-08-09 NOTE — Progress Notes (Signed)
Thank you very much yet      Patient Care Team: Wendie Agreste, MD as PCP - General (Family Medicine) [name], MD (Cardiology)   HPI  Dakota Gutierrez is a 66 y.o. 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 Unfortunately, in the days that followed he developed VT storm with multiple shocks and was treated with amiodarone which has been gradually down titrated to 100 mg a day.  Amiodarone surveillance had demonstrated elevated liver function tests spring 2013 . We elected to discontinue the amiodarone albeit with some reluctance on the part of the family. When seen in June of recurrent episodes of ventricular tachycardia 2 of which required therapy. He was then resumed on amiodarone and referred for consultation with GI  most recently 4/17 transaminases were normal.     He has hx   intercurrent pace terminated ventricular tachycardia.  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  There have been no palpitations, lightheadedness or syncope.   He has awakened at night following device generator replacement sensation that he has been running a flight. He has undergone interval sleep test was started on CPAP couple weeks ago. There is no significant change in this morning sensation. It was notedby S4 at sleep mode activated post generator replacement.  He has noted no problems ithwith his incision.     Date          TSH  ALT   5/18           3.84 25             Myoview 2012-no ischemia ejection fraction 56% Echocardiogram 5/17 EF 45-50%  Past Medical History:  Diagnosis Date  . 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 Diabetes mellitus without complication ([place])   . Dual  implantable cardiac defibrillator in situ    Medtronic D314DRG Protecta  . Elevated transaminase level    10/2011  ? amio  . GERD (gastroesophageal reflux disease)   . HTN (hypertension)   . Other primary cardiomyopathies   . Syncope   . Tubular adenoma of colon   . Ventricular tachycardia ([name])    storm following ICD implant on amiodarone     Past Surgical History:  Procedure Laterality Date  . CARDIAC DEFIBRILLATOR PLACEMENT     Medtronic D314DRG Protecta  . CHOLECYSTECTOMY    . COLONOSCOPY WITH PROPOFOL N/A 06/30/2017   Procedure: COLONOSCOPY WITH PROPOFOL;  Surgeon: Carol Ada, MD;  Location: [facility];  Service: Endoscopy;  Laterality: N/A;  . GALLBLADDER SURGERY    . HERNIA REPAIR    . ICD GENERATOR CHANGEOUT N/A 04/26/2017   Procedure: ICD Generator Changeout;  Surgeon: [name], MD;  Location: [facility];  Service: Cardiovascular;  Laterality: N/A;  . TONSILLECTOMY AND ADENOIDECTOMY      Current Outpatient Prescriptions  Medication Sig Dispense Refill  . acetaminophen (TYLENOL) 500 MG tablet Take 1,000 mg by mouth daily as needed for moderate pain or headache.    Marland Kitchen amiodarone (PACERONE) 200 MG tablet TAKE ONE-HALF TABLET BY MOUTH ONCE DAILY 45 tablet 3  . aspirin EC 81 MG tablet Take 1 tablet (81 mg total) by mouth daily.    Marland Kitchen  atorvastatin (LIPITOR) 10 MG tablet Take 1 tablet (10 mg total) by mouth daily. 90 tablet 1  . Cholecalciferol (VITAMIN D) 2000 UNITS tablet Take 2,000 Units by mouth daily.    . famotidine (PEPCID) 20 MG tablet Take 1 tablet (20 mg total) by mouth 2 (two) times daily as needed. (Patient taking differently: Take 20 mg by mouth daily as needed for heartburn or indigestion. ) 60 tablet 4  . losartan-hydrochlorothiazide (HYZAAR) 100-12.5 MG tablet Take 1 tablet by mouth daily. 90 tablet 1  . metFORMIN (GLUCOPHAGE) 500 MG tablet TAKE 1 TABLET ONCE A DAY WITH LARGEST MEAL. 90 tablet 2  . metoprolol succinate (TOPROL-XL) 100 MG 24 hr  tablet Take 1 tablet (100 mg total) by mouth daily. Take with or immediately following a meal. 90 tablet 2  . Omega-3 Fatty Acids (FISH OIL) 1000 MG CAPS Take 1,000 mg by mouth 2 (two) times daily.     . tadalafil (CIALIS) 20 MG tablet Take 0.5-1 tablets (10-20 mg total) by mouth daily as needed for erectile dysfunction. 10 tablet 11  . valACYclovir (VALTREX) 1000 MG tablet Take 1 tablet (1,000 mg total) by mouth daily. (Patient taking differently: Take 1,000 mg by mouth daily as needed (fever blisters). ) 30 tablet 11   No current facility-administered medications for this visit.     Allergies  Allergen Reactions  . Adhesive [Tape]     Swelling and welts formed    Review of Systems negative except from HPI and PMH except for complaints of back pain and muscle pain  Physical Exam BP 132/86   Pulse 98   Ht 5\' 5"  (1.651 m)   Wt 198 lb (89.8 kg)   SpO2 98%   BMI 32.95 kg/m  Well developed and nourished in no acute distress HENT normal Neck supple with JVP-flat Clear Pustule on the medial aspect of the incision Regular rate and rhythm, 2/6  Murmur Abd-soft with active BS No Clubbing cyanosis edema Skin-warm and dry A & Oriented  Grossly normal sensory and motor function  ECG Atrial pacing 92 21/11/35    Assessment and  Plan  VT Recurrent treated Ventricular tachycardia --ATPX2    ICD Medtronic  The patient's device was interrogated.  The information was reviewed. No changes were made in the programming.    Sinus noded dysfunction   Amiodarone rx   Incisional abscess   Euvolemic continue current meds  No interval VT  Tolerating amiodarone. We'll recheck surveillance laboratories in 3 months  I explored the incisional pustule. There may have been a small remnant of suture. Explored bluntly this seems to be some depth to it; I'm a little bit concerned that this represents a draining sinus from his device. We will treat him with topical and oral antibiotics recheck  him in 2 weeks. I have reviewed with him the possibility this could represent device infection.     We have reviewed the benefits and risks of generator replacement.  These include but are not limited to lead fracture and infection.  The patient understands, agrees and is willing to proceed.

## 2017-08-17 LAB — CUP PACEART INCLINIC DEVICE CHECK
Brady Statistic AP VP Percent: 17.81 %
Brady Statistic AP VS Percent: 80.89 %
Brady Statistic AS VP Percent: 0 %
Brady Statistic AS VS Percent: 1.3 %
Brady Statistic RA Percent Paced: 95.35 %
HighPow Impedance: 37 Ohm
HighPow Impedance: 45 Ohm
Implantable Lead Implant Date: 20120531
Implantable Lead Location: 753860
Implantable Lead Model: 6947
Lead Channel Impedance Value: 304 Ohm
Lead Channel Pacing Threshold Amplitude: 0.5 V
Lead Channel Pacing Threshold Amplitude: 0.625 V
Lead Channel Sensing Intrinsic Amplitude: 1.75 mV
Lead Channel Setting Pacing Amplitude: 2 V
Lead Channel Setting Sensing Sensitivity: 0.3 mV
MDC IDC LEAD IMPLANT DT: 20120531
MDC IDC LEAD LOCATION: 753859
MDC IDC MSMT BATTERY REMAINING LONGEVITY: 107 mo
MDC IDC MSMT BATTERY VOLTAGE: 3.03 V
MDC IDC MSMT LEADCHNL RA IMPEDANCE VALUE: 418 Ohm
MDC IDC MSMT LEADCHNL RA PACING THRESHOLD PULSEWIDTH: 0.4 ms
MDC IDC MSMT LEADCHNL RA SENSING INTR AMPL: 2 mV
MDC IDC MSMT LEADCHNL RV IMPEDANCE VALUE: 399 Ohm
MDC IDC MSMT LEADCHNL RV PACING THRESHOLD PULSEWIDTH: 0.4 ms
MDC IDC MSMT LEADCHNL RV SENSING INTR AMPL: 10.75 mV
MDC IDC MSMT LEADCHNL RV SENSING INTR AMPL: 11.375 mV
MDC IDC PG IMPLANT DT: 20180530
MDC IDC SESS DTM: 20180912162056
MDC IDC SET LEADCHNL RA PACING AMPLITUDE: 1.5 V
MDC IDC SET LEADCHNL RV PACING PULSEWIDTH: 0.4 ms
MDC IDC STAT BRADY RV PERCENT PACED: 15.88 %

## 2017-08-24 ENCOUNTER — Ambulatory Visit: Payer: Medicare Other | Admitting: *Deleted

## 2017-09-05 ENCOUNTER — Ambulatory Visit: Payer: Medicare Other | Admitting: Neurology

## 2017-09-21 ENCOUNTER — Other Ambulatory Visit: Payer: Self-pay | Admitting: Family Medicine

## 2017-09-21 ENCOUNTER — Telehealth: Payer: Self-pay

## 2017-09-21 DIAGNOSIS — I10 Essential (primary) hypertension: Secondary | ICD-10-CM

## 2017-09-21 NOTE — Telephone Encounter (Signed)
Called pt to schedule Medicare Annual Wellness Visit. -nr  

## 2017-09-23 ENCOUNTER — Encounter: Payer: Self-pay | Admitting: Neurology

## 2017-09-26 ENCOUNTER — Ambulatory Visit (INDEPENDENT_AMBULATORY_CARE_PROVIDER_SITE_OTHER): Payer: Medicare Other | Admitting: Neurology

## 2017-09-26 ENCOUNTER — Encounter: Payer: Self-pay | Admitting: Neurology

## 2017-09-26 VITALS — BP 134/79 | HR 110 | Ht 65.0 in | Wt 199.0 lb

## 2017-09-26 DIAGNOSIS — G4733 Obstructive sleep apnea (adult) (pediatric): Secondary | ICD-10-CM | POA: Diagnosis not present

## 2017-09-26 DIAGNOSIS — Z9989 Dependence on other enabling machines and devices: Secondary | ICD-10-CM
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
## 2017-09-26 NOTE — Patient Instructions (Addendum)
Keep up the good work! We will see you back in 6 months for sleep apnea check up.   Please continue using your CPAP regularly. While your insurance requires that you use CPAP at least 4 hours each night on 70% of the nights, I recommend, that you not skip any nights and use it throughout the night if you can. Getting used to CPAP and staying with the treatment long term does take time and patience and discipline. Untreated obstructive sleep apnea when it is moderate to severe can have an adverse impact on cardiovascular health and raise her risk for heart disease, arrhythmias, hypertension, congestive heart failure, stroke and diabetes. Untreated obstructive sleep apnea causes sleep disruption, nonrestorative sleep, and sleep deprivation. This can have an impact on your day to day functioning and cause daytime sleepiness and impairment of cognitive function, memory loss, mood disturbance, and problems focussing. Using CPAP regularly can improve these symptoms.  Continue to work on weight loss.

## 2017-10-01 ENCOUNTER — Other Ambulatory Visit: Payer: Self-pay | Admitting: Family Medicine

## 2017-10-12 ENCOUNTER — Encounter: Payer: Self-pay | Admitting: Family Medicine

## 2017-10-12 ENCOUNTER — Other Ambulatory Visit: Payer: Self-pay

## 2017-10-12 ENCOUNTER — Ambulatory Visit: Payer: Medicare Other | Admitting: Family Medicine

## 2017-10-12 VITALS — BP 126/78 | HR 102 | Temp 99.2°F | Resp 16 | Ht 65.0 in | Wt 197.8 lb

## 2017-10-12 DIAGNOSIS — I10 Essential (primary) hypertension: Secondary | ICD-10-CM

## 2017-10-12 DIAGNOSIS — I472 Ventricular tachycardia, unspecified: Secondary | ICD-10-CM

## 2017-10-12 DIAGNOSIS — Z23 Encounter for immunization: Secondary | ICD-10-CM

## 2017-10-12 DIAGNOSIS — E119 Type 2 diabetes mellitus without complications: Secondary | ICD-10-CM | POA: Diagnosis not present

## 2017-10-12 DIAGNOSIS — E785 Hyperlipidemia, unspecified: Secondary | ICD-10-CM | POA: Diagnosis not present

## 2017-10-12 DIAGNOSIS — N529 Male erectile dysfunction, unspecified: Secondary | ICD-10-CM

## 2017-10-12 MED ORDER — LOSARTAN POTASSIUM-HCTZ 100-12.5 MG PO TABS: 1.0000 | ORAL_TABLET | Freq: Every day | ORAL | 2 refills | Status: DC

## 2017-10-12 MED ORDER — ATORVASTATIN CALCIUM 10 MG PO TABS: 10.0000 mg | ORAL_TABLET | Freq: Every day | ORAL | 2 refills | Status: DC

## 2017-10-12 MED ORDER — METFORMIN HCL 500 MG PO TABS: ORAL_TABLET | ORAL | 2 refills | Status: DC

## 2017-10-12 NOTE — Progress Notes (Signed)
Subjective:  By signing my name below, I, Essence Howell, attest that this documentation has been prepared under the direction and in the presence of Wendie Agreste, MD Electronically Signed: Ladene Artist, ED Scribe 10/12/2017 at 2:33 PM.   Patient ID: Dakota Gutierrez, male    DOB: [birth year], 66 y.o.   MRN: [id number]  Chief Complaint  Patient presents with  . Diabetes    6 month follow up, patient would like flu vaccine   HPI Dakota JOURNEY "TOM" is a 66 y.o. male who presents to Primary Care at [facility] for a follow-up.  DM Last seen 5/3. Was on Metformin 500 mg qd. Pt states that diarrhea has improved since he was given suggestions by Dr. Benson Norway. Sees dentist every 6 months and last seen optho 01/26/17.  Lab Results  Component Value Date   HGBA1C 6.6 (H) 03/30/2017   Lab Results  Component Value Date   MICROALBUR 0.7 09/15/2016   Immunizations  Immunization History  Administered Date(s) Administered  . Influenza,inj,Quad PF,6+ Mos 09/19/2013, 10/28/2014, 08/17/2015, 09/15/2016  . Pneumococcal Conjugate-13 10/28/2014  . Pneumococcal Polysaccharide-23 12/22/2016  . Tdap 03/21/2013  . Zoster 12/23/2013  Flu: today  HTN Slightly elevated last visit. May have been related to recent meal. Continued on Toprol 100 mg qd, Hyzaar 100-12.5 mg qd. Denies cp, sob, light-headedness, dizziness, HA, blood in stools, melena, gum bleeding. Lab Results  Component Value Date   CREATININE 1.09 04/26/2017   Hyperlipidemia Lab Results  Component Value Date   CHOL 119 03/30/2017   HDL 33 (L) 03/30/2017   LDLCALC 34 03/30/2017   LDLDIRECT 90.5 08/06/2013   TRIG 258 (H) 03/30/2017   CHOLHDL 3.6 03/30/2017   Lab Results  Component Value Date   ALT 25 03/30/2017   AST 28 03/30/2017   ALKPHOS 44 03/30/2017   BILITOT 0.5 03/30/2017  Lipitor 10 mg qd. Pt ran out of Lipitor a few days ago.   OSA on CPAP Seen by specialist 10/30. Moderate sleep apnea with titration study in  July. Compliant with CPAP at recent visit. Plan for 6 month f/u.   H/o V-tach Pt was evaluated by Dr. Caryl Comes on 9/12. Resumed amiodarone after brief trial off in years past, with recurrence of symptoms. Pt has a new pacemaker since last visit.   R Knee Pain At the end of the visit, pt reports intermittent R knee pain that has flared up. He initially injured his knee in high school while playing sports. Pt requests a few names of local orthopedist.  Patient Active Problem List   Diagnosis Date Noted  . Type II diabetes mellitus ([place]) 01/16/2015  . Hypersomnolence 08/14/2012  . History of Helicobacter pylori infection 04/16/2012  . GERD (gastroesophageal reflux disease) 04/16/2012  . Elevated transaminase level 02/02/2012  . Obesity 12/29/2011  . 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 Syncope   . Ventricular tachycardia ([place])   . Anxiety    Past Medical History:  Diagnosis Date  . 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 Diabetes mellitus without complication ([place])   . Dual implantable cardiac defibrillator in situ    Medtronic D314DRG Protecta  . Elevated transaminase level    10/2011  ? amio  . GERD (gastroesophageal reflux disease)   . HTN (hypertension)   . Other primary cardiomyopathies   . Syncope   . Tubular adenoma of colon   . Ventricular tachycardia ([place])    storm following ICD implant on amiodarone  Past Surgical History:  Procedure Laterality Date  . CARDIAC DEFIBRILLATOR PLACEMENT     Medtronic D314DRG Protecta  . CHOLECYSTECTOMY    . COLONOSCOPY WITH PROPOFOL N/A 06/30/2017   Procedure: COLONOSCOPY WITH PROPOFOL;  Surgeon: Carol Ada, MD;  Location: [facility];  Service: Endoscopy;  Laterality: N/A;  . GALLBLADDER SURGERY    . HERNIA REPAIR    . ICD GENERATOR CHANGEOUT N/A 04/26/2017   Procedure: ICD Generator Changeout;  Surgeon: [name], MD;  Location: [place] [facility];  Service: Cardiovascular;  Laterality: N/A;    . TONSILLECTOMY AND ADENOIDECTOMY     Allergies  Allergen Reactions  . Adhesive [Tape]     Swelling and welts formed   Prior to Admission medications   Medication Sig Start Date End Date Taking? Authorizing Provider  acetaminophen (TYLENOL) 500 MG tablet Take 1,000 mg by mouth daily as needed for moderate pain or headache.    [provider]  amiodarone (PACERONE) 200 MG tablet TAKE ONE-HALF TABLET BY MOUTH ONCE DAILY 04/26/17   [name], MD  aspirin EC 81 MG tablet Take 1 tablet (81 mg total) by mouth daily. 11/11/11   [name], MD  atorvastatin (LIPITOR) 10 MG tablet TAKE 1 TABLET BY MOUTH EVERY DAY 10/02/17   Wendie Agreste, MD  cephALEXin (KEFLEX) 500 MG capsule Take 1 capsule (500 mg total) by mouth 2 (two) times daily. 08/09/17   [name], MD  Cholecalciferol (VITAMIN D) 2000 UNITS tablet Take 2,000 Units by mouth daily.    [provider]  famotidine (PEPCID) 20 MG tablet Take 1 tablet (20 mg total) by mouth 2 (two) times daily as needed. Patient taking differently: Take 20 mg by mouth daily as needed for heartburn or indigestion.  04/16/12   Pyrtle, Lajuan Lines, MD  losartan-hydrochlorothiazide (HYZAAR) 100-12.5 MG tablet TAKE 1 TABLET BY MOUTH EVERY DAY 09/21/17   Wendie Agreste, MD  metFORMIN (GLUCOPHAGE) 500 MG tablet TAKE 1 TABLET ONCE A DAY WITH LARGEST MEAL. 03/30/17   Wendie Agreste, MD  metoprolol succinate (TOPROL-XL) 100 MG 24 hr tablet Take 1 tablet (100 mg total) by mouth daily. Take with or immediately following a meal. 05/10/17   [name], MD  Omega-3 Fatty Acids (FISH OIL) 1000 MG CAPS Take 1,000 mg by mouth 2 (two) times daily.     [provider]  tadalafil (CIALIS) 20 MG tablet Take 0.5-1 tablets (10-20 mg total) by mouth daily as needed for erectile dysfunction. 09/15/16   Wendie Agreste, MD  valACYclovir (VALTREX) 1000 MG tablet Take 1 tablet (1,000 mg total) by mouth daily. Patient taking differently: Take  1,000 mg by mouth daily as needed (fever blisters).  03/02/16   Wendie Agreste, MD   Social History   Socioeconomic History  . Marital status: Married    Spouse name: Not on file  . Number of children: Not on file  . Years of education: Not on file  . Highest education level: Not on file  Social Needs  . Financial resource strain: Not on file  . Food insecurity - worry: Not on file  . Food insecurity - inability: Not on file  . Transportation needs - medical: Not on file  . Transportation needs - non-medical: Not on file  Occupational History  . Occupation: Retired Pharmacist, hospital  Tobacco Use  . Smoking status: Never Smoker  . Smokeless tobacco: Never Used  Substance and Sexual Activity  . Alcohol use:  Yes    Alcohol/week: 1.2 oz    Types: 2 Cans of beer per week    Comment: 4/week  . Drug use: No  . Sexual activity: Yes  Other Topics Concern  . Not on file  Social History Narrative   Divorced. Education: [employer]. Exercise: walking 3 times a week for 30 minutes.   Retired Pharmacist, hospital   Review of Systems  Constitutional: Negative for fatigue and unexpected weight change.  Eyes: Negative for visual disturbance.  Respiratory: Negative for cough, chest tightness and shortness of breath.   Cardiovascular: Negative for chest pain, palpitations and leg swelling.  Gastrointestinal: Negative for abdominal pain and blood in stool.  Musculoskeletal: Positive for arthralgias.  Neurological: Negative for dizziness, light-headedness and headaches.  Hematological: Does not bruise/bleed easily.      Objective:   Physical Exam  Constitutional: He is oriented to person, place, and time. He appears well-developed and well-nourished.  HENT:  Head: Normocephalic and atraumatic.  Eyes: EOM are normal. Pupils are equal, round, and reactive to light.  Neck: No JVD present. Carotid bruit is not present.  Cardiovascular: Normal rate, regular rhythm and normal heart sounds.  No murmur  heard. Pulmonary/Chest: Effort normal and breath sounds normal. He has no rales.  Musculoskeletal: He exhibits no edema.  Neurological: He is alert and oriented to person, place, and time.  Skin: Skin is warm and dry.  Psychiatric: He has a normal mood and affect.  Vitals reviewed.  Vitals:   10/12/17 1404  BP: 126/78  Pulse: (!) 102  Resp: 16  Temp: 99.2 F (37.3 C)  TempSrc: Oral  SpO2: 96%  Weight: 197 lb 12.8 oz (89.7 kg)  Height: 5\' 5"  (1.651 m)      Assessment & Plan:   Dakota Gutierrez is a 66 y.o. male Type 2 diabetes mellitus without complication, without long-term current use of insulin ([place]) - Plan: Hemoglobin A1c, Microalbumin, urine, CANCELED: Hemoglobin A1c  - Tolerating metformin, check A1c. If still controlled, recheck 6 months.  Need for prophylactic vaccination and inoculation against influenza - Plan: Flu Vaccine QUAD 36+ mos IM  Erectile dysfunction, unspecified erectile dysfunction type  - Should have refills left of Cialis  Essential hypertension - Plan: Comprehensive metabolic panel, losartan-hydrochlorothiazide (HYZAAR) 100-12.5 MG tablet  - Improved control, likely due to use of CPAP. No change in regimen.  Ventricular tachycardia ([place])  - Recent new pacer, continued amiodarone, denies any new pulmonary symptoms. follow-up with electrophysiologist as planned  Hyperlipidemia, unspecified hyperlipidemia type - Plan: Comprehensive metabolic panel, Lipid panel, atorvastatin (LIPITOR) 10 MG tablet  - Tolerating Lipitor, check lipid panel, CMP. Has been off meds for the past 2 days, but should not affect levels significantly.  Noted at end of visit some chronic right knee pain. Asked that he follow-up to discuss that further with possible imaging and to discuss plan or orthopedic specialist for referral.   Meds ordered this encounter  Medications  . metFORMIN (GLUCOPHAGE) 500 MG tablet    Sig: TAKE 1 TABLET ONCE A DAY WITH LARGEST MEAL.     Dispense:  90 tablet    Refill:  2  . losartan-hydrochlorothiazide (HYZAAR) 100-12.5 MG tablet    Sig: Take 1 tablet daily by mouth.    Dispense:  90 tablet    Refill:  2  . atorvastatin (LIPITOR) 10 MG tablet    Sig: Take 1 tablet (10 mg total) daily by mouth.    Dispense:  90 tablet    Refill:  2   Patient Instructions    Thanks for coming in today. No change in medications for now. I will let you know if any changes needed or sooner follow-up than 6 months based on your labs.   Please follow up for your knee pain so we can check an xray, and discuss possible treatments and plan (including orthopedists) based on xray and exam.   IF you received an x-ray today, you will receive an invoice from [facility]. Please contact [facility] at [phone] with questions or concerns regarding your invoice.   IF you received labwork today, you will receive an invoice from [facility]. Please contact [facility] at [phone] with questions or concerns regarding your invoice.   Our billing staff will not be able to assist you with questions regarding bills from these companies.  You will be contacted with the lab results as soon as they are available. The fastest way to get your results is to activate your My Chart account. Instructions are located on the last page of this paperwork. If you have not heard from [name] regarding the results in 2 weeks, please contact this office.      I personally performed the services described in this documentation, which was scribed in my presence. The recorded information has been reviewed and considered for accuracy and completeness, addended by me as needed, and agree with information above.  Signed,   [name], MD Primary Care at [place].  10/12/17 3:03 PM

## 2017-10-12 NOTE — Patient Instructions (Addendum)
  Thanks for coming in today. No change in medications for now. I will let you know if any changes needed or sooner follow-up than 6 months based on your labs.   Please follow up for your knee pain so we can check an xray, and discuss possible treatments and plan (including orthopedists) based on xray and exam.   IF you received an x-ray today, you will receive an invoice from [facility]. Please contact [facility] at [phone] with questions or concerns regarding your invoice.   IF you received labwork today, you will receive an invoice from [facility]. Please contact [facility] at [phone] with questions or concerns regarding your invoice.   Our billing staff will not be able to assist you with questions regarding bills from these companies.  You will be contacted with the lab results as soon as they are available. The fastest way to get your results is to activate your My Chart account. Instructions are located on the last page of this paperwork. If you have not heard from [name] regarding the results in 2 weeks, please contact this office.

## 2017-10-13 LAB — COMPREHENSIVE METABOLIC PANEL
ALK PHOS: 45 IU/L (ref 39–117)
ALT: 31 IU/L (ref 0–44)
AST: 35 IU/L (ref 0–40)
Albumin/Globulin Ratio: 2 (ref 1.2–2.2)
Albumin: 4.5 g/dL (ref 3.6–4.8)
BILIRUBIN TOTAL: 0.5 mg/dL (ref 0.0–1.2)
BUN/Creatinine Ratio: 16 (ref 10–24)
BUN: 18 mg/dL (ref 8–27)
CO2: 25 mmol/L (ref 20–29)
CREATININE: 1.14 mg/dL (ref 0.76–1.27)
Calcium: 9.8 mg/dL (ref 8.6–10.2)
Chloride: 98 mmol/L (ref 96–106)
GFR, EST AFRICAN AMERICAN: 76 mL/min/{1.73_m2} (ref >59)
GFR, EST NON AFRICAN AMERICAN: 66 mL/min/{1.73_m2} (ref >59)
GLUCOSE: 149 mg/dL — AB (ref 65–99)
Globulin, Total: 2.3 g/dL (ref 1.5–4.5)
Potassium: 4.4 mmol/L (ref 3.5–5.2)
SODIUM: 137 mmol/L (ref 134–144)
Total Protein: 6.8 g/dL (ref 6.0–8.5)

## 2017-10-13 LAB — LIPID PANEL
CHOL/HDL RATIO: 5.1 ratio — AB (ref 0.0–5.0)
Cholesterol, Total: 154 mg/dL (ref 100–199)
HDL: 30 mg/dL — AB (ref >39)
TRIGLYCERIDES: 480 mg/dL — AB (ref 0–149)

## 2017-10-13 LAB — HEMOGLOBIN A1C
Est. average glucose Bld gHb Est-mCnc: 183 mg/dL
HEMOGLOBIN A1C: 8 % — AB (ref 4.8–5.6)

## 2017-11-08 ENCOUNTER — Ambulatory Visit (INDEPENDENT_AMBULATORY_CARE_PROVIDER_SITE_OTHER): Payer: Medicare Other | Admitting: *Deleted

## 2017-11-08 DIAGNOSIS — I472 Ventricular tachycardia, unspecified: Secondary | ICD-10-CM

## 2017-11-08 NOTE — Progress Notes (Signed)
Remote ICD transmission.   

## 2017-11-10 LAB — CUP PACEART REMOTE DEVICE CHECK
Battery Remaining Longevity: 105 mo
Battery Voltage: 3.02 V
Brady Statistic AS VS Percent: 12.89 %
Date Time Interrogation Session: 20181212083326
HighPow Impedance: 39 Ohm
HighPow Impedance: 46 Ohm
Implantable Lead Implant Date: 20120531
Implantable Lead Location: 753860
Implantable Lead Model: 6947
Implantable Pulse Generator Implant Date: 20180530
Lead Channel Impedance Value: 418 Ohm
Lead Channel Pacing Threshold Pulse Width: 0.4 ms
Lead Channel Sensing Intrinsic Amplitude: 2.5 mV
Lead Channel Sensing Intrinsic Amplitude: 2.5 mV
MDC IDC LEAD IMPLANT DT: 20120531
MDC IDC LEAD LOCATION: 753859
MDC IDC MSMT LEADCHNL RA IMPEDANCE VALUE: 418 Ohm
MDC IDC MSMT LEADCHNL RA PACING THRESHOLD AMPLITUDE: 0.5 V
MDC IDC MSMT LEADCHNL RV IMPEDANCE VALUE: 342 Ohm
MDC IDC MSMT LEADCHNL RV PACING THRESHOLD AMPLITUDE: 0.625 V
MDC IDC MSMT LEADCHNL RV PACING THRESHOLD PULSEWIDTH: 0.4 ms
MDC IDC MSMT LEADCHNL RV SENSING INTR AMPL: 12.125 mV
MDC IDC MSMT LEADCHNL RV SENSING INTR AMPL: 12.125 mV
MDC IDC SET LEADCHNL RA PACING AMPLITUDE: 1.5 V
MDC IDC SET LEADCHNL RV PACING AMPLITUDE: 2 V
MDC IDC SET LEADCHNL RV PACING PULSEWIDTH: 0.4 ms
MDC IDC SET LEADCHNL RV SENSING SENSITIVITY: 0.3 mV
MDC IDC STAT BRADY AP VP PERCENT: 14.81 %
MDC IDC STAT BRADY AP VS PERCENT: 72.28 %
MDC IDC STAT BRADY AS VP PERCENT: 0.02 %
MDC IDC STAT BRADY RA PERCENT PACED: 85.74 %
MDC IDC STAT BRADY RV PERCENT PACED: 14.41 %

## 2017-11-14 ENCOUNTER — Encounter: Payer: Self-pay | Admitting: Cardiology

## 2017-11-28 HISTORY — PX: CATARACT EXTRACTION, BILATERAL: SHX1313

## 2017-12-25 ENCOUNTER — Ambulatory Visit: Payer: Medicare Other

## 2017-12-26 ENCOUNTER — Ambulatory Visit (INDEPENDENT_AMBULATORY_CARE_PROVIDER_SITE_OTHER): Payer: Medicare Other

## 2017-12-26 VITALS — BP 128/86 | HR 100 | Ht 65.0 in | Wt 193.0 lb

## 2017-12-26 DIAGNOSIS — Z0000 Encounter for general adult medical examination without abnormal findings: Secondary | ICD-10-CM

## 2017-12-26 NOTE — Progress Notes (Signed)
Subjective:   Dakota Gutierrez is a 67 y.o. male who presents for an Initial Medicare Annual Wellness Visit.  Review of Systems  N/A Cardiac Risk Factors include: advanced age (>37men, >25 women);diabetes mellitus;male gender    Objective:    Today's Vitals   12/26/17 1328  BP: 128/86  Pulse: 100  SpO2: 97%  Weight: 193 lb (87.5 kg)  Height: 5\' 5"  (1.651 m)   Body mass index is 32.12 kg/m.  Advanced Directives 12/26/2017 06/30/2017 04/26/2017 01/27/2015  Does Patient Have a Medical Advance Directive? Yes Yes Yes Yes  Type of Paramedic of [place];Living will [place];Living will [place];Living will -  Does patient want to make changes to medical advance directive? - - No - Patient declined No - Patient declined  Copy of [place] in Chart? No - copy requested No - copy requested No - copy requested -    Current Medications (verified) Outpatient Encounter Medications as of 12/26/2017  Medication Sig  . acetaminophen (TYLENOL) 500 MG tablet Take 1,000 mg by mouth daily as needed for moderate pain or headache.  Marland Kitchen amiodarone (PACERONE) 200 MG tablet TAKE ONE-HALF TABLET BY MOUTH ONCE DAILY  . aspirin EC 81 MG tablet Take 1 tablet (81 mg total) by mouth daily.  Marland Kitchen atorvastatin (LIPITOR) 10 MG tablet Take 1 tablet (10 mg total) daily by mouth.  . Cholecalciferol (VITAMIN D) 2000 UNITS tablet Take 2,000 Units by mouth daily.  . famotidine (PEPCID) 20 MG tablet Take 1 tablet (20 mg total) by mouth 2 (two) times daily as needed. (Patient taking differently: Take 20 mg by mouth daily as needed for heartburn or indigestion. )  . losartan-hydrochlorothiazide (HYZAAR) 100-12.5 MG tablet Take 1 tablet daily by mouth.  . metFORMIN (GLUCOPHAGE) 500 MG tablet TAKE 1 TABLET ONCE A DAY WITH LARGEST MEAL.  . metoprolol succinate (TOPROL-XL) 100 MG 24 hr tablet Take 1 tablet (100 mg total) by mouth daily. Take with  or immediately following a meal.  . Omega-3 Fatty Acids (FISH OIL) 1000 MG CAPS Take 1,000 mg by mouth 2 (two) times daily.   . tadalafil (CIALIS) 20 MG tablet Take 0.5-1 tablets (10-20 mg total) by mouth daily as needed for erectile dysfunction.  . valACYclovir (VALTREX) 1000 MG tablet Take 1 tablet (1,000 mg total) by mouth daily. (Patient taking differently: Take 1,000 mg by mouth daily as needed (fever blisters). )   No facility-administered encounter medications on file as of 12/26/2017.     Allergies (verified) Adhesive [tape]   History: Past Medical History:  Diagnosis Date  . 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 Diabetes mellitus without complication ([place])   . Dual implantable cardiac defibrillator in situ    Medtronic D314DRG Protecta  . Elevated transaminase level    10/2011  ? amio  . GERD (gastroesophageal reflux disease)   . HTN (hypertension)   . Other primary cardiomyopathies   . Syncope   . Tubular adenoma of colon   . Ventricular tachycardia ([place])    storm following ICD implant on amiodarone    Past Surgical History:  Procedure Laterality Date  . CARDIAC DEFIBRILLATOR PLACEMENT     Medtronic D314DRG Protecta  . CHOLECYSTECTOMY    . COLONOSCOPY WITH PROPOFOL N/A 06/30/2017   Procedure: COLONOSCOPY WITH PROPOFOL;  Surgeon: Carol Ada, MD;  [facility];  Service: Endoscopy;  Laterality: N/A;  . GALLBLADDER SURGERY    .  HERNIA REPAIR    . ICD GENERATOR CHANGEOUT N/A 04/26/2017   Procedure: ICD Generator Changeout;  Surgeon: [name], MD;  Location: [place] [facility];  Service: Cardiovascular;  Laterality: N/A;  . TONSILLECTOMY AND ADENOIDECTOMY     Family History  Problem Relation Age of Onset  . Hyperlipidemia Mother   . Heart disease Father   . Colon cancer Neg Hx   . Esophageal cancer Neg Hx   . Stomach cancer Neg Hx   . Rectal cancer Neg Hx    Social History   Socioeconomic History  . Marital status: Married    Spouse name:  None  . Number of children: 0  . Years of education: None  . Highest education level: Bachelor's degree (e.g., BA, AB, BS)  Social Needs  . Financial resource strain: Not hard at all  . Food insecurity - worry: Never true  . Food insecurity - inability: Never true  . Transportation needs - medical: No  . Transportation needs - non-medical: No  Occupational History  . Occupation: Retired Pharmacist, hospital  Tobacco Use  . Smoking status: Never Smoker  . Smokeless tobacco: Never Used  Substance and Sexual Activity  . Alcohol use: Yes    Alcohol/week: 1.2 oz    Types: 2 Cans of beer per week    Comment: 4/week  . Drug use: No  . Sexual activity: Yes  Other Topics Concern  . None  Social History Narrative   Divorced. Education: [employer]. Exercise: walking 3 times a week for 30 minutes.   Retired Pharmacist, hospital   Tobacco Counseling Counseling given: Not Answered   Clinical Intake:  Pre-visit preparation completed: Yes  Pain : No/denies pain     Nutritional Status: BMI 25 -29 Overweight Nutritional Risks: Nausea/ vomitting/ diarrhea(Patient has issues with diarrhea and is taking medication for it. ) Diabetes: Yes CBG done?: No Did pt. bring in CBG monitor from home?: No  How often do you need to have someone help you when you read instructions, pamphlets, or other written materials from your doctor or pharmacy?: 1 - Never What is the last grade level you completed in school?: Bachelor's Degree  Interpreter Needed?: No  Information entered by :: Andrez Grime, LPN  Activities of Daily Living In your present state of health, do you have any difficulty performing the following activities: 12/26/2017 04/26/2017  Hearing? N N  Vision? Y N  Comment Patient feels his vision is getting worse. Follows up with his eye doctor yearly.  -  Difficulty concentrating or making decisions? N N  Walking or climbing stairs? N N  Dressing or bathing? N N  Doing errands, shopping? N -  Preparing Food  and eating ? N -  Using the Toilet? N -  In the past six months, have you accidently leaked urine? N -  Do you have problems with loss of bowel control? N -  Managing your Medications? N -  Managing your Finances? N -  Housekeeping or managing your Housekeeping? N -  Some recent data might be hidden     Immunizations and Health Maintenance Immunization History  Administered Date(s) Administered  . Influenza,inj,Quad PF,6+ Mos 09/19/2013, 10/28/2014, 08/17/2015, 09/15/2016, 10/12/2017  . Pneumococcal Conjugate-13 10/28/2014  . Pneumococcal Polysaccharide-23 12/22/2016  . Tdap 03/21/2013  . Zoster 12/23/2013   Health Maintenance Due  Topic Date Due  . FOOT EXAM  12/22/2017    Patient Care Team: Wendie Agreste, MD as PCP - General (Family Medicine) Virl Axe  C, MD (Cardiology) [name], MD as Attending Physician (Neurology)  Indicate any recent Medical Services you may have received from other than [facility] providers in the past year (date may be approximate).    Assessment:   This is a routine wellness examination for Ole.  Hearing/Vision screen Hearing Screening Comments: Patient has not had a recent hearing exam.  Vision Screening Comments: Patient sees his eye doctor once a year for his routine eye exams.   Dietary issues and exercise activities discussed: Current Exercise Habits: Home exercise routine, Type of exercise: walking, Time (Minutes): 50, Frequency (Times/Week): 4, Weekly Exercise (Minutes/Week): 200, Intensity: Mild, Exercise limited by: None identified  Goals    . DIET - REDUCE SUGAR INTAKE     Patient states that he wants to continue to monitor his diet and reduce sugar intake.       Depression Screen PHQ 2/9 Scores 12/26/2017 10/12/2017 03/30/2017 09/15/2016  PHQ - 2 Score 0 0 0 0    Fall Risk Fall Risk  12/26/2017 10/12/2017 03/30/2017 12/22/2016 09/15/2016  Falls in the past year? No No No No No    Is the patient's home free of loose throw  rugs in walkways, pet beds, electrical cords, etc?   yes      Grab bars in the bathroom? No      Handrails on the stairs?   yes      Adequate lighting?   yes  Timed Get Up and Go performed: yes, completed within 30 seconds   Cognitive Function:     6CIT Screen 12/26/2017  What Year? 0 points  What month? 0 points  What time? 0 points  Count back from 20 0 points  Months in reverse 0 points  Repeat phrase 2 points  Total Score 2    Screening Tests Health Maintenance  Topic Date Due  . FOOT EXAM  12/22/2017  . OPHTHALMOLOGY EXAM  01/26/2018  . HEMOGLOBIN A1C  04/11/2018  . COLONOSCOPY  06/30/2022  . TETANUS/TDAP  03/22/2023  . INFLUENZA VACCINE  Completed  . Hepatitis C Screening  Completed  . PNA vac Low Risk Adult  Completed    Qualifies for Shingles Vaccine? Advised patient to check with his pharmacy about receiving the Shingrix vaccine.   Cancer Screenings: Lung: Low Dose CT Chest recommended if Age 53-80 years, 30 pack-year currently smoking OR have quit w/in 15years. Patient does not qualify. Colorectal: colonoscopy completed 06/30/2017  Additional Screenings:  Hepatitis B/HIV/Syphillis: not indicated  Hepatitis C Screening: completed 09/19/2013      Plan:   I have personally reviewed and noted the following in the patient's chart:   . Medical and social history . Use of alcohol, tobacco or illicit drugs  . Current medications and supplements . Functional ability and status . Nutritional status . Physical activity . Advanced directives . List of other physicians . Hospitalizations, surgeries, and ER visits in previous 12 months . Vitals . Screenings to include cognitive, depression, and falls . Referrals and appointments  In addition, I have reviewed and discussed with patient certain preventive protocols, quality metrics, and best practice recommendations. A written personalized care plan for preventive services as well as general preventive health  recommendations were provided to patient.   1. Encounter for Medicare annual wellness exam   Andrez Grime, LPN   6/80/3212

## 2017-12-26 NOTE — Patient Instructions (Addendum)
Mr. Dakota Gutierrez , Thank you for taking time to come for your Medicare Wellness Visit. I appreciate your ongoing commitment to your health goals. Please review the following plan we discussed and let me know if I can assist you in the future.   Screening recommendations/referrals: Colonoscopy: up to date, next due 06/30/2022 Recommended yearly ophthalmology/optometry visit for glaucoma screening and checkup Recommended yearly dental visit for hygiene and checkup  Vaccinations: Influenza vaccine: up to date Pneumococcal vaccine: up to date Tdap vaccine: up to date, next due 03/22/2023 Shingles vaccine: Check with your pharmacy about receiving the Shingrix vaccine     Advanced directives: Please bring a copy of your POA (Power of [place]) and/or Living Will to your next appointment.   Conditions/risks identified: Continue to monitor his diet and reduce sugar intake.  Next appointment: 04/12/18 @ 10:20 am with Dr. Carlota Raspberry, next Medicare Wellness visit is 12/27/2018 @ 1:40 pm with Russell 69 Years and Older, Male Preventive care refers to lifestyle choices and visits with your health care provider that can promote health and wellness. What does preventive care include?  A yearly physical exam. This is also called an annual well check.  Dental exams once or twice a year.  Routine eye exams. Ask your health care provider how often you should have your eyes checked.  Personal lifestyle choices, including:  Daily care of your teeth and gums.  Regular physical activity.  Eating a healthy diet.  Avoiding tobacco and drug use.  Limiting alcohol use.  Practicing safe sex.  Taking low doses of aspirin every day.  Taking vitamin and mineral supplements as recommended by your health care provider. What happens during an annual well check? The services and screenings done by your health care provider during your annual well check will depend on your age,  overall health, lifestyle risk factors, and family history of disease. Counseling  Your health care provider may ask you questions about your:  Alcohol use.  Tobacco use.  Drug use.  Emotional well-being.  Home and relationship well-being.  Sexual activity.  Eating habits.  History of falls.  Memory and ability to understand (cognition).  Work and work Statistician. Screening  You may have the following tests or measurements:  Height, weight, and BMI.  Blood pressure.  Lipid and cholesterol levels. These may be checked every 5 years, or more frequently if you are over 36 years old.  Skin check.  Lung cancer screening. You may have this screening every year starting at age 69 if you have a 30-pack-year history of smoking and currently smoke or have quit within the past 15 years.  Fecal occult blood test (FOBT) of the stool. You may have this test every year starting at age 69.  Flexible sigmoidoscopy or colonoscopy. You may have a sigmoidoscopy every 5 years or a colonoscopy every 10 years starting at age 69.  Prostate cancer screening. Recommendations will vary depending on your family history and other risks.  Hepatitis C blood test.  Hepatitis B blood test.  Sexually transmitted disease (STD) testing.  Diabetes screening. This is done by checking your blood sugar (glucose) after you have not eaten for a while (fasting). You may have this done every 1-3 years.  Abdominal aortic aneurysm (AAA) screening. You may need this if you are a current or former smoker.  Osteoporosis. You may be screened starting at age 69 if you are at high risk. Talk with your health care provider about your  test results, treatment options, and if necessary, the need for more tests. Vaccines  Your health care provider may recommend certain vaccines, such as:  Influenza vaccine. This is recommended every year.  Tetanus, diphtheria, and acellular pertussis (Tdap, Td) vaccine. You may  need a Td booster every 10 years.  Zoster vaccine. You may need this after age 1.  Pneumococcal 13-valent conjugate (PCV13) vaccine. One dose is recommended after age 69.  Pneumococcal polysaccharide (PPSV23) vaccine. One dose is recommended after age 69. Talk to your health care provider about which screenings and vaccines you need and how often you need them. This information is not intended to replace advice given to you by your health care provider. Make sure you discuss any questions you have with your health care provider. Document Released: 12/11/2015 Document Revised: 08/03/2016 Document Reviewed: 09/15/2015 Elsevier Interactive Patient Education  2017 [employer] Prevention in the Home Falls can cause injuries. They can happen to people of all ages. There are many things you can do to make your home safe and to help prevent falls. What can I do on the outside of my home?  Regularly fix the edges of walkways and driveways and fix any cracks.  Remove anything that might make you trip as you walk through a door, such as a raised step or threshold.  Trim any bushes or trees on the path to your home.  Use bright outdoor lighting.  Clear any walking paths of anything that might make someone trip, such as rocks or tools.  Regularly check to see if handrails are loose or broken. Make sure that both sides of any steps have handrails.  Any raised decks and porches should have guardrails on the edges.  Have any leaves, snow, or ice cleared regularly.  Use sand or salt on walking paths during winter.  Clean up any spills in your garage right away. This includes oil or grease spills. What can I do in the bathroom?  Use night lights.  Install grab bars by the toilet and in the tub and shower. Do not use towel bars as grab bars.  Use non-skid mats or decals in the tub or shower.  If you need to sit down in the shower, use a plastic, non-slip stool.  Keep the floor  dry. Clean up any water that spills on the floor as soon as it happens.  Remove soap buildup in the tub or shower regularly.  Attach bath mats securely with double-sided non-slip rug tape.  Do not have throw rugs and other things on the floor that can make you trip. What can I do in the bedroom?  Use night lights.  Make sure that you have a light by your bed that is easy to reach.  Do not use any sheets or blankets that are too big for your bed. They should not hang down onto the floor.  Have a firm chair that has side arms. You can use this for support while you get dressed.  Do not have throw rugs and other things on the floor that can make you trip. What can I do in the kitchen?  Clean up any spills right away.  Avoid walking on wet floors.  Keep items that you use a lot in easy-to-reach places.  If you need to reach something above you, use a strong step stool that has a grab bar.  Keep electrical cords out of the way.  Do not use floor polish or wax that  makes floors slippery. If you must use wax, use non-skid floor wax.  Do not have throw rugs and other things on the floor that can make you trip. What can I do with my stairs?  Do not leave any items on the stairs.  Make sure that there are handrails on both sides of the stairs and use them. Fix handrails that are broken or loose. Make sure that handrails are as long as the stairways.  Check any carpeting to make sure that it is firmly attached to the stairs. Fix any carpet that is loose or worn.  Avoid having throw rugs at the top or bottom of the stairs. If you do have throw rugs, attach them to the floor with carpet tape.  Make sure that you have a light switch at the top of the stairs and the bottom of the stairs. If you do not have them, ask someone to add them for you. What else can I do to help prevent falls?  Wear shoes that:  Do not have high heels.  Have rubber bottoms.  Are comfortable and fit you  well.  Are closed at the toe. Do not wear sandals.  If you use a stepladder:  Make sure that it is fully opened. Do not climb a closed stepladder.  Make sure that both sides of the stepladder are locked into place.  Ask someone to hold it for you, if possible.  Clearly mark and make sure that you can see:  Any grab bars or handrails.  First and last steps.  Where the edge of each step is.  Use tools that help you move around (mobility aids) if they are needed. These include:  Canes.  Walkers.  Scooters.  Crutches.  Turn on the lights when you go into a dark area. Replace any light bulbs as soon as they burn out.  Set up your furniture so you have a clear path. Avoid moving your furniture around.  If any of your floors are uneven, fix them.  If there are any pets around you, be aware of where they are.  Review your medicines with your doctor. Some medicines can make you feel dizzy. This can increase your chance of falling. Ask your doctor what other things that you can do to help prevent falls. This information is not intended to replace advice given to you by your health care provider. Make sure you discuss any questions you have with your health care provider. Document Released: 09/10/2009 Document Revised: 04/21/2016 Document Reviewed: 12/19/2014 Elsevier Interactive Patient Education  2017 [employer].

## 2018-01-12 ENCOUNTER — Encounter: Payer: Self-pay | Admitting: Physician Assistant

## 2018-01-26 ENCOUNTER — Ambulatory Visit: Payer: Medicare Other | Admitting: Physician Assistant

## 2018-01-26 ENCOUNTER — Encounter: Payer: Medicare Other | Admitting: Physician Assistant

## 2018-01-26 VITALS — BP 154/86 | HR 96 | Ht 65.0 in | Wt 196.0 lb

## 2018-01-26 DIAGNOSIS — I428 Other cardiomyopathies: Secondary | ICD-10-CM | POA: Diagnosis not present

## 2018-01-26 DIAGNOSIS — I10 Essential (primary) hypertension: Secondary | ICD-10-CM

## 2018-01-26 DIAGNOSIS — I472 Ventricular tachycardia, unspecified: Secondary | ICD-10-CM

## 2018-01-26 DIAGNOSIS — Z79899 Other long term (current) drug therapy: Secondary | ICD-10-CM | POA: Diagnosis not present

## 2018-01-26 NOTE — Progress Notes (Signed)
Cardiology Office Note Date:  01/26/2018  Patient ID:  Dakota, Gutierrez 12-06-1950, MRN [id number] PCP:  Wendie Agreste, MD  Cardiologist:  Dr. Caryl Comes   Chief Complaint:  routine visit  History of Present Illness: Dakota Gutierrez is a 67 y.o. male with history of VT with ICD, HTN, DM, GERD, retinal art occlusion, OSA w/CPAP.  April 2017 had  nonsustained ventricular tachycardia and easily inducible to polymorphic ventricular tachycardia and received an ICD. Unfortunately, in the days that followed he developed VT storm with multiple shocks and was treated with amiodarone which has been gradually down titrated to 100 mg a day.  Amiodarone surveillance had demonstrated elevated liver function tests spring 2013 . Was elected to discontinue the amiodarone albeit with some reluctance on the part of the family. When seen in June of recurrent episodes of ventricular tachycardia 2 of which required therapy. He was then resumed on amiodarone and referred for consultation with GI  most recently 4/17 transaminases were normal".  He was seen by myself in October 2017, his last echo noted mild decrease in LV function and his lopressor changed to Toprol.  He reported feeling pretty well.  He denied any CP, states he gets a discomfort in his chest when he has over eaten, this is chronic and unchanged for years, no palpitations, no SOB.  He denies any dizziness, near syncope or syncope.  His device check that visit noted appropriate tx w/ATP for VT, his Toprol was increased and following w/GI for hx of abn LFTs/amiodarone  He saw Dre. 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  He had ATP x2 for VT, no med changes and planned for gen change.  He saw her last Sep 2018, change no VT, concerns of incisional abcess, and rx a course of antibiotics with plans to f/u in 2 weeks.  I don not see this was done.  He is doing  well.  Reports he walks regularly for exercise, when weather does no permit gets on his elliptical machine.  Feels like his exertional capacity is still good.  Ills make him feel winded, though this is not new.  He mentions a tight/acing feeling in his chest he [erceives as muscular that will happen after yard work with the clippers.  NO CP with exercise or otherwise.  No palpitations, no dizziness, near syncope or syncope.  No symptoms of PND or orthopnea.  Device history:  MDT dual chamber ICD, implanted 04/28/11,  gen change may 2018, Dr. Caryl Comes, primary prevention + hx of appropriate therapy AAD: amiodarone Hx of abn LFTs on amiodarone, was stopped and resumed 2/2 recurrent VT   Past Medical History:  Diagnosis Date  . 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 Diabetes mellitus without complication ([place])   . Dual implantable cardiac defibrillator in situ    Medtronic [id number] Protecta  . Elevated transaminase level    10/2011  ? amio  . GERD (gastroesophageal reflux disease)   . HTN (hypertension)   . Other primary cardiomyopathies   . Syncope   . Tubular adenoma of colon   . Ventricular tachycardia ([place])    storm following ICD implant on amiodarone     Past Surgical History:  Procedure Laterality Date  . CARDIAC DEFIBRILLATOR PLACEMENT     Medtronic [id number] Protecta  . CHOLECYSTECTOMY    . COLONOSCOPY WITH PROPOFOL N/A 06/30/2017   Procedure: COLONOSCOPY WITH  PROPOFOL;  Surgeon: Carol Ada, MD;  Location: [facility];  Service: Endoscopy;  Laterality: N/A;  . GALLBLADDER SURGERY    . HERNIA REPAIR    . ICD GENERATOR CHANGEOUT N/A 04/26/2017   Procedure: ICD Generator Changeout;  Surgeon: [name], MD;  Location: [facility];  Service: Cardiovascular;  Laterality: N/A;  . TONSILLECTOMY AND ADENOIDECTOMY      Current Outpatient Medications  Medication Sig Dispense Refill  . acetaminophen (TYLENOL) 500 MG tablet Take 1,000 mg by mouth daily as needed for moderate  pain or headache.    Marland Kitchen amiodarone (PACERONE) 200 MG tablet TAKE ONE-HALF TABLET BY MOUTH ONCE DAILY 45 tablet 3  . aspirin EC 81 MG tablet Take 1 tablet (81 mg total) by mouth daily.    Marland Kitchen atorvastatin (LIPITOR) 10 MG tablet Take 1 tablet (10 mg total) daily by mouth. 90 tablet 2  . Cholecalciferol (VITAMIN D) 2000 UNITS tablet Take 2,000 Units by mouth daily.    . famotidine (PEPCID) 20 MG tablet Take 1 tablet (20 mg total) by mouth 2 (two) times daily as needed. (Patient taking differently: Take 20 mg by mouth daily as needed for heartburn or indigestion. ) 60 tablet 4  . losartan-hydrochlorothiazide (HYZAAR) 100-12.5 MG tablet Take 1 tablet daily by mouth. 90 tablet 2  . metFORMIN (GLUCOPHAGE) 500 MG tablet TAKE 1 TABLET ONCE A DAY WITH LARGEST MEAL. 90 tablet 2  . metoprolol succinate (TOPROL-XL) 100 MG 24 hr tablet Take 1 tablet (100 mg total) by mouth daily. Take with or immediately following a meal. 90 tablet 2  . Omega-3 Fatty Acids (FISH OIL) 1000 MG CAPS Take 1,000 mg by mouth 2 (two) times daily.     . tadalafil (CIALIS) 20 MG tablet Take 0.5-1 tablets (10-20 mg total) by mouth daily as needed for erectile dysfunction. 10 tablet 11  . valACYclovir (VALTREX) 1000 MG tablet Take 1 tablet (1,000 mg total) by mouth daily. (Patient taking differently: Take 1,000 mg by mouth daily as needed (fever blisters). ) 30 tablet 11   No current facility-administered medications for this visit.     Allergies:   Adhesive [tape]   Social History:  The patient  reports that  has never smoked. he has never used smokeless tobacco. He reports that he drinks about 1.2 oz of alcohol per week. He reports that he does not use drugs.   Family History:  The patient's family history includes Heart disease in his father; Hyperlipidemia in his mother.  ROS:  Please see the history of present illness.  All other systems are reviewed and otherwise negative.   PHYSICAL EXAM:  VS:  There were no vitals taken for  this visit. BMI: There is no height or weight on file to calculate BMI. Well nourished, well developed, in no acute distress  HEENT: normocephalic, atraumatic  Neck: no JVD, carotid bruits or masses Cardiac: RRR; no significant murmurs, no rubs, or gallops Lungs:  CTA b/l, no wheezing, rhonchi or rales  Abd: soft, nontender MS: no deformity or atrophy Ext: no edema  Skin: warm and dry, no rash Neuro:  No gross deficits appreciated, known left eye visual field deficit  Psych: euthymic mood, full affect  ICD site is stable, no tethering or discomfort, skin is intact, no fluid collection, fluctuation   EKG:  Not done today ICD interrogation today and reviewed by myself: battery and lead measurements are good, NSVT episodes, look 1:1 with PVCs, 2 are NSVT 1 and 3  seconds, no AF, no treated episodes, presents AP/VS today, 86%AP, 16% VP  04/07/16: TTE Study Conclusions - Left ventricle: The cavity size was mildly dilated. Wall   thickness was normal. Systolic function was mildly reduced. The   estimated ejection fraction was in the range of 45% to 50%.   Diffuse hypokinesis. Doppler parameters are consistent with   abnormal left ventricular relaxation (grade 1 diastolic   dysfunction). The E/e&' ratio is between 8-15, suggesting   indeterminate LV filling pressure. - Left atrium: The atrium was mildly dilated. - Right ventricle: The cavity size was normal. Pacer wire or   catheter noted in right ventricle. Systolic function is reduced. - Right atrium: The atrium was normal in size. Pacer wire or   catheter noted in right atrium. - Inferior vena cava: The vessel was normal in size. The   respirophasic diameter changes were in the normal range (>= 50%),   consistent with normal central venous pressure. - Pericardium, extracardiac: A trivial pericardial effusion was   identified. Impressions: - LVEF 45-50%, diffuse hypokinesis, normal wall thickness with   dilated LV cavity, diastolic  dysfunction, indeterminate LV   filling pressure, mildly dilated LA, RV/RA AICD wires noted,   reduced RV systolic function, trivial pericardial effusion.  Myoview 2012-no ischemia ejection fraction 56%    Recent Labs: 03/30/2017: TSH 3.840 04/26/2017: Hemoglobin 13.9; Platelets 140 10/12/2017: ALT 31; BUN 18; Creatinine, Ser 1.14; Potassium 4.4; Sodium 137  10/12/2017: Chol/HDL Ratio 5.1; Cholesterol, Total 154; HDL 30; LDL Calculated Comment; Triglycerides 480   CrCl cannot be calculated (Patient's most recent lab result is older than the maximum 21 days allowed.).   Wt Readings from Last 3 Encounters:  12/26/17 193 lb (87.5 kg)  10/12/17 197 lb 12.8 oz (89.7 kg)  09/26/17 199 lb (90.3 kg)     Other studies reviewed: Additional studies/records reviewed today include: summarized above  ASSESSMENT AND PLAN:  1. VT, polymorphic w/ICD     intact device function     On amiodarone, due for TSH, will get LFTs   2. Mild NICM    on BB/ARB    Weight is up a couple pounds, Optivol is well below threshold, no symptoms or exam findings to suggest fluid OL  3. HTN     No changes made today    Disposition: continue 3 mo remotes, see Dr. Caryl Comes in 6 mo, sooner if needed.    Current medicines are reviewed at length with the patient today.  The patient did not have any concerns regarding medicines.  Haywood Lasso, PA-C 01/26/2018 5:18 AM     [facility] [address] [place] [address] [phone] (office)  [phone] (fax)

## 2018-01-26 NOTE — Patient Instructions (Addendum)
Medication Instructions:   Your physician recommends that you continue on your current medications as directed. Please refer to the Current Medication list given to you today.   If you need a refill on your cardiac medications before your next appointment, please call your pharmacy.     Labwork: TSH AND LFT TODAY    Testing/Procedures: NONE ORDERED  TODAY    Follow-Up:  Your physician wants you to follow-up in:  [place]  [place] will receive a reminder letter in the mail two months in advance. If you don't receive a letter, please call our office to schedule the follow-up appointment.    Remote monitoring is used to monitor your Pacemaker of ICD from home. This monitoring reduces the number of office visits required to check your device to one time per year. It allows [name] to keep an eye on the functioning of your device to ensure it is working properly. You are scheduled for a device check from home on .  02-07-18 You may send your transmission at any time that day. If you have a wireless device, the transmission will be sent automatically. After your physician reviews your transmission, you will receive a postcard with your next transmission date.     Any Other Special Instructions Will Be Listed Below (If Applicable).

## 2018-01-27 LAB — HEPATIC FUNCTION PANEL
ALK PHOS: 46 IU/L (ref 39–117)
ALT: 27 IU/L (ref 0–44)
AST: 29 IU/L (ref 0–40)
Albumin: 4.6 g/dL (ref 3.6–4.8)
Bilirubin Total: 0.5 mg/dL (ref 0.0–1.2)
Bilirubin, Direct: 0.16 mg/dL (ref 0.00–0.40)
TOTAL PROTEIN: 6.7 g/dL (ref 6.0–8.5)

## 2018-01-27 LAB — TSH: TSH: 3.73 u[IU]/mL (ref 0.450–4.500)

## 2018-03-27 ENCOUNTER — Ambulatory Visit: Payer: Medicare Other | Admitting: Adult Health

## 2018-04-10 ENCOUNTER — Telehealth: Payer: Self-pay

## 2018-04-10 NOTE — Telephone Encounter (Signed)
   [facility] Pre-operative Risk Assessment    Request for surgical clearance:  1. What type of surgery is being performed? Cataract Surgery   2. When is this surgery scheduled? 04/17/18   3. What type of clearance is required (medical clearance vs. Pharmacy clearance to hold med vs. Both)? Medical  4. Are there any medications that need to be held prior to surgery and how long? None   5. Practice name and name of physician performing surgery? Monna Fam, MD   6. What is your office phone number [phone]    7.   What is your office fax [phone]  8.   Anesthesia type (None, local, MAC, general) ? MAC   Dakota Gutierrez 04/10/2018, 5:36 PM  _________________________________________________________________   (provider comments below)

## 2018-04-11 NOTE — Telephone Encounter (Signed)
   Primary Cardiologist:   Chart reviewed as part of pre-operative protocol coverage. Given past medical history and time since last visit, based on ACC/AHA guidelines, UZZIEL RUSSEY would be at acceptable risk for the planned procedure without further cardiovascular testing.   I will route this recommendation to the requesting party via Epic fax function and remove from pre-op pool.  Please call with questions.  Kerin Ransom, PA-C 04/11/2018, 3:58 PM

## 2018-04-12 ENCOUNTER — Ambulatory Visit: Payer: Medicare Other | Admitting: Family Medicine

## 2018-04-26 ENCOUNTER — Other Ambulatory Visit: Payer: Self-pay | Admitting: Internal Medicine

## 2018-04-26 DIAGNOSIS — I472 Ventricular tachycardia, unspecified: Secondary | ICD-10-CM

## 2018-04-26 DIAGNOSIS — I10 Essential (primary) hypertension: Secondary | ICD-10-CM

## 2018-04-26 NOTE — Telephone Encounter (Signed)
New Message:        *STAT* If patient is at the pharmacy, call can be transferred to refill team.   1. Which medications need to be refilled? (please list name of each medication and dose if known) amiodarone (PACERONE) 200 MG tablet  metoprolol succinate (TOPROL-XL) 100 MG 24 hr tablet  2. Which pharmacy/location (including street and city if local pharmacy) is medication to be sent to?[place] ([place]), [place] [address]  3. Do they need a 30 day or 90 day supply? 56     Pt states he is having to go out of town unexpectedly due to his sisters health. Pt is trying to go out of town tomorrow and need medication refill today.

## 2018-04-30 ENCOUNTER — Telehealth: Payer: Self-pay | Admitting: Cardiology

## 2018-04-30 ENCOUNTER — Ambulatory Visit (INDEPENDENT_AMBULATORY_CARE_PROVIDER_SITE_OTHER): Payer: Medicare Other | Admitting: *Deleted

## 2018-04-30 DIAGNOSIS — I472 Ventricular tachycardia: Secondary | ICD-10-CM

## 2018-04-30 NOTE — Telephone Encounter (Signed)
LMOVM reminding pt to send remote transmission.   

## 2018-05-02 ENCOUNTER — Encounter: Payer: Self-pay | Admitting: Cardiology

## 2018-05-02 NOTE — Progress Notes (Signed)
Remote ICD transmission.   

## 2018-05-02 NOTE — Progress Notes (Signed)
[place]

## 2018-05-03 LAB — CUP PACEART REMOTE DEVICE CHECK
Battery Voltage: 3 V
Brady Statistic AP VP Percent: 11.85 %
Brady Statistic AP VS Percent: 75.06 %
Brady Statistic AS VS Percent: 13.08 %
Brady Statistic RV Percent Paced: 11.64 %
Date Time Interrogation Session: 20190604223530
HIGH POWER IMPEDANCE MEASURED VALUE: 48 Ohm
HighPow Impedance: 39 Ohm
Implantable Lead Implant Date: 20120531
Implantable Lead Location: 753859
Implantable Lead Model: 4076
Implantable Pulse Generator Implant Date: 20180530
Lead Channel Impedance Value: 399 Ohm
Lead Channel Pacing Threshold Pulse Width: 0.4 ms
Lead Channel Sensing Intrinsic Amplitude: 10.625 mV
Lead Channel Sensing Intrinsic Amplitude: 2.125 mV
Lead Channel Setting Pacing Pulse Width: 0.4 ms
MDC IDC LEAD IMPLANT DT: 20120531
MDC IDC LEAD LOCATION: 753860
MDC IDC MSMT BATTERY REMAINING LONGEVITY: 100 mo
MDC IDC MSMT LEADCHNL RA PACING THRESHOLD AMPLITUDE: 0.5 V
MDC IDC MSMT LEADCHNL RA PACING THRESHOLD PULSEWIDTH: 0.4 ms
MDC IDC MSMT LEADCHNL RA SENSING INTR AMPL: 2.125 mV
MDC IDC MSMT LEADCHNL RV IMPEDANCE VALUE: 342 Ohm
MDC IDC MSMT LEADCHNL RV IMPEDANCE VALUE: 456 Ohm
MDC IDC MSMT LEADCHNL RV PACING THRESHOLD AMPLITUDE: 0.5 V
MDC IDC MSMT LEADCHNL RV SENSING INTR AMPL: 10.625 mV
MDC IDC SET LEADCHNL RA PACING AMPLITUDE: 1.5 V
MDC IDC SET LEADCHNL RV PACING AMPLITUDE: 2 V
MDC IDC SET LEADCHNL RV SENSING SENSITIVITY: 0.3 mV
MDC IDC STAT BRADY AS VP PERCENT: 0.02 %
MDC IDC STAT BRADY RA PERCENT PACED: 86.1 %

## 2018-05-22 ENCOUNTER — Ambulatory Visit: Payer: Medicare Other | Admitting: Family Medicine

## 2018-07-20 ENCOUNTER — Other Ambulatory Visit: Payer: Self-pay | Admitting: Family Medicine

## 2018-07-20 DIAGNOSIS — I10 Essential (primary) hypertension: Secondary | ICD-10-CM

## 2018-07-26 ENCOUNTER — Encounter: Payer: Self-pay | Admitting: Internal Medicine

## 2018-07-26 ENCOUNTER — Ambulatory Visit: Payer: Medicare Other | Admitting: Internal Medicine

## 2018-07-26 VITALS — BP 108/70 | HR 91 | Ht 65.0 in | Wt 193.2 lb

## 2018-07-26 DIAGNOSIS — I472 Ventricular tachycardia, unspecified: Secondary | ICD-10-CM

## 2018-07-26 DIAGNOSIS — Z79899 Other long term (current) drug therapy: Secondary | ICD-10-CM | POA: Diagnosis not present

## 2018-07-26 DIAGNOSIS — I428 Other cardiomyopathies: Secondary | ICD-10-CM

## 2018-07-26 LAB — CUP PACEART INCLINIC DEVICE CHECK
Brady Statistic AP VS Percent: 76.01 %
Brady Statistic AS VP Percent: 0.02 %
Brady Statistic RV Percent Paced: 11.97 %
HIGH POWER IMPEDANCE MEASURED VALUE: 41 Ohm
HIGH POWER IMPEDANCE MEASURED VALUE: 49 Ohm
Implantable Lead Implant Date: 20120531
Implantable Lead Location: 753859
Implantable Lead Model: 4076
Implantable Lead Model: 6947
Lead Channel Impedance Value: 418 Ohm
Lead Channel Pacing Threshold Amplitude: 0.5 V
Lead Channel Pacing Threshold Amplitude: 0.75 V
Lead Channel Pacing Threshold Pulse Width: 0.4 ms
Lead Channel Sensing Intrinsic Amplitude: 12.5 mV
Lead Channel Sensing Intrinsic Amplitude: 2.625 mV
Lead Channel Setting Pacing Amplitude: 1.5 V
Lead Channel Setting Pacing Amplitude: 2 V
MDC IDC LEAD IMPLANT DT: 20120531
MDC IDC LEAD LOCATION: 753860
MDC IDC MSMT BATTERY REMAINING LONGEVITY: 98 mo
MDC IDC MSMT BATTERY VOLTAGE: 2.98 V
MDC IDC MSMT LEADCHNL RA PACING THRESHOLD PULSEWIDTH: 0.4 ms
MDC IDC MSMT LEADCHNL RA SENSING INTR AMPL: 2 mV
MDC IDC MSMT LEADCHNL RV IMPEDANCE VALUE: 361 Ohm
MDC IDC MSMT LEADCHNL RV IMPEDANCE VALUE: 456 Ohm
MDC IDC MSMT LEADCHNL RV SENSING INTR AMPL: 13.125 mV
MDC IDC PG IMPLANT DT: 20180530
MDC IDC SESS DTM: 20190829171854
MDC IDC SET LEADCHNL RV PACING PULSEWIDTH: 0.4 ms
MDC IDC SET LEADCHNL RV SENSING SENSITIVITY: 0.3 mV
MDC IDC STAT BRADY AP VP PERCENT: 12.25 %
MDC IDC STAT BRADY AS VS PERCENT: 11.73 %
MDC IDC STAT BRADY RA PERCENT PACED: 87.41 %

## 2018-07-26 NOTE — Patient Instructions (Signed)
Medication Instructions:  Your physician recommends that you continue on your current medications as directed. Please refer to the Current Medication list given to you today.  Labwork: You will have labs drawn today: BMP, TSH, LFT  Testing/Procedures: None ordered.  Follow-Up: Your physician wants you to follow-up in: 6 months with Dr Caryl Comes. You will receive a reminder letter in the mail two months in advance. If you don't receive a letter, please call our office to schedule the follow-up appointment.  Remote monitoring is used to monitor your Pacemaker of ICD from home. This monitoring reduces the number of office visits required to check your device to one time per year. It allows [name] to keep an eye on the functioning of your device to ensure it is working properly. You are scheduled for a device check from home on 07/31/2018. You may send your transmission at any time that day. If you have a wireless device, the transmission will be sent automatically. After your physician reviews your transmission, you will receive a postcard with your next transmission date.     Any Other Special Instructions Will Be Listed Below (If Applicable).     If you need a refill on your cardiac medications before your next appointment, please call your pharmacy.

## 2018-07-26 NOTE — Progress Notes (Signed)
Thank you very much yet      Patient Care Team: Wendie Agreste, MD as PCP - General (Family Medicine) [name], MD (Cardiology) [name], MD as Attending Physician (Neurology)   HPI  Dakota Gutierrez is a 67 y.o. 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 Unfortunately, in the days that followed he developed VT storm with multiple shocks and was treated with amiodarone which has been gradually down titrated to 100 mg a day.  Amiodarone surveillance had demonstrated elevated liver function tests spring 2013 . We elected to discontinue the amiodarone albeit with some reluctance on the part of the family. When seen in June of recurrent episodes of ventricular tachycardia 2 of which required therapy. He was then resumed on amiodarone and referred for consultation with GI  most recently 4/17 transaminases were normal.     He has hx   intercurrent pace terminated ventricular tachycardia.  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  There have been no palpitations, lightheadedness or syncope.       Date TSH  ALT   5/18 3.84 25    3/19 3.73 27       Myoview 2012-no ischemia ejection fraction 56% Echocardiogram 5/17 EF 45-50%  Past Medical History:  Diagnosis Date  . 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 Diabetes mellitus without complication ([place])   . Dual implantable cardiac defibrillator in situ    Medtronic D314DRG Protecta  . Elevated transaminase level    10/2011  ? amio  . GERD (gastroesophageal reflux disease)   . HTN (hypertension)   . Other primary cardiomyopathies   . Syncope   . Tubular adenoma of colon   . Ventricular tachycardia ([place])    storm following ICD implant on  amiodarone     Past Surgical History:  Procedure Laterality Date  . CARDIAC DEFIBRILLATOR PLACEMENT     Medtronic [id number] Protecta  . CHOLECYSTECTOMY    . COLONOSCOPY WITH PROPOFOL N/A 06/30/2017   Procedure: COLONOSCOPY WITH PROPOFOL;  Surgeon: Carol Ada, MD;  Location: [facility];  Service: Endoscopy;  Laterality: N/A;  . GALLBLADDER SURGERY    . HERNIA REPAIR    . ICD GENERATOR CHANGEOUT N/A 04/26/2017   Procedure: ICD Generator Changeout;  Surgeon: [name], MD;  Location: [facility];  Service: Cardiovascular;  Laterality: N/A;  . TONSILLECTOMY AND ADENOIDECTOMY      Current Outpatient Medications  Medication Sig Dispense Refill  . acetaminophen (TYLENOL) 500 MG tablet Take 1,000 mg by mouth daily as needed for moderate pain or headache.    Marland Kitchen amiodarone (PACERONE) 200 MG tablet TAKE 1/2 (ONE-HALF) TABLET BY MOUTH ONCE DAILY 15 tablet 6  . aspirin EC 81 MG tablet Take 1 tablet (81 mg total) by mouth daily.    Marland Kitchen atorvastatin (LIPITOR) 10 MG tablet Take 1 tablet (10 mg total) daily by mouth. 90 tablet 2  . Cholecalciferol (VITAMIN D) 2000 UNITS tablet Take 2,000 Units by mouth daily.    . famotidine (PEPCID) 20 MG tablet Take 1 tablet (20 mg total) by mouth 2 (two) times daily as needed. 60 tablet 4  . losartan-hydrochlorothiazide (HYZAAR) 100-12.5 MG tablet TAKE 1 TABLET BY MOUTH EVERY DAY  30 tablet 0  . metFORMIN (GLUCOPHAGE) 500 MG tablet Take 500 mg by mouth daily with breakfast.    . metoprolol succinate (TOPROL-XL) 100 MG 24 hr tablet TAKE 1 TABLET BY MOUTH ONCE DAILY IMMEDIATELY  FOLLOWING  A  MEAL 30 tablet 6  . Omega-3 Fatty Acids (FISH OIL) 1000 MG CAPS Take 1,000 mg by mouth 2 (two) times daily.     . tadalafil (CIALIS) 20 MG tablet Take 0.5-1 tablets (10-20 mg total) by mouth daily as needed for erectile dysfunction. 10 tablet 11  . valACYclovir (VALTREX) 1000 MG tablet Take 1 tablet (1,000 mg total) by mouth daily. 30 tablet 11   No current  facility-administered medications for this visit.     Allergies  Allergen Reactions  . Adhesive [Tape]     Swelling and welts formed    Review of Systems negative except from HPI and PMH except for complaints of back pain and muscle pain  Physical Exam BP 108/70   Pulse 91   Ht 5\' 5"  (1.651 m)   Wt 193 lb 3.2 oz (87.6 kg)   SpO2 96%   BMI 32.15 kg/m  Well developed and nourished in no acute distress HENT normal Neck supple with JVP-flat Clear Regular rate and rhythm, 2/6 m Abd-soft with active BS No Clubbing cyanosis 1+ edema Skin-warm and dry A & Oriented  Grossly normal sensory and motor function   ECG  a pacing 91 24/11/36 occ PVC    Assessment and  Plan  VT Recurrent treated Ventricular tachycardia --ATPX2    ICD Medtronic The patient's device was interrogated.  The information was reviewed. No changes were made in the programming.     Sinus noded dysfunction   Amiodarone rx    No intercurrent Ventricular tachycardia  Tolerating amio  Needs surveillance labs  Mild volume overload, encouraged to decrease sodium and fluid intake

## 2018-07-27 LAB — BASIC METABOLIC PANEL
BUN / CREAT RATIO: 17 (ref 10–24)
BUN: 15 mg/dL (ref 8–27)
CALCIUM: 9.8 mg/dL (ref 8.6–10.2)
CHLORIDE: 96 mmol/L (ref 96–106)
CO2: 24 mmol/L (ref 20–29)
Creatinine, Ser: 0.9 mg/dL (ref 0.76–1.27)
GFR, EST AFRICAN AMERICAN: 100 mL/min/{1.73_m2} (ref >59)
GFR, EST NON AFRICAN AMERICAN: 87 mL/min/{1.73_m2} (ref >59)
Glucose: 245 mg/dL — ABNORMAL HIGH (ref 65–99)
Potassium: 4.1 mmol/L (ref 3.5–5.2)
Sodium: 139 mmol/L (ref 134–144)

## 2018-07-27 LAB — HEPATIC FUNCTION PANEL
ALBUMIN: 4.6 g/dL (ref 3.6–4.8)
ALT: 24 IU/L (ref 0–44)
AST: 28 IU/L (ref 0–40)
Alkaline Phosphatase: 46 IU/L (ref 39–117)
BILIRUBIN TOTAL: 0.6 mg/dL (ref 0.0–1.2)
Bilirubin, Direct: 0.18 mg/dL (ref 0.00–0.40)
Total Protein: 7 g/dL (ref 6.0–8.5)

## 2018-07-27 LAB — TSH: TSH: 3.49 u[IU]/mL (ref 0.450–4.500)

## 2018-07-31 ENCOUNTER — Encounter: Payer: Medicare Other | Admitting: *Deleted

## 2018-07-31 ENCOUNTER — Telehealth: Payer: Self-pay | Admitting: Cardiology

## 2018-07-31 NOTE — Telephone Encounter (Signed)
LMOVM for to return call. Ok to reschedule remote appt to 10-30-18.

## 2018-07-31 NOTE — Telephone Encounter (Signed)
-----   Message from Dollene Primrose, RN sent at 07/26/2018  4:30 PM EDT ----- Regarding: Remote Check Hello,  This pt wants to cancel his remote check on 9/3 because he doesn't want to pay the co pay since he just had a check today. I tried to explain to him why we like to keep the same remote dates, but he would like to speak with one of you regarding this.  Thanks :)  [place]

## 2018-08-01 NOTE — Telephone Encounter (Signed)
Spoke w/ pt and informed him that I rescheduled his remote appt to 10-30-18. Pt verbalized understanding.

## 2018-08-19 ENCOUNTER — Other Ambulatory Visit: Payer: Self-pay | Admitting: Family Medicine

## 2018-08-19 DIAGNOSIS — I10 Essential (primary) hypertension: Secondary | ICD-10-CM

## 2018-09-04 ENCOUNTER — Encounter

## 2018-09-04 ENCOUNTER — Encounter: Payer: Self-pay | Admitting: Adult Health

## 2018-09-04 ENCOUNTER — Ambulatory Visit: Payer: Medicare Other | Admitting: Adult Health

## 2018-09-04 VITALS — BP 157/94 | HR 105 | Ht 65.0 in | Wt 193.0 lb

## 2018-09-04 DIAGNOSIS — G4733 Obstructive sleep apnea (adult) (pediatric): Secondary | ICD-10-CM

## 2018-09-04 DIAGNOSIS — Z9989 Dependence on other enabling machines and devices: Secondary | ICD-10-CM | POA: Diagnosis not present

## 2018-09-04 NOTE — Progress Notes (Addendum)
PATIENT: Dakota Gutierrez DOB: 20-Jul-1951  REASON FOR VISIT: follow up HISTORY FROM: patient  HISTORY OF PRESENT ILLNESS: Today 09/04/18:  Dakota Gutierrez is a 67 year old male with a history of obstructive sleep apnea on CPAP.  He returns today for follow-up.  His CPAP download indicates that he uses machine 28 out of 30 days for compliance of 93%.  He uses machine greater than 4 hours 20 days for compliance of 67%.  On average he uses his machine 4 hours and 34 minutes.  His residual AHI is 1.3 on 9 cm of water with EPR of 3 his leak in the 95th percentile is 9.9 L/min.  He is currently wearing the nasal pillows.  He complains of dry eyes and he reports that his ophthalmologist stated that it was from the CPAP however since he is using the nasal pillows and he has a very low leak this seems unlikely.  He returns today for follow-up  HISTORY 09/26/2017: I reviewed his CPAP compliance data from 08/25/2017 through 09/23/2017 which is a total of 30 days, during which time he used his CPAP 25 days with percent used days greater than 4 hours at 70%, indicating adequate compliance with an average usage of 5 hours and 11 minutes, residual AHI 1.1 per hour, leak low with the 95th percentile at 5.9 L/m on a pressure of 9 cm with EPR of 3. He reports doing well with CPAP. Feels better rested, more consistent with usage. Nocturia is about the same. Snoring much better, per wife. Using nasal pillows.    REVIEW OF SYSTEMS: Out of a complete 14 system review of symptoms, the patient complains only of the following symptoms, and all other reviewed systems are negative.  Itching, back pain  ALLERGIES: Allergies  Allergen Reactions  . Adhesive [Tape]     Swelling and welts formed    HOME MEDICATIONS: Outpatient Medications Prior to Visit  Medication Sig Dispense Refill  . acetaminophen (TYLENOL) 500 MG tablet Take 1,000 mg by mouth daily as needed for moderate pain or headache.    Marland Kitchen amiodarone  (PACERONE) 200 MG tablet TAKE 1/2 (ONE-HALF) TABLET BY MOUTH ONCE DAILY 15 tablet 6  . aspirin EC 81 MG tablet Take 1 tablet (81 mg total) by mouth daily.    Marland Kitchen atorvastatin (LIPITOR) 10 MG tablet Take 1 tablet (10 mg total) daily by mouth. 90 tablet 2  . Cholecalciferol (VITAMIN D) 2000 UNITS tablet Take 2,000 Units by mouth daily.    . famotidine (PEPCID) 20 MG tablet Take 1 tablet (20 mg total) by mouth 2 (two) times daily as needed. 60 tablet 4  . losartan-hydrochlorothiazide (HYZAAR) 100-12.5 MG tablet TAKE 1 TABLET BY MOUTH EVERY DAY 30 tablet 0  . metFORMIN (GLUCOPHAGE) 500 MG tablet Take 500 mg by mouth daily with breakfast.    . metoprolol succinate (TOPROL-XL) 100 MG 24 hr tablet TAKE 1 TABLET BY MOUTH ONCE DAILY IMMEDIATELY  FOLLOWING  A  MEAL 30 tablet 6  . Omega-3 Fatty Acids (FISH OIL) 1000 MG CAPS Take 1,000 mg by mouth 2 (two) times daily.     Marland Kitchen OVER THE COUNTER MEDICATION     . tadalafil (CIALIS) 20 MG tablet Take 0.5-1 tablets (10-20 mg total) by mouth daily as needed for erectile dysfunction. 10 tablet 11  . valACYclovir (VALTREX) 1000 MG tablet Take 1 tablet (1,000 mg total) by mouth daily. 30 tablet 11   No facility-administered medications prior to visit.     PAST  MEDICAL HISTORY: Past Medical History:  Diagnosis Date  . 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 Diabetes mellitus without complication ([place])   . Dual implantable cardiac defibrillator in situ    Medtronic D314DRG Protecta  . Elevated transaminase level    10/2011  ? amio  . GERD (gastroesophageal reflux disease)   . HTN (hypertension)   . Other primary cardiomyopathies   . Syncope   . Tubular adenoma of colon   . Ventricular tachycardia ([place])    storm following ICD implant on amiodarone     PAST SURGICAL HISTORY: Past Surgical History:  Procedure Laterality Date  . CARDIAC DEFIBRILLATOR PLACEMENT     Medtronic D314DRG Protecta  . CATARACT EXTRACTION, BILATERAL  2019  . CHOLECYSTECTOMY    .  COLONOSCOPY WITH PROPOFOL N/A 06/30/2017   Procedure: COLONOSCOPY WITH PROPOFOL;  Surgeon: Carol Ada, MD;  [facility];  Service: Endoscopy;  Laterality: N/A;  . GALLBLADDER SURGERY    . HERNIA REPAIR    . ICD GENERATOR CHANGEOUT N/A 04/26/2017   Procedure: ICD Generator Changeout;  Surgeon: [name], MD;  Location: [facility];  Service: Cardiovascular;  Laterality: N/A;  . TONSILLECTOMY AND ADENOIDECTOMY      FAMILY HISTORY: Family History  Problem Relation Age of Onset  . Hyperlipidemia Mother   . Heart disease Father   . Colon cancer Neg Hx   . Esophageal cancer Neg Hx   . Stomach cancer Neg Hx   . Rectal cancer Neg Hx     SOCIAL HISTORY: Social History   Socioeconomic History  . Marital status: Married    Spouse name: Not on file  . Number of children: 0  . Years of education: Not on file  . Highest education level: Bachelor's degree (e.g., BA, AB, BS)  Occupational History  . Occupation: Retired Tour manager  . Financial resource strain: Not hard at all  . Food insecurity:    Worry: Never true    Inability: Never true  . Transportation needs:    Medical: No    Non-medical: No  Tobacco Use  . Smoking status: Never Smoker  . Smokeless tobacco: Never Used  Substance and Sexual Activity  . Alcohol use: Yes    Alcohol/week: 2.0 standard drinks    Types: 2 Cans of beer per week    Comment: 4/week  . Drug use: No  . Sexual activity: Yes  Lifestyle  . Physical activity:    Days per week: 4 days    Minutes per session: 50 min  . Stress: Not at all  Relationships  . Social connections:    Talks on phone: More than three times a week    Gets together: More than three times a week    Attends religious service: More than 4 times per year    Active member of club or organization: No    Attends meetings of clubs or organizations: Never    Relationship status: Married  . Intimate partner violence:    Fear of current or ex  partner: No    Emotionally abused: No    Physically abused: No    Forced sexual activity: No  Other Topics Concern  . Not on file  Social History Narrative   Divorced. Education: [employer]. Exercise: walking 3 times a week for 30 minutes.   Retired Pharmacist, hospital      PHYSICAL EXAM  Vitals:   09/04/18 0815  BP: (!) 157/94  Pulse: Marland Kitchen)  105  Weight: 193 lb (87.5 kg)  Height: 5\' 5"  (1.651 m)   Body mass index is 32.12 kg/m.  Generalized: Well developed, in no acute distress   Neurological examination  Mentation: Alert oriented to time, place, history taking. Follows all commands speech and language fluent Cranial nerve II-XII: Pupils were equal round reactive to light. Extraocular movements were full, visual field were full on confrontational test. Facial sensation and strength were normal. Uvula tongue midline. Head turning and shoulder shrug  were normal and symmetric.  Neck circumference 18 inches, Mallampati 4+ Motor: The motor testing reveals 5 over 5 strength of all 4 extremities. Good symmetric motor tone is noted throughout.  Sensory: Sensory testing is intact to soft touch on all 4 extremities. No evidence of extinction is noted.  Coordination: Cerebellar testing reveals good finger-nose-finger and heel-to-shin bilaterally.  Gait and station: Gait is normal.  Reflexes: Deep tendon reflexes are symmetric and normal bilaterally.   DIAGNOSTIC DATA (LABS, IMAGING, TESTING) - I reviewed patient records, labs, notes, testing and imaging myself where available.  Lab Results  Component Value Date   WBC 4.1 04/26/2017   HGB 13.9 04/26/2017   HCT 40.9 04/26/2017   MCV 87.4 04/26/2017   PLT 140 (L) 04/26/2017      Component Value Date/Time   NA 139 07/26/2018 1633   K 4.1 07/26/2018 1633   CL 96 07/26/2018 1633   CO2 24 07/26/2018 1633   GLUCOSE 245 (H) 07/26/2018 1633   GLUCOSE 163 (H) 04/26/2017 1001   BUN 15 07/26/2018 1633   CREATININE 0.90 07/26/2018 1633   CREATININE  1.19 09/15/2016 1709   CALCIUM 9.8 07/26/2018 1633   PROT 7.0 07/26/2018 1633   ALBUMIN 4.6 07/26/2018 1633   AST 28 07/26/2018 1633   ALT 24 07/26/2018 1633   ALKPHOS 46 07/26/2018 1633   BILITOT 0.6 07/26/2018 1633   GFRNONAA 87 07/26/2018 1633   GFRNONAA 77 03/02/2016 1143   GFRAA 100 07/26/2018 1633   GFRAA 89 03/02/2016 1143   Lab Results  Component Value Date   CHOL 154 10/12/2017   HDL 30 (L) 10/12/2017   LDLCALC Comment 10/12/2017   LDLDIRECT 90.5 08/06/2013   TRIG 480 (H) 10/12/2017   CHOLHDL 5.1 (H) 10/12/2017   Lab Results  Component Value Date   HGBA1C 8.0 (H) 10/12/2017   No results found for: [id number] Lab Results  Component Value Date   TSH 3.490 07/26/2018      ASSESSMENT AND PLAN 67 y.o. year old male  has a past medical history of Anxiety, Diabetes mellitus without complication ([place]), Dual implantable cardiac defibrillator in situ, Elevated transaminase level, GERD (gastroesophageal reflux disease), HTN (hypertension), Other primary cardiomyopathies, Syncope, Tubular adenoma of colon, and Ventricular tachycardia ([place]). here with:  1.  Obstructive sleep apnea on CPAP  The patient CPAP download shows excellent compliance and good treatment of his apnea.  I advised that he could go to a larger size of the nasal pillows to see if that is beneficial.  He voiced understanding.  Advised that if his symptoms worsen or he develops new symptoms he should let us know.  He will follow-up in 1 year or sooner if needed.  I spent 15 minutes with the patient. 50% of this time was spent reviewing his CPAP download   Ward Givens, MSN, NP-C 09/04/2018, 8:36 AM [facility] [address] [place], [place] [address] [phone]  I reviewed the above note and documentation by the Nurse  Practitioner and agree with the history, physical exam, assessment and plan as outlined above. I was immediately available for face-to-face  consultation. [name], MD, PhD [facility])

## 2018-09-04 NOTE — Patient Instructions (Signed)
Your Plan:  Continue using CPAP nightly and >4 hours each night If your symptoms worsen or you develop new symptoms please let us know.   Thank you for coming to see us at [facility]. I hope we have been able to provide you high quality care today.  You may receive a patient satisfaction survey over the next few weeks. We would appreciate your feedback and comments so that we may continue to improve ourselves and the health of our patients.  

## 2018-09-19 ENCOUNTER — Other Ambulatory Visit: Payer: Self-pay | Admitting: Internal Medicine

## 2018-09-19 DIAGNOSIS — I472 Ventricular tachycardia, unspecified: Secondary | ICD-10-CM

## 2018-09-19 DIAGNOSIS — I10 Essential (primary) hypertension: Secondary | ICD-10-CM

## 2018-09-19 MED ORDER — METOPROLOL SUCCINATE ER 100 MG PO TB24: ORAL_TABLET | ORAL | 3 refills | Status: DC

## 2018-10-01 ENCOUNTER — Other Ambulatory Visit: Payer: Self-pay | Admitting: Family Medicine

## 2018-10-01 DIAGNOSIS — I10 Essential (primary) hypertension: Secondary | ICD-10-CM

## 2018-10-01 NOTE — Telephone Encounter (Signed)
16 pills given until next office visit on 10/17/18. Requested Prescriptions  Pending Prescriptions Disp Refills  . losartan-hydrochlorothiazide (HYZAAR) 100-12.5 MG tablet [Pharmacy Med Name: LOSARTAN-HCTZ 100-12.5 MG TAB] 16 tablet 0    Sig: TAKE 1 TABLET BY MOUTH EVERY DAY     Cardiovascular: ARB + Diuretic Combos Failed - 10/01/2018  1:51 AM      Failed - Last BP in normal range    BP Readings from Last 1 Encounters:  09/04/18 (!) 157/94         Failed - Valid encounter within last 6 months    Recent Outpatient Visits          11 months ago Type 2 diabetes mellitus without complication, without long-term current use of insulin ([place])   [facility], Ranell Patrick, MD   1 year ago Type 2 diabetes mellitus without complication, without long-term current use of insulin [facility])   [facility] at [facility], Ranell Patrick, MD   1 year ago Type 2 diabetes mellitus without complication, without long-term current use of insulin [facility])   [facility] at [facility], Ranell Patrick, MD   2 years ago Controlled type 2 diabetes mellitus without complication, without long-term current use of insulin [facility])   [facility] at [facility], Ranell Patrick, MD   2 years ago Controlled type 2 diabetes mellitus without complication, without long-term current use of insulin [facility])   [facility] at [facility], Ranell Patrick, MD      Future Appointments            In 2 weeks Wendie Agreste, MD Primary Care at [place], [facility]   In 2 months  Primary Care at [place], [place] in normal range and within 180 days    Potassium  Date Value Ref Range Status  07/26/2018 4.1 3.5 - 5.2 mmol/L Final         Passed - Na in normal range and within 180 days    Sodium  Date Value Ref Range Status  07/26/2018 139 134 - 144 mmol/L Final         Passed - Cr in normal range and within 180 days    Creat  Date Value Ref Range Status  09/15/2016 1.19 0.70 - 1.25 mg/dL Final    Comment:       For patients > or = 69 years of age: The upper reference limit for Creatinine is approximately 13% higher for people identified as African-American.      Creatinine, Ser  Date Value Ref Range Status  07/26/2018 0.90 0.76 - 1.27 mg/dL Final         Passed - Ca in normal range and within 180 days    Calcium  Date Value Ref Range Status  07/26/2018 9.8 8.6 - 10.2 mg/dL Final         Passed - Patient is not pregnant

## 2018-10-05 ENCOUNTER — Other Ambulatory Visit: Payer: Self-pay | Admitting: Family Medicine

## 2018-10-05 DIAGNOSIS — I10 Essential (primary) hypertension: Secondary | ICD-10-CM

## 2018-10-07 ENCOUNTER — Other Ambulatory Visit: Payer: Self-pay | Admitting: Family Medicine

## 2018-10-07 DIAGNOSIS — E785 Hyperlipidemia, unspecified: Secondary | ICD-10-CM

## 2018-10-08 NOTE — Telephone Encounter (Signed)
Requested Prescriptions  Pending Prescriptions Disp Refills  . atorvastatin (LIPITOR) 10 MG tablet [Pharmacy Med Name: ATORVASTATIN 10 MG TABLET] 30 tablet 0    Sig: TAKE 1 TABLET DAILY BY MOUTH.     Cardiovascular:  Antilipid - Statins Failed - 10/07/2018  1:14 AM      Failed - HDL in normal range and within 360 days    HDL  Date Value Ref Range Status  10/12/2017 30 (L) >39 mg/dL Final         Failed - Triglycerides in normal range and within 360 days    Triglycerides  Date Value Ref Range Status  10/12/2017 480 (H) 0 - 149 mg/dL Final         Passed - Total Cholesterol in normal range and within 360 days    Cholesterol, Total  Date Value Ref Range Status  10/12/2017 154 100 - 199 mg/dL Final         Passed - LDL in normal range and within 360 days    LDL Calculated  Date Value Ref Range Status  10/12/2017 Comment 0 - 99 mg/dL Final    Comment:    Triglyceride result indicated is too high for an accurate LDL cholesterol estimation.          Passed - Patient is not pregnant      Passed - Valid encounter within last 12 months    Recent Outpatient Visits          12 months ago Type 2 diabetes mellitus without complication, without long-term current use of insulin ([place])   [facility], Ranell Patrick, MD   1 year ago Type 2 diabetes mellitus without complication, without long-term current use of insulin [facility]   [facility] at [facility], Ranell Patrick, MD   1 year ago Type 2 diabetes mellitus without complication, without long-term current use of insulin [facility])   [facility] at [facility], Ranell Patrick, MD   2 years ago Controlled type 2 diabetes mellitus without complication, without long-term current use of insulin [facility])   [facility] at [place] [facility], Ranell Patrick, MD   2 years ago Controlled type 2 diabetes mellitus without complication, without long-term current use of insulin [facility])   Primary Care at [place], MD      Future  Appointments            In 1 week Carlota Raspberry Ranell Patrick, MD Primary Care at [place], [facility]   In 2 months  Primary Care at [place], [facility]         30 day courtesy refill provided to bridge to upcoming appointment. Note to pharmacy specified patient must keep this appointment for additional refills.

## 2018-10-17 ENCOUNTER — Encounter: Payer: Self-pay | Admitting: Family Medicine

## 2018-10-17 ENCOUNTER — Other Ambulatory Visit: Payer: Self-pay

## 2018-10-17 ENCOUNTER — Ambulatory Visit: Payer: Medicare Other | Admitting: Family Medicine

## 2018-10-17 VITALS — BP 150/80 | HR 109 | Temp 98.4°F | Ht 65.0 in | Wt 194.0 lb

## 2018-10-17 DIAGNOSIS — E1165 Type 2 diabetes mellitus with hyperglycemia: Secondary | ICD-10-CM | POA: Diagnosis not present

## 2018-10-17 DIAGNOSIS — Z23 Encounter for immunization: Secondary | ICD-10-CM | POA: Diagnosis not present

## 2018-10-17 DIAGNOSIS — I10 Essential (primary) hypertension: Secondary | ICD-10-CM

## 2018-10-17 DIAGNOSIS — E785 Hyperlipidemia, unspecified: Secondary | ICD-10-CM

## 2018-10-17 LAB — COMPREHENSIVE METABOLIC PANEL
A/G RATIO: 2 (ref 1.2–2.2)
ALT: 12 IU/L (ref 0–44)
AST: 17 IU/L (ref 0–40)
Albumin: 4.6 g/dL (ref 3.6–4.8)
Alkaline Phosphatase: 55 IU/L (ref 39–117)
BUN/Creatinine Ratio: 14 (ref 10–24)
BUN: 11 mg/dL (ref 8–27)
Bilirubin Total: 0.3 mg/dL (ref 0.0–1.2)
CO2: 22 mmol/L (ref 20–29)
Calcium: 9.4 mg/dL (ref 8.6–10.2)
Chloride: 104 mmol/L (ref 96–106)
Creatinine, Ser: 0.77 mg/dL (ref 0.76–1.27)
GFR calc Af Amer: 107 mL/min/{1.73_m2} (ref >59)
GFR, EST NON AFRICAN AMERICAN: 93 mL/min/{1.73_m2} (ref >59)
GLUCOSE: 83 mg/dL (ref 65–99)
Globulin, Total: 2.3 g/dL (ref 1.5–4.5)
POTASSIUM: 4.9 mmol/L (ref 3.5–5.2)
Sodium: 138 mmol/L (ref 134–144)
TOTAL PROTEIN: 6.9 g/dL (ref 6.0–8.5)

## 2018-10-17 LAB — LIPID PANEL
CHOL/HDL RATIO: 2.2 ratio (ref 0.0–5.0)
Cholesterol, Total: 172 mg/dL (ref 100–199)
HDL: 79 mg/dL (ref >39)
LDL CALC: 82 mg/dL (ref 0–99)
TRIGLYCERIDES: 55 mg/dL (ref 0–149)
VLDL Cholesterol Cal: 11 mg/dL (ref 5–40)

## 2018-10-17 LAB — POCT GLYCOSYLATED HEMOGLOBIN (HGB A1C): Hemoglobin A1C: 8.1 % — AB (ref 4.0–5.6)

## 2018-10-17 LAB — GLUCOSE, POCT (MANUAL RESULT ENTRY): POC Glucose: 162 mg/dl — AB (ref 70–99)

## 2018-10-17 MED ORDER — METFORMIN HCL 500 MG PO TABS: 500.0000 mg | ORAL_TABLET | Freq: Two times a day (BID) | ORAL | 1 refills | Status: DC

## 2018-10-17 MED ORDER — METFORMIN HCL 500 MG PO TABS: 500.0000 mg | ORAL_TABLET | Freq: Every day | ORAL | 1 refills | Status: DC

## 2018-10-17 MED ORDER — ATORVASTATIN CALCIUM 10 MG PO TABS: 10.0000 mg | ORAL_TABLET | Freq: Every day | ORAL | 1 refills | Status: DC

## 2018-10-17 MED ORDER — LOSARTAN POTASSIUM-HCTZ 100-12.5 MG PO TABS: 1.0000 | ORAL_TABLET | Freq: Every day | ORAL | 1 refills | Status: DC

## 2018-10-17 NOTE — Progress Notes (Signed)
Subjective:  By signing my name below, I, Dakota Gutierrez, attest that this documentation has been prepared under the direction and in the presence of Dakota Ray, MD. Electronically Signed: Moises Gutierrez, [place]. 10/17/2018 , 11:17 AM .  Patient was seen in Room 9 .   Patient ID: Dakota Gutierrez, male    DOB: 04-17-1951, 67 y.o.   MRN: [id number] Chief Complaint  Patient presents with  . Diabetes    (fasting)  . Medication Refill   HPI Dakota Gutierrez is a 67 y.o. male Here for follow up. He was last seen by me in Nov 2018. He did have a medicare wellness exam in January. He has a history of diabetes, OSA on CPAP and history of vtach. He is fasting today.   Diabetes  Lab Results  Component Value Date   HGBA1C 8.0 (H) 10/12/2017   Wt Readings from Last 3 Encounters:  10/17/18 194 lb (88 kg)  09/04/18 193 lb (87.5 kg)  07/26/18 193 lb 3.2 oz (87.6 kg)  10/12/2017 - 197 lbs   At last visit in Nov 2018,  Dentist: seen every 6 months Optho: 02/01/18 Foot: done today.  Pneumovax: Jan 2018 Urine micro albumin: not recently done.   He takes metformin 500 mg qd. He is on a statin and an ARB. His glucose was 245 on 8/29 from EP. He had cataract surgery with bilateral implants; will see ophthalmologist in March. For exercise, he's still walking about 1-1.5 miles 3-4x a week. He's been checking his sugars at home, running around 140s-160s, which jumped up from 120s. He denies any further diarrhea with metformin or other side effects recently. He denies missing any doses of his medication.   His optho is Dr. Zadie Rhine; last diabetic eye exam on 02/01/18.   Hyperlipidemia Lab Results  Component Value Date   CHOL 154 10/12/2017   HDL 30 (L) 10/12/2017   LDLCALC Comment 10/12/2017   LDLDIRECT 90.5 08/06/2013   TRIG 480 (H) 10/12/2017   CHOLHDL 5.1 (H) 10/12/2017   Lab Results  Component Value Date   ALT 24 07/26/2018   AST 28 07/26/2018   ALKPHOS 46 07/26/2018   BILITOT  0.6 07/26/2018   He takes Lipitor 10 mg qd.   HTN BP Readings from Last 3 Encounters:  10/17/18 (!) 150/80  09/04/18 (!) 157/94  07/26/18 108/70   Lab Results  Component Value Date   CREATININE 0.90 07/26/2018   He takes Hyzaar 100-12.5 mg qd and Toprol-XL 100 mg qd. He ran out of his medications about 2 weeks ago. He has a BP machine at home to check his BP. He denies any chest pain, shortness of breath, lightheadedness, dizziness, or headaches.   History of vtach Cardiologist/EP is Dr. Caryl Comes, with last office visit on Aug 29th. He has an ICD in place, without change in programming at that time. He was continued on amiodarone. He had mild volume overload, encouraged to decrease sodium intake, and increase water intake. Recheck in 6 months. He had monitoring lab work including hepatic function panel, BMP and TSH on 8/29.   He denies any recent vtach flares since new device placed.   Patient Active Problem List   Diagnosis Date Noted  . Type II diabetes mellitus ([place]) 01/16/2015  . Hypersomnolence 08/14/2012  . History of Helicobacter pylori infection 04/16/2012  . GERD (gastroesophageal reflux disease) 04/16/2012  . Elevated transaminase level 02/02/2012  . Obesity 12/29/2011  . 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  Syncope   . Ventricular tachycardia ([place])   . Anxiety    Past Medical History:  Diagnosis Date  . 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 Diabetes mellitus without complication ([place])   . Dual implantable cardiac defibrillator in situ    Medtronic [id number] Protecta  . Elevated transaminase level    10/2011  ? amio  . GERD (gastroesophageal reflux disease)   . HTN (hypertension)   . Other primary cardiomyopathies   . Syncope   . Tubular adenoma of colon   . Ventricular tachycardia ([place])    storm following ICD implant on amiodarone    Past Surgical History:  Procedure Laterality Date  . CARDIAC DEFIBRILLATOR PLACEMENT      Medtronic [id number] Protecta  . CATARACT EXTRACTION, BILATERAL  2019  . CHOLECYSTECTOMY    . COLONOSCOPY WITH PROPOFOL N/A 06/30/2017   Procedure: COLONOSCOPY WITH PROPOFOL;  Surgeon: Carol Ada, MD;  Location: [facility];  Service: Endoscopy;  Laterality: N/A;  . GALLBLADDER SURGERY    . HERNIA REPAIR    . ICD GENERATOR CHANGEOUT N/A 04/26/2017   Procedure: ICD Generator Changeout;  Surgeon: [name], MD;  Location: [facility];  Service: Cardiovascular;  Laterality: N/A;  . TONSILLECTOMY AND ADENOIDECTOMY     Allergies  Allergen Reactions  . Adhesive [Tape]     Swelling and welts formed   Prior to Admission medications   Medication Sig Start Date End Date Taking? Authorizing Provider  acetaminophen (TYLENOL) 500 MG tablet Take 1,000 mg by mouth daily as needed for moderate pain or headache.    [provider]  amiodarone (PACERONE) 200 MG tablet TAKE 1/2 (ONE-HALF) TABLET BY MOUTH ONCE DAILY 04/26/18   [name], MD  aspirin EC 81 MG tablet Take 1 tablet (81 mg total) by mouth daily. 11/11/11   [name], MD  atorvastatin (LIPITOR) 10 MG tablet TAKE 1 TABLET DAILY BY MOUTH. 10/08/18   Wendie Agreste, MD  Cholecalciferol (VITAMIN D) 2000 UNITS tablet Take 2,000 Units by mouth daily.    [provider]  famotidine (PEPCID) 20 MG tablet Take 1 tablet (20 mg total) by mouth 2 (two) times daily as needed. 04/16/12   Pyrtle, Lajuan Lines, MD  losartan-hydrochlorothiazide (HYZAAR) 100-12.5 MG tablet TAKE 1 TABLET BY MOUTH EVERY DAY 10/01/18   Wendie Agreste, MD  metFORMIN (GLUCOPHAGE) 500 MG tablet Take 500 mg by mouth daily with breakfast.    [provider]  metoprolol succinate (TOPROL-XL) 100 MG 24 hr tablet TAKE 1 TABLET BY MOUTH ONCE DAILY IMMEDIATELY  FOLLOWING  A  MEAL 09/19/18   [name], MD  Omega-3 Fatty Acids (FISH OIL) 1000 MG CAPS Take 1,000 mg by mouth 2 (two) times daily.     [provider]  OVER THE COUNTER  MEDICATION     [provider]  tadalafil (CIALIS) 20 MG tablet Take 0.5-1 tablets (10-20 mg total) by mouth daily as needed for erectile dysfunction. 09/15/16   Wendie Agreste, MD  valACYclovir (VALTREX) 1000 MG tablet Take 1 tablet (1,000 mg total) by mouth daily. 03/02/16   Wendie Agreste, MD   Social History   Socioeconomic History  . Marital status: Married    Spouse name: Not on file  . Number of children: 0  . Years of education: Not on file  . Highest education level: Bachelor's degree (e.g., BA, AB, BS)  Occupational History  . Occupation: Retired Pharmacist, hospital  Social Needs  . Financial resource strain: Not hard at all  . Food insecurity:    Worry: Never true    Inability: Never true  . Transportation needs:    Medical: No    Non-medical: No  Tobacco Use  . Smoking status: Never Smoker  . Smokeless tobacco: Never Used  Substance and Sexual Activity  . Alcohol use: Yes    Alcohol/week: 2.0 standard drinks    Types: 2 Cans of beer per week    Comment: 4/week  . Drug use: No  . Sexual activity: Yes  Lifestyle  . Physical activity:    Days per week: 4 days    Minutes per session: 50 min  . Stress: Not at all  Relationships  . Social connections:    Talks on phone: More than three times a week    Gets together: More than three times a week    Attends religious service: More than 4 times per year    Active member of club or organization: No    Attends meetings of clubs or organizations: Never    Relationship status: Married  . Intimate partner violence:    Fear of current or ex partner: No    Emotionally abused: No    Physically abused: No    Forced sexual activity: No  Other Topics Concern  . Not on file  Social History Narrative   Divorced. Education: [employer]. Exercise: walking 3 times a week for 30 minutes.   Retired Pharmacist, hospital   Review of Systems  Constitutional: Negative for fatigue and unexpected weight change.  Eyes: Negative for visual  disturbance.  Respiratory: Negative for cough, chest tightness and shortness of breath.   Cardiovascular: Negative for chest pain, palpitations and leg swelling.  Gastrointestinal: Negative for abdominal pain and Gutierrez in stool.  Neurological: Negative for dizziness, light-headedness and headaches.       Objective:   Physical Exam  Constitutional: He is oriented to person, place, and time. He appears well-developed and well-nourished.  HENT:  Head: Normocephalic and atraumatic.  Eyes: Pupils are equal, round, and reactive to light. EOM are normal.  Neck: No JVD present. Carotid bruit is not present.  Cardiovascular: Normal rate, regular rhythm and normal heart sounds.  No murmur heard. Pulmonary/Chest: Effort normal and breath sounds normal. He has no rales.  Musculoskeletal: He exhibits no edema.  Neurological: He is alert and oriented to person, place, and time.  Skin: Skin is warm and dry.  Psychiatric: He has a normal mood and affect.  Vitals reviewed.   Vitals:   10/17/18 1036 10/17/18 1040  BP: (!) 163/92 (!) 150/80  Pulse: (!) 109   Temp: 98.4 F (36.9 C)   TempSrc: Oral   SpO2: 96%   Weight: 194 lb (88 kg)   Height: '5\' 5"'  (1.651 m)    Diabetic Foot Exam - Simple   Simple Foot Form Diabetic Foot exam was performed with the following findings:  Yes 10/17/2018 10:38 AM  Visual Inspection No deformities, no ulcerations, no other skin breakdown bilaterally:  Yes Sensation Testing Intact to touch and monofilament testing bilaterally:  Yes Pulse Check Posterior Tibialis and Dorsalis pulse intact bilaterally:  Yes Comments Feet swelling     Results for orders placed or performed in visit on 10/17/18  POCT glucose (manual entry)  Result Value Ref Range   POC Glucose 162 (A) 70 - 99 mg/dl  POCT glycosylated hemoglobin (Hb A1C)  Result Value Ref Range   Hemoglobin A1C  8.1 (A) 4.0 - 5.6 %   HbA1c POC (<> result, manual entry)     HbA1c, POC (prediabetic range)       HbA1c, POC (controlled diabetic range)          Assessment & Plan:    DRAVIN LANCE is a 67 y.o. male Type 2 diabetes mellitus with hyperglycemia, without long-term current use of insulin ([place]) - Plan: Microalbumin / creatinine urine ratio, POCT glucose (manual entry), POCT glycosylated hemoglobin (Hb A1C), metFORMIN (GLUCOPHAGE) 500 MG tablet, DISCONTINUED: metFORMIN (GLUCOPHAGE) 500 MG tablet  -Nonadherent to follow-up since last year, but has been tolerating metformin 500 mg daily.  Based on readings today will increase to twice daily, handout given on diabetes nutrition as well as diabetes self-care and follow-up intervals.  - check microalbumin/creat ratio and repeat OV in 3 months.   Need for influenza vaccination - Plan: Flu vaccine HIGH DOSE PF (Fluzone High dose)  Hyperlipidemia, unspecified hyperlipidemia type - Plan: Lipid panel, atorvastatin (LIPITOR) 10 MG tablet  -  Stable, tolerating current regimen. Medications refilled. Labs pending as above.   Essential hypertension - Plan: Comprehensive metabolic panel, losartan-hydrochlorothiazide (HYZAAR) 100-12.5 MG tablet  - uncontrolled off meds. Restart prior dose Hyzaar, and monitor home readings for improvement.  rtc precautions if persistent elevations.   Meds ordered this encounter  Medications  . atorvastatin (LIPITOR) 10 MG tablet    Sig: Take 1 tablet (10 mg total) by mouth daily.    Dispense:  90 tablet    Refill:  1  . losartan-hydrochlorothiazide (HYZAAR) 100-12.5 MG tablet    Sig: Take 1 tablet by mouth daily.    Dispense:  90 tablet    Refill:  1  . DISCONTD: metFORMIN (GLUCOPHAGE) 500 MG tablet    Sig: Take 1 tablet (500 mg total) by mouth daily with breakfast.    Dispense:  180 tablet    Refill:  1  . metFORMIN (GLUCOPHAGE) 500 MG tablet    Sig: Take 1 tablet (500 mg total) by mouth 2 (two) times daily with a meal.    Dispense:  180 tablet    Refill:  1   Patient Instructions   Restart same  dose of Gutierrez pressure meds, monitor at home and if running over 140/90 in next 2-3 weeks, return to discuss changes.   Increase metformin to twice per day. If diarrhea at that dose, return to discuss other options. Repeat tests in 3 months.   Return to the clinic or go to the nearest emergency room if any of your symptoms worsen or new symptoms occur.   Type 2 Diabetes Mellitus, Self Care, Adult When you have type 2 diabetes (type 2 diabetes mellitus), you must keep your Gutierrez sugar (glucose) under control. You can do this with:  Nutrition.  Exercise.  Lifestyle changes.  Medicines or insulin, if needed.  Support from your doctors and others.  How do I manage my Gutierrez sugar?  Check your Gutierrez sugar level every day, as often as told.  Call your doctor if your Gutierrez sugar is above your goal numbers for 2 tests in a row.  Have your A1c (hemoglobin A1c) level checked at least two times a year. Have it checked more often if your doctor tells you to. Your doctor will set treatment goals for you. Generally, you should have these Gutierrez sugar levels:  Before meals (preprandial): 80-130 mg/dL (4.4-7.2 mmol/L).  After meals (postprandial): lower than 180 mg/dL (10 mmol/L).  A1c level: less  than 7%.  What do I need to know about high Gutierrez sugar? High Gutierrez sugar is called hyperglycemia. Know the signs of high Gutierrez sugar. Signs may include:  Feeling: ? Thirsty. ? Hungry. ? Very tired.  Needing to pee (urinate) more than usual.  Blurry vision.  What do I need to know about low Gutierrez sugar? Low Gutierrez sugar is called hypoglycemia. This is when Gutierrez sugar is at or below 70 mg/dL (3.9 mmol/L). Symptoms may include:  Feeling: ? Hungry. ? Worried or nervous (anxious). ? Sweaty and clammy. ? Confused. ? Dizzy. ? Sleepy. ? Sick to your stomach (nauseous).  Having: ? A fast heartbeat (palpitations). ? A headache. ? A change in your vision. ? Jerky movements that you cannot  control (seizure). ? Nightmares. ? Tingling or no feeling (numbness) around the mouth, lips, or tongue.  Having trouble with: ? Talking. ? Paying attention (concentrating). ? Moving (coordination). ? Sleeping.  Shaking.  Passing out (fainting).  Getting upset easily (irritability).  Treating low Gutierrez sugar  To treat low Gutierrez sugar, eat or drink something sugary right away. If you can think clearly and swallow safely, follow the 15:15 rule:  Take 15 grams of a fast-acting carb (carbohydrate). Some fast-acting carbs are: ? 1 tube of glucose gel. ? 3 sugar tablets (glucose pills). ? 6-8 pieces of hard candy. ? 4 oz (120 mL) of fruit juice. ? 4 oz (120 mL) regular (not diet) soda.  Check your Gutierrez sugar 15 minutes after you take the carb.  If your Gutierrez sugar is still at or below 70 mg/dL (3.9 mmol/L), take 15 grams of a carb again.  If your Gutierrez sugar does not go above 70 mg/dL (3.9 mmol/L) after 3 tries, get help right away.  After your Gutierrez sugar goes back to normal, eat a meal or a snack within 1 hour.  Treating very low Gutierrez sugar If your Gutierrez sugar is at or below 54 mg/dL (3 mmol/L), you have very low Gutierrez sugar (severe hypoglycemia). This is an emergency. Do not wait to see if the symptoms will go away. Get medical help right away. Call your local emergency services (911 in the [place] Do not drive yourself to the hospital. If you have very low Gutierrez sugar and you cannot eat or drink, you may need a glucagon shot (injection). A family member or friend should learn how to check your Gutierrez sugar and how to give you a glucagon shot. Ask your doctor if you need to have a glucagon shot kit at home. What else is important to manage my diabetes? Medicine Follow these instructions about insulin and diabetes medicines:  Take them as told by your doctor.  Adjust them as told by your doctor.  Do not run out of them.  Having diabetes can raise your risk for other  long-term conditions. These include heart or kidney disease. Your doctor may prescribe medicines to help prevent problems from diabetes. Food   Make healthy food choices. These include: ? Chicken, fish, egg whites, and beans. ? Oats, whole wheat, bulgur, brown rice, quinoa, and millet. ? Fresh fruits and vegetables. ? Low-fat dairy products. ? Nuts, avocado, olive oil, and canola oil.  Make a food plan with a specialist (dietitian).  Follow instructions from your doctor about what you cannot eat or drink.  Drink enough fluid to keep your pee (urine) clear or pale yellow.  Eat healthy snacks between healthy meals.  Keep track of carbs that you eat.  Read food labels. Learn food serving sizes.  Follow your sick day plan when you cannot eat or drink normally. Make this plan with your doctor so it is ready to use. Activity  Exercise at least 3 times a week.  Do not go more than 2 days without exercising.  Talk with your doctor before you start a new exercise. Your doctor may need to adjust your insulin, medicines, or food. Lifestyle   Do not use any tobacco products. These include cigarettes, chewing tobacco, and e-cigarettes.If you need help quitting, ask your doctor.  Ask your doctor how much alcohol is safe for you.  Learn to deal with stress. If you need help with this, ask your doctor. Body care  Stay up to date with your shots (immunizations).  Have your eyes and feet checked by a doctor as often as told.  Check your skin and feet every day. Check for cuts, bruises, redness, blisters, or sores.  Brush your teeth and gums two times a day.  Floss at least one time a day.  Go to the dentist least one time every 6 months.  Stay at a healthy weight. General instructions   Take over-the-counter and prescription medicines only as told by your doctor.  Share your diabetes care plan with: ? Your work or school. ? People you live with.  Check your pee (urine) for  ketones: ? When you are sick. ? As told by your doctor.  Carry a card or wear jewelry that says that you have diabetes.  Ask your doctor: ? Do I need to meet with a diabetes educator? ? Where can I find a support group for people with diabetes?  Keep all follow-up visits as told by your doctor. This is important. Where to find more information: To learn more about diabetes, visit:  American Diabetes Association: www.diabetes.org  American Association of Diabetes Educators: www.diabeteseducator.org/patient-resources  This information is not intended to replace advice given to you by your health care provider. Make sure you discuss any questions you have with your health care provider. Document Released: 03/07/2016 Document Revised: 04/21/2016 Document Reviewed: 12/18/2015 Elsevier Interactive Patient Education  2018 [employer].  Diabetes Mellitus and Nutrition When you have diabetes (diabetes mellitus), it is very important to have healthy eating habits because your Gutierrez sugar (glucose) levels are greatly affected by what you eat and drink. Eating healthy foods in the appropriate amounts, at about the same times every day, can help you:  Control your Gutierrez glucose.  Lower your risk of heart disease.  Improve your Gutierrez pressure.  Reach or maintain a healthy weight.  Every person with diabetes is different, and each person has different needs for a meal plan. Your health care provider may recommend that you work with a diet and nutrition specialist (dietitian) to make a meal plan that is best for you. Your meal plan may vary depending on factors such as:  The calories you need.  The medicines you take.  Your weight.  Your Gutierrez glucose, Gutierrez pressure, and cholesterol levels.  Your activity level.  Other health conditions you have, such as heart or kidney disease.  How do carbohydrates affect me? Carbohydrates affect your Gutierrez glucose level more than any other type  of food. Eating carbohydrates naturally increases the amount of glucose in your Gutierrez. Carbohydrate counting is a method for keeping track of how many carbohydrates you eat. Counting carbohydrates is important to keep your Gutierrez glucose at a healthy level, especially if you use  insulin or take certain oral diabetes medicines. It is important to know how many carbohydrates you can safely have in each meal. This is different for every person. Your dietitian can help you calculate how many carbohydrates you should have at each meal and for snack. Foods that contain carbohydrates include:  Bread, cereal, rice, pasta, and crackers.  Potatoes and corn.  Peas, beans, and lentils.  Milk and yogurt.  Fruit and juice.  Desserts, such as cakes, cookies, ice cream, and candy.  How does alcohol affect me? Alcohol can cause a sudden decrease in Gutierrez glucose (hypoglycemia), especially if you use insulin or take certain oral diabetes medicines. Hypoglycemia can be a life-threatening condition. Symptoms of hypoglycemia (sleepiness, dizziness, and confusion) are similar to symptoms of having too much alcohol. If your health care provider says that alcohol is safe for you, follow these guidelines:  Limit alcohol intake to no more than 1 drink per day for nonpregnant women and 2 drinks per day for men. One drink equals 12 oz of beer, 5 oz of wine, or 1 oz of hard liquor.  Do not drink on an empty stomach.  Keep yourself hydrated with water, diet soda, or unsweetened iced tea.  Keep in mind that regular soda, juice, and other mixers may contain a lot of sugar and must be counted as carbohydrates.  What are tips for following this plan? Reading food labels  Start by checking the serving size on the label. The amount of calories, carbohydrates, fats, and other nutrients listed on the label are based on one serving of the food. Many foods contain more than one serving per package.  Check the total grams  (g) of carbohydrates in one serving. You can calculate the number of servings of carbohydrates in one serving by dividing the total carbohydrates by 15. For example, if a food has 30 g of total carbohydrates, it would be equal to 2 servings of carbohydrates.  Check the number of grams (g) of saturated and trans fats in one serving. Choose foods that have low or no amount of these fats.  Check the number of milligrams (mg) of sodium in one serving. Most people should limit total sodium intake to less than 2,300 mg per day.  Always check the nutrition information of foods labeled as "low-fat" or "nonfat". These foods may be higher in added sugar or refined carbohydrates and should be avoided.  Talk to your dietitian to identify your daily goals for nutrients listed on the label. Shopping  Avoid buying canned, premade, or processed foods. These foods tend to be high in fat, sodium, and added sugar.  Shop around the outside edge of the grocery store. This includes fresh fruits and vegetables, bulk grains, fresh meats, and fresh dairy. Cooking  Use low-heat cooking methods, such as baking, instead of high-heat cooking methods like deep frying.  Cook using healthy oils, such as olive, canola, or sunflower oil.  Avoid cooking with butter, cream, or high-fat meats. Meal planning  Eat meals and snacks regularly, preferably at the same times every day. Avoid going long periods of time without eating.  Eat foods high in fiber, such as fresh fruits, vegetables, beans, and whole grains. Talk to your dietitian about how many servings of carbohydrates you can eat at each meal.  Eat 4-6 ounces of lean protein each day, such as lean meat, chicken, fish, eggs, or tofu. 1 ounce is equal to 1 ounce of meat, chicken, or fish, 1 egg, or 1/4 cup  of tofu.  Eat some foods each day that contain healthy fats, such as avocado, nuts, seeds, and fish. Lifestyle   Check your Gutierrez glucose regularly.  Exercise at  least 30 minutes 5 or more days each week, or as told by your health care provider.  Take medicines as told by your health care provider.  Do not use any products that contain nicotine or tobacco, such as cigarettes and e-cigarettes. If you need help quitting, ask your health care provider.  Work with a Social worker or diabetes educator to identify strategies to manage stress and any emotional and social challenges. What are some questions to ask my health care provider?  Do I need to meet with a diabetes educator?  Do I need to meet with a dietitian?  What number can I call if I have questions?  When are the best times to check my Gutierrez glucose? Where to find more information:  American Diabetes Association: diabetes.org/food-and-fitness/food  Academy of Nutrition and Dietetics: PokerClues.dk  [employer] of Diabetes and Digestive and Kidney Diseases (NIH): ContactWire.be Summary  A healthy meal plan will help you control your Gutierrez glucose and maintain a healthy lifestyle.  Working with a diet and nutrition specialist (dietitian) can help you make a meal plan that is best for you.  Keep in mind that carbohydrates and alcohol have immediate effects on your Gutierrez glucose levels. It is important to count carbohydrates and to use alcohol carefully. This information is not intended to replace advice given to you by your health care provider. Make sure you discuss any questions you have with your health care provider. Document Released: 08/11/2005 Document Revised: 12/19/2016 Document Reviewed: 12/19/2016 Elsevier Interactive Patient Education  [employer].   If you have lab work done today you will be contacted with your lab results within the next 2 weeks.  If you have not heard from [name] then please contact us. The fastest way to get your results is to  register for My Chart.   IF you received an x-Gutierrez today, you will receive an invoice from [facility]. Please contact [facility] at [phone] with questions or concerns regarding your invoice.   IF you received labwork today, you will receive an invoice from [place]. Please contact [facility] at [phone] with questions or concerns regarding your invoice.   Our billing staff will not be able to assist you with questions regarding bills from these companies.  You will be contacted with the lab results as soon as they are available. The fastest way to get your results is to activate your My Chart account. Instructions are located on the last page of this paperwork. If you have not heard from [name] regarding the results in 2 weeks, please contact this office.       I personally performed the services described in this documentation, which was scribed in my presence. The recorded information has been reviewed and considered for accuracy and completeness, addended by me as needed, and agree with information above.  Signed,   Dakota Ray, MD [facility] at [place].  10/17/18 1:07 PM

## 2018-10-17 NOTE — Patient Instructions (Addendum)
Restart same dose of blood pressure meds, monitor at home and if running over 140/90 in next 2-3 weeks, return to discuss changes.   Increase metformin to twice per day. If diarrhea at that dose, return to discuss other options. Repeat tests in 3 months.   Return to the clinic or go to the nearest emergency room if any of your symptoms worsen or new symptoms occur.   Type 2 Diabetes Mellitus, Self Care, Adult When you have type 2 diabetes (type 2 diabetes mellitus), you must keep your blood sugar (glucose) under control. You can do this with:  Nutrition.  Exercise.  Lifestyle changes.  Medicines or insulin, if needed.  Support from your doctors and others.  How do I manage my blood sugar?  Check your blood sugar level every day, as often as told.  Call your doctor if your blood sugar is above your goal numbers for 2 tests in a row.  Have your A1c (hemoglobin A1c) level checked at least two times a year. Have it checked more often if your doctor tells you to. Your doctor will set treatment goals for you. Generally, you should have these blood sugar levels:  Before meals (preprandial): 80-130 mg/dL (4.4-7.2 mmol/L).  After meals (postprandial): lower than 180 mg/dL (10 mmol/L).  A1c level: less than 7%.  What do I need to know about high blood sugar? High blood sugar is called hyperglycemia. Know the signs of high blood sugar. Signs may include:  Feeling: ? Thirsty. ? Hungry. ? Very tired.  Needing to pee (urinate) more than usual.  Blurry vision.  What do I need to know about low blood sugar? Low blood sugar is called hypoglycemia. This is when blood sugar is at or below 70 mg/dL (3.9 mmol/L). Symptoms may include:  Feeling: ? Hungry. ? Worried or nervous (anxious). ? Sweaty and clammy. ? Confused. ? Dizzy. ? Sleepy. ? Sick to your stomach (nauseous).  Having: ? A fast heartbeat (palpitations). ? A headache. ? A change in your vision. ? Jerky movements  that you cannot control (seizure). ? Nightmares. ? Tingling or no feeling (numbness) around the mouth, lips, or tongue.  Having trouble with: ? Talking. ? Paying attention (concentrating). ? Moving (coordination). ? Sleeping.  Shaking.  Passing out (fainting).  Getting upset easily (irritability).  Treating low blood sugar  To treat low blood sugar, eat or drink something sugary right away. If you can think clearly and swallow safely, follow the 15:15 rule:  Take 15 grams of a fast-acting carb (carbohydrate). Some fast-acting carbs are: ? 1 tube of glucose gel. ? 3 sugar tablets (glucose pills). ? 6-8 pieces of hard candy. ? 4 oz (120 mL) of fruit juice. ? 4 oz (120 mL) regular (not diet) soda.  Check your blood sugar 15 minutes after you take the carb.  If your blood sugar is still at or below 70 mg/dL (3.9 mmol/L), take 15 grams of a carb again.  If your blood sugar does not go above 70 mg/dL (3.9 mmol/L) after 3 tries, get help right away.  After your blood sugar goes back to normal, eat a meal or a snack within 1 hour.  Treating very low blood sugar If your blood sugar is at or below 54 mg/dL (3 mmol/L), you have very low blood sugar (severe hypoglycemia). This is an emergency. Do not wait to see if the symptoms will go away. Get medical help right away. Call your local emergency services (911 in the [place]  Do not drive yourself to the hospital. If you have very low blood sugar and you cannot eat or drink, you may need a glucagon shot (injection). A family member or friend should learn how to check your blood sugar and how to give you a glucagon shot. Ask your doctor if you need to have a glucagon shot kit at home. What else is important to manage my diabetes? Medicine Follow these instructions about insulin and diabetes medicines:  Take them as told by your doctor.  Adjust them as told by your doctor.  Do not run out of them.  Having diabetes can raise your risk  for other long-term conditions. These include heart or kidney disease. Your doctor may prescribe medicines to help prevent problems from diabetes. Food   Make healthy food choices. These include: ? Chicken, fish, egg whites, and beans. ? Oats, whole wheat, bulgur, brown rice, quinoa, and millet. ? Fresh fruits and vegetables. ? Low-fat dairy products. ? Nuts, avocado, olive oil, and canola oil.  Make a food plan with a specialist (dietitian).  Follow instructions from your doctor about what you cannot eat or drink.  Drink enough fluid to keep your pee (urine) clear or pale yellow.  Eat healthy snacks between healthy meals.  Keep track of carbs that you eat. Read food labels. Learn food serving sizes.  Follow your sick day plan when you cannot eat or drink normally. Make this plan with your doctor so it is ready to use. Activity  Exercise at least 3 times a week.  Do not go more than 2 days without exercising.  Talk with your doctor before you start a new exercise. Your doctor may need to adjust your insulin, medicines, or food. Lifestyle   Do not use any tobacco products. These include cigarettes, chewing tobacco, and e-cigarettes.If you need help quitting, ask your doctor.  Ask your doctor how much alcohol is safe for you.  Learn to deal with stress. If you need help with this, ask your doctor. Body care  Stay up to date with your shots (immunizations).  Have your eyes and feet checked by a doctor as often as told.  Check your skin and feet every day. Check for cuts, bruises, redness, blisters, or sores.  Brush your teeth and gums two times a day.  Floss at least one time a day.  Go to the dentist least one time every 6 months.  Stay at a healthy weight. General instructions   Take over-the-counter and prescription medicines only as told by your doctor.  Share your diabetes care plan with: ? Your work or school. ? People you live with.  Check your pee  (urine) for ketones: ? When you are sick. ? As told by your doctor.  Carry a card or wear jewelry that says that you have diabetes.  Ask your doctor: ? Do I need to meet with a diabetes educator? ? Where can I find a support group for people with diabetes?  Keep all follow-up visits as told by your doctor. This is important. Where to find more information: To learn more about diabetes, visit:  American Diabetes Association: www.diabetes.org  American Association of Diabetes Educators: www.diabeteseducator.org/patient-resources  This information is not intended to replace advice given to you by your health care provider. Make sure you discuss any questions you have with your health care provider. Document Released: 03/07/2016 Document Revised: 04/21/2016 Document Reviewed: 12/18/2015 Elsevier Interactive Patient Education  2018 [employer].  Diabetes Mellitus and Nutrition  When you have diabetes (diabetes mellitus), it is very important to have healthy eating habits because your blood sugar (glucose) levels are greatly affected by what you eat and drink. Eating healthy foods in the appropriate amounts, at about the same times every day, can help you:  Control your blood glucose.  Lower your risk of heart disease.  Improve your blood pressure.  Reach or maintain a healthy weight.  Every person with diabetes is different, and each person has different needs for a meal plan. Your health care provider may recommend that you work with a diet and nutrition specialist (dietitian) to make a meal plan that is best for you. Your meal plan may vary depending on factors such as:  The calories you need.  The medicines you take.  Your weight.  Your blood glucose, blood pressure, and cholesterol levels.  Your activity level.  Other health conditions you have, such as heart or kidney disease.  How do carbohydrates affect me? Carbohydrates affect your blood glucose level more than any  other type of food. Eating carbohydrates naturally increases the amount of glucose in your blood. Carbohydrate counting is a method for keeping track of how many carbohydrates you eat. Counting carbohydrates is important to keep your blood glucose at a healthy level, especially if you use insulin or take certain oral diabetes medicines. It is important to know how many carbohydrates you can safely have in each meal. This is different for every person. Your dietitian can help you calculate how many carbohydrates you should have at each meal and for snack. Foods that contain carbohydrates include:  Bread, cereal, rice, pasta, and crackers.  Potatoes and corn.  Peas, beans, and lentils.  Milk and yogurt.  Fruit and juice.  Desserts, such as cakes, cookies, ice cream, and candy.  How does alcohol affect me? Alcohol can cause a sudden decrease in blood glucose (hypoglycemia), especially if you use insulin or take certain oral diabetes medicines. Hypoglycemia can be a life-threatening condition. Symptoms of hypoglycemia (sleepiness, dizziness, and confusion) are similar to symptoms of having too much alcohol. If your health care provider says that alcohol is safe for you, follow these guidelines:  Limit alcohol intake to no more than 1 drink per day for nonpregnant women and 2 drinks per day for men. One drink equals 12 oz of beer, 5 oz of wine, or 1 oz of hard liquor.  Do not drink on an empty stomach.  Keep yourself hydrated with water, diet soda, or unsweetened iced tea.  Keep in mind that regular soda, juice, and other mixers may contain a lot of sugar and must be counted as carbohydrates.  What are tips for following this plan? Reading food labels  Start by checking the serving size on the label. The amount of calories, carbohydrates, fats, and other nutrients listed on the label are based on one serving of the food. Many foods contain more than one serving per package.  Check the  total grams (g) of carbohydrates in one serving. You can calculate the number of servings of carbohydrates in one serving by dividing the total carbohydrates by 15. For example, if a food has 30 g of total carbohydrates, it would be equal to 2 servings of carbohydrates.  Check the number of grams (g) of saturated and trans fats in one serving. Choose foods that have low or no amount of these fats.  Check the number of milligrams (mg) of sodium in one serving. Most people should limit total  sodium intake to less than 2,300 mg per day.  Always check the nutrition information of foods labeled as "low-fat" or "nonfat". These foods may be higher in added sugar or refined carbohydrates and should be avoided.  Talk to your dietitian to identify your daily goals for nutrients listed on the label. Shopping  Avoid buying canned, premade, or processed foods. These foods tend to be high in fat, sodium, and added sugar.  Shop around the outside edge of the grocery store. This includes fresh fruits and vegetables, bulk grains, fresh meats, and fresh dairy. Cooking  Use low-heat cooking methods, such as baking, instead of high-heat cooking methods like deep frying.  Cook using healthy oils, such as olive, canola, or sunflower oil.  Avoid cooking with butter, cream, or high-fat meats. Meal planning  Eat meals and snacks regularly, preferably at the same times every day. Avoid going long periods of time without eating.  Eat foods high in fiber, such as fresh fruits, vegetables, beans, and whole grains. Talk to your dietitian about how many servings of carbohydrates you can eat at each meal.  Eat 4-6 ounces of lean protein each day, such as lean meat, chicken, fish, eggs, or tofu. 1 ounce is equal to 1 ounce of meat, chicken, or fish, 1 egg, or 1/4 cup of tofu.  Eat some foods each day that contain healthy fats, such as avocado, nuts, seeds, and fish. Lifestyle   Check your blood glucose  regularly.  Exercise at least 30 minutes 5 or more days each week, or as told by your health care provider.  Take medicines as told by your health care provider.  Do not use any products that contain nicotine or tobacco, such as cigarettes and e-cigarettes. If you need help quitting, ask your health care provider.  Work with a Social worker or diabetes educator to identify strategies to manage stress and any emotional and social challenges. What are some questions to ask my health care provider?  Do I need to meet with a diabetes educator?  Do I need to meet with a dietitian?  What number can I call if I have questions?  When are the best times to check my blood glucose? Where to find more information:  American Diabetes Association: diabetes.org/food-and-fitness/food  Academy of Nutrition and Dietetics: PokerClues.dk  [employer] of Diabetes and Digestive and Kidney Diseases (NIH): ContactWire.be Summary  A healthy meal plan will help you control your blood glucose and maintain a healthy lifestyle.  Working with a diet and nutrition specialist (dietitian) can help you make a meal plan that is best for you.  Keep in mind that carbohydrates and alcohol have immediate effects on your blood glucose levels. It is important to count carbohydrates and to use alcohol carefully. This information is not intended to replace advice given to you by your health care provider. Make sure you discuss any questions you have with your health care provider. Document Released: 08/11/2005 Document Revised: 12/19/2016 Document Reviewed: 12/19/2016 Elsevier Interactive Patient Education  [employer].   If you have lab work done today you will be contacted with your lab results within the next 2 weeks.  If you have not heard from [name] then please contact us. The fastest way  to get your results is to register for My Chart.   IF you received an x-ray today, you will receive an invoice from [facility]. Please contact [facility] at [phone] with questions or concerns regarding your invoice.   IF you  received labwork today, you will receive an invoice from [place]. Please contact [facility] at [phone] with questions or concerns regarding your invoice.   Our billing staff will not be able to assist you with questions regarding bills from these companies.  You will be contacted with the lab results as soon as they are available. The fastest way to get your results is to activate your My Chart account. Instructions are located on the last page of this paperwork. If you have not heard from [name] regarding the results in 2 weeks, please contact this office.

## 2018-10-18 LAB — MICROALBUMIN / CREATININE URINE RATIO
Creatinine, Urine: 98.4 mg/dL
MICROALB/CREAT RATIO: 12.4 mg/g{creat} (ref 0.0–30.0)
Microalbumin, Urine: 12.2 ug/mL

## 2018-10-30 ENCOUNTER — Ambulatory Visit (INDEPENDENT_AMBULATORY_CARE_PROVIDER_SITE_OTHER): Payer: Medicare Other

## 2018-10-30 DIAGNOSIS — I428 Other cardiomyopathies: Secondary | ICD-10-CM

## 2018-10-30 NOTE — Progress Notes (Signed)
Remote ICD transmission.   

## 2018-11-06 ENCOUNTER — Telehealth: Payer: Self-pay | Admitting: *Deleted

## 2018-11-06 ENCOUNTER — Encounter: Payer: Self-pay | Admitting: Cardiology

## 2018-11-06 NOTE — Telephone Encounter (Signed)
Patient would like an alternative for losartan called in.

## 2018-11-06 NOTE — Progress Notes (Signed)
Letter  

## 2018-11-07 ENCOUNTER — Other Ambulatory Visit: Payer: Self-pay

## 2018-11-07 NOTE — Telephone Encounter (Signed)
Dakota Gutierrez, pharmacy tech with [facility] called and said that the patient is currently out of losartan-hydrochlorothiazide (HYZAAR) 100-12.5 MG tablet and the pharmacy has been trying to reach the office for something else to be called in place of it due to it being on back order. Please Advise. Thanks. Please call back @ [phone]

## 2018-11-07 NOTE — Patient Outreach (Signed)
[facility]) Care Management  11/07/2018  Dakota Gutierrez 1951/08/08 [id number]   Medication Adherence call to Dakota Gutierrez spoke with patient he is due on Losartan/ HCTZ 100/12.5 mg he said he had already order through [facility] but they told him that the medication is on back order and will call doctor for a substitute, Patient does not have any medication at this time and needs it.left a message at doctor's office to call a new prescription for the patient.Dakota Gutierrez is showing past due under [facility].   [facility] Management Direct Dial [phone]  Fax [phone] [email or web address]

## 2018-11-08 MED ORDER — OLMESARTAN MEDOXOMIL-HCTZ 20-12.5 MG PO TABS: 1.0000 | ORAL_TABLET | Freq: Every day | ORAL | 1 refills | Status: DC

## 2018-11-08 NOTE — Telephone Encounter (Signed)
I sent in Benicar HCT for the time being to [facility].  Once losartan is off backorder, I can change back to previous med.  Monitor blood pressure on new medication as may need stronger dose..  Let me know if there are questions.

## 2018-11-08 NOTE — Addendum Note (Signed)
Addended by: [name] R on: 11/08/2018 01:52 PM   Modules accepted: Orders

## 2018-11-16 ENCOUNTER — Other Ambulatory Visit: Payer: Self-pay | Admitting: Internal Medicine

## 2018-11-28 LAB — CUP PACEART REMOTE DEVICE CHECK
Brady Statistic AP VP Percent: 13.95 %
Brady Statistic AP VS Percent: 74.05 %
Brady Statistic AS VP Percent: 0.02 %
Brady Statistic AS VS Percent: 11.97 %
Date Time Interrogation Session: 20191203102704
HighPow Impedance: 39 Ohm
HighPow Impedance: 48 Ohm
Implantable Lead Implant Date: 20120531
Implantable Lead Implant Date: 20120531
Implantable Lead Location: 753859
Implantable Lead Location: 753860
Implantable Lead Model: 6947
Lead Channel Impedance Value: 399 Ohm
Lead Channel Pacing Threshold Pulse Width: 0.4 ms
Lead Channel Sensing Intrinsic Amplitude: 13.375 mV
Lead Channel Sensing Intrinsic Amplitude: 2.375 mV
Lead Channel Sensing Intrinsic Amplitude: 2.375 mV
Lead Channel Setting Pacing Pulse Width: 0.4 ms
MDC IDC MSMT BATTERY REMAINING LONGEVITY: 94 mo
MDC IDC MSMT BATTERY VOLTAGE: 2.99 V
MDC IDC MSMT LEADCHNL RA PACING THRESHOLD AMPLITUDE: 0.375 V
MDC IDC MSMT LEADCHNL RA PACING THRESHOLD PULSEWIDTH: 0.4 ms
MDC IDC MSMT LEADCHNL RV IMPEDANCE VALUE: 342 Ohm
MDC IDC MSMT LEADCHNL RV IMPEDANCE VALUE: 456 Ohm
MDC IDC MSMT LEADCHNL RV PACING THRESHOLD AMPLITUDE: 0.5 V
MDC IDC MSMT LEADCHNL RV SENSING INTR AMPL: 13.375 mV
MDC IDC PG IMPLANT DT: 20180530
MDC IDC SET LEADCHNL RA PACING AMPLITUDE: 1.5 V
MDC IDC SET LEADCHNL RV PACING AMPLITUDE: 2 V
MDC IDC SET LEADCHNL RV SENSING SENSITIVITY: 0.3 mV
MDC IDC STAT BRADY RA PERCENT PACED: 86 %
MDC IDC STAT BRADY RV PERCENT PACED: 13.45 %

## 2018-12-03 ENCOUNTER — Other Ambulatory Visit: Payer: Self-pay | Admitting: Family Medicine

## 2018-12-04 NOTE — Telephone Encounter (Signed)
Requested Prescriptions  Pending Prescriptions Disp Refills  . olmesartan-hydrochlorothiazide (BENICAR HCT) 20-12.5 MG tablet [Pharmacy Med Name: OLMESARTAN-HCTZ 20-12.5 MG TAB] 30 tablet 1    Sig: TAKE 1 TABLET BY MOUTH EVERY DAY     Cardiovascular: ARB + Diuretic Combos Failed - 12/03/2018 10:34 AM      Failed - Last BP in normal range    BP Readings from Last 1 Encounters:  10/17/18 (!) 150/80         Passed - K in normal range and within 180 days    Potassium  Date Value Ref Range Status  10/17/2018 4.9 3.5 - 5.2 mmol/L Final         Passed - Na in normal range and within 180 days    Sodium  Date Value Ref Range Status  10/17/2018 138 134 - 144 mmol/L Final         Passed - Cr in normal range and within 180 days    Creat  Date Value Ref Range Status  09/15/2016 1.19 0.70 - 1.25 mg/dL Final    Comment:      For patients > or = 70 years of age: The upper reference limit for Creatinine is approximately 13% higher for people identified as African-American.      Creatinine, Ser  Date Value Ref Range Status  10/17/2018 0.77 0.76 - 1.27 mg/dL Final         Passed - Ca in normal range and within 180 days    Calcium  Date Value Ref Range Status  10/17/2018 9.4 8.6 - 10.2 mg/dL Final         Passed - Patient is not pregnant      Passed - Valid encounter within last 6 months    Recent Outpatient Visits          1 month ago Type 2 diabetes mellitus with hyperglycemia, without long-term current use of insulin [facility])   [facility] [place], Ranell Patrick, MD   1 year ago Type 2 diabetes mellitus without complication, without long-term current use of insulin [facility])   [facility] [place], Ranell Patrick, MD   1 year ago Type 2 diabetes mellitus without complication, without long-term current use of insulin [facility])   [facility] [place], Ranell Patrick, MD   1 year ago Type 2 diabetes mellitus without complication, without long-term current use of insulin [facility])    [facility] [place], Ranell Patrick, MD   2 years ago Controlled type 2 diabetes mellitus without complication, without long-term current use of insulin [facility])   [facility] at [place], MD      Future Appointments            In 3 weeks  [facility] at [place], [facility]   In 1 month Carlota Raspberry Ranell Patrick, MD Primary Care at [place], [facility]

## 2018-12-27 ENCOUNTER — Ambulatory Visit: Payer: Medicare Other

## 2018-12-27 ENCOUNTER — Ambulatory Visit (INDEPENDENT_AMBULATORY_CARE_PROVIDER_SITE_OTHER): Payer: Medicare Other | Admitting: Family Medicine

## 2018-12-27 ENCOUNTER — Telehealth: Payer: Self-pay | Admitting: *Deleted

## 2018-12-27 VITALS — BP 129/70 | Ht 65.5 in | Wt 195.8 lb

## 2018-12-27 DIAGNOSIS — Z0000 Encounter for general adult medical examination without abnormal findings: Secondary | ICD-10-CM | POA: Diagnosis not present

## 2018-12-27 NOTE — Patient Instructions (Signed)
Thank you for taking time to come for your Medicare Wellness Visit. I appreciate your ongoing commitment to your health goals. Please review the following plan we discussed and let me know if I can assist you in the future.  Leroy Kennedy LPN

## 2018-12-27 NOTE — Progress Notes (Signed)
Presents today for Medicare Annual Wellness Visit    Patient Care Team: Dakota Agreste, MD as PCP - General (Family Medicine) [name], MD (Cardiology) [name], MD as Attending Physician (Neurology)     Immunization status:  Immunization History  Administered Date(s) Administered  . Influenza, High Dose Seasonal PF 10/17/2018  . Influenza,inj,Quad PF,6+ Mos 09/19/2013, 10/28/2014, 08/17/2015, 09/15/2016, 10/12/2017  . Pneumococcal Conjugate-13 10/28/2014  . Pneumococcal Polysaccharide-23 12/22/2016  . Tdap 03/21/2013  . Zoster 12/23/2013     There are no preventive care reminders to display for this patient.   Functional Status Survey: Does the patient have difficulty seeing, even when wearing glasses/contacts?: No Does the patient have difficulty concentrating, remembering, or making decisions?: No Does the patient have difficulty walking or climbing stairs?: No Does the patient have difficulty dressing or bathing?: No Does the patient have difficulty doing errands alone such as visiting a doctor's office or shopping?: No   6CIT Screen 12/27/2018 12/26/2017  What Year? 0 points 0 points  What month? 0 points 0 points  What time? 0 points 0 points  Count back from 20 0 points 0 points  Months in reverse 0 points 0 points  Repeat phrase 0 points 2 points  Total Score 0 2        Clinical Support from 12/27/2018 in Primary Care at [facility]  AUDIT-C Score  3         Patient Active Problem List   Diagnosis Date Noted  . Type II diabetes mellitus ([place]) 01/16/2015  . Hypersomnolence 08/14/2012  . History of Helicobacter pylori infection 04/16/2012  . GERD (gastroesophageal reflux disease) 04/16/2012  . Elevated transaminase level 02/02/2012  . Obesity 12/29/2011  . 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 Syncope   . Ventricular tachycardia ([place])   . Anxiety      Past Medical History:  Diagnosis Date  .  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 Diabetes mellitus without complication ([place])   . Dual implantable cardiac defibrillator in situ    Medtronic D314DRG Protecta  . Elevated transaminase level    10/2011  ? amio  . GERD (gastroesophageal reflux disease)   . HTN (hypertension)   . Other primary cardiomyopathies   . Syncope   . Tubular adenoma of colon   . Ventricular tachycardia ([place])    storm following ICD implant on amiodarone      Past Surgical History:  Procedure Laterality Date  . CARDIAC DEFIBRILLATOR PLACEMENT     Medtronic D314DRG Protecta  . CATARACT EXTRACTION, BILATERAL  2019  . CHOLECYSTECTOMY    . COLONOSCOPY WITH PROPOFOL N/A 06/30/2017   Procedure: COLONOSCOPY WITH PROPOFOL;  Surgeon: Carol Ada, MD;  Location: [facility];  Service: Endoscopy;  Laterality: N/A;  . GALLBLADDER SURGERY    . HERNIA REPAIR    . ICD GENERATOR CHANGEOUT N/A 04/26/2017   Procedure: ICD Generator Changeout;  Surgeon: [name], MD;  Location: [place] [facility];  Service: Cardiovascular;  Laterality: N/A;  . TONSILLECTOMY AND ADENOIDECTOMY       Family History  Problem Relation Age of Onset  . Hyperlipidemia Mother   . Heart disease Father   . Colon cancer Neg Hx   . Esophageal cancer Neg Hx   . Stomach cancer Neg Hx   . Rectal cancer Neg Hx      Social History   Socioeconomic History  . Marital status: Married    Spouse  name: Not on file  . Number of children: 0  . Years of education: Not on file  . Highest education level: Bachelor's degree (e.g., BA, AB, BS)  Occupational History  . Occupation: Retired Tour manager  . Financial resource strain: Not hard at all  . Food insecurity:    Worry: Never true    Inability: Never true  . Transportation needs:    Medical: No    Non-medical: No  Tobacco Use  . Smoking status: Never Smoker  . Smokeless tobacco: Never Used  Substance and Sexual Activity  . Alcohol use: Yes    Alcohol/week: 2.0 standard  drinks    Types: 2 Cans of beer per week    Comment: 4/week  . Drug use: No  . Sexual activity: Yes  Lifestyle  . Physical activity:    Days per week: 4 days    Minutes per session: 50 min  . Stress: Not at all  Relationships  . Social connections:    Talks on phone: More than three times a week    Gets together: More than three times a week    Attends religious service: More than 4 times per year    Active member of club or organization: No    Attends meetings of clubs or organizations: Never    Relationship status: Married  . Intimate partner violence:    Fear of current or ex partner: No    Emotionally abused: No    Physically abused: No    Forced sexual activity: No  Other Topics Concern  . Not on file  Social History Narrative   Divorced. Education: [employer]. Exercise: walking 3 times a week for 30 minutes.   Retired Pharmacist, hospital     Allergies  Allergen Reactions  . Adhesive [Tape]     Swelling and welts formed     Prior to Admission medications   Medication Sig Start Date End Date Taking? Authorizing Provider  acetaminophen (TYLENOL) 500 MG tablet Take 1,000 mg by mouth daily as needed for moderate pain or headache.   Yes [provider]  amiodarone (PACERONE) 200 MG tablet TAKE 1/2 (ONE-HALF) TABLET BY MOUTH ONCE DAILY 11/16/18  Yes [name], MD  aspirin EC 81 MG tablet Take 1 tablet (81 mg total) by mouth daily. 11/11/11  Yes [name], MD  atorvastatin (LIPITOR) 10 MG tablet Take 1 tablet (10 mg total) by mouth daily. 10/17/18  Yes Dakota Agreste, MD  Cholecalciferol (VITAMIN D) 2000 UNITS tablet Take 2,000 Units by mouth daily.   Yes [provider]  famotidine (PEPCID) 20 MG tablet Take 1 tablet (20 mg total) by mouth 2 (two) times daily as needed. 04/16/12  Yes Pyrtle, Lajuan Lines, MD  metFORMIN (GLUCOPHAGE) 500 MG tablet Take 1 tablet (500 mg total) by mouth 2 (two) times daily with a meal. 10/17/18  Yes Dakota Agreste, MD    metoprolol succinate (TOPROL-XL) 100 MG 24 hr tablet TAKE 1 TABLET BY MOUTH ONCE DAILY IMMEDIATELY  FOLLOWING  A  MEAL 09/19/18  Yes [name], MD  olmesartan-hydrochlorothiazide (BENICAR HCT) 20-12.5 MG tablet TAKE 1 TABLET BY MOUTH EVERY DAY 12/04/18  Yes Dakota Agreste, MD  Omega-3 Fatty Acids (FISH OIL) 1000 MG CAPS Take 1,000 mg by mouth 2 (two) times daily.    Yes [provider]  OVER THE COUNTER MEDICATION    Yes [provider]  tadalafil (CIALIS) 20 MG tablet Take 0.5-1 tablets (10-20  mg total) by mouth daily as needed for erectile dysfunction. 09/15/16  Yes Dakota Agreste, MD     Depression screen [facility] 2/9 12/27/2018 10/17/2018 12/26/2017 10/12/2017 03/30/2017  Decreased Interest 0 0 0 0 0  Down, Depressed, Hopeless - 0 0 0 0  PHQ - 2 Score 0 0 0 0 0     Fall Risk  12/27/2018 10/17/2018 09/04/2018 12/26/2017 10/12/2017  Falls in the past year? 0 0 No No No  Number falls in past yr: 0 - - - -  Injury with Fall? 0 - - - -  Follow up Education provided;Falls evaluation completed;Falls prevention discussed - - - -      PHYSICAL EXAM: BP 129/70   Ht 5' 5.5" (1.664 m)   Wt 195 lb 12.8 oz (88.8 kg)   BMI 32.09 kg/m    Wt Readings from Last 3 Encounters:  12/27/18 195 lb 12.8 oz (88.8 kg)  10/17/18 194 lb (88 kg)  09/04/18 193 lb (87.5 kg)     No exam data present    Physical Exam   Education/Counseling provided regarding diet and exercise, prevention of chronic diseases, smoking/tobacco cessation, if applicable, and reviewed "Covered Medicare Preventive Services."   ASSESSMENT/PLAN: 1. Medicare welcome exam    Nutritional Status: BMI 25 -29 Overweight Nutritional Risks: Nausea/ vomitting/ diarrhea(Patient has issues with diarrhea and is taking medication for it. ) Diabetes: Yes CBG done?: No Did pt. bring in CBG monitor from home?: No  How often do you need to have someone help you when you read instructions, pamphlets, or other  written materials from your doctor or pharmacy?: 1 - Never What is the last grade level you completed in school?: Bachelor's Degree  Hearing/Vision screen Hearing Screening Comments: Patient has not had a recent hearing exam.  Vision Screening Comments: Patient sees his eye doctor once a year for his routine eye exams. Dietary issues and exercise activities discussed:  Current Exercise Habits: Home exercise routine, Type of exercise: walking, Time (Minutes): 50, Frequency (Times/Week): 4, Weekly Exercise (Minutes/Week): 200, Intensity: Mild, Exercise limited by: None identified   Is the patient's home free of loose throw rugs in walkways, pet beds, electrical cords, etc?   yes      Grab bars in the bathroom? No      Handrails on the stairs?   yes      Adequate lighting?   yes  Timed Get Up and Go performed: yes, completed within 30 seconds

## 2018-12-28 NOTE — Telephone Encounter (Signed)
Patient advised.

## 2019-01-03 ENCOUNTER — Other Ambulatory Visit: Payer: Self-pay | Admitting: *Deleted

## 2019-01-03 ENCOUNTER — Other Ambulatory Visit: Payer: Self-pay | Admitting: Family Medicine

## 2019-01-03 DIAGNOSIS — E1165 Type 2 diabetes mellitus with hyperglycemia: Secondary | ICD-10-CM

## 2019-01-03 MED ORDER — METFORMIN HCL 500 MG PO TABS: 500.0000 mg | ORAL_TABLET | Freq: Two times a day (BID) | ORAL | 1 refills | Status: DC

## 2019-01-17 ENCOUNTER — Ambulatory Visit: Payer: Medicare Other | Admitting: Family Medicine

## 2019-01-22 ENCOUNTER — Encounter: Payer: Self-pay | Admitting: Family Medicine

## 2019-01-22 NOTE — Progress Notes (Signed)
Note reviewed, and agree with documentation and plan of care.   Signed,   [name], MD Primary Care at [place].  01/22/19 1:49 PM

## 2019-01-23 ENCOUNTER — Ambulatory Visit: Payer: Medicare Other | Admitting: Family Medicine

## 2019-01-23 ENCOUNTER — Encounter: Payer: Self-pay | Admitting: Family Medicine

## 2019-01-23 ENCOUNTER — Other Ambulatory Visit: Payer: Self-pay

## 2019-01-23 VITALS — BP 143/82 | HR 109 | Temp 98.9°F | Resp 18 | Ht 65.5 in | Wt 192.6 lb

## 2019-01-23 DIAGNOSIS — E785 Hyperlipidemia, unspecified: Secondary | ICD-10-CM | POA: Diagnosis not present

## 2019-01-23 DIAGNOSIS — E1165 Type 2 diabetes mellitus with hyperglycemia: Secondary | ICD-10-CM

## 2019-01-23 DIAGNOSIS — I10 Essential (primary) hypertension: Secondary | ICD-10-CM | POA: Diagnosis not present

## 2019-01-23 LAB — POCT GLYCOSYLATED HEMOGLOBIN (HGB A1C): Hemoglobin A1C: 9.8 % — AB (ref 4.0–5.6)

## 2019-01-23 MED ORDER — METFORMIN HCL 500 MG PO TABS: 500.0000 mg | ORAL_TABLET | Freq: Two times a day (BID) | ORAL | 1 refills | Status: DC

## 2019-01-23 MED ORDER — ATORVASTATIN CALCIUM 10 MG PO TABS: 10.0000 mg | ORAL_TABLET | Freq: Every day | ORAL | 1 refills | Status: DC

## 2019-01-23 MED ORDER — OLMESARTAN MEDOXOMIL-HCTZ 20-12.5 MG PO TABS: 1.0000 | ORAL_TABLET | Freq: Every day | ORAL | 1 refills | Status: DC

## 2019-01-23 NOTE — Progress Notes (Signed)
Subjective:    Patient ID: Dakota Gutierrez, male    DOB: 05-24-1951, 68 y.o.   MRN: [id number]  HPI Dakota Gutierrez is a 68 y.o. male Presents today for: Chief Complaint  Patient presents with  . Diabetes    follow up   . Medication Refill    wants to know if he can switch back to losartan    Diabetes:  Microalbumin:nl in Nov 2019.  Optho, foot exam, pneumovax: Up-to-date.  On ARB and statin.  On metformin 500 mg twice daily since increase in November. No new side effects.  House call visit  - A1c below 7 in December. 6.4 on 11/05/18 Home readings 160-170.   Lab Results  Component Value Date   HGBA1C 8.1 (A) 10/17/2018   HGBA1C 8.0 (H) 10/12/2017   HGBA1C 6.6 (H) 03/30/2017   Lab Results  Component Value Date   MICROALBUR 0.7 09/15/2016   LDLCALC 82 10/17/2018   CREATININE 0.77 10/17/2018   Hypertension: BP Readings from Last 3 Encounters:  01/23/19 (!) 143/82  12/27/18 129/70  10/17/18 (!) 150/80   Lab Results  Component Value Date   CREATININE 0.77 10/17/2018  Changed previously from losartan to Benicar HCT 20/12.5 mg daily due to backorder.  Has remained on Toprol-XL 100 mg daily, amiodarone 100 mg daily (hx arhythmia, vtach, has ICD). No new side effects on benicar. Home numbers variable. Seems better after walking.   Hyperlipidemia:  Lab Results  Component Value Date   CHOL 172 10/17/2018   HDL 79 10/17/2018   LDLCALC 82 10/17/2018   LDLDIRECT 90.5 08/06/2013   TRIG 55 10/17/2018   CHOLHDL 2.2 10/17/2018   Lab Results  Component Value Date   ALT 12 10/17/2018   AST 17 10/17/2018   ALKPHOS 55 10/17/2018   BILITOT 0.3 10/17/2018  Lipitor 10 mg daily.  Exercise: 4-5 days/week. Walking. Short and long routes. Has been losing weight intentionally.  Wt Readings from Last 3 Encounters:  01/23/19 192 lb 9.6 oz (87.4 kg)  12/27/18 195 lb 12.8 oz (88.8 kg)  10/17/18 194 lb (88 kg)       Patient Active Problem List   Diagnosis Date Noted    . Type II diabetes mellitus ([place]) 01/16/2015  . Hypersomnolence 08/14/2012  . History of Helicobacter pylori infection 04/16/2012  . GERD (gastroesophageal reflux disease) 04/16/2012  . Elevated transaminase level 02/02/2012  . Obesity 12/29/2011  . 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 Syncope   . Ventricular tachycardia ([place])   . Anxiety    Past Medical History:  Diagnosis Date  . 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 Diabetes mellitus without complication ([place])   . Dual implantable cardiac defibrillator in situ    Medtronic D314DRG Protecta  . Elevated transaminase level    10/2011  ? amio  . GERD (gastroesophageal reflux disease)   . HTN (hypertension)   . Other primary cardiomyopathies   . Syncope   . Tubular adenoma of colon   . Ventricular tachycardia ([place])    storm following ICD implant on amiodarone    Past Surgical History:  Procedure Laterality Date  . CARDIAC DEFIBRILLATOR PLACEMENT     Medtronic D314DRG Protecta  . CATARACT EXTRACTION, BILATERAL  2019  . CHOLECYSTECTOMY    . COLONOSCOPY WITH PROPOFOL N/A 06/30/2017   Procedure: COLONOSCOPY WITH PROPOFOL;  Surgeon: Carol Ada, MD;  Location: [facility];  Service: Endoscopy;  Laterality: N/A;  . GALLBLADDER SURGERY    .  HERNIA REPAIR    . ICD GENERATOR CHANGEOUT N/A 04/26/2017   Procedure: ICD Generator Changeout;  Surgeon: [name], MD;  Location: [place] [facility];  Service: Cardiovascular;  Laterality: N/A;  . TONSILLECTOMY AND ADENOIDECTOMY     Allergies  Allergen Reactions  . Adhesive [Tape]     Swelling and welts formed   Prior to Admission medications   Medication Sig Start Date End Date Taking? Authorizing Provider  acetaminophen (TYLENOL) 500 MG tablet Take 1,000 mg by mouth daily as needed for moderate pain or headache.   Yes [provider]  amiodarone (PACERONE) 200 MG tablet TAKE 1/2 (ONE-HALF) TABLET BY MOUTH ONCE DAILY 11/16/18  Yes [name], MD  aspirin EC 81 MG tablet Take 1 tablet (81 mg total) by mouth daily. 11/11/11  Yes [name], MD  atorvastatin (LIPITOR) 10 MG tablet Take 1 tablet (10 mg total) by mouth daily. 10/17/18  Yes Wendie Agreste, MD  Cholecalciferol (VITAMIN D) 2000 UNITS tablet Take 2,000 Units by mouth daily.   Yes [provider]  famotidine (PEPCID) 20 MG tablet Take 1 tablet (20 mg total) by mouth 2 (two) times daily as needed. 04/16/12  Yes Pyrtle, Lajuan Lines, MD  metFORMIN (GLUCOPHAGE) 500 MG tablet Take 1 tablet (500 mg total) by mouth 2 (two) times daily with a meal. 01/03/19  Yes Wendie Agreste, MD  metoprolol succinate (TOPROL-XL) 100 MG 24 hr tablet TAKE 1 TABLET BY MOUTH ONCE DAILY IMMEDIATELY  FOLLOWING  A  MEAL 09/19/18  Yes [name], MD  olmesartan-hydrochlorothiazide (BENICAR HCT) 20-12.5 MG tablet TAKE 1 TABLET BY MOUTH EVERY DAY 12/04/18  Yes Wendie Agreste, MD  Omega-3 Fatty Acids (FISH OIL) 1000 MG CAPS Take 1,000 mg by mouth 2 (two) times daily.    Yes [provider]  OVER THE COUNTER MEDICATION    Yes [provider]  tadalafil (CIALIS) 20 MG tablet Take 0.5-1 tablets (10-20 mg total) by mouth daily as needed for erectile dysfunction. 09/15/16  Yes Wendie Agreste, MD   Social History   Socioeconomic History  . Marital status: Married    Spouse name: Not on file  . Number of children: 0  . Years of education: Not on file  . Highest education level: Bachelor's degree (e.g., BA, AB, BS)  Occupational History  . Occupation: Retired Tour manager  . Financial resource strain: Not hard at all  . Food insecurity:    Worry: Never true    Inability: Never true  . Transportation needs:    Medical: No    Non-medical: No  Tobacco Use  . Smoking status: Never Smoker  . Smokeless tobacco: Never Used  Substance and Sexual Activity  . Alcohol use: Yes    Alcohol/week: 2.0 standard drinks    Types: 2 Cans of beer per week     Comment: 4/week  . Drug use: No  . Sexual activity: Yes  Lifestyle  . Physical activity:    Days per week: 4 days    Minutes per session: 50 min  . Stress: Not at all  Relationships  . Social connections:    Talks on phone: More than three times a week    Gets together: More than three times a week    Attends religious service: More than 4 times per year    Active member of club or organization: No    Attends meetings of clubs or organizations: Never  Relationship status: Married  . Intimate partner violence:    Fear of current or ex partner: No    Emotionally abused: No    Physically abused: No    Forced sexual activity: No  Other Topics Concern  . Not on file  Social History Narrative   Divorced. Education: [employer]. Exercise: walking 3 times a week for 30 minutes.   Retired Pharmacist, hospital    Review of Systems Per HPI.     Objective:   Physical Exam Vitals signs reviewed.  Constitutional:      Appearance: He is well-developed.  HENT:     Head: Normocephalic and atraumatic.  Eyes:     Pupils: Pupils are equal, round, and reactive to light.  Neck:     Vascular: No carotid bruit or JVD.  Cardiovascular:     Rate and Rhythm: Normal rate and regular rhythm.     Heart sounds: Normal heart sounds. No murmur.  Pulmonary:     Effort: Pulmonary effort is normal.     Breath sounds: Normal breath sounds. No rales.  Skin:    General: Skin is warm and dry.  Neurological:     Mental Status: He is alert and oriented to person, place, and time.    Vitals:   01/23/19 1336  BP: (!) 143/82  Pulse: (!) 109  Resp: 18  Temp: 98.9 F (37.2 C)  TempSrc: Oral  SpO2: 95%  Weight: 192 lb 9.6 oz (87.4 kg)  Height: 5' 5.5" (1.664 m)     Results for orders placed or performed in visit on 01/23/19  POCT glycosylated hemoglobin (Hb A1C)  Result Value Ref Range   Hemoglobin A1C 9.8 (A) 4.0 - 5.6 %   HbA1c POC (<> result, manual entry)     HbA1c, POC (prediabetic range)     HbA1c,  POC (controlled diabetic range)         Assessment & Plan:    Dakota Gutierrez is a 68 y.o. male Type 2 diabetes mellitus with hyperglycemia, without long-term current use of insulin ([place]) - Plan: POCT glycosylated hemoglobin (Hb A1C), Hemoglobin A1c, metFORMIN (GLUCOPHAGE) 500 MG tablet  -Point-of-care testing inconsistent  with recent testing.  Send out A1C  -No med changes for now.  Hyperlipidemia, unspecified hyperlipidemia type - Plan: atorvastatin (LIPITOR) 10 MG tablet  -  Stable, tolerating current regimen. Medications refilled. Labs pending as above.   Essential hypertension - Plan: Comprehensive metabolic panel  -  Stable, tolerating current regimen. Medications refilled. Labs pending as above.    Meds ordered this encounter  Medications  . atorvastatin (LIPITOR) 10 MG tablet    Sig: Take 1 tablet (10 mg total) by mouth daily.    Dispense:  90 tablet    Refill:  1  . metFORMIN (GLUCOPHAGE) 500 MG tablet    Sig: Take 1 tablet (500 mg total) by mouth 2 (two) times daily with a meal.    Dispense:  180 tablet    Refill:  1  . olmesartan-hydrochlorothiazide (BENICAR HCT) 20-12.5 MG tablet    Sig: Take 1 tablet by mouth daily.    Dispense:  90 tablet    Refill:  1   Patient Instructions   Blood pressure borderline but no changes for now. If remaining over 140/90 at home - let me know.   I will send out the hemoglobin A1c as the in office test does not seem consistent with your previous testing.  No medication changes for now  Keep up the  good work with exercise for weight loss.   How to Take Your Blood Pressure Blood pressure is a measurement of how strongly your blood is pressing against the walls of your arteries. Arteries are blood vessels that carry blood from your heart throughout your body. Your health care provider takes your blood pressure at each office visit. You can also take your own blood pressure at home with a blood pressure machine. You may need to  take your own blood pressure:  To confirm a diagnosis of high blood pressure (hypertension).  To monitor your blood pressure over time.  To make sure your blood pressure medicine is working. Supplies needed: To take your blood pressure, you will need a blood pressure machine. You can buy a blood pressure machine, or blood pressure monitor, at most drugstores or online. There are several types of home blood pressure monitors. When choosing one, consider the following:  Choose a monitor that has an arm cuff.  Choose a cuff that wraps snugly around your upper arm. You should be able to fit only one finger between your arm and the cuff.  Do not choose a monitor that measures your blood pressure from your wrist or finger. Your health care provider can suggest a reliable monitor that will meet your needs. How to prepare To get the most accurate reading, avoid the following for 30 minutes before you check your blood pressure:  Drinking caffeine.  Drinking alcohol.  Eating.  Smoking.  Exercising. Five minutes before you check your blood pressure:  Empty your bladder.  Sit quietly without talking in a dining chair, rather than in a soft couch or armchair. How to take your blood pressure To check your blood pressure, follow the instructions in the manual that came with your blood pressure monitor. If you have a digital blood pressure monitor, the instructions may be as follows: 1. Sit up straight. 2. Place your feet on the floor. Do not cross your ankles or legs. 3. Rest your left arm at the level of your heart on a table or desk or on the arm of a chair. 4. Pull up your shirt sleeve. 5. Wrap the blood pressure cuff around the upper part of your left arm, 1 inch (2.5 cm) above your elbow. It is best to wrap the cuff around bare skin. 6. Fit the cuff snugly around your arm. You should be able to place only one finger between the cuff and your arm. 7. Position the cord inside the groove  of your elbow. 8. Press the power button. 9. Sit quietly while the cuff inflates and deflates. 10. Read the digital reading on the monitor screen and write it down (record it). 11. Wait 2-3 minutes, then repeat the steps, starting at step 1. What does my blood pressure reading mean? A blood pressure reading consists of a higher number over a lower number. Ideally, your blood pressure should be below 120/80. The first ("top") number is called the systolic pressure. It is a measure of the pressure in your arteries as your heart beats. The second ("bottom") number is called the diastolic pressure. It is a measure of the pressure in your arteries as the heart relaxes. Blood pressure is classified into four stages. The following are the stages for adults who do not have a short-term serious illness or a chronic condition. Systolic pressure and diastolic pressure are measured in a unit called mm Hg. Normal  Systolic pressure: below 076.  Diastolic pressure: below 80. Elevated  Systolic pressure: 837-290.  Diastolic pressure: below 80. Hypertension stage 1  Systolic pressure: 211-155.  Diastolic pressure: 20-80. Hypertension stage 2  Systolic pressure: 223 or above.  Diastolic pressure: 90 or above. You can have prehypertension or hypertension even if only the systolic or only the diastolic number in your reading is higher than normal. Follow these instructions at home:  Check your blood pressure as often as recommended by your health care provider.  Take your monitor to the next appointment with your health care provider to make sure: ? That you are using it correctly. ? That it provides accurate readings.  Be sure you understand what your goal blood pressure numbers are.  Tell your health care provider if you are having any side effects from blood pressure medicine. Contact a health care provider if:  Your blood pressure is consistently high. Get help right away if:  Your  systolic blood pressure is higher than 180.  Your diastolic blood pressure is higher than 110. This information is not intended to replace advice given to you by your health care provider. Make sure you discuss any questions you have with your health care provider. Document Released: 04/22/2016 Document Revised: 09/26/2017 Document Reviewed: 04/22/2016 Elsevier Interactive Patient Education  [employer].    If you have lab work done today you will be contacted with your lab results within the next 2 weeks.  If you have not heard from [name] then please contact us. The fastest way to get your results is to register for My Chart.   IF you received an x-ray today, you will receive an invoice from [facility]. Please contact [facility] at [phone] with questions or concerns regarding your invoice.   IF you received labwork today, you will receive an invoice from [facility]. Please contact [facility] at [phone] with questions or concerns regarding your invoice.   Our billing staff will not be able to assist you with questions regarding bills from these companies.  You will be contacted with the lab results as soon as they are available. The fastest way to get your results is to activate your My Chart account. Instructions are located on the last page of this paperwork. If you have not heard from [name] regarding the results in 2 weeks, please contact this office.      Signed,   [name], MD Primary Care at [place].  01/25/19 9:13 PM

## 2019-01-23 NOTE — Patient Instructions (Addendum)
Blood pressure borderline but no changes for now. If remaining over 140/90 at home - let me know.   I will send out the hemoglobin A1c as the in office test does not seem consistent with your previous testing.  No medication changes for now  Keep up the good work with exercise for weight loss.   How to Take Your Blood Pressure Blood pressure is a measurement of how strongly your blood is pressing against the walls of your arteries. Arteries are blood vessels that carry blood from your heart throughout your body. Your health care provider takes your blood pressure at each office visit. You can also take your own blood pressure at home with a blood pressure machine. You may need to take your own blood pressure:  To confirm a diagnosis of high blood pressure (hypertension).  To monitor your blood pressure over time.  To make sure your blood pressure medicine is working. Supplies needed: To take your blood pressure, you will need a blood pressure machine. You can buy a blood pressure machine, or blood pressure monitor, at most drugstores or online. There are several types of home blood pressure monitors. When choosing one, consider the following:  Choose a monitor that has an arm cuff.  Choose a cuff that wraps snugly around your upper arm. You should be able to fit only one finger between your arm and the cuff.  Do not choose a monitor that measures your blood pressure from your wrist or finger. Your health care provider can suggest a reliable monitor that will meet your needs. How to prepare To get the most accurate reading, avoid the following for 30 minutes before you check your blood pressure:  Drinking caffeine.  Drinking alcohol.  Eating.  Smoking.  Exercising. Five minutes before you check your blood pressure:  Empty your bladder.  Sit quietly without talking in a dining chair, rather than in a soft couch or armchair. How to take your blood pressure To check your blood  pressure, follow the instructions in the manual that came with your blood pressure monitor. If you have a digital blood pressure monitor, the instructions may be as follows: 1. Sit up straight. 2. Place your feet on the floor. Do not cross your ankles or legs. 3. Rest your left arm at the level of your heart on a table or desk or on the arm of a chair. 4. Pull up your shirt sleeve. 5. Wrap the blood pressure cuff around the upper part of your left arm, 1 inch (2.5 cm) above your elbow. It is best to wrap the cuff around bare skin. 6. Fit the cuff snugly around your arm. You should be able to place only one finger between the cuff and your arm. 7. Position the cord inside the groove of your elbow. 8. Press the power button. 9. Sit quietly while the cuff inflates and deflates. 10. Read the digital reading on the monitor screen and write it down (record it). 11. Wait 2-3 minutes, then repeat the steps, starting at step 1. What does my blood pressure reading mean? A blood pressure reading consists of a higher number over a lower number. Ideally, your blood pressure should be below 120/80. The first ("top") number is called the systolic pressure. It is a measure of the pressure in your arteries as your heart beats. The second ("bottom") number is called the diastolic pressure. It is a measure of the pressure in your arteries as the heart relaxes. Blood pressure is  classified into four stages. The following are the stages for adults who do not have a short-term serious illness or a chronic condition. Systolic pressure and diastolic pressure are measured in a unit called mm Hg. Normal  Systolic pressure: below 103.  Diastolic pressure: below 80. Elevated  Systolic pressure: 159-458.  Diastolic pressure: below 80. Hypertension stage 1  Systolic pressure: 592-924.  Diastolic pressure: 46-28. Hypertension stage 2  Systolic pressure: 638 or above.  Diastolic pressure: 90 or above. You can  have prehypertension or hypertension even if only the systolic or only the diastolic number in your reading is higher than normal. Follow these instructions at home:  Check your blood pressure as often as recommended by your health care provider.  Take your monitor to the next appointment with your health care provider to make sure: ? That you are using it correctly. ? That it provides accurate readings.  Be sure you understand what your goal blood pressure numbers are.  Tell your health care provider if you are having any side effects from blood pressure medicine. Contact a health care provider if:  Your blood pressure is consistently high. Get help right away if:  Your systolic blood pressure is higher than 180.  Your diastolic blood pressure is higher than 110. This information is not intended to replace advice given to you by your health care provider. Make sure you discuss any questions you have with your health care provider. Document Released: 04/22/2016 Document Revised: 09/26/2017 Document Reviewed: 04/22/2016 Elsevier Interactive Patient Education  [employer].    If you have lab work done today you will be contacted with your lab results within the next 2 weeks.  If you have not heard from [name] then please contact us. The fastest way to get your results is to register for My Chart.   IF you received an x-ray today, you will receive an invoice from [facility]. Please contact [facility] at [phone] with questions or concerns regarding your invoice.   IF you received labwork today, you will receive an invoice from [place]. Please contact [facility] at [phone] with questions or concerns regarding your invoice.   Our billing staff will not be able to assist you with questions regarding bills from these companies.  You will be contacted with the lab results as soon as they are available. The fastest way to get your results is to activate your  My Chart account. Instructions are located on the last page of this paperwork. If you have not heard from [name] regarding the results in 2 weeks, please contact this office.

## 2019-01-24 LAB — COMPREHENSIVE METABOLIC PANEL
ALT: 24 IU/L (ref 0–44)
AST: 25 IU/L (ref 0–40)
Albumin/Globulin Ratio: 2.4 — ABNORMAL HIGH (ref 1.2–2.2)
Albumin: 4.7 g/dL (ref 3.8–4.8)
Alkaline Phosphatase: 48 IU/L (ref 39–117)
BUN/Creatinine Ratio: 13 (ref 10–24)
BUN: 13 mg/dL (ref 8–27)
Bilirubin Total: 0.6 mg/dL (ref 0.0–1.2)
CO2: 21 mmol/L (ref 20–29)
Calcium: 9.4 mg/dL (ref 8.6–10.2)
Chloride: 98 mmol/L (ref 96–106)
Creatinine, Ser: 1.02 mg/dL (ref 0.76–1.27)
GFR calc Af Amer: 86 mL/min/{1.73_m2} (ref >59)
GFR calc non Af Amer: 75 mL/min/{1.73_m2} (ref >59)
GLOBULIN, TOTAL: 2 g/dL (ref 1.5–4.5)
Glucose: 206 mg/dL — ABNORMAL HIGH (ref 65–99)
Potassium: 4.1 mmol/L (ref 3.5–5.2)
Sodium: 137 mmol/L (ref 134–144)
Total Protein: 6.7 g/dL (ref 6.0–8.5)

## 2019-01-24 LAB — HEMOGLOBIN A1C
Est. average glucose Bld gHb Est-mCnc: 235 mg/dL
Hgb A1c MFr Bld: 9.8 % — ABNORMAL HIGH (ref 4.8–5.6)

## 2019-01-25 ENCOUNTER — Encounter: Payer: Self-pay | Admitting: Family Medicine

## 2019-01-28 ENCOUNTER — Encounter: Payer: Self-pay | Admitting: Internal Medicine

## 2019-01-28 ENCOUNTER — Ambulatory Visit: Payer: Medicare Other | Admitting: Internal Medicine

## 2019-01-28 ENCOUNTER — Encounter: Payer: Medicare Other | Admitting: Physician Assistant

## 2019-01-28 VITALS — BP 134/72 | HR 97 | Ht 65.5 in | Wt 193.0 lb

## 2019-01-28 DIAGNOSIS — Z95810 Presence of automatic (implantable) cardiac defibrillator: Secondary | ICD-10-CM

## 2019-01-28 DIAGNOSIS — I472 Ventricular tachycardia, unspecified: Secondary | ICD-10-CM

## 2019-01-28 DIAGNOSIS — I428 Other cardiomyopathies: Secondary | ICD-10-CM

## 2019-01-28 DIAGNOSIS — I495 Sick sinus syndrome: Secondary | ICD-10-CM | POA: Diagnosis not present

## 2019-01-28 DIAGNOSIS — Z79899 Other long term (current) drug therapy: Secondary | ICD-10-CM

## 2019-01-28 MED ORDER — AMIODARONE HCL 200 MG PO TABS: ORAL_TABLET | ORAL | 3 refills | Status: DC

## 2019-01-28 NOTE — Patient Instructions (Signed)
Medication Instructions:  Your physician recommends that you continue on your current medications as directed. Please refer to the Current Medication list given to you today.  Labwork: You will have labs drawn today: CMP, BMP, TSH  Testing/Procedures: None ordered.  Follow-Up: Your physician recommends that you schedule a follow-up appointment in:   6 months with Dr Caryl Comes  Any Other Special Instructions Will Be Listed Below (If Applicable).     If you need a refill on your cardiac medications before your next appointment, please call your pharmacy.

## 2019-01-28 NOTE — Progress Notes (Signed)
Thank you very much yet      Patient Care Team: Wendie Agreste, MD as PCP - General (Family Medicine) [name], MD (Cardiology) [name], MD as Attending Physician (Neurology)   HPI  Dakota Gutierrez is a 68 y.o. 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 Unfortunately, in the days that followed he developed VT storm with multiple shocks and was treated with amiodarone which has been gradually down titrated to 100 mg a day.  Amiodarone surveillance had demonstrated elevated liver function tests spring 2013 . We elected to discontinue the amiodarone albeit with some reluctance on the part of the family. When seen in June of recurrent episodes of ventricular tachycardia 2 of which required therapy. He was then resumed on amiodarone and referred for consultation with GI  most recently 4/17 transaminases were normal.     He has hx   intercurrent pace terminated ventricular tachycardia.  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  There have been no palpitations, lightheadedness or syncope.    Patient denies symptoms of GI intolerance, sun sensitivity, neurological symptoms attributable to amiodarone.     Diet is eagerly replete with sodium as well as fluid    Date TSH  ALT   5/18 3.84 25    3/19 3.73 27  8/19 3.4 24   DATE TEST EF   2012 [facility] 56% No Ischemia  5/17 Echo   45-50 %            Past Medical History:  Diagnosis Date  . 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 Diabetes mellitus without complication ([name])   . Dual implantable cardiac defibrillator in situ    Medtronic [id number] Protecta  . Elevated transaminase level    10/2011  ?  amio  . GERD (gastroesophageal reflux disease)   . HTN (hypertension)   . Other primary cardiomyopathies   . Syncope   . Tubular adenoma of colon   . Ventricular tachycardia ([place])    storm following ICD implant on amiodarone     Past Surgical History:  Procedure Laterality Date  . CARDIAC DEFIBRILLATOR PLACEMENT     Medtronic [id number] Protecta  . CATARACT EXTRACTION, BILATERAL  2019  . CHOLECYSTECTOMY    . COLONOSCOPY WITH PROPOFOL N/A 06/30/2017   Procedure: COLONOSCOPY WITH PROPOFOL;  Surgeon: Carol Ada, MD;  Location: [facility];  Service: Endoscopy;  Laterality: N/A;  . GALLBLADDER SURGERY    . HERNIA REPAIR    . ICD GENERATOR CHANGEOUT N/A 04/26/2017   Procedure: ICD Generator Changeout;  Surgeon: [name], MD;  Location: [facility];  Service: Cardiovascular;  Laterality: N/A;  . TONSILLECTOMY AND ADENOIDECTOMY      Current Outpatient Medications  Medication Sig Dispense Refill  . acetaminophen (TYLENOL) 500 MG tablet Take 1,000 mg by mouth daily as needed for moderate pain or headache.    Marland Kitchen amiodarone (PACERONE) 200 MG tablet TAKE 1/2 (ONE-HALF) TABLET BY MOUTH ONCE DAILY 45 tablet 1  . aspirin EC 81 MG tablet Take 1 tablet (81 mg total) by mouth daily.    Marland Kitchen atorvastatin (LIPITOR) 10 MG tablet Take 1 tablet (10 mg total) by mouth daily.  90 tablet 1  . Cholecalciferol (VITAMIN D) 2000 UNITS tablet Take 2,000 Units by mouth daily.    . famotidine (PEPCID) 20 MG tablet Take 1 tablet (20 mg total) by mouth 2 (two) times daily as needed. 60 tablet 4  . metFORMIN (GLUCOPHAGE) 500 MG tablet Take 1 tablet (500 mg total) by mouth 2 (two) times daily with a meal. 180 tablet 1  . metoprolol succinate (TOPROL-XL) 100 MG 24 hr tablet TAKE 1 TABLET BY MOUTH ONCE DAILY IMMEDIATELY  FOLLOWING  A  MEAL 90 tablet 3  . olmesartan-hydrochlorothiazide (BENICAR HCT) 20-12.5 MG tablet Take 1 tablet by mouth daily. 90 tablet 1  . Omega-3 Fatty Acids (FISH OIL) 1000 MG CAPS Take  1,000 mg by mouth 2 (two) times daily.     Marland Kitchen OVER THE COUNTER MEDICATION     . tadalafil (CIALIS) 20 MG tablet Take 0.5-1 tablets (10-20 mg total) by mouth daily as needed for erectile dysfunction. 10 tablet 11   No current facility-administered medications for this visit.     Allergies  Allergen Reactions  . Adhesive [Tape]     Swelling and welts formed    Review of Systems negative except from HPI and PMH except for complaints of back pain and muscle pain  Physical Exam BP 134/72   Pulse 97   Ht 5' 5.5" (1.664 m)   Wt 193 lb (87.5 kg)   SpO2 96%   BMI 31.63 kg/m  Well developed and nourished in no acute distress HENT normal Neck supple with JVP-8 Carotids brisk and full without bruits Clear Regular rate and rhythm, no murmurs or gallops Abd-soft with active BS without hepatomegaly No Clubbing cyanosis 2+ edema Skin-warm and dry A & Oriented  Grossly normal sensory and motor function    ECG  Sinus @  97  Assessment and  Plan  VT Recurrent treated Ventricular tachycardia --ATPX2    ICD Medtronic The patient's device was interrogated.  The information was reviewed. No changes were made in the programming.        Sinus noded dysfunction   Amiodarone rx   HFpEF   No intercurrent treated ventricular tachycardia, recurrent nonsustained VT  Tolerating amiodarone  I reviewed the physiology of heart failure and salt.  Suggested that he use salt supplements.  He will continue his low-dose diuretic.  Blood pressure is reasonably controlled  We spent more than 50% of our >25 min visit in face to face counseling regarding the above    Tonic

## 2019-01-29 LAB — COMPREHENSIVE METABOLIC PANEL
ALT: 25 IU/L (ref 0–44)
AST: 27 IU/L (ref 0–40)
Albumin/Globulin Ratio: 2 (ref 1.2–2.2)
Albumin: 4.7 g/dL (ref 3.8–4.8)
Alkaline Phosphatase: 47 IU/L (ref 39–117)
BUN/Creatinine Ratio: 15 (ref 10–24)
BUN: 16 mg/dL (ref 8–27)
Bilirubin Total: 0.6 mg/dL (ref 0.0–1.2)
CO2: 21 mmol/L (ref 20–29)
Calcium: 9.8 mg/dL (ref 8.6–10.2)
Chloride: 97 mmol/L (ref 96–106)
Creatinine, Ser: 1.06 mg/dL (ref 0.76–1.27)
GFR calc Af Amer: 82 mL/min/{1.73_m2} (ref >59)
GFR calc non Af Amer: 71 mL/min/{1.73_m2} (ref >59)
Globulin, Total: 2.3 g/dL (ref 1.5–4.5)
Glucose: 179 mg/dL — ABNORMAL HIGH (ref 65–99)
Potassium: 3.8 mmol/L (ref 3.5–5.2)
Sodium: 137 mmol/L (ref 134–144)
Total Protein: 7 g/dL (ref 6.0–8.5)

## 2019-01-29 LAB — TSH: TSH: 3.25 u[IU]/mL (ref 0.450–4.500)

## 2019-02-06 LAB — CUP PACEART INCLINIC DEVICE CHECK
Battery Remaining Longevity: 93 mo
Battery Voltage: 2.98 V
Brady Statistic AP VP Percent: 13.76 %
Brady Statistic AP VS Percent: 73.44 %
Brady Statistic AS VP Percent: 0.02 %
Brady Statistic AS VS Percent: 12.78 %
Brady Statistic RA Percent Paced: 85.35 %
Brady Statistic RV Percent Paced: 13.23 %
Date Time Interrogation Session: 20200302214501
HIGH POWER IMPEDANCE MEASURED VALUE: 53 Ohm
HighPow Impedance: 43 Ohm
Implantable Lead Implant Date: 20120531
Implantable Lead Implant Date: 20120531
Implantable Lead Location: 753860
Implantable Lead Model: 4076
Implantable Lead Model: 6947
Implantable Pulse Generator Implant Date: 20180530
Lead Channel Impedance Value: 399 Ohm
Lead Channel Impedance Value: 456 Ohm
Lead Channel Impedance Value: 475 Ohm
Lead Channel Pacing Threshold Amplitude: 0.5 V
Lead Channel Pacing Threshold Amplitude: 0.5 V
Lead Channel Pacing Threshold Pulse Width: 0.4 ms
Lead Channel Pacing Threshold Pulse Width: 0.4 ms
Lead Channel Sensing Intrinsic Amplitude: 1.875 mV
Lead Channel Sensing Intrinsic Amplitude: 12.625 mV
Lead Channel Setting Pacing Amplitude: 1.5 V
Lead Channel Setting Pacing Amplitude: 2 V
Lead Channel Setting Pacing Pulse Width: 0.4 ms
Lead Channel Setting Sensing Sensitivity: 0.3 mV
MDC IDC LEAD LOCATION: 753859

## 2019-04-23 ENCOUNTER — Encounter: Payer: Self-pay | Admitting: Family Medicine

## 2019-04-23 ENCOUNTER — Ambulatory Visit: Payer: Medicare Other | Admitting: Family Medicine

## 2019-04-23 ENCOUNTER — Other Ambulatory Visit: Payer: Self-pay

## 2019-04-23 VITALS — BP 148/78 | HR 85 | Temp 98.5°F | Resp 14 | Ht 65.0 in | Wt 191.6 lb

## 2019-04-23 DIAGNOSIS — E785 Hyperlipidemia, unspecified: Secondary | ICD-10-CM

## 2019-04-23 DIAGNOSIS — E1165 Type 2 diabetes mellitus with hyperglycemia: Secondary | ICD-10-CM

## 2019-04-23 DIAGNOSIS — I10 Essential (primary) hypertension: Secondary | ICD-10-CM | POA: Diagnosis not present

## 2019-04-23 LAB — POCT GLYCOSYLATED HEMOGLOBIN (HGB A1C): Hemoglobin A1C: 8.1 % — AB (ref 4.0–5.6)

## 2019-04-23 LAB — LIPID PANEL

## 2019-04-23 LAB — GLUCOSE, POCT (MANUAL RESULT ENTRY): POC Glucose: 207 mg/dl — AB (ref 70–99)

## 2019-04-23 MED ORDER — OLMESARTAN MEDOXOMIL-HCTZ 20-12.5 MG PO TABS: 1.0000 | ORAL_TABLET | Freq: Every day | ORAL | 1 refills | Status: DC

## 2019-04-23 MED ORDER — ATORVASTATIN CALCIUM 10 MG PO TABS: 10.0000 mg | ORAL_TABLET | Freq: Every day | ORAL | 1 refills | Status: DC

## 2019-04-23 MED ORDER — METFORMIN HCL 850 MG PO TABS: 850.0000 mg | ORAL_TABLET | Freq: Two times a day (BID) | ORAL | 1 refills | Status: DC

## 2019-04-23 NOTE — Patient Instructions (Addendum)
A1c has improved.  Still not at goal.  Try to work on diet as that sounds like it made a significant impact before, but can also try higher dose of metformin at 850 mg twice per day for now.  Return in 3 months for repeat testing.  Let me know if there are questions in the meantime.   Type 2 Diabetes Mellitus, Self Care, Adult When you have type 2 diabetes (type 2 diabetes mellitus), you must make sure your blood sugar (glucose) stays in a healthy range. You can do this with:  Nutrition.  Exercise.  Lifestyle changes.  Medicines or insulin, if needed.  Support from your doctors and others. How to stay aware of blood sugar   Check your blood sugar level every day, as often as told.  Have your A1c (hemoglobin A1c) level checked two or more times a year. Have it checked more often if your doctor tells you to. Your doctor will set personal treatment goals for you. Generally, you should have these blood sugar levels:  Before meals (preprandial): 80-130 mg/dL (4.4-7.2 mmol/L).  After meals (postprandial): below 180 mg/dL (10 mmol/L).  A1c level: less than 7%. How to manage high and low blood sugar Signs of high blood sugar High blood sugar is called hyperglycemia. Know the signs of high blood sugar. Signs may include:  Feeling: ? Thirsty. ? Hungry. ? Very tired.  Needing to pee (urinate) more than usual.  Blurry vision. Signs of low blood sugar Low blood sugar is called hypoglycemia. This is when blood sugar is at or below 70 mg/dL (3.9 mmol/L). Signs may include:  Feeling: ? Hungry. ? Worried or nervous (anxious). ? Sweaty and clammy. ? Confused. ? Dizzy. ? Sleepy. ? Sick to your stomach (nauseous).  Having: ? A fast heartbeat. ? A headache. ? A change in your vision. ? Jerky movements that you cannot control (seizure). ? Tingling or no feeling (numbness) around your mouth, lips, or tongue.  Having trouble with: ? Moving  (coordination). ? Sleeping. ? Passing out (fainting). ? Getting upset easily (irritability). Treating low blood sugar To treat low blood sugar, eat or drink something sugary right away. If you can think clearly and swallow safely, follow the 15:15 rule:  Take 15 grams of a fast-acting carb (carbohydrate). Talk with your doctor about how much you should take.  Some fast-acting carbs are: ? Sugar tablets (glucose pills). Take 3-4 pills. ? 6-8 pieces of hard candy. ? 4-6 oz (120-150 mL) of fruit juice. ? 4-6 oz (120-150 mL) of regular (not diet) soda. ? 1 Tbsp (15 mL) honey or sugar.  Check your blood sugar 15 minutes after you take the carb.  If your blood sugar is still at or below 70 mg/dL (3.9 mmol/L), take 15 grams of a carb again.  If your blood sugar does not go above 70 mg/dL (3.9 mmol/L) after 3 tries, get help right away.  After your blood sugar goes back to normal, eat a meal or a snack within 1 hour. Treating very low blood sugar If your blood sugar is at or below 54 mg/dL (3 mmol/L), you have very low blood sugar (severe hypoglycemia). This is an emergency. Do not wait to see if the symptoms will go away. Get medical help right away. Call your local emergency services (911 in the [place] If you have very low blood sugar and you cannot eat or drink, you may need a glucagon shot (injection). A family member or friend should  learn how to check your blood sugar and how to give you a glucagon shot. Ask your doctor if you need to have a glucagon shot kit at home. Follow these instructions at home: Medicine  Take insulin and diabetes medicines as told.  If your doctor says you should take more or less insulin and medicines, do this exactly as told.  Do not run out of insulin or medicines. Having diabetes can raise your risk for other long-term conditions. These include heart disease and kidney disease. Your doctor may prescribe medicines to help you not have these  problems. Food   Make healthy food choices. These include: ? Chicken, fish, egg whites, and beans. ? Oats, whole wheat, bulgur, brown rice, quinoa, and millet. ? Fresh fruits and vegetables. ? Low-fat dairy products. ? Nuts, avocado, olive oil, and canola oil.  Meet with a food specialist (dietitian). He or she can help you make an eating plan that is right for you.  Follow instructions from your doctor about what you cannot eat or drink.  Drink enough fluid to keep your pee (urine) pale yellow.  Keep track of carbs that you eat. Do this by reading food labels and learning food serving sizes.  Follow your sick day plan when you cannot eat or drink normally. Make this plan with your doctor so it is ready to use. Activity  Exercise 3 or more times a week.  Do not go more than 2 days without exercising.  Talk with your doctor before you start a new exercise. Your doctor may need to tell you to change: ? How much insulin or medicines you take. ? How much food you eat. Lifestyle  Do not use any tobacco products. These include cigarettes, chewing tobacco, and e-cigarettes. If you need help quitting, ask your doctor.  Ask your doctor how much alcohol is safe for you.  Learn to deal with stress. If you need help with this, ask your doctor. Body care   Stay up to date with your shots (immunizations).  Have your eyes and feet checked by a doctor as often as told.  Check your skin and feet every day. Check for cuts, bruises, redness, blisters, or sores.  Brush your teeth and gums two times a day. Floss one or more times a day.  Go to the dentist one or more times every 6 months.  Stay at a healthy weight. General instructions  Take over-the-counter and prescription medicines only as told by your doctor.  Share your diabetes care plan with: ? Your work or school. ? People you live with.  Carry a card or wear jewelry that says you have diabetes.  Keep all follow-up  visits as told by your doctor. This is important. Questions to ask your doctor  Do I need to meet with a diabetes educator?  Where can I find a support group for people with diabetes? Where to find more information To learn more about diabetes, visit:  American Diabetes Association: www.diabetes.org  American Association of Diabetes Educators: www.diabeteseducator.org Summary  When you have type 2 diabetes, you must make sure your blood sugar (glucose) stays in a healthy range.  Check your blood sugar every day, as often as told.  Having diabetes can raise your risk for other conditions. Your doctor may prescribe medicines to help you not have these problems.  Keep all follow-up visits as told by your doctor. This is important. This information is not intended to replace advice given to you by your  health care provider. Make sure you discuss any questions you have with your health care provider. Document Released: 03/07/2016 Document Revised: 05/07/2018 Document Reviewed: 12/18/2015 Elsevier Interactive Patient Education  [employer].   If you have lab work done today you will be contacted with your lab results within the next 2 weeks.  If you have not heard from [name] then please contact us. The fastest way to get your results is to register for My Chart.   IF you received an x-ray today, you will receive an invoice from [facility]. Please contact [facility] at [phone] with questions or concerns regarding your invoice.   IF you received labwork today, you will receive an invoice from [facility]. Please contact [facility] at [phone] with questions or concerns regarding your invoice.   Our billing staff will not be able to assist you with questions regarding bills from these companies.  You will be contacted with the lab results as soon as they are available. The fastest way to get your results is to activate your My Chart account. Instructions are  located on the last page of this paperwork. If you have not heard from [name] regarding the results in 2 weeks, please contact this office.

## 2019-04-23 NOTE — Progress Notes (Signed)
Subjective:    Patient ID: Dakota Gutierrez, male    DOB: Feb 24, 1951, 68 y.o.   MRN: [id number]  HPI Dakota Gutierrez is a 68 y.o. male Presents today for: Chief Complaint  Patient presents with  . Diabetes    3 mon f/u on diabetes. Blood sugar yday was 185. Blood pressure was elevated last visit on 01/23/19 have been monitoring bp yday was 130/76 took it 15 min 116/72   Diabetes: Complicated by hyperglycemia.  Elevated A1c of 9.8 in February.  Plan for quick follow-up with them in 4 to 6 weeks to review meds and changes. Currently on metformin 500 mg twice daily.  On ARB with Benicar, statin with Lipitor.  Aspirin 81 mg daily, followed by cardiology. Microalbumin: Normal microalbumin/creatinine ratio in November 2019, 12.4 Optho, foot exam, pneumovax: Up-to-date.  Followed by ophthalmology, appt in July.  Home readings b/t 140-150.  Improved diet for about 6 weeks after last visit - 120-130, but worse diet since. Worst reading yesterday 185.  Exercise: 4 days per week, 3/4-1.5 miles. 35-50 min. 8-10k steps per day.    Lab Results  Component Value Date   HGBA1C 9.8 (H) 01/23/2019   HGBA1C 9.8 (A) 01/23/2019   HGBA1C 8.1 (A) 10/17/2018   Lab Results  Component Value Date   MICROALBUR 0.7 09/15/2016   LDLCALC 82 10/17/2018   CREATININE 1.06 01/28/2019    Hypertension: BP Readings from Last 3 Encounters:  04/23/19 (!) 148/78  01/28/19 134/72  01/23/19 (!) 143/82   Lab Results  Component Value Date   CREATININE 1.06 01/28/2019  Home readings have been lower, 130/76 followed by 116/72 yesterday.  Currently on Toprol 100 mg daily, on losartan hydrochlorothiazide 20/12.5 mg daily.  Followed by cardiology for ventricular tachycardia, has implantable cardioverter-defibrillator, amiodarone. Appointment with Dr. Caryl Comes March 2, blood pressure 134/72.   Hyperlipidemia:  Lab Results  Component Value Date   CHOL 172 10/17/2018   HDL 79 10/17/2018   LDLCALC 82 10/17/2018    LDLDIRECT 90.5 08/06/2013   TRIG 55 10/17/2018   CHOLHDL 2.2 10/17/2018   Lab Results  Component Value Date   ALT 25 01/28/2019   AST 27 01/28/2019   ALKPHOS 47 01/28/2019   BILITOT 0.6 01/28/2019  Lipitor 10 mg daily. No new myalgias/side effects.   Patient Active Problem List   Diagnosis Date Noted  . Type II diabetes mellitus ([place]) 01/16/2015  . Hypersomnolence 08/14/2012  . History of Helicobacter pylori infection 04/16/2012  . GERD (gastroesophageal reflux disease) 04/16/2012  . Elevated transaminase level 02/02/2012  . Obesity 12/29/2011  . 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 Syncope   . Ventricular tachycardia ([place])   . Anxiety    Past Medical History:  Diagnosis Date  . 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 Diabetes mellitus without complication ([place])   . Dual implantable cardiac defibrillator in situ    Medtronic [id number] Protecta  . Elevated transaminase level    10/2011  ? amio  . GERD (gastroesophageal reflux disease)   . HTN (hypertension)   . Other primary cardiomyopathies   . Syncope   . Tubular adenoma of colon   . Ventricular tachycardia ([place])    storm following ICD implant on amiodarone    Past Surgical History:  Procedure Laterality Date  . CARDIAC DEFIBRILLATOR PLACEMENT     Medtronic [id number] Protecta  . CATARACT EXTRACTION, BILATERAL  2019  . CHOLECYSTECTOMY    . COLONOSCOPY WITH PROPOFOL N/A  06/30/2017   Procedure: COLONOSCOPY WITH PROPOFOL;  Surgeon: Carol Ada, MD;  Location: [facility];  Service: Endoscopy;  Laterality: N/A;  . GALLBLADDER SURGERY    . HERNIA REPAIR    . ICD GENERATOR CHANGEOUT N/A 04/26/2017   Procedure: ICD Generator Changeout;  Surgeon: [name], MD;  Location: [facility];  Service: Cardiovascular;  Laterality: N/A;  . TONSILLECTOMY AND ADENOIDECTOMY     Allergies  Allergen Reactions  . Adhesive [Tape]     Swelling and welts formed   Prior to Admission  medications   Medication Sig Start Date End Date Taking? Authorizing Provider  acetaminophen (TYLENOL) 500 MG tablet Take 1,000 mg by mouth daily as needed for moderate pain or headache.   Yes [provider]  amiodarone (PACERONE) 200 MG tablet TAKE 1/2 (ONE-HALF) TABLET BY MOUTH ONCE DAILY 01/28/19  Yes [name], MD  aspirin EC 81 MG tablet Take 1 tablet (81 mg total) by mouth daily. 11/11/11  Yes [name], MD  atorvastatin (LIPITOR) 10 MG tablet Take 1 tablet (10 mg total) by mouth daily. 01/23/19  Yes Wendie Agreste, MD  Cholecalciferol (VITAMIN D) 2000 UNITS tablet Take 2,000 Units by mouth daily.   Yes [provider]  famotidine (PEPCID) 20 MG tablet Take 1 tablet (20 mg total) by mouth 2 (two) times daily as needed. 04/16/12  Yes Pyrtle, Lajuan Lines, MD  metFORMIN (GLUCOPHAGE) 500 MG tablet Take 1 tablet (500 mg total) by mouth 2 (two) times daily with a meal. 01/23/19  Yes Wendie Agreste, MD  metoprolol succinate (TOPROL-XL) 100 MG 24 hr tablet TAKE 1 TABLET BY MOUTH ONCE DAILY IMMEDIATELY  FOLLOWING  A  MEAL 09/19/18  Yes [name], MD  olmesartan-hydrochlorothiazide (BENICAR HCT) 20-12.5 MG tablet Take 1 tablet by mouth daily. 01/23/19  Yes Wendie Agreste, MD  Omega-3 Fatty Acids (FISH OIL) 1000 MG CAPS Take 1,000 mg by mouth 2 (two) times daily.    Yes [provider]  OVER THE COUNTER MEDICATION    Yes [provider]  tadalafil (CIALIS) 20 MG tablet Take 0.5-1 tablets (10-20 mg total) by mouth daily as needed for erectile dysfunction. 09/15/16  Yes Wendie Agreste, MD   Social History   Socioeconomic History  . Marital status: Married    Spouse name: Not on file  . Number of children: 0  . Years of education: Not on file  . Highest education level: Bachelor's degree (e.g., BA, AB, BS)  Occupational History  . Occupation: Retired Tour manager  . Financial resource strain: Not hard at all  . Food insecurity:     Worry: Never true    Inability: Never true  . Transportation needs:    Medical: No    Non-medical: No  Tobacco Use  . Smoking status: Never Smoker  . Smokeless tobacco: Never Used  Substance and Sexual Activity  . Alcohol use: Yes    Alcohol/week: 2.0 standard drinks    Types: 2 Cans of beer per week    Comment: 4/week  . Drug use: No  . Sexual activity: Yes  Lifestyle  . Physical activity:    Days per week: 4 days    Minutes per session: 50 min  . Stress: Not at all  Relationships  . Social connections:    Talks on phone: More than three times a week    Gets together: More than three times a week  Attends religious service: More than 4 times per year    Active member of club or organization: No    Attends meetings of clubs or organizations: Never    Relationship status: Married  . Intimate partner violence:    Fear of current or ex partner: No    Emotionally abused: No    Physically abused: No    Forced sexual activity: No  Other Topics Concern  . Not on file  Social History Narrative   Divorced. Education: [employer]. Exercise: walking 3 times a week for 30 minutes.   Retired Pharmacist, hospital    Review of Systems  Constitutional: Negative for fatigue and unexpected weight change.  Eyes: Negative for visual disturbance.  Respiratory: Negative for cough, chest tightness and shortness of breath.   Cardiovascular: Negative for chest pain, palpitations and leg swelling.  Gastrointestinal: Negative for abdominal pain and blood in stool.  Neurological: Negative for dizziness, light-headedness and headaches.       Objective:   Physical Exam Vitals signs reviewed.  Constitutional:      Appearance: He is well-developed.  HENT:     Head: Normocephalic and atraumatic.  Eyes:     Pupils: Pupils are equal, round, and reactive to light.  Neck:     Vascular: No carotid bruit or JVD.  Cardiovascular:     Rate and Rhythm: Normal rate and regular rhythm.     Heart sounds: Normal  heart sounds. No murmur.  Pulmonary:     Effort: Pulmonary effort is normal.     Breath sounds: Normal breath sounds. No rales.  Musculoskeletal:     Right lower leg: No edema.     Left lower leg: No edema.  Skin:    General: Skin is warm and dry.  Neurological:     Mental Status: He is alert and oriented to person, place, and time.    Vitals:   04/23/19 1111  BP: (!) 148/78  Pulse: 85  Resp: 14  Temp: 98.5 F (36.9 C)  TempSrc: Oral  SpO2: 98%  Weight: 191 lb 9.6 oz (86.9 kg)  Height: '5\' 5"'  (1.651 m)    Results for orders placed or performed in visit on 04/23/19  POCT glucose (manual entry)  Result Value Ref Range   POC Glucose 207 (A) 70 - 99 mg/dl  POCT glycosylated hemoglobin (Hb A1C)  Result Value Ref Range   Hemoglobin A1C 8.1 (A) 4.0 - 5.6 %   HbA1c POC (<> result, manual entry)     HbA1c, POC (prediabetic range)     HbA1c, POC (controlled diabetic range)           Assessment & Plan:    Dakota Gutierrez is a 68 y.o. male Type 2 diabetes mellitus with hyperglycemia, without long-term current use of insulin ([place]) - Plan: POCT glucose (manual entry), POCT glycosylated hemoglobin (Hb A1C), metFORMIN (GLUCOPHAGE) 850 MG tablet  -Improved A1c but still decreased control.  Metformin increased to 850 mg twice daily along with planned diet changes, recheck 3 months.  Hyperlipidemia, unspecified hyperlipidemia type - Plan: Comprehensive metabolic panel, Lipid panel, atorvastatin (LIPITOR) 10 MG tablet  -  Stable, tolerating current regimen. Medications refilled. Labs pending as above.   Essential hypertension - Plan: Comprehensive metabolic panel, olmesartan-hydrochlorothiazide (BENICAR HCT) 20-12.5 MG tablet  -Continue same regimen as lower readings at home as well as recent cardiology visit.  Continue to monitor readings with RTC precautions  Meds ordered this encounter  Medications  . metFORMIN (GLUCOPHAGE) 850 MG  tablet    Sig: Take 1 tablet (850 mg total)  by mouth 2 (two) times daily with a meal.    Dispense:  180 tablet    Refill:  1  . atorvastatin (LIPITOR) 10 MG tablet    Sig: Take 1 tablet (10 mg total) by mouth daily.    Dispense:  90 tablet    Refill:  1  . olmesartan-hydrochlorothiazide (BENICAR HCT) 20-12.5 MG tablet    Sig: Take 1 tablet by mouth daily.    Dispense:  90 tablet    Refill:  1   Patient Instructions     A1c has improved.  Still not at goal.  Try to work on diet as that sounds like it made a significant impact before, but can also try higher dose of metformin at 850 mg twice per day for now.  Return in 3 months for repeat testing.  Let me know if there are questions in the meantime.   Type 2 Diabetes Mellitus, Self Care, Adult When you have type 2 diabetes (type 2 diabetes mellitus), you must make sure your blood sugar (glucose) stays in a healthy range. You can do this with:  Nutrition.  Exercise.  Lifestyle changes.  Medicines or insulin, if needed.  Support from your doctors and others. How to stay aware of blood sugar   Check your blood sugar level every day, as often as told.  Have your A1c (hemoglobin A1c) level checked two or more times a year. Have it checked more often if your doctor tells you to. Your doctor will set personal treatment goals for you. Generally, you should have these blood sugar levels:  Before meals (preprandial): 80-130 mg/dL (4.4-7.2 mmol/L).  After meals (postprandial): below 180 mg/dL (10 mmol/L).  A1c level: less than 7%. How to manage high and low blood sugar Signs of high blood sugar High blood sugar is called hyperglycemia. Know the signs of high blood sugar. Signs may include:  Feeling: ? Thirsty. ? Hungry. ? Very tired.  Needing to pee (urinate) more than usual.  Blurry vision. Signs of low blood sugar Low blood sugar is called hypoglycemia. This is when blood sugar is at or below 70 mg/dL (3.9 mmol/L). Signs may include:  Feeling: ? Hungry. ?  Worried or nervous (anxious). ? Sweaty and clammy. ? Confused. ? Dizzy. ? Sleepy. ? Sick to your stomach (nauseous).  Having: ? A fast heartbeat. ? A headache. ? A change in your vision. ? Jerky movements that you cannot control (seizure). ? Tingling or no feeling (numbness) around your mouth, lips, or tongue.  Having trouble with: ? Moving (coordination). ? Sleeping. ? Passing out (fainting). ? Getting upset easily (irritability). Treating low blood sugar To treat low blood sugar, eat or drink something sugary right away. If you can think clearly and swallow safely, follow the 15:15 rule:  Take 15 grams of a fast-acting carb (carbohydrate). Talk with your doctor about how much you should take.  Some fast-acting carbs are: ? Sugar tablets (glucose pills). Take 3-4 pills. ? 6-8 pieces of hard candy. ? 4-6 oz (120-150 mL) of fruit juice. ? 4-6 oz (120-150 mL) of regular (not diet) soda. ? 1 Tbsp (15 mL) honey or sugar.  Check your blood sugar 15 minutes after you take the carb.  If your blood sugar is still at or below 70 mg/dL (3.9 mmol/L), take 15 grams of a carb again.  If your blood sugar does not go above 70 mg/dL (3.9 mmol/L)  after 3 tries, get help right away.  After your blood sugar goes back to normal, eat a meal or a snack within 1 hour. Treating very low blood sugar If your blood sugar is at or below 54 mg/dL (3 mmol/L), you have very low blood sugar (severe hypoglycemia). This is an emergency. Do not wait to see if the symptoms will go away. Get medical help right away. Call your local emergency services (911 in the [place] If you have very low blood sugar and you cannot eat or drink, you may need a glucagon shot (injection). A family member or friend should learn how to check your blood sugar and how to give you a glucagon shot. Ask your doctor if you need to have a glucagon shot kit at home. Follow these instructions at home: Medicine  Take insulin and diabetes  medicines as told.  If your doctor says you should take more or less insulin and medicines, do this exactly as told.  Do not run out of insulin or medicines. Having diabetes can raise your risk for other long-term conditions. These include heart disease and kidney disease. Your doctor may prescribe medicines to help you not have these problems. Food   Make healthy food choices. These include: ? Chicken, fish, egg whites, and beans. ? Oats, whole wheat, bulgur, brown rice, quinoa, and millet. ? Fresh fruits and vegetables. ? Low-fat dairy products. ? Nuts, avocado, olive oil, and canola oil.  Meet with a food specialist (dietitian). He or she can help you make an eating plan that is right for you.  Follow instructions from your doctor about what you cannot eat or drink.  Drink enough fluid to keep your pee (urine) pale yellow.  Keep track of carbs that you eat. Do this by reading food labels and learning food serving sizes.  Follow your sick day plan when you cannot eat or drink normally. Make this plan with your doctor so it is ready to use. Activity  Exercise 3 or more times a week.  Do not go more than 2 days without exercising.  Talk with your doctor before you start a new exercise. Your doctor may need to tell you to change: ? How much insulin or medicines you take. ? How much food you eat. Lifestyle  Do not use any tobacco products. These include cigarettes, chewing tobacco, and e-cigarettes. If you need help quitting, ask your doctor.  Ask your doctor how much alcohol is safe for you.  Learn to deal with stress. If you need help with this, ask your doctor. Body care   Stay up to date with your shots (immunizations).  Have your eyes and feet checked by a doctor as often as told.  Check your skin and feet every day. Check for cuts, bruises, redness, blisters, or sores.  Brush your teeth and gums two times a day. Floss one or more times a day.  Go to the dentist  one or more times every 6 months.  Stay at a healthy weight. General instructions  Take over-the-counter and prescription medicines only as told by your doctor.  Share your diabetes care plan with: ? Your work or school. ? People you live with.  Carry a card or wear jewelry that says you have diabetes.  Keep all follow-up visits as told by your doctor. This is important. Questions to ask your doctor  Do I need to meet with a diabetes educator?  Where can I find a support group for people  with diabetes? Where to find more information To learn more about diabetes, visit:  American Diabetes Association: www.diabetes.org  American Association of Diabetes Educators: www.diabeteseducator.org Summary  When you have type 2 diabetes, you must make sure your blood sugar (glucose) stays in a healthy range.  Check your blood sugar every day, as often as told.  Having diabetes can raise your risk for other conditions. Your doctor may prescribe medicines to help you not have these problems.  Keep all follow-up visits as told by your doctor. This is important. This information is not intended to replace advice given to you by your health care provider. Make sure you discuss any questions you have with your health care provider. Document Released: 03/07/2016 Document Revised: 05/07/2018 Document Reviewed: 12/18/2015 Elsevier Interactive Patient Education  [employer].   If you have lab work done today you will be contacted with your lab results within the next 2 weeks.  If you have not heard from [name] then please contact us. The fastest way to get your results is to register for My Chart.   IF you received an x-ray today, you will receive an invoice from [facility]. Please contact [facility] at [phone] with questions or concerns regarding your invoice.   IF you received labwork today, you will receive an invoice from [facility]. Please contact [facility] at  [phone] with questions or concerns regarding your invoice.   Our billing staff will not be able to assist you with questions regarding bills from these companies.  You will be contacted with the lab results as soon as they are available. The fastest way to get your results is to activate your My Chart account. Instructions are located on the last page of this paperwork. If you have not heard from [name] regarding the results in 2 weeks, please contact this office.       Signed,   [name], MD [facility].  04/27/19 8:51 PM

## 2019-04-24 LAB — COMPREHENSIVE METABOLIC PANEL
ALT: 22 IU/L (ref 0–44)
AST: 25 IU/L (ref 0–40)
Albumin/Globulin Ratio: 2.2 (ref 1.2–2.2)
Albumin: 4.7 g/dL (ref 3.8–4.8)
Alkaline Phosphatase: 43 IU/L (ref 39–117)
BUN/Creatinine Ratio: 12 (ref 10–24)
BUN: 13 mg/dL (ref 8–27)
Bilirubin Total: 0.6 mg/dL (ref 0.0–1.2)
CO2: 21 mmol/L (ref 20–29)
Calcium: 9.2 mg/dL (ref 8.6–10.2)
Chloride: 99 mmol/L (ref 96–106)
Creatinine, Ser: 1.07 mg/dL (ref 0.76–1.27)
GFR calc Af Amer: 81 mL/min/{1.73_m2} (ref >59)
GFR calc non Af Amer: 70 mL/min/{1.73_m2} (ref >59)
Globulin, Total: 2.1 g/dL (ref 1.5–4.5)
Glucose: 213 mg/dL — ABNORMAL HIGH (ref 65–99)
Potassium: 4.2 mmol/L (ref 3.5–5.2)
Sodium: 138 mmol/L (ref 134–144)
Total Protein: 6.8 g/dL (ref 6.0–8.5)

## 2019-04-24 LAB — LIPID PANEL
Chol/HDL Ratio: 3.7 ratio (ref 0.0–5.0)
Cholesterol, Total: 112 mg/dL (ref 100–199)
HDL: 30 mg/dL — ABNORMAL LOW (ref >39)
LDL Calculated: 28 mg/dL (ref 0–99)
Triglycerides: 272 mg/dL — ABNORMAL HIGH (ref 0–149)
VLDL Cholesterol Cal: 54 mg/dL — ABNORMAL HIGH (ref 5–40)

## 2019-04-27 ENCOUNTER — Encounter: Payer: Self-pay | Admitting: Family Medicine

## 2019-04-29 ENCOUNTER — Ambulatory Visit (INDEPENDENT_AMBULATORY_CARE_PROVIDER_SITE_OTHER): Payer: Medicare Other | Admitting: *Deleted

## 2019-04-29 DIAGNOSIS — I472 Ventricular tachycardia, unspecified: Secondary | ICD-10-CM

## 2019-04-30 LAB — CUP PACEART REMOTE DEVICE CHECK
Battery Remaining Longevity: 85 mo
Battery Voltage: 2.98 V
Brady Statistic AP VP Percent: 21.06 %
Brady Statistic AP VS Percent: 68.18 %
Brady Statistic AS VP Percent: 0.02 %
Brady Statistic AS VS Percent: 10.74 %
Brady Statistic RA Percent Paced: 88.49 %
Brady Statistic RV Percent Paced: 20.94 %
Date Time Interrogation Session: 20200601062706
HighPow Impedance: 38 Ohm
HighPow Impedance: 45 Ohm
Implantable Lead Implant Date: 20120531
Implantable Lead Implant Date: 20120531
Implantable Lead Location: 753859
Implantable Lead Location: 753860
Implantable Lead Model: 4076
Implantable Lead Model: 6947
Implantable Pulse Generator Implant Date: 20180530
Lead Channel Impedance Value: 304 Ohm
Lead Channel Impedance Value: 399 Ohm
Lead Channel Impedance Value: 399 Ohm
Lead Channel Pacing Threshold Amplitude: 0.5 V
Lead Channel Pacing Threshold Amplitude: 0.625 V
Lead Channel Pacing Threshold Pulse Width: 0.4 ms
Lead Channel Pacing Threshold Pulse Width: 0.4 ms
Lead Channel Sensing Intrinsic Amplitude: 1.75 mV
Lead Channel Sensing Intrinsic Amplitude: 1.75 mV
Lead Channel Sensing Intrinsic Amplitude: 12.5 mV
Lead Channel Sensing Intrinsic Amplitude: 12.5 mV
Lead Channel Setting Pacing Amplitude: 1.5 V
Lead Channel Setting Pacing Amplitude: 2 V
Lead Channel Setting Pacing Pulse Width: 0.4 ms
Lead Channel Setting Sensing Sensitivity: 0.3 mV

## 2019-05-07 ENCOUNTER — Encounter: Payer: Self-pay | Admitting: Cardiology

## 2019-05-07 NOTE — Progress Notes (Signed)
Remote ICD transmission.   

## 2019-06-25 LAB — HM DIABETES EYE EXAM

## 2019-07-19 ENCOUNTER — Encounter: Payer: Self-pay | Admitting: Family Medicine

## 2019-07-24 ENCOUNTER — Ambulatory Visit: Payer: Medicare Other | Admitting: Family Medicine

## 2019-07-29 ENCOUNTER — Telehealth: Payer: Self-pay | Admitting: Emergency Medicine

## 2019-07-29 ENCOUNTER — Ambulatory Visit (INDEPENDENT_AMBULATORY_CARE_PROVIDER_SITE_OTHER): Payer: Medicare Other | Admitting: *Deleted

## 2019-07-29 DIAGNOSIS — I472 Ventricular tachycardia, unspecified: Secondary | ICD-10-CM

## 2019-07-29 LAB — CUP PACEART REMOTE DEVICE CHECK
Battery Remaining Longevity: 81 mo
Battery Voltage: 2.97 V
Brady Statistic AP VP Percent: 12.85 %
Brady Statistic AP VS Percent: 75.44 %
Brady Statistic AS VP Percent: 0.02 %
Brady Statistic AS VS Percent: 11.69 %
Brady Statistic RA Percent Paced: 87.83 %
Brady Statistic RV Percent Paced: 12.75 %
Date Time Interrogation Session: 20200831073422
HighPow Impedance: 38 Ohm
HighPow Impedance: 45 Ohm
Implantable Lead Implant Date: 20120531
Implantable Lead Implant Date: 20120531
Implantable Lead Location: 753859
Implantable Lead Location: 753860
Implantable Lead Model: 4076
Implantable Lead Model: 6947
Implantable Pulse Generator Implant Date: 20180530
Lead Channel Impedance Value: 304 Ohm
Lead Channel Impedance Value: 361 Ohm
Lead Channel Impedance Value: 399 Ohm
Lead Channel Pacing Threshold Amplitude: 0.5 V
Lead Channel Pacing Threshold Amplitude: 0.625 V
Lead Channel Pacing Threshold Pulse Width: 0.4 ms
Lead Channel Pacing Threshold Pulse Width: 0.4 ms
Lead Channel Sensing Intrinsic Amplitude: 1.75 mV
Lead Channel Sensing Intrinsic Amplitude: 1.75 mV
Lead Channel Sensing Intrinsic Amplitude: 10.375 mV
Lead Channel Sensing Intrinsic Amplitude: 10.375 mV
Lead Channel Setting Pacing Amplitude: 1.5 V
Lead Channel Setting Pacing Amplitude: 2 V
Lead Channel Setting Pacing Pulse Width: 0.4 ms
Lead Channel Setting Sensing Sensitivity: 0.3 mV

## 2019-07-29 NOTE — Telephone Encounter (Signed)
Spoke with patient and he reports he has not "felt well" the past few months due to changes in diabetic meds that have caused episodes of diarrhea. Pt reports no CP , syncope, sob or palpitations from 07/20/19 to  07/29/19. Reports no missed doses of Amiodorone or Toprol XL.  Pt has appointment with Enedina Finner NP on 08/07/2019.  Alerts shows 4  episodes binned as NSVT with the longest lasting 19 seconds on 07/21/19 with highest v rate of 146 bpm.

## 2019-07-30 ENCOUNTER — Other Ambulatory Visit: Payer: Self-pay

## 2019-07-30 ENCOUNTER — Encounter: Payer: Self-pay | Admitting: Adult Health

## 2019-07-30 ENCOUNTER — Ambulatory Visit (INDEPENDENT_AMBULATORY_CARE_PROVIDER_SITE_OTHER): Payer: Medicare Other | Admitting: Adult Health

## 2019-07-30 VITALS — BP 127/83 | HR 104 | Temp 97.8°F | Ht 65.0 in | Wt 191.6 lb

## 2019-07-30 DIAGNOSIS — G4733 Obstructive sleep apnea (adult) (pediatric): Secondary | ICD-10-CM

## 2019-07-30 DIAGNOSIS — Z9989 Dependence on other enabling machines and devices: Secondary | ICD-10-CM | POA: Diagnosis not present

## 2019-07-30 NOTE — Progress Notes (Signed)
PATIENT: Dakota Gutierrez DOB: 04-Aug-1951  REASON FOR VISIT: follow up HISTORY FROM: patient  HISTORY OF PRESENT ILLNESS: Today 07/30/19:  Dakota Gutierrez is a 68 year old male with a history of obstructive sleep apnea on CPAP.  He returns today for follow-up.  His download indicates that he uses machine nightly for compliance of 100%.  He uses machine greater than 4 hours each night.  On average he uses his machine 5 hours and 32 minutes.  His residual AHI is 0.7 on 9 cm of water with EPR 3.  He does not have a significant leak.  He reports that the CPAP continues to work well for him.  He denies any new issues regarding his sleep.  He reports that he is seeing his primary care to get his diabetes under control.  He returns today for evaluation.   REVIEW OF SYSTEMS: Out of a complete 14 system review of symptoms, the patient complains only of the following symptoms, and all other reviewed systems are negative.  Epworth sleepiness score is 10 fatigue severity score 13  Leg swelling, diarrhea, back pain, muscle cramps, moles, itching, headache  ALLERGIES: Allergies  Allergen Reactions  . Adhesive [Tape]     Swelling and welts formed    HOME MEDICATIONS: Outpatient Medications Prior to Visit  Medication Sig Dispense Refill  . acetaminophen (TYLENOL) 500 MG tablet Take 1,000 mg by mouth daily as needed for moderate pain or headache.    Marland Kitchen amiodarone (PACERONE) 200 MG tablet TAKE 1/2 (ONE-HALF) TABLET BY MOUTH ONCE DAILY 45 tablet 3  . aspirin EC 81 MG tablet Take 1 tablet (81 mg total) by mouth daily.    Marland Kitchen atorvastatin (LIPITOR) 10 MG tablet Take 1 tablet (10 mg total) by mouth daily. 90 tablet 1  . Cholecalciferol (VITAMIN D) 2000 UNITS tablet Take 2,000 Units by mouth daily.    . famotidine (PEPCID) 20 MG tablet Take 1 tablet (20 mg total) by mouth 2 (two) times daily as needed. 60 tablet 4  . metFORMIN (GLUCOPHAGE) 850 MG tablet Take 1 tablet (850 mg total) by mouth 2 (two)  times daily with a meal. 180 tablet 1  . metoprolol succinate (TOPROL-XL) 100 MG 24 hr tablet TAKE 1 TABLET BY MOUTH ONCE DAILY IMMEDIATELY  FOLLOWING  A  MEAL 90 tablet 3  . olmesartan-hydrochlorothiazide (BENICAR HCT) 20-12.5 MG tablet Take 1 tablet by mouth daily. 90 tablet 1  . Omega-3 Fatty Acids (FISH OIL) 1000 MG CAPS Take 1,000 mg by mouth 2 (two) times daily.     Marland Kitchen OVER THE COUNTER MEDICATION     . tadalafil (CIALIS) 20 MG tablet Take 0.5-1 tablets (10-20 mg total) by mouth daily as needed for erectile dysfunction. 10 tablet 11   No facility-administered medications prior to visit.     PAST MEDICAL HISTORY: Past Medical History:  Diagnosis Date  . 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 Diabetes mellitus without complication ([place])   . Dual implantable cardiac defibrillator in situ    Medtronic D314DRG Protecta  . Elevated transaminase level    10/2011  ? amio  . GERD (gastroesophageal reflux disease)   . HTN (hypertension)   . Other primary cardiomyopathies   . Syncope   . Tubular adenoma of colon   . Ventricular tachycardia ([place])    storm following ICD implant on amiodarone     PAST SURGICAL HISTORY: Past Surgical History:  Procedure Laterality Date  . CARDIAC DEFIBRILLATOR PLACEMENT  Medtronic D314DRG Protecta  . CATARACT EXTRACTION, BILATERAL  2019  . CHOLECYSTECTOMY    . COLONOSCOPY WITH PROPOFOL N/A 06/30/2017   Procedure: COLONOSCOPY WITH PROPOFOL;  Surgeon: Carol Ada, MD;  Location: [facility];  Service: Endoscopy;  Laterality: N/A;  . GALLBLADDER SURGERY    . HERNIA REPAIR    . ICD GENERATOR CHANGEOUT N/A 04/26/2017   Procedure: ICD Generator Changeout;  Surgeon: [name], MD;  Location: [facility];  Service: Cardiovascular;  Laterality: N/A;  . TONSILLECTOMY AND ADENOIDECTOMY      FAMILY HISTORY: Family History  Problem Relation Age of Onset  . Hyperlipidemia Mother   . Heart disease Father   . Colon cancer Neg Hx   . Esophageal  cancer Neg Hx   . Stomach cancer Neg Hx   . Rectal cancer Neg Hx     SOCIAL HISTORY: Social History   Socioeconomic History  . Marital status: Married    Spouse name: Not on file  . Number of children: 0  . Years of education: Not on file  . Highest education level: Bachelor's degree (e.g., BA, AB, BS)  Occupational History  . Occupation: Retired Tour manager  . Financial resource strain: Not hard at all  . Food insecurity    Worry: Never true    Inability: Never true  . Transportation needs    Medical: No    Non-medical: No  Tobacco Use  . Smoking status: Never Smoker  . Smokeless tobacco: Never Used  Substance and Sexual Activity  . Alcohol use: Yes    Alcohol/week: 2.0 standard drinks    Types: 2 Cans of beer per week    Comment: 4/week  . Drug use: No  . Sexual activity: Yes  Lifestyle  . Physical activity    Days per week: 4 days    Minutes per session: 50 min  . Stress: Not at all  Relationships  . Social connections    Talks on phone: More than three times a week    Gets together: More than three times a week    Attends religious service: More than 4 times per year    Active member of club or organization: No    Attends meetings of clubs or organizations: Never    Relationship status: Married  . Intimate partner violence    Fear of current or ex partner: No    Emotionally abused: No    Physically abused: No    Forced sexual activity: No  Other Topics Concern  . Not on file  Social History Narrative   Divorced. Education: [employer]. Exercise: walking 3 times a week for 30 minutes.   Retired Pharmacist, hospital      PHYSICAL EXAM  Vitals:   07/30/19 0758  BP: 127/83  Pulse: (!) 104  Temp: 97.8 F (36.6 C)  TempSrc: Oral  Weight: 191 lb 9.6 oz (86.9 kg)  Height: 5\' 5"  (1.651 m)   Body mass index is 31.88 kg/m.  Generalized: Well developed, in no acute distress  Chest: Lungs clear to auscultation bilaterally  Neurological examination   Mentation: Alert oriented to time, place, history taking. Follows all commands speech and language fluent Cranial nerve II-XII:  Extraocular movements were full, visual field were full on confrontational test.  Head turning and shoulder shrug  were normal and symmetric. Motor: The motor testing reveals 5 over 5 strength of all 4 extremities. Good symmetric motor tone is noted throughout.  Sensory: Sensory testing is intact  to soft touch on all 4 extremities. No evidence of extinction is noted.  Coordination: Cerebellar testing reveals good finger-nose-finger and heel-to-shin bilaterally.  Gait and station: Gait is normal.  Reflexes: Deep tendon reflexes are symmetric and normal bilaterally.   DIAGNOSTIC DATA (LABS, IMAGING, TESTING) - I reviewed patient records, labs, notes, testing and imaging myself where available.  Lab Results  Component Value Date   WBC 4.1 04/26/2017   HGB 13.9 04/26/2017   HCT 40.9 04/26/2017   MCV 87.4 04/26/2017   PLT 140 (L) 04/26/2017      Component Value Date/Time   NA 138 04/23/2019 1158   K 4.2 04/23/2019 1158   CL 99 04/23/2019 1158   CO2 21 04/23/2019 1158   GLUCOSE 213 (H) 04/23/2019 1158   GLUCOSE 163 (H) 04/26/2017 1001   BUN 13 04/23/2019 1158   CREATININE 1.07 04/23/2019 1158   CREATININE 1.19 09/15/2016 1709   CALCIUM 9.2 04/23/2019 1158   PROT 6.8 04/23/2019 1158   ALBUMIN 4.7 04/23/2019 1158   AST 25 04/23/2019 1158   ALT 22 04/23/2019 1158   ALKPHOS 43 04/23/2019 1158   BILITOT 0.6 04/23/2019 1158   GFRNONAA 70 04/23/2019 1158   GFRNONAA 77 03/02/2016 1143   GFRAA 81 04/23/2019 1158   GFRAA 89 03/02/2016 1143   Lab Results  Component Value Date   CHOL 112 04/23/2019   HDL 30 (L) 04/23/2019   LDLCALC 28 04/23/2019   LDLDIRECT 90.5 08/06/2013   TRIG 272 (H) 04/23/2019   CHOLHDL 3.7 04/23/2019   Lab Results  Component Value Date   HGBA1C 8.1 (A) 04/23/2019   No results found for: [id number] Lab Results  Component Value  Date   TSH 3.250 01/28/2019      ASSESSMENT AND PLAN 68 y.o. year old male  has a past medical history of Anxiety, Diabetes mellitus without complication ([place]), Dual implantable cardiac defibrillator in situ, Elevated transaminase level, GERD (gastroesophageal reflux disease), HTN (hypertension), Other primary cardiomyopathies, Syncope, Tubular adenoma of colon, and Ventricular tachycardia ([place]). here with:  1.  Obstructive sleep apnea on CPAP  The patient's CPAP download shows excellent compliance and good treatment of his apnea.  He is encouraged to continue using CPAP nightly and greater than 4 hours each night.  He is advised that if his symptoms worsen or he develops new symptoms he should let us know.  He will follow-up in 1 year or sooner if needed    I spent 15 minutes with the patient. 50% of this time was spent reviewing CPAP download   Ward Givens, MSN, NP-C 07/30/2019, 8:35 AM [facility] [address], [place], [place] [address] [phone]

## 2019-07-30 NOTE — Patient Instructions (Signed)
Continue using CPAP nightly and greater than 4 hours each night °If your symptoms worsen or you develop new symptoms please let us know.  ° °

## 2019-08-05 NOTE — Progress Notes (Signed)
Cardiology Office Note Date:  08/07/2019  Patient ID:  Dakota Gutierrez, Dakota Gutierrez [birth year], MRN [id number] PCP:  Wendie Agreste, MD  Cardiologist:  Dr. Caryl Comes   Chief Complaint:  routine visit  History of Present Illness: Dakota Gutierrez is a 68 y.o. male with history of VT with ICD, HTN, DM, GERD, retinal art occlusion, OSA w/CPAP.  April 2017 had  nonsustained ventricular tachycardia and easily inducible to polymorphic ventricular tachycardia and received an ICD. Unfortunately, in the days that followed he developed VT storm with multiple shocks and was treated with amiodarone which has been gradually down titrated to 100 mg a day.  Amiodarone surveillance had demonstrated elevated liver function tests spring 2013 . Was elected to discontinue the amiodarone albeit with some reluctance on the part of the family. When seen in June of recurrent episodes of ventricular tachycardia 2 of which required therapy. He was then resumed on amiodarone and referred for consultation with GI  most recently 4/17 transaminases were normal".  He was seen by myself in October 2017, his last echo noted mild decrease in LV function and his lopressor changed to Toprol.  He reported feeling pretty well.  He denied any CP, states he gets a discomfort in his chest when he has over eaten, this is chronic and unchanged for years, no palpitations, no SOB.  He denies any dizziness, near syncope or syncope.  His device check that visit noted appropriate tx w/ATP for VT, his Toprol was increased and following w/GI for hx of abn LFTs/amiodarone  He saw Dr. 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  He had ATP x2 for VT, no med changes and planned for gen change.  He saw him Sep 2018, change no VT, concerns of incisional abcess, and rx a course of antibiotics with plans to f/u in 2 weeks.   He comes today to be seen for Dr. Caryl Comes, last seen  by him March 2020, no recurrent VT.  Unclear discussion on salt supplements?  He comes today with concern about call regarding abnormal remote.  He feels well.  Denies any cardiac awareness at all.  He denies any CP, palpitations, or SOB, no exertional intolerances, no near syncope or syncope.   He had a dizzy spell one evening that he attributed to his BS.  This does NOT correlate with day or time of his  Monitored VT event.  07/20/2019 he had a monitored VT episode 140bpm, 3 minutes in duration (at least).  [place] the beach, shad not had any [place] that day, would have been just going to bed, but not asleep at the time of the event.  He had no symptoms.  He denies any missed doses of any of his medicines.  Device history:  MDT dual chamber ICD, implanted 04/28/11,  gen change may 2018, Dr. Caryl Comes, primary prevention +++ hx of appropriate therapy AAD: amiodarone Hx of abn LFTs on amiodarone, was stopped and resumed 2/2 recurrent VT   Past Medical History:  Diagnosis Date  . 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 Diabetes mellitus without complication ([place])   . Dual implantable cardiac defibrillator in situ    Medtronic D314DRG Protecta  . Elevated transaminase level    10/2011  ? amio  . GERD (gastroesophageal reflux disease)   . HTN (hypertension)   . Other primary cardiomyopathies   . Syncope   . Tubular  adenoma of colon   . Ventricular tachycardia ([place])    storm following ICD implant on amiodarone     Past Surgical History:  Procedure Laterality Date  . CARDIAC DEFIBRILLATOR PLACEMENT     Medtronic D314DRG Protecta  . CATARACT EXTRACTION, BILATERAL  2019  . CHOLECYSTECTOMY    . COLONOSCOPY WITH PROPOFOL N/A 06/30/2017   Procedure: COLONOSCOPY WITH PROPOFOL;  Surgeon: Carol Ada, MD;  Location: [facility];  Service: Endoscopy;  Laterality: N/A;  . GALLBLADDER SURGERY    . HERNIA REPAIR    . ICD GENERATOR CHANGEOUT N/A 04/26/2017   Procedure: ICD Generator Changeout;   Surgeon: [name], MD;  Location: [facility];  Service: Cardiovascular;  Laterality: N/A;  . TONSILLECTOMY AND ADENOIDECTOMY      Current Outpatient Medications  Medication Sig Dispense Refill  . acetaminophen (TYLENOL) 500 MG tablet Take 1,000 mg by mouth daily as needed for moderate pain or headache.    Marland Kitchen amiodarone (PACERONE) 200 MG tablet TAKE 1/2 (ONE-HALF) TABLET BY MOUTH ONCE DAILY 45 tablet 3  . aspirin EC 81 MG tablet Take 1 tablet (81 mg total) by mouth daily.    Marland Kitchen atorvastatin (LIPITOR) 10 MG tablet Take 1 tablet (10 mg total) by mouth daily. 90 tablet 1  . Cholecalciferol (VITAMIN D) 2000 UNITS tablet Take 2,000 Units by mouth daily.    . famotidine (PEPCID) 20 MG tablet Take 1 tablet (20 mg total) by mouth 2 (two) times daily as needed. 60 tablet 4  . metFORMIN (GLUCOPHAGE) 850 MG tablet Take 1 tablet (850 mg total) by mouth 2 (two) times daily with a meal. 180 tablet 1  . metoprolol succinate (TOPROL-XL) 100 MG 24 hr tablet TAKE 1 TABLET BY MOUTH ONCE DAILY IMMEDIATELY  FOLLOWING  A  MEAL 90 tablet 3  . olmesartan-hydrochlorothiazide (BENICAR HCT) 20-12.5 MG tablet Take 1 tablet by mouth daily. 90 tablet 1  . Omega-3 Fatty Acids (FISH OIL) 1000 MG CAPS Take 1,000 mg by mouth 2 (two) times daily.     Marland Kitchen OVER THE COUNTER MEDICATION     . tadalafil (CIALIS) 20 MG tablet Take 0.5-1 tablets (10-20 mg total) by mouth daily as needed for erectile dysfunction. 10 tablet 11   No current facility-administered medications for this visit.     Allergies:   Adhesive [tape]   Social History:  The patient  reports that he has never smoked. He has never used smokeless tobacco. He reports current alcohol use of about 2.0 standard drinks of alcohol per week. He reports that he does not use drugs.   Family History:  The patient's family history includes Heart disease in his father; Hyperlipidemia in his mother.  ROS:  Please see the history of present illness.  All other systems  are reviewed and otherwise negative.   PHYSICAL EXAM:  VS:  BP (!) 144/74   Pulse (!) 103   Ht 5\' 5"  (1.651 m)   Wt 188 lb (85.3 kg)   BMI 31.28 kg/m  BMI: Body mass index is 31.28 kg/m. Well nourished, well developed, in no acute distress  HEENT: normocephalic, atraumatic  Neck: no JVD, carotid bruits or masses Cardiac: RRR; no significant murmurs, no rubs, or gallops Lungs:  CTA b/l, no wheezing, rhonchi or rales  Abd: soft, nontender MS: no deformity or atrophy Ext: no edema  Skin: warm and dry, no rash Neuro:  No gross deficits appreciated, known left eye visual field deficit  Psych: euthymic mood, full affect  ICD site is stable, no tethering or discomfort, skin is intact, no fluid collection, fluctuation   EKG:  AP/VS 90bpm ICD interrogation today and reviewed by myself: Battery and lead measurements are good. 1 monitored VT episode, 2min13 seconds, 440-482ms (130-136bpm), though onset looks to be well before detection and likely was longer NSVT otherwise only   04/07/16: TTE Study Conclusions - Left ventricle: The cavity size was mildly dilated. Wall   thickness was normal. Systolic function was mildly reduced. The   estimated ejection fraction was in the range of 45% to 50%.   Diffuse hypokinesis. Doppler parameters are consistent with   abnormal left ventricular relaxation (grade 1 diastolic   dysfunction). The E/e&' ratio is between 8-15, suggesting   indeterminate LV filling pressure. - Left atrium: The atrium was mildly dilated. - Right ventricle: The cavity size was normal. Pacer wire or   catheter noted in right ventricle. Systolic function is reduced. - Right atrium: The atrium was normal in size. Pacer wire or   catheter noted in right atrium. - Inferior vena cava: The vessel was normal in size. The   respirophasic diameter changes were in the normal range (>= 50%),   consistent with normal central venous pressure. - Pericardium, extracardiac: A trivial  pericardial effusion was   identified. Impressions: - LVEF 45-50%, diffuse hypokinesis, normal wall thickness with   dilated LV cavity, diastolic dysfunction, indeterminate LV   filling pressure, mildly dilated LA, RV/RA AICD wires noted,   reduced RV systolic function, trivial pericardial effusion.  Myoview 2012-no ischemia ejection fraction 56%    Recent Labs: 01/28/2019: TSH 3.250 04/23/2019: ALT 22; BUN 13; Creatinine, Ser 1.07; Potassium 4.2; Sodium 138  04/23/2019: Chol/HDL Ratio 3.7; Cholesterol, Total 112; HDL 30; LDL Calculated 28; Triglycerides 272   CrCl cannot be calculated (Patient's most recent lab result is older than the maximum 21 days allowed.).   Wt Readings from Last 3 Encounters:  08/07/19 188 lb (85.3 kg)  07/30/19 191 lb 9.6 oz (86.9 kg)  04/23/19 191 lb 9.6 oz (86.9 kg)     Other studies reviewed: Additional studies/records reviewed today include: summarized above  ASSESSMENT AND PLAN:  1. VT, polymorphic w/ICD     On amiodarone, labs up to date, no pulm symptoms  Slow VT in the monitor zone, no symptoms For now, have not made any medication changes.  He had no symptoms He has had some issues with amio in the past and do not want to uptitrate yet, BB will only slow his VT further  Will have him scheduled for 1 month remote, discussed symptoms to make [name] aware of if he develops any   2. Mild NICM     on BB/ARB/diuretic     No symptoms or exam findings to suggest fluid OL, OptiVol looks good         3. HTN        No changes made today, he reports home BPs generally 120's/70's    Disposition: as above, remote in 66mo, in clinic in 3 mo, sooner if needed.    Current medicines are reviewed at length with the patient today.  The patient did not have any concerns regarding medicines.  Haywood Lasso, PA-C 08/07/2019 1:31 PM     [place] [address] [phone] (office)  [phone]  (fax)

## 2019-08-07 ENCOUNTER — Encounter: Payer: Self-pay | Admitting: Physician Assistant

## 2019-08-07 ENCOUNTER — Other Ambulatory Visit: Payer: Self-pay

## 2019-08-07 ENCOUNTER — Encounter: Payer: Self-pay | Admitting: Cardiology

## 2019-08-07 ENCOUNTER — Ambulatory Visit (INDEPENDENT_AMBULATORY_CARE_PROVIDER_SITE_OTHER): Payer: Medicare Other | Admitting: Physician Assistant

## 2019-08-07 VITALS — BP 144/74 | HR 103 | Ht 65.0 in | Wt 188.0 lb

## 2019-08-07 DIAGNOSIS — I10 Essential (primary) hypertension: Secondary | ICD-10-CM | POA: Diagnosis not present

## 2019-08-07 DIAGNOSIS — I472 Ventricular tachycardia, unspecified: Secondary | ICD-10-CM

## 2019-08-07 DIAGNOSIS — Z95810 Presence of automatic (implantable) cardiac defibrillator: Secondary | ICD-10-CM | POA: Diagnosis not present

## 2019-08-07 DIAGNOSIS — I428 Other cardiomyopathies: Secondary | ICD-10-CM | POA: Diagnosis not present

## 2019-08-07 NOTE — Progress Notes (Signed)
Remote ICD transmission.   

## 2019-08-07 NOTE — Patient Instructions (Signed)
Medication Instructions:  Your physician recommends that you continue on your current medications as directed. Please refer to the Current Medication list given to you today.  If you need a refill on your cardiac medications before your next appointment, please call your pharmacy.   Lab work: NONE ORDERED  TODAY   If you have labs (blood work) drawn today and your tests are completely normal, you will receive your results only by: Marland Kitchen MyChart Message (if you have MyChart) OR . A paper copy in the mail If you have any lab test that is abnormal or we need to change your treatment, we will call you to review the results.  Testing/Procedures: NONE ORDERED  TODAY    Follow-Up: At [facility], you and your health needs are our priority.  As part of our continuing mission to provide you with exceptional heart care, we have created designated Provider Care Teams.  These Care Teams include your primary Cardiologist (physician) and Advanced Practice Providers (APPs -  Physician Assistants and Nurse Practitioners) who all work together to provide you with the care you need, when you need it. You will need a follow up appointment in 3 months.  Please call our office 2 months in advance to schedule this appointment.  You may see Dr. Caryl Comes  or one of the following Advanced Practice Providers on your designated Care Team:   Chanetta Marshall, NP . Tommye Standard, PA-C  Any Other Special Instructions Will Be Listed Below (If Applicable).

## 2019-08-14 ENCOUNTER — Ambulatory Visit (INDEPENDENT_AMBULATORY_CARE_PROVIDER_SITE_OTHER): Payer: Medicare Other | Admitting: Family Medicine

## 2019-08-14 ENCOUNTER — Other Ambulatory Visit: Payer: Self-pay

## 2019-08-14 ENCOUNTER — Encounter: Payer: Self-pay | Admitting: Family Medicine

## 2019-08-14 VITALS — BP 122/71 | HR 102 | Temp 98.6°F | Resp 14 | Wt 188.4 lb

## 2019-08-14 DIAGNOSIS — Z23 Encounter for immunization: Secondary | ICD-10-CM | POA: Diagnosis not present

## 2019-08-14 DIAGNOSIS — E1165 Type 2 diabetes mellitus with hyperglycemia: Secondary | ICD-10-CM | POA: Diagnosis not present

## 2019-08-14 NOTE — Patient Instructions (Addendum)
Depending on A1c, and tolerability of metformin, could try lower dose of metformin and adding different med such as [place] or [place], or [place].   recheck in 3 months. Keep up the good work with the diet.    If you have lab work done today you will be contacted with your lab results within the next 2 weeks.  If you have not heard from [name] then please contact us. The fastest way to get your results is to register for My Chart.   IF you received an x-ray today, you will receive an invoice from [facility]. Please contact [facility] at [phone] with questions or concerns regarding your invoice.   IF you received labwork today, you will receive an invoice from [facility]. Please contact [facility] at [phone] with questions or concerns regarding your invoice.   Our billing staff will not be able to assist you with questions regarding bills from these companies.  You will be contacted with the lab results as soon as they are available. The fastest way to get your results is to activate your My Chart account. Instructions are located on the last page of this paperwork. If you have not heard from [name] regarding the results in 2 weeks, please contact this office.

## 2019-08-14 NOTE — Progress Notes (Signed)
Subjective:    Patient ID: Dakota Gutierrez, male    DOB: 1951-07-29, 68 y.o.   MRN: [id number]  HPI Dakota Gutierrez is a 68 y.o. male Presents today for: Chief Complaint  Patient presents with  . chronic medical condition    reckeck  A1c   Diabetes: Complicated by hyperglycemia. Decreased control in May, metformin increased to 850 mg twice daily.  He is on a statin as well as ARB.  Had diarrhea with higher dose of metformin at 850mg   - stopped for a week, then restarted 1 at dinner and 1/2 at breakfast. Less diarrhea, still occasional. Rare diarrhea at 500mg  BID.  Has improved diet over past 5 weeks. Readings over 200's until diet changes, now 131, 115, 120's 114 past week.  No symptomatic lows.    Microalbumin:nl in 09/2018.  Optho, foot exam, pneumovax: up to date.   Lab Results  Component Value Date   HGBA1C 8.1 (A) 04/23/2019   HGBA1C 9.8 (H) 01/23/2019   HGBA1C 9.8 (A) 01/23/2019   Lab Results  Component Value Date   MICROALBUR 0.7 09/15/2016   LDLCALC 28 04/23/2019   CREATININE 1.07 04/23/2019   Wt Readings from Last 3 Encounters:  08/14/19 188 lb 6.4 oz (85.5 kg)  08/07/19 188 lb (85.3 kg)  07/30/19 191 lb 9.6 oz (86.9 kg)       Patient Active Problem List   Diagnosis Date Noted  . Type II diabetes mellitus ([place]) 01/16/2015  . Hypersomnolence 08/14/2012  . History of Helicobacter pylori infection 04/16/2012  . GERD (gastroesophageal reflux disease) 04/16/2012  . Elevated transaminase level 02/02/2012  . Obesity 12/29/2011  . 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 Syncope   . Ventricular tachycardia ([place])   . Anxiety    Past Medical History:  Diagnosis Date  . 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 Diabetes mellitus without complication ([place])   . Dual implantable cardiac defibrillator in situ    Medtronic [id number] Protecta  . Elevated transaminase level    10/2011  ? amio  . GERD (gastroesophageal reflux  disease)   . HTN (hypertension)   . Other primary cardiomyopathies   . Syncope   . Tubular adenoma of colon   . Ventricular tachycardia ([place])    storm following ICD implant on amiodarone    Past Surgical History:  Procedure Laterality Date  . CARDIAC DEFIBRILLATOR PLACEMENT     Medtronic [id number] Protecta  . CATARACT EXTRACTION, BILATERAL  2019  . CHOLECYSTECTOMY    . COLONOSCOPY WITH PROPOFOL N/A 06/30/2017   Procedure: COLONOSCOPY WITH PROPOFOL;  Surgeon: Carol Ada, MD;  Location: [facility];  Service: Endoscopy;  Laterality: N/A;  . GALLBLADDER SURGERY    . HERNIA REPAIR    . ICD GENERATOR CHANGEOUT N/A 04/26/2017   Procedure: ICD Generator Changeout;  Surgeon: [name], MD;  Location: [facility];  Service: Cardiovascular;  Laterality: N/A;  . TONSILLECTOMY AND ADENOIDECTOMY     Allergies  Allergen Reactions  . Adhesive [Tape]     Swelling and welts formed   Prior to Admission medications   Medication Sig Start Date End Date Taking? Authorizing Provider  acetaminophen (TYLENOL) 500 MG tablet Take 1,000 mg by mouth daily as needed for moderate pain or headache.   Yes [provider]  amiodarone (PACERONE) 200 MG tablet TAKE 1/2 (ONE-HALF) TABLET BY MOUTH ONCE DAILY 01/28/19  Yes [name], MD  aspirin EC 81 MG tablet  Take 1 tablet (81 mg total) by mouth daily. 11/11/11  Yes [name], MD  atorvastatin (LIPITOR) 10 MG tablet Take 1 tablet (10 mg total) by mouth daily. 04/23/19  Yes Wendie Agreste, MD  Cholecalciferol (VITAMIN D) 2000 UNITS tablet Take 2,000 Units by mouth daily.   Yes [provider]  famotidine (PEPCID) 20 MG tablet Take 1 tablet (20 mg total) by mouth 2 (two) times daily as needed. 04/16/12  Yes Pyrtle, Lajuan Lines, MD  metFORMIN (GLUCOPHAGE) 850 MG tablet Take 1 tablet (850 mg total) by mouth 2 (two) times daily with a meal. 04/23/19  Yes Wendie Agreste, MD  metoprolol succinate (TOPROL-XL) 100 MG 24 hr tablet TAKE 1  TABLET BY MOUTH ONCE DAILY IMMEDIATELY  FOLLOWING  A  MEAL 09/19/18  Yes [name], MD  olmesartan-hydrochlorothiazide (BENICAR HCT) 20-12.5 MG tablet Take 1 tablet by mouth daily. 04/23/19  Yes Wendie Agreste, MD  Omega-3 Fatty Acids (FISH OIL) 1000 MG CAPS Take 1,000 mg by mouth 2 (two) times daily.    Yes [provider]  OVER THE COUNTER MEDICATION    Yes [provider]  tadalafil (CIALIS) 20 MG tablet Take 0.5-1 tablets (10-20 mg total) by mouth daily as needed for erectile dysfunction. 09/15/16  Yes Wendie Agreste, MD   Social History   Socioeconomic History  . Marital status: Married    Spouse name: Not on file  . Number of children: 0  . Years of education: Not on file  . Highest education level: Bachelor's degree (e.g., BA, AB, BS)  Occupational History  . Occupation: Retired Tour manager  . Financial resource strain: Not hard at all  . Food insecurity    Worry: Never true    Inability: Never true  . Transportation needs    Medical: No    Non-medical: No  Tobacco Use  . Smoking status: Never Smoker  . Smokeless tobacco: Never Used  Substance and Sexual Activity  . Alcohol use: Yes    Alcohol/week: 2.0 standard drinks    Types: 2 Cans of beer per week    Comment: 4/week  . Drug use: No  . Sexual activity: Yes  Lifestyle  . Physical activity    Days per week: 4 days    Minutes per session: 50 min  . Stress: Not at all  Relationships  . Social connections    Talks on phone: More than three times a week    Gets together: More than three times a week    Attends religious service: More than 4 times per year    Active member of club or organization: No    Attends meetings of clubs or organizations: Never    Relationship status: Married  . Intimate partner violence    Fear of current or ex partner: No    Emotionally abused: No    Physically abused: No    Forced sexual activity: No  Other Topics Concern  . Not on file   Social History Narrative   Divorced. Education: [employer]. Exercise: walking 3 times a week for 30 minutes.   Retired Pharmacist, hospital    Review of Systems  Constitutional: Negative for fatigue and unexpected weight change.  Eyes: Negative for visual disturbance.  Respiratory: Negative for cough, chest tightness and shortness of breath.   Cardiovascular: Negative for chest pain, palpitations and leg swelling.  Gastrointestinal: Negative for abdominal pain and blood in stool.  Neurological: Negative for dizziness, light-headedness  and headaches.       Objective:   Physical Exam Vitals signs reviewed.  Constitutional:      Appearance: He is well-developed.  HENT:     Head: Normocephalic and atraumatic.  Eyes:     Pupils: Pupils are equal, round, and reactive to light.  Neck:     Vascular: No carotid bruit or JVD.  Cardiovascular:     Rate and Rhythm: Normal rate and regular rhythm.     Heart sounds: Normal heart sounds. No murmur.  Pulmonary:     Effort: Pulmonary effort is normal.     Breath sounds: Normal breath sounds. No rales.  Skin:    General: Skin is warm and dry.  Neurological:     Mental Status: He is alert and oriented to person, place, and time.    Vitals:   08/14/19 1120  BP: 122/71  Pulse: (!) 102  Resp: 14  Temp: 98.6 F (37 C)  TempSrc: Oral  SpO2: 97%  Weight: 188 lb 6.4 oz (85.5 kg)          Assessment & Plan:  Dakota Gutierrez is a 68 y.o. male Type 2 diabetes mellitus with hyperglycemia, without long-term current use of insulin ([place]) - Plan: Hemoglobin A1c  Need for prophylactic vaccination and inoculation against influenza - Plan: Flu Vaccine QUAD High Dose(Fluad)   Difficulty tolerating metformin 850 mg twice daily but has been doing okay with half dosing of metformin in the morning, full dose at night.  Check A1c, then consider changing dose to lower amount of metformin, possible addition of SGLT2 or DPP 4.   No orders of the defined types  were placed in this encounter.  Patient Instructions   Depending on A1c, and tolerability of metformin, could try lower dose of metformin and adding different med such as [place] or [place], or [place].   recheck in 3 months. Keep up the good work with the diet.    If you have lab work done today you will be contacted with your lab results within the next 2 weeks.  If you have not heard from [name] then please contact us. The fastest way to get your results is to register for My Chart.   IF you received an x-ray today, you will receive an invoice from [facility]. Please contact [facility] at [phone] with questions or concerns regarding your invoice.   IF you received labwork today, you will receive an invoice from [place]. Please contact [facility] at [phone] with questions or concerns regarding your invoice.   Our billing staff will not be able to assist you with questions regarding bills from these companies.  You will be contacted with the lab results as soon as they are available. The fastest way to get your results is to activate your My Chart account. Instructions are located on the last page of this paperwork. If you have not heard from [name] regarding the results in 2 weeks, please contact this office.       Signed,   [name], MD [facility].  08/14/19 7:38 PM

## 2019-08-15 LAB — HEMOGLOBIN A1C
Est. average glucose Bld gHb Est-mCnc: 177 mg/dL
Hgb A1c MFr Bld: 7.8 % — ABNORMAL HIGH (ref 4.8–5.6)

## 2019-09-05 ENCOUNTER — Ambulatory Visit (INDEPENDENT_AMBULATORY_CARE_PROVIDER_SITE_OTHER): Payer: Medicare Other | Admitting: *Deleted

## 2019-09-05 DIAGNOSIS — I428 Other cardiomyopathies: Secondary | ICD-10-CM

## 2019-09-05 LAB — CUP PACEART REMOTE DEVICE CHECK
Battery Remaining Longevity: 77 mo
Battery Voltage: 2.98 V
Brady Statistic AP VP Percent: 22.65 %
Brady Statistic AP VS Percent: 66.86 %
Brady Statistic AS VP Percent: 0.02 %
Brady Statistic AS VS Percent: 10.47 %
Brady Statistic RA Percent Paced: 88.65 %
Brady Statistic RV Percent Paced: 22.53 %
Date Time Interrogation Session: 20201008082505
HighPow Impedance: 38 Ohm
HighPow Impedance: 45 Ohm
Implantable Lead Implant Date: 20120531
Implantable Lead Implant Date: 20120531
Implantable Lead Location: 753859
Implantable Lead Location: 753860
Implantable Lead Model: 4076
Implantable Lead Model: 6947
Implantable Pulse Generator Implant Date: 20180530
Lead Channel Impedance Value: 304 Ohm
Lead Channel Impedance Value: 399 Ohm
Lead Channel Impedance Value: 418 Ohm
Lead Channel Pacing Threshold Amplitude: 0.5 V
Lead Channel Pacing Threshold Amplitude: 0.625 V
Lead Channel Pacing Threshold Pulse Width: 0.4 ms
Lead Channel Pacing Threshold Pulse Width: 0.4 ms
Lead Channel Sensing Intrinsic Amplitude: 10.375 mV
Lead Channel Sensing Intrinsic Amplitude: 10.375 mV
Lead Channel Sensing Intrinsic Amplitude: 2 mV
Lead Channel Sensing Intrinsic Amplitude: 2 mV
Lead Channel Setting Pacing Amplitude: 1.5 V
Lead Channel Setting Pacing Amplitude: 2 V
Lead Channel Setting Pacing Pulse Width: 0.4 ms
Lead Channel Setting Sensing Sensitivity: 0.3 mV

## 2019-09-06 ENCOUNTER — Encounter: Payer: Self-pay | Admitting: Cardiology

## 2019-09-06 NOTE — Progress Notes (Signed)
Remote ICD transmission.   

## 2019-09-10 ENCOUNTER — Ambulatory Visit: Payer: Medicare Other | Admitting: Adult Health

## 2019-09-17 ENCOUNTER — Telehealth: Payer: Self-pay | Admitting: Internal Medicine

## 2019-09-17 NOTE — Telephone Encounter (Signed)
Spoke with the pts wife Dakota Gutierrez (on [place]), and endorsed to her all recommendations provided by our Pharmacist Marcelle Overlie, about safe pain management for the pts  back pain, that coincides with his cardiac history and cardiac medications.  Pts wife wrote down everything that the Pharmacist suggested.  Wife states that the pt has an appt today at [facility] for follow-up of his back pain.  Wife states she will provide the Orthopedic Doctor with all our suggestions made, and the list of safe options our Pharmacist provided.  Wife verbalized understanding and agrees with this plan. Wife was more than gracious for all the assistance provided.

## 2019-09-17 NOTE — Telephone Encounter (Signed)
Will route this message to our Pharmacy pool to have them further review and advise on what the pt can safely take  for his back pain, given his cardiac meds, history, and labs.  Triage to follow-up accordingly thereafter, once further recommendations are recieved.

## 2019-09-17 NOTE — Telephone Encounter (Signed)
Patient's wife called stating she is taking him to [facility] back pain. She wants to know what he can take for pain, since he is on so many medications.

## 2019-09-17 NOTE — Telephone Encounter (Signed)
He may take acetaminophen (Tylenol). No more than a total of 3000mg  per day.   He should try to avoid NSAIDs such as ibuprofen (Advil), naproxen (Aleve), meloxicam, Celebrex due to his high blood pressure and potential to increase in cardiovascular events. A short course of NSAIDS could be used if the ortho doctor feels as though he needs them. But I would use the lowest dose for the shortest amount of time.  Short course of opiods such as tramadol, Norco are ok if deemed appropriate based off of cause/severity of pain by orthopedic dr.

## 2019-09-18 NOTE — Telephone Encounter (Signed)
Man  Are you guys superstars or what  Thanks SK

## 2019-09-19 ENCOUNTER — Ambulatory Visit: Payer: Medicare Other | Admitting: Family Medicine

## 2019-09-29 ENCOUNTER — Other Ambulatory Visit: Payer: Self-pay | Admitting: Internal Medicine

## 2019-09-29 DIAGNOSIS — I472 Ventricular tachycardia, unspecified: Secondary | ICD-10-CM

## 2019-09-29 DIAGNOSIS — I10 Essential (primary) hypertension: Secondary | ICD-10-CM

## 2019-10-02 ENCOUNTER — Other Ambulatory Visit: Payer: Self-pay

## 2019-10-02 ENCOUNTER — Other Ambulatory Visit: Payer: Self-pay | Admitting: Internal Medicine

## 2019-10-02 ENCOUNTER — Encounter: Payer: Self-pay | Admitting: Family Medicine

## 2019-10-02 ENCOUNTER — Ambulatory Visit (INDEPENDENT_AMBULATORY_CARE_PROVIDER_SITE_OTHER): Payer: Medicare Other | Admitting: Family Medicine

## 2019-10-02 VITALS — BP 134/81 | HR 104 | Temp 98.3°F | Wt 179.6 lb

## 2019-10-02 DIAGNOSIS — M5441 Lumbago with sciatica, right side: Secondary | ICD-10-CM | POA: Diagnosis not present

## 2019-10-02 DIAGNOSIS — M25561 Pain in right knee: Secondary | ICD-10-CM

## 2019-10-02 DIAGNOSIS — I10 Essential (primary) hypertension: Secondary | ICD-10-CM

## 2019-10-02 DIAGNOSIS — I472 Ventricular tachycardia, unspecified: Secondary | ICD-10-CM

## 2019-10-02 MED ORDER — METOPROLOL SUCCINATE ER 100 MG PO TB24: ORAL_TABLET | ORAL | 3 refills | Status: DC

## 2019-10-02 MED ORDER — TRAMADOL HCL 50 MG PO TABS: 50.0000 mg | ORAL_TABLET | Freq: Three times a day (TID) | ORAL | 0 refills | Status: AC | PRN

## 2019-10-02 NOTE — Patient Instructions (Addendum)
I did place a referral to Dr. Stann Mainland for knee pain.  I suspect there may be a component of arthritis or meniscus issues.  If they decide to do another Medrol Dosepak, that may help knee pain, but for now use the hinged knee brace, Tylenol over-the-counter..   I did refill the tramadol for now for back pain and knee pain if needed, but please coordinate next step with orthopedist.  As we discussed if there is any true muscular weakness, or incontinence, be seen right away for the back pain.  X-ray was ordered at [place] on [address].  I will let you know if there are any concerns once I receive the report.    Acute Knee Pain, Adult Acute knee pain is sudden and may be caused by damage, swelling, or irritation of the muscles and tissues that support your knee. The injury may result from:  A fall.  An injury to your knee from twisting motions.  A hit to the knee.  Infection. Acute knee pain may go away on its own with time and rest. If it does not, your health care provider may order tests to find the cause of the pain. These may include:  Imaging tests, such as an X-ray, MRI, or ultrasound.  Joint aspiration. In this test, fluid is removed from the knee.  Arthroscopy. In this test, a lighted tube is inserted into the knee and an image is projected onto a TV screen.  Biopsy. In this test, a sample of tissue is removed from the body and studied under a microscope. Follow these instructions at home: Pay attention to any changes in your symptoms. Take these actions to relieve your pain. If you have a knee sleeve or brace:   Wear the sleeve or brace as told by your health care provider. Remove it only as told by your health care provider.  Loosen the sleeve or brace if your toes tingle, become numb, or turn cold and blue.  Keep the sleeve or brace clean.  If the sleeve or brace is not waterproof: ? Do not let it get wet. ? Cover it with a watertight covering when you  take a bath or shower. Activity  Rest your knee.  Do not do things that cause pain or make pain worse.  Avoid high-impact activities or exercises, such as running, jumping rope, or doing jumping jacks.  Work with a physical therapist to make a safe exercise program, as recommended by your health care provider. Do exercises as told by your physical therapist. Managing pain, stiffness, and swelling   If directed, put ice on the knee: ? Put ice in a plastic bag. ? Place a towel between your skin and the bag. ? Leave the ice on for 20 minutes, 2-3 times a day.  If directed, use an elastic bandage to put pressure (compression) on your injured knee. This may control swelling, give support, and help with discomfort. General instructions  Take over-the-counter and prescription medicines only as told by your health care provider.  Raise (elevate) your knee above the level of your heart when you are sitting or lying down.  Sleep with a pillow under your knee.  Do not use any products that contain nicotine or tobacco, such as cigarettes, e-cigarettes, and chewing tobacco. These can delay healing. If you need help quitting, ask your health care provider.  If you are overweight, work with your health care provider and a dietitian to set a weight-loss goal that is  healthy and reasonable for you. Extra weight can put pressure on your knee.  Keep all follow-up visits as told by your health care provider. This is important. Contact a health care provider if:  Your knee pain continues, changes, or gets worse.  You have a fever along with knee pain.  Your knee feels warm to the touch.  Your knee buckles or locks up. Get help right away if:  Your knee swells, and the swelling becomes worse.  You cannot move your knee.  You have severe pain in your knee. Summary  Acute knee pain can be caused by a fall, an injury, an infection, or damage, swelling, or irritation of the tissues that  support your knee.  Your health care provider may perform tests to find out the cause of the pain.  Pay attention to any changes in your symptoms. Relieve your pain with rest, medicines, light activity, and use of ice.  Get help if your pain continues or becomes worse, your knee swells, or you cannot move your knee. This information is not intended to replace advice given to you by your health care provider. Make sure you discuss any questions you have with your health care provider. Document Released: 09/11/2007 Document Revised: 04/26/2018 Document Reviewed: 04/26/2018 Elsevier Patient Education  [employer].   If you have lab work done today you will be contacted with your lab results within the next 2 weeks.  If you have not heard from [name] then please contact us. The fastest way to get your results is to register for My Chart.   IF you received an x-ray today, you will receive an invoice from [facility]. Please contact [facility] at [phone] with questions or concerns regarding your invoice.   IF you received labwork today, you will receive an invoice from [place]. Please contact [facility] at [phone] with questions or concerns regarding your invoice.   Our billing staff will not be able to assist you with questions regarding bills from these companies.  You will be contacted with the lab results as soon as they are available. The fastest way to get your results is to activate your My Chart account. Instructions are located on the last page of this paperwork. If you have not heard from [name] regarding the results in 2 weeks, please contact this office.

## 2019-10-02 NOTE — Progress Notes (Signed)
Subjective:  Patient ID: Dakota Gutierrez, male    DOB: [birth year]  Age: 68 y.o. MRN: [id number]  CC:  Chief Complaint  Patient presents with  . Knee Pain    right knee pain lower back pain no known injury for 1 month   . Back Pain    was seen by ortho 10-21 for back pain pt doing physical therapy for 3 weeks     HPI Dakota Gutierrez presents for   Right knee, low back pain.  Seen by orthopedics, [facility], October 21 for the back.  Currently in physical therapy for 3 weeks.  Back pain Started at end of September. Gradual onset of pain. Initially R low back. No prior surgery/injection of back.  Prior stretches and chiropractic treatment. Monthly maintenance at chiropractor.  More driving [id number], more sore after returned. Saw ortho 10/21 Dakota Gutierrez. xrays of back obtained. Planned initial medrol dose pak, physical therapy trial for 6 weeks. 6 week recheck. Tramadol for pain - #15 - took BID. Ran out. Helped pain on tramadol, no relief with tylenol. Difficulty sleeping d/t back pain.  No bowel or bladder incontinence, no saddle anesthesia, no lower extremity weakness, but R knee feel different. Pain shooting down R leg, no change with medrol dosepak.  Not as severe after medrol dosepak. Asked ortho about second dosepak.  S/p 3 episodes PT.  Has not tried mm relaxant.   R knee pain Pain for years, old football injury. Pain only, rare giving way in past. More knee pain than in past - all over knee. Burning pain. Some mechanical sx's recently, yesterday.  ? Thought that back contributing to knee pain. Radiating pain fom back/R hip to front of leg to knee, does not cross knee. No change in knee pain with medrol dose pak.  Some swelling initially?, not now.   Using cane to walk. Neoprene brace. Controlled substance database (PDMP) reviewed. No concerns appreciated.  Tramadol #4 on 10/10, #15 on 10/20.    History Patient Active Problem List   Diagnosis Date  Noted  . Type II diabetes mellitus ([place]) 01/16/2015  . Hypersomnolence 08/14/2012  . History of Helicobacter pylori infection 04/16/2012  . GERD (gastroesophageal reflux disease) 04/16/2012  . Elevated transaminase level 02/02/2012  . Obesity 12/29/2011  . 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 Syncope   . Ventricular tachycardia ([place])   . Anxiety    Past Medical History:  Diagnosis Date  . 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 Diabetes mellitus without complication ([place])   . Dual implantable cardiac defibrillator in situ    Medtronic [id number] Protecta  . Elevated transaminase level    10/2011  ? amio  . GERD (gastroesophageal reflux disease)   . HTN (hypertension)   . Other primary cardiomyopathies   . Syncope   . Tubular adenoma of colon   . Ventricular tachycardia ([place])    storm following ICD implant on amiodarone    Past Surgical History:  Procedure Laterality Date  . CARDIAC DEFIBRILLATOR PLACEMENT     Medtronic [id number] Protecta  . CATARACT EXTRACTION, BILATERAL  2019  . CHOLECYSTECTOMY    . COLONOSCOPY WITH PROPOFOL N/A 06/30/2017   Procedure: COLONOSCOPY WITH PROPOFOL;  Surgeon: Carol Ada, MD;  Location: [facility];  Service: Endoscopy;  Laterality: N/A;  . GALLBLADDER SURGERY    . HERNIA REPAIR    . ICD GENERATOR CHANGEOUT N/A 04/26/2017   Procedure: ICD Generator Changeout;  Surgeon: Caryl Comes,  Revonda Standard, MD;  Location: [facility];  Service: Cardiovascular;  Laterality: N/A;  . TONSILLECTOMY AND ADENOIDECTOMY     Allergies  Allergen Reactions  . Adhesive [Tape]     Swelling and welts formed   Prior to Admission medications   Medication Sig Start Date End Date Taking? Authorizing Provider  acetaminophen (TYLENOL) 500 MG tablet Take 1,000 mg by mouth daily as needed for moderate pain or headache.   Yes [provider]  amiodarone (PACERONE) 200 MG tablet TAKE 1/2 (ONE-HALF) TABLET BY MOUTH ONCE DAILY 01/28/19  Yes  [name], MD  aspirin EC 81 MG tablet Take 1 tablet (81 mg total) by mouth daily. 11/11/11  Yes [name], MD  atorvastatin (LIPITOR) 10 MG tablet Take 1 tablet (10 mg total) by mouth daily. 04/23/19  Yes Wendie Agreste, MD  Cholecalciferol (VITAMIN D) 2000 UNITS tablet Take 2,000 Units by mouth daily.   Yes [provider]  famotidine (PEPCID) 20 MG tablet Take 1 tablet (20 mg total) by mouth 2 (two) times daily as needed. 04/16/12  Yes Pyrtle, Lajuan Lines, MD  metFORMIN (GLUCOPHAGE) 850 MG tablet Take 1 tablet (850 mg total) by mouth 2 (two) times daily with a meal. 04/23/19  Yes Wendie Agreste, MD  metoprolol succinate (TOPROL-XL) 100 MG 24 hr tablet TAKE 1 TABLET BY MOUTH ONCE DAILY IMMEDIATELY  FOLLOWING  A  MEAL 09/19/18  Yes [name], MD  olmesartan-hydrochlorothiazide (BENICAR HCT) 20-12.5 MG tablet Take 1 tablet by mouth daily. 04/23/19  Yes Wendie Agreste, MD  Omega-3 Fatty Acids (FISH OIL) 1000 MG CAPS Take 1,000 mg by mouth 2 (two) times daily.    Yes [provider]  OVER THE COUNTER MEDICATION    Yes [provider]  tadalafil (CIALIS) 20 MG tablet Take 0.5-1 tablets (10-20 mg total) by mouth daily as needed for erectile dysfunction. 09/15/16  Yes Wendie Agreste, MD   Social History   Socioeconomic History  . Marital status: Married    Spouse name: Not on file  . Number of children: 0  . Years of education: Not on file  . Highest education level: Bachelor's degree (e.g., BA, AB, BS)  Occupational History  . Occupation: Retired Tour manager  . Financial resource strain: Not hard at all  . Food insecurity    Worry: Never true    Inability: Never true  . Transportation needs    Medical: No    Non-medical: No  Tobacco Use  . Smoking status: Never Smoker  . Smokeless tobacco: Never Used  Substance and Sexual Activity  . Alcohol use: Yes    Alcohol/week: 2.0 standard drinks    Types: 2 Cans of beer per week     Comment: 4/week  . Drug use: No  . Sexual activity: Yes  Lifestyle  . Physical activity    Days per week: 4 days    Minutes per session: 50 min  . Stress: Not at all  Relationships  . Social connections    Talks on phone: More than three times a week    Gets together: More than three times a week    Attends religious service: More than 4 times per year    Active member of club or organization: No    Attends meetings of clubs or organizations: Never    Relationship status: Married  . Intimate partner violence    Fear of current or ex partner: No  Emotionally abused: No    Physically abused: No    Forced sexual activity: No  Other Topics Concern  . Not on file  Social History Narrative   Divorced. Education: [employer]. Exercise: walking 3 times a week for 30 minutes.   Retired Pharmacist, hospital    Review of Systems Per HPI.   Objective:   Vitals:   10/02/19 1124  BP: 134/81  Pulse: (!) 104  Temp: 98.3 F (36.8 C)  TempSrc: Oral  SpO2: 97%  Weight: 179 lb 9.6 oz (81.5 kg)     Physical Exam Vitals signs reviewed.  Constitutional:      General: He is not in acute distress.    Appearance: He is well-developed.  HENT:     Head: Normocephalic and atraumatic.  Cardiovascular:     Rate and Rhythm: Normal rate.  Pulmonary:     Effort: Pulmonary effort is normal.  Musculoskeletal:     Right knee: He exhibits decreased range of motion (min dec flex, full ext. ), effusion (trace. ) and abnormal meniscus (medial pain with flex/ir. ). Tenderness found. Medial joint line (with bony prominence. ) tenderness noted.     Lumbar back: He exhibits pain (Pain from right low back, right hip towards anterior thigh and knee with seated straight leg raise.  Using cane for assistance but weightbearing without difficulty.).  Neurological:     Mental Status: He is alert and oriented to person, place, and time.         Assessment & Plan:  Dakota Gutierrez is a 68 y.o. male . Right knee  pain, unspecified chronicity - Plan: Ambulatory referral to Orthopedic Surgery, traMADol (ULTRAM) 50 MG tablet, DG Knee Complete 4 Views Right, Apply knee sleeve  -Likely osteoarthritis or degenerative meniscal disease with intermittent symptoms previously, recent flare due to back pain and change in gait possibly.  -He has requested second Medrol Dosepak from orthopedics, will hold on injections at this time.  Trial of OA web reaction brace for offloading, and referred to orthopedics.  Continue Tylenol or tramadol if needed for more severe pain.  Right-sided low back pain with right-sided sciatica, unspecified chronicity - Plan: traMADol (ULTRAM) 50 MG tablet  -Currently under treatment with orthopedics.  Will be requesting second Medrol Dosepak, continued physical therapy.  Denies signs or symptoms of cauda equina at present but ER precautions given.  Tramadol refilled temporarily.  No orders of the defined types were placed in this encounter.  Patient Instructions       If you have lab work done today you will be contacted with your lab results within the next 2 weeks.  If you have not heard from [name] then please contact us. The fastest way to get your results is to register for My Chart.   IF you received an x-ray today, you will receive an invoice from [facility]. Please contact [facility] at [phone] with questions or concerns regarding your invoice.   IF you received labwork today, you will receive an invoice from [facility]. Please contact [facility] at [phone] with questions or concerns regarding your invoice.   Our billing staff will not be able to assist you with questions regarding bills from these companies.  You will be contacted with the lab results as soon as they are available. The fastest way to get your results is to activate your My Chart account. Instructions are located on the last page of this paperwork. If you have not heard from [name] regarding the  results  in 2 weeks, please contact this office.          Signed, [name], MD [facility]

## 2019-10-03 ENCOUNTER — Ambulatory Visit
Admission: RE | Admit: 2019-10-03 | Discharge: 2019-10-03 | Disposition: A | Discharge status: Home / Self Care | Payer: Medicare Other | Source: Ambulatory Visit | Attending: Family Medicine | Admitting: Family Medicine

## 2019-10-03 DIAGNOSIS — M25561 Pain in right knee: Secondary | ICD-10-CM

## 2019-10-31 ENCOUNTER — Other Ambulatory Visit: Payer: Self-pay | Admitting: Family Medicine

## 2019-10-31 DIAGNOSIS — E785 Hyperlipidemia, unspecified: Secondary | ICD-10-CM

## 2019-10-31 DIAGNOSIS — I10 Essential (primary) hypertension: Secondary | ICD-10-CM

## 2019-11-05 ENCOUNTER — Other Ambulatory Visit: Payer: Self-pay | Admitting: Family Medicine

## 2019-11-05 DIAGNOSIS — E1165 Type 2 diabetes mellitus with hyperglycemia: Secondary | ICD-10-CM

## 2019-11-13 ENCOUNTER — Ambulatory Visit: Payer: Medicare Other | Admitting: Family Medicine

## 2019-11-14 ENCOUNTER — Ambulatory Visit (INDEPENDENT_AMBULATORY_CARE_PROVIDER_SITE_OTHER): Payer: Medicare Other | Admitting: Emergency Medicine

## 2019-11-14 ENCOUNTER — Encounter: Payer: Self-pay | Admitting: Emergency Medicine

## 2019-11-14 ENCOUNTER — Other Ambulatory Visit: Payer: Self-pay

## 2019-11-14 VITALS — BP 118/71 | HR 105 | Temp 98.4°F | Resp 16 | Ht 65.5 in | Wt 177.0 lb

## 2019-11-14 DIAGNOSIS — E1165 Type 2 diabetes mellitus with hyperglycemia: Secondary | ICD-10-CM

## 2019-11-14 DIAGNOSIS — I152 Hypertension secondary to endocrine disorders: Secondary | ICD-10-CM | POA: Insufficient documentation

## 2019-11-14 DIAGNOSIS — I10 Essential (primary) hypertension: Secondary | ICD-10-CM | POA: Diagnosis not present

## 2019-11-14 DIAGNOSIS — E1159 Type 2 diabetes mellitus with other circulatory complications: Secondary | ICD-10-CM

## 2019-11-14 LAB — POCT GLYCOSYLATED HEMOGLOBIN (HGB A1C): Hemoglobin A1C: 7 % — AB (ref 4.0–5.6)

## 2019-11-14 LAB — GLUCOSE, POCT (MANUAL RESULT ENTRY): POC Glucose: 124 mg/dl — AB (ref 70–99)

## 2019-11-14 MED ORDER — FARXIGA 5 MG PO TABS: 5.0000 mg | ORAL_TABLET | Freq: Every day | ORAL | 1 refills | Status: DC

## 2019-11-14 MED ORDER — METFORMIN HCL 500 MG PO TABS: 500.0000 mg | ORAL_TABLET | Freq: Two times a day (BID) | ORAL | 3 refills | Status: DC

## 2019-11-14 NOTE — Patient Instructions (Addendum)
Decrease Metformin to 500 mg twice a day. Start Farxiga 5 mg daily. Follow-up with Dr. Nyoka Cowden in 3 months.   If you have lab work done today you will be contacted with your lab results within the next 2 weeks.  If you have not heard from [name] then please contact us. The fastest way to get your results is to register for My Chart.   IF you received an x-ray today, you will receive an invoice from [facility]. Please contact [facility] at [phone] with questions or concerns regarding your invoice.   IF you received labwork today, you will receive an invoice from [facility]. Please contact [facility] at [phone] with questions or concerns regarding your invoice.   Our billing staff will not be able to assist you with questions regarding bills from these companies.  You will be contacted with the lab results as soon as they are available. The fastest way to get your results is to activate your My Chart account. Instructions are located on the last page of this paperwork. If you have not heard from [name] regarding the results in 2 weeks, please contact this office.     Diabetes Mellitus and Nutrition, Adult When you have diabetes (diabetes mellitus), it is very important to have healthy eating habits because your blood sugar (glucose) levels are greatly affected by what you eat and drink. Eating healthy foods in the appropriate amounts, at about the same times every day, can help you:  Control your blood glucose.  Lower your risk of heart disease.  Improve your blood pressure.  Reach or maintain a healthy weight. Every person with diabetes is different, and each person has different needs for a meal plan. Your health care provider may recommend that you work with a diet and nutrition specialist (dietitian) to make a meal plan that is best for you. Your meal plan may vary depending on factors such as:  The calories you need.  The medicines you take.  Your  weight.  Your blood glucose, blood pressure, and cholesterol levels.  Your activity level.  Other health conditions you have, such as heart or kidney disease. How do carbohydrates affect me? Carbohydrates, also called carbs, affect your blood glucose level more than any other type of food. Eating carbs naturally raises the amount of glucose in your blood. Carb counting is a method for keeping track of how many carbs you eat. Counting carbs is important to keep your blood glucose at a healthy level, especially if you use insulin or take certain oral diabetes medicines. It is important to know how many carbs you can safely have in each meal. This is different for every person. Your dietitian can help you calculate how many carbs you should have at each meal and for each snack. Foods that contain carbs include:  Bread, cereal, rice, pasta, and crackers.  Potatoes and corn.  Peas, beans, and lentils.  Milk and yogurt.  Fruit and juice.  Desserts, such as cakes, cookies, ice cream, and candy. How does alcohol affect me? Alcohol can cause a sudden decrease in blood glucose (hypoglycemia), especially if you use insulin or take certain oral diabetes medicines. Hypoglycemia can be a life-threatening condition. Symptoms of hypoglycemia (sleepiness, dizziness, and confusion) are similar to symptoms of having too much alcohol. If your health care provider says that alcohol is safe for you, follow these guidelines:  Limit alcohol intake to no more than 1 drink per day for nonpregnant women and 2 drinks per day  for men. One drink equals 12 oz of beer, 5 oz of wine, or 1 oz of hard liquor.  Do not drink on an empty stomach.  Keep yourself hydrated with water, diet soda, or unsweetened iced tea.  Keep in mind that regular soda, juice, and other mixers may contain a lot of sugar and must be counted as carbs. What are tips for following this plan?  Reading food labels  Start by checking the  serving size on the "Nutrition Facts" label of packaged foods and drinks. The amount of calories, carbs, fats, and other nutrients listed on the label is based on one serving of the item. Many items contain more than one serving per package.  Check the total grams (g) of carbs in one serving. You can calculate the number of servings of carbs in one serving by dividing the total carbs by 15. For example, if a food has 30 g of total carbs, it would be equal to 2 servings of carbs.  Check the number of grams (g) of saturated and trans fats in one serving. Choose foods that have low or no amount of these fats.  Check the number of milligrams (mg) of salt (sodium) in one serving. Most people should limit total sodium intake to less than 2,300 mg per day.  Always check the nutrition information of foods labeled as "low-fat" or "nonfat". These foods may be higher in added sugar or refined carbs and should be avoided.  Talk to your dietitian to identify your daily goals for nutrients listed on the label. Shopping  Avoid buying canned, premade, or processed foods. These foods tend to be high in fat, sodium, and added sugar.  Shop around the outside edge of the grocery store. This includes fresh fruits and vegetables, bulk grains, fresh meats, and fresh dairy. Cooking  Use low-heat cooking methods, such as baking, instead of high-heat cooking methods like deep frying.  Cook using healthy oils, such as olive, canola, or sunflower oil.  Avoid cooking with butter, cream, or high-fat meats. Meal planning  Eat meals and snacks regularly, preferably at the same times every day. Avoid going long periods of time without eating.  Eat foods high in fiber, such as fresh fruits, vegetables, beans, and whole grains. Talk to your dietitian about how many servings of carbs you can eat at each meal.  Eat 4-6 ounces (oz) of lean protein each day, such as lean meat, chicken, fish, eggs, or tofu. One oz of lean  protein is equal to: ? 1 oz of meat, chicken, or fish. ? 1 egg. ?  cup of tofu.  Eat some foods each day that contain healthy fats, such as avocado, nuts, seeds, and fish. Lifestyle  Check your blood glucose regularly.  Exercise regularly as told by your health care provider. This may include: ? 150 minutes of moderate-intensity or vigorous-intensity exercise each week. This could be brisk walking, biking, or water aerobics. ? Stretching and doing strength exercises, such as yoga or weightlifting, at least 2 times a week.  Take medicines as told by your health care provider.  Do not use any products that contain nicotine or tobacco, such as cigarettes and e-cigarettes. If you need help quitting, ask your health care provider.  Work with a Social worker or diabetes educator to identify strategies to manage stress and any emotional and social challenges. Questions to ask a health care provider  Do I need to meet with a diabetes educator?  Do I need to meet  with a dietitian?  What number can I call if I have questions?  When are the best times to check my blood glucose? Where to find more information:  American Diabetes Association: diabetes.org  Academy of Nutrition and Dietetics: www.eatright.[employer] of Diabetes and Digestive and Kidney Diseases (NIH): [employer] Summary  A healthy meal plan will help you control your blood glucose and maintain a healthy lifestyle.  Working with a diet and nutrition specialist (dietitian) can help you make a meal plan that is best for you.  Keep in mind that carbohydrates (carbs) and alcohol have immediate effects on your blood glucose levels. It is important to count carbs and to use alcohol carefully. This information is not intended to replace advice given to you by your health care provider. Make sure you discuss any questions you have with your health care provider. Document Released: 08/11/2005 Document Revised:  10/27/2017 Document Reviewed: 12/19/2016 Elsevier Patient Education  2020 [employer].

## 2019-11-14 NOTE — Progress Notes (Signed)
Lab Results  Component Value Date   HGBA1C 7.8 (H) 08/14/2019   Dakota Gutierrez 68 y.o.   Chief Complaint  Patient presents with  . Diabetes    3 month follow up     HISTORY OF PRESENT ILLNESS: This is a 68 y.o. male with history of diabetes here for his 66-month recheck and hemoglobin A1c.  Patient of Dr. Carlota Raspberry. First visit with me. Last visit with Dr. Carlota Raspberry last September plan was as follows: Patient Instructions   Depending on A1c, and tolerability of metformin, could try lower dose of metformin and adding different med such as [place] or [place], or [place].   recheck in 3 months. Keep up the good work with the diet.    HPI   Prior to Admission medications   Medication Sig Start Date End Date Taking? Authorizing Provider  acetaminophen (TYLENOL) 500 MG tablet Take 1,000 mg by mouth daily as needed for moderate pain or headache.   Yes [provider]  amiodarone (PACERONE) 200 MG tablet TAKE 1/2 (ONE-HALF) TABLET BY MOUTH ONCE DAILY 01/28/19  Yes [name], MD  aspirin EC 81 MG tablet Take 1 tablet (81 mg total) by mouth daily. 11/11/11  Yes [name], MD  atorvastatin (LIPITOR) 10 MG tablet TAKE 1 TABLET BY MOUTH EVERY DAY 10/31/19  Yes Wendie Agreste, MD  Cholecalciferol (VITAMIN D) 2000 UNITS tablet Take 2,000 Units by mouth daily.   Yes [provider]  famotidine (PEPCID) 20 MG tablet Take 1 tablet (20 mg total) by mouth 2 (two) times daily as needed. 04/16/12  Yes Pyrtle, Lajuan Lines, MD  metFORMIN (GLUCOPHAGE) 850 MG tablet TAKE 1 TABLET (850 MG TOTAL) BY MOUTH 2 (TWO) TIMES DAILY WITH A MEAL. 11/05/19  Yes Wendie Agreste, MD  metoprolol succinate (TOPROL-XL) 100 MG 24 hr tablet TAKE 1 TABLET BY MOUTH ONCE DAILY(IMMEDIATLY FOLLOWING A MEAL) 10/02/19  Yes [name], MD  olmesartan-hydrochlorothiazide (BENICAR HCT) 20-12.5 MG tablet TAKE 1 TABLET BY MOUTH EVERY DAY 10/31/19  Yes Wendie Agreste, MD  Omega-3 Fatty Acids (FISH OIL)  1000 MG CAPS Take 1,000 mg by mouth 2 (two) times daily.    Yes [provider]  tadalafil (CIALIS) 20 MG tablet Take 0.5-1 tablets (10-20 mg total) by mouth daily as needed for erectile dysfunction. 09/15/16  Yes Wendie Agreste, MD  OVER THE COUNTER MEDICATION     [provider]    Allergies  Allergen Reactions  . Adhesive [Tape]     Swelling and welts formed    Patient Active Problem List   Diagnosis Date Noted  . Type II diabetes mellitus ([place]) 01/16/2015  . Hypersomnolence 08/14/2012  . History of Helicobacter pylori infection 04/16/2012  . GERD (gastroesophageal reflux disease) 04/16/2012  . Elevated transaminase level 02/02/2012  . Obesity 12/29/2011  . 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 Syncope   . Ventricular tachycardia ([place])   . Anxiety     Past Medical History:  Diagnosis Date  . 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 Diabetes mellitus without complication ([place])   . Dual implantable cardiac defibrillator in situ    Medtronic [id number] Protecta  . Elevated transaminase level    10/2011  ? amio  . GERD (gastroesophageal reflux disease)   . HTN (hypertension)   . Other primary cardiomyopathies   . Syncope   . Tubular adenoma of colon   . Ventricular tachycardia ([place])  storm following ICD implant on amiodarone     Past Surgical History:  Procedure Laterality Date  . CARDIAC DEFIBRILLATOR PLACEMENT     Medtronic [id number] Protecta  . CATARACT EXTRACTION, BILATERAL  2019  . CHOLECYSTECTOMY    . COLONOSCOPY WITH PROPOFOL N/A 06/30/2017   Procedure: COLONOSCOPY WITH PROPOFOL;  Surgeon: Carol Ada, MD;  Location: [facility];  Service: Endoscopy;  Laterality: N/A;  . GALLBLADDER SURGERY    . HERNIA REPAIR    . ICD GENERATOR CHANGEOUT N/A 04/26/2017   Procedure: ICD Generator Changeout;  Surgeon: [name], MD;  Location: [facility] [facility];  Service: Cardiovascular;  Laterality: N/A;  . TONSILLECTOMY  AND ADENOIDECTOMY      Social History   Socioeconomic History  . Marital status: Married    Spouse name: Not on file  . Number of children: 0  . Years of education: Not on file  . Highest education level: Bachelor's degree (e.g., BA, AB, BS)  Occupational History  . Occupation: Retired Pharmacist, hospital  Tobacco Use  . Smoking status: Never Smoker  . Smokeless tobacco: Never Used  Substance and Sexual Activity  . Alcohol use: Yes    Alcohol/week: 2.0 standard drinks    Types: 2 Cans of beer per week    Comment: 4/week  . Drug use: No  . Sexual activity: Yes  Other Topics Concern  . Not on file  Social History Narrative   Divorced. Education: [employer]. Exercise: walking 3 times a week for 30 minutes.   Retired Pharmacist, hospital   Social Determinants of Radio broadcast assistant Strain:   . Difficulty of Paying Living Expenses: Not on file  Food Insecurity:   . Worried About Charity fundraiser in the Last Year: Not on file  . Ran Out of Food in the Last Year: Not on file  Transportation Needs:   . Lack of Transportation (Medical): Not on file  . Lack of Transportation (Non-Medical): Not on file  Physical Activity:   . Days of Exercise per Week: Not on file  . Minutes of Exercise per Session: Not on file  Stress:   . Feeling of Stress : Not on file  Social Connections:   . Frequency of Communication with Friends and Family: Not on file  . Frequency of Social Gatherings with Friends and Family: Not on file  . Attends Religious Services: Not on file  . Active Member of Clubs or Organizations: Not on file  . Attends Archivist Meetings: Not on file  . Marital Status: Not on file  Intimate Partner Violence:   . Fear of Current or Ex-Partner: Not on file  . Emotionally Abused: Not on file  . Physically Abused: Not on file  . Sexually Abused: Not on file    Family History  Problem Relation Age of Onset  . Hyperlipidemia Mother   . Heart disease Father   . Colon cancer Neg  Hx   . Esophageal cancer Neg Hx   . Stomach cancer Neg Hx   . Rectal cancer Neg Hx      Review of Systems  Constitutional: Negative.  Negative for chills and fever.  HENT: Negative for congestion and sore throat.   Respiratory: Negative.  Negative for cough and shortness of breath.   Cardiovascular: Negative.  Negative for palpitations.  Gastrointestinal: Negative for abdominal pain, diarrhea, nausea and vomiting.  Genitourinary: Negative.  Negative for dysuria.  Musculoskeletal: Negative.  Negative for myalgias.  Skin: Negative.  Negative  for rash.  Neurological: Negative for dizziness and headaches.  All other systems reviewed and are negative.    Vitals:   11/14/19 1500  BP: 118/71  Pulse: (!) 105  Resp: 16  Temp: 98.4 F (36.9 C)  SpO2: 97%     Physical Exam Vitals reviewed.  Constitutional:      Appearance: Normal appearance.  HENT:     Head: Normocephalic.  Eyes:     Extraocular Movements: Extraocular movements intact.     Pupils: Pupils are equal, round, and reactive to light.  Neck:     Vascular: No carotid bruit.  Cardiovascular:     Rate and Rhythm: Normal rate and regular rhythm.     Pulses: Normal pulses.     Heart sounds: Normal heart sounds.     Comments: Pacemaker in place Pulmonary:     Effort: Pulmonary effort is normal.     Breath sounds: Normal breath sounds.  Abdominal:     Palpations: Abdomen is soft.     Tenderness: There is no abdominal tenderness.  Musculoskeletal:        General: Normal range of motion.     Cervical back: Normal range of motion.  Skin:    General: Skin is warm and dry.  Neurological:     General: No focal deficit present.     Mental Status: He is alert and oriented to person, place, and time.  Psychiatric:        Mood and Affect: Mood normal.        Behavior: Behavior normal.    Results for orders placed or performed in visit on 11/14/19 (from the past 24 hour(s))  POCT glucose (manual entry)     Status:  Abnormal   Collection Time: 11/14/19  3:19 PM  Result Value Ref Range   POC Glucose 124 (A) 70 - 99 mg/dl  POCT glycosylated hemoglobin (Hb A1C)     Status: Abnormal   Collection Time: 11/14/19  3:22 PM  Result Value Ref Range   Hemoglobin A1C 7.0 (A) 4.0 - 5.6 %   HbA1c POC (<> result, manual entry)     HbA1c, POC (prediabetic range)     HbA1c, POC (controlled diabetic range)      ASSESSMENT & PLAN: Hypertension associated with type 2 diabetes mellitus ([place]) Well-controlled blood pressure.  Continue present medication. Improved diabetes with hemoglobin A1c at 7.0, better than before, 7.8. Still having some issues with Metformin side effects. We will decrease Metformin to 500 mg twice a day and start Farxiga 5 mg daily.  Patient's renal function is within normal limits. Continue diet and diabetic nutrition.  Continue statin Lipitor 10 mg daily. Follow-up in 3 months.  [place] was seen today for diabetes.  Diagnoses and all orders for this visit:  Hypertension associated with type 2 diabetes mellitus ([place])  Type 2 diabetes mellitus with hyperglycemia, without long-term current use of insulin ([facility]) -     POCT glucose (manual entry) -     POCT glycosylated hemoglobin (Hb A1C) -     Comprehensive metabolic panel -     Lipid panel -     metFORMIN (GLUCOPHAGE) 500 MG tablet; Take 1 tablet (500 mg total) by mouth 2 (two) times daily with a meal. -     dapagliflozin propanediol (FARXIGA) 5 MG TABS tablet; Take 5 mg by mouth daily before breakfast.    Patient Instructions    Decrease Metformin to 500 mg twice a day. Start Farxiga 5 mg daily.  Follow-up with Dr. Nyoka Cowden in 3 months.   If you have lab work done today you will be contacted with your lab results within the next 2 weeks.  If you have not heard from [name] then please contact us. The fastest way to get your results is to register for My Chart.   IF you received an x-ray today, you will receive an invoice from [facility]  Radiology. Please contact [facility] at [phone] with questions or concerns regarding your invoice.   IF you received labwork today, you will receive an invoice from [facility]. Please contact [facility] at [phone] with questions or concerns regarding your invoice.   Our billing staff will not be able to assist you with questions regarding bills from these companies.  You will be contacted with the lab results as soon as they are available. The fastest way to get your results is to activate your My Chart account. Instructions are located on the last page of this paperwork. If you have not heard from [name] regarding the results in 2 weeks, please contact this office.     Diabetes Mellitus and Nutrition, Adult When you have diabetes (diabetes mellitus), it is very important to have healthy eating habits because your blood sugar (glucose) levels are greatly affected by what you eat and drink. Eating healthy foods in the appropriate amounts, at about the same times every day, can help you:  Control your blood glucose.  Lower your risk of heart disease.  Improve your blood pressure.  Reach or maintain a healthy weight. Every person with diabetes is different, and each person has different needs for a meal plan. Your health care provider may recommend that you work with a diet and nutrition specialist (dietitian) to make a meal plan that is best for you. Your meal plan may vary depending on factors such as:  The calories you need.  The medicines you take.  Your weight.  Your blood glucose, blood pressure, and cholesterol levels.  Your activity level.  Other health conditions you have, such as heart or kidney disease. How do carbohydrates affect me? Carbohydrates, also called carbs, affect your blood glucose level more than any other type of food. Eating carbs naturally raises the amount of glucose in your blood. Carb counting is a method for keeping track of how many carbs you  eat. Counting carbs is important to keep your blood glucose at a healthy level, especially if you use insulin or take certain oral diabetes medicines. It is important to know how many carbs you can safely have in each meal. This is different for every person. Your dietitian can help you calculate how many carbs you should have at each meal and for each snack. Foods that contain carbs include:  Bread, cereal, rice, pasta, and crackers.  Potatoes and corn.  Peas, beans, and lentils.  Milk and yogurt.  Fruit and juice.  Desserts, such as cakes, cookies, ice cream, and candy. How does alcohol affect me? Alcohol can cause a sudden decrease in blood glucose (hypoglycemia), especially if you use insulin or take certain oral diabetes medicines. Hypoglycemia can be a life-threatening condition. Symptoms of hypoglycemia (sleepiness, dizziness, and confusion) are similar to symptoms of having too much alcohol. If your health care provider says that alcohol is safe for you, follow these guidelines:  Limit alcohol intake to no more than 1 drink per day for nonpregnant women and 2 drinks per day for men. One drink equals 12 oz of beer, 5 oz of  wine, or 1 oz of hard liquor.  Do not drink on an empty stomach.  Keep yourself hydrated with water, diet soda, or unsweetened iced tea.  Keep in mind that regular soda, juice, and other mixers may contain a lot of sugar and must be counted as carbs. What are tips for following this plan?  Reading food labels  Start by checking the serving size on the "Nutrition Facts" label of packaged foods and drinks. The amount of calories, carbs, fats, and other nutrients listed on the label is based on one serving of the item. Many items contain more than one serving per package.  Check the total grams (g) of carbs in one serving. You can calculate the number of servings of carbs in one serving by dividing the total carbs by 15. For example, if a food has 30 g of total  carbs, it would be equal to 2 servings of carbs.  Check the number of grams (g) of saturated and trans fats in one serving. Choose foods that have low or no amount of these fats.  Check the number of milligrams (mg) of salt (sodium) in one serving. Most people should limit total sodium intake to less than 2,300 mg per day.  Always check the nutrition information of foods labeled as "low-fat" or "nonfat". These foods may be higher in added sugar or refined carbs and should be avoided.  Talk to your dietitian to identify your daily goals for nutrients listed on the label. Shopping  Avoid buying canned, premade, or processed foods. These foods tend to be high in fat, sodium, and added sugar.  Shop around the outside edge of the grocery store. This includes fresh fruits and vegetables, bulk grains, fresh meats, and fresh dairy. Cooking  Use low-heat cooking methods, such as baking, instead of high-heat cooking methods like deep frying.  Cook using healthy oils, such as olive, canola, or sunflower oil.  Avoid cooking with butter, cream, or high-fat meats. Meal planning  Eat meals and snacks regularly, preferably at the same times every day. Avoid going long periods of time without eating.  Eat foods high in fiber, such as fresh fruits, vegetables, beans, and whole grains. Talk to your dietitian about how many servings of carbs you can eat at each meal.  Eat 4-6 ounces (oz) of lean protein each day, such as lean meat, chicken, fish, eggs, or tofu. One oz of lean protein is equal to: ? 1 oz of meat, chicken, or fish. ? 1 egg. ?  cup of tofu.  Eat some foods each day that contain healthy fats, such as avocado, nuts, seeds, and fish. Lifestyle  Check your blood glucose regularly.  Exercise regularly as told by your health care provider. This may include: ? 150 minutes of moderate-intensity or vigorous-intensity exercise each week. This could be brisk walking, biking, or water  aerobics. ? Stretching and doing strength exercises, such as yoga or weightlifting, at least 2 times a week.  Take medicines as told by your health care provider.  Do not use any products that contain nicotine or tobacco, such as cigarettes and e-cigarettes. If you need help quitting, ask your health care provider.  Work with a Social worker or diabetes educator to identify strategies to manage stress and any emotional and social challenges. Questions to ask a health care provider  Do I need to meet with a diabetes educator?  Do I need to meet with a dietitian?  What number can I call if I have  questions?  When are the best times to check my blood glucose? Where to find more information:  American Diabetes Association: diabetes.org  Academy of Nutrition and Dietetics: www.eatright.[employer] of Diabetes and Digestive and Kidney Diseases (NIH): [employer] Summary  A healthy meal plan will help you control your blood glucose and maintain a healthy lifestyle.  Working with a diet and nutrition specialist (dietitian) can help you make a meal plan that is best for you.  Keep in mind that carbohydrates (carbs) and alcohol have immediate effects on your blood glucose levels. It is important to count carbs and to use alcohol carefully. This information is not intended to replace advice given to you by your health care provider. Make sure you discuss any questions you have with your health care provider. Document Released: 08/11/2005 Document Revised: 10/27/2017 Document Reviewed: 12/19/2016 Elsevier Patient Education  2020 Elsevier Inc.      Agustina Caroli, MD [facility]

## 2019-11-14 NOTE — Assessment & Plan Note (Signed)
Well-controlled blood pressure.  Continue present medication. Improved diabetes with hemoglobin A1c at 7.0, better than before, 7.8. Still having some issues with Metformin side effects. We will decrease Metformin to 500 mg twice a day and start Farxiga 5 mg daily.  Patient's renal function is within normal limits. Continue diet and diabetic nutrition.  Continue statin Lipitor 10 mg daily. Follow-up in 3 months.

## 2019-11-15 ENCOUNTER — Telehealth: Payer: Self-pay | Admitting: Family Medicine

## 2019-11-15 LAB — COMPREHENSIVE METABOLIC PANEL
ALT: 21 IU/L (ref 0–44)
AST: 24 IU/L (ref 0–40)
Albumin/Globulin Ratio: 2.5 — ABNORMAL HIGH (ref 1.2–2.2)
Albumin: 4.7 g/dL (ref 3.8–4.8)
Alkaline Phosphatase: 49 IU/L (ref 39–117)
BUN/Creatinine Ratio: 18 (ref 10–24)
BUN: 17 mg/dL (ref 8–27)
Bilirubin Total: 0.6 mg/dL (ref 0.0–1.2)
CO2: 23 mmol/L (ref 20–29)
Calcium: 9.7 mg/dL (ref 8.6–10.2)
Chloride: 100 mmol/L (ref 96–106)
Creatinine, Ser: 0.97 mg/dL (ref 0.76–1.27)
GFR calc Af Amer: 91 mL/min/{1.73_m2} (ref >59)
GFR calc non Af Amer: 79 mL/min/{1.73_m2} (ref >59)
Globulin, Total: 1.9 g/dL (ref 1.5–4.5)
Glucose: 122 mg/dL — ABNORMAL HIGH (ref 65–99)
Potassium: 4.4 mmol/L (ref 3.5–5.2)
Sodium: 140 mmol/L (ref 134–144)
Total Protein: 6.6 g/dL (ref 6.0–8.5)

## 2019-11-15 LAB — LIPID PANEL
Chol/HDL Ratio: 3.5 ratio (ref 0.0–5.0)
Cholesterol, Total: 119 mg/dL (ref 100–199)
HDL: 34 mg/dL — ABNORMAL LOW (ref >39)
LDL Chol Calc (NIH): 53 mg/dL (ref 0–99)
Triglycerides: 197 mg/dL — ABNORMAL HIGH (ref 0–149)
VLDL Cholesterol Cal: 32 mg/dL (ref 5–40)

## 2019-11-15 NOTE — Telephone Encounter (Signed)
PA Request processed. Key [id number]

## 2019-11-26 ENCOUNTER — Ambulatory Visit: Payer: Medicare Other | Admitting: Internal Medicine

## 2019-11-26 ENCOUNTER — Encounter: Payer: Self-pay | Admitting: Internal Medicine

## 2019-11-26 ENCOUNTER — Other Ambulatory Visit: Payer: Self-pay

## 2019-11-26 VITALS — BP 140/78 | HR 95 | Ht 65.5 in | Wt 184.0 lb

## 2019-11-26 DIAGNOSIS — E032 Hypothyroidism due to medicaments and other exogenous substances: Secondary | ICD-10-CM

## 2019-11-26 DIAGNOSIS — R0602 Shortness of breath: Secondary | ICD-10-CM | POA: Diagnosis not present

## 2019-11-26 DIAGNOSIS — I428 Other cardiomyopathies: Secondary | ICD-10-CM | POA: Diagnosis not present

## 2019-11-26 DIAGNOSIS — I472 Ventricular tachycardia, unspecified: Secondary | ICD-10-CM

## 2019-11-26 DIAGNOSIS — Z95810 Presence of automatic (implantable) cardiac defibrillator: Secondary | ICD-10-CM

## 2019-11-26 LAB — TSH: TSH: 3.28 u[IU]/mL (ref 0.450–4.500)

## 2019-11-26 MED ORDER — FUROSEMIDE 20 MG PO TABS: 20.0000 mg | ORAL_TABLET | Freq: Every day | ORAL | 0 refills | Status: DC | PRN

## 2019-11-26 NOTE — Patient Instructions (Signed)
Medication Instructions: Dr Caryl Comes wants you to start taking Furosemide 20mg  by mouth every other day x 5 doses.  If your symptoms don't subside increase your furosemide to 40mg  every other day x 5 doses.  Then take as necessary after that.   Labwork: Your physician recommends that you have a TSH today    Testing/Procedures: Your physician has requested that you have an echocardiogram. Echocardiography is a painless test that uses sound waves to create images of your heart. It provides your doctor with information about the size and shape of your heart and how well your heart's chambers and valves are working. This procedure takes approximately one hour. There are no restrictions for this procedure.    Follow-Up: Your physician wants you to follow-up in: 6 months You will receive a reminder letter in the mail two months in advance. If you don't receive a letter, please call our office to schedule the follow-up appointment.  Remote monitoring is used to monitor your  ICD from home. This monitoring reduces the number of office visits required to check your device to one time per year. It allows [name] to keep an eye on the functioning of your device to ensure it is working properly.  Any Other Special Instructions Will Be Listed Below (If Applicable).  If you need a refill on your cardiac medications before your next appointment, please call your pharmacy.

## 2019-11-26 NOTE — Progress Notes (Signed)
Thank you very much yet      Patient Care Team: Wendie Agreste, MD as PCP - General (Family Medicine) [name], MD (Cardiology) [name], MD as Attending Physician (Neurology)   HPI  Dakota Gutierrez is a 68 y.o. 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 Unfortunately, in the days that followed he developed VT storm with multiple shocks and was treated with amiodarone which has been gradually down titrated to 100 mg a day.  Amiodarone surveillance had demonstrated elevated liver function tests spring 2013 . We elected to discontinue the amiodarone albeit with some reluctance on the part of the family. When seen in June of recurrent episodes of ventricular tachycardia 2 of which required therapy. He was then resumed on amiodarone and referred for consultation with GI  most recently 4/17 transaminases were normal.     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  He has noted some peripheral edema.  Some worsening dyspnea.  It also hurt his back and was bedbound for a few weeks.  Tolerating amiodarone with normal surveillance laboratories.  No cough or nausea     Date Cr K TSH  ALT   5/18   3.84 25    3/19   3.73 27  8/19   3.4 24  3/20   3.25   12/20 0.97 4.4  21   DATE TEST EF   2012 [facility] 56% No Ischemia  5/17 Echo   45-50 %            Past Medical History:  Diagnosis Date  . 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 Diabetes mellitus without complication ([place])   . Dual implantable cardiac defibrillator in situ    Medtronic D314DRG Protecta  . Elevated transaminase level    10/2011  ? amio  . GERD (gastroesophageal reflux disease)   . HTN (hypertension)   . Other primary cardiomyopathies   . Syncope   .  Tubular adenoma of colon   . Ventricular tachycardia ([place])    storm following ICD implant on amiodarone     Past Surgical History:  Procedure Laterality Date  . CARDIAC DEFIBRILLATOR PLACEMENT     Medtronic D314DRG Protecta  . CATARACT EXTRACTION, BILATERAL  2019  . CHOLECYSTECTOMY    . COLONOSCOPY WITH PROPOFOL N/A 06/30/2017   Procedure: COLONOSCOPY WITH PROPOFOL;  Surgeon: Carol Ada, MD;  Location: [facility];  Service: Endoscopy;  Laterality: N/A;  . GALLBLADDER SURGERY    . HERNIA REPAIR    . ICD GENERATOR CHANGEOUT N/A 04/26/2017   Procedure: ICD Generator Changeout;  Surgeon: [name], MD;  Location: [facility];  Service: Cardiovascular;  Laterality: N/A;  . TONSILLECTOMY AND ADENOIDECTOMY      Current Outpatient Medications  Medication Sig Dispense Refill  . acetaminophen (TYLENOL) 500 MG tablet Take 1,000 mg by mouth daily as needed for moderate pain or headache.    Marland Kitchen amiodarone (PACERONE) 200 MG tablet TAKE 1/2 (ONE-HALF) TABLET BY MOUTH ONCE DAILY 45 tablet 3  . aspirin EC 81 MG tablet Take 1 tablet (81 mg total) by mouth daily.    Marland Kitchen atorvastatin (LIPITOR) 10 MG tablet TAKE 1 TABLET BY MOUTH EVERY DAY 90 tablet 0  . Cholecalciferol (VITAMIN  D) 2000 UNITS tablet Take 2,000 Units by mouth daily.    . famotidine (PEPCID) 20 MG tablet Take 1 tablet (20 mg total) by mouth 2 (two) times daily as needed. 60 tablet 4  . metFORMIN (GLUCOPHAGE) 500 MG tablet Take 1 tablet (500 mg total) by mouth 2 (two) times daily with a meal. 180 tablet 3  . metoprolol succinate (TOPROL-XL) 100 MG 24 hr tablet TAKE 1 TABLET BY MOUTH ONCE DAILY(IMMEDIATLY FOLLOWING A MEAL) 90 tablet 3  . olmesartan-hydrochlorothiazide (BENICAR HCT) 20-12.5 MG tablet TAKE 1 TABLET BY MOUTH EVERY DAY 90 tablet 0  . Omega-3 Fatty Acids (FISH OIL) 1000 MG CAPS Take 1,000 mg by mouth 2 (two) times daily.     Marland Kitchen OVER THE COUNTER MEDICATION     . tadalafil (CIALIS) 20 MG tablet Take 0.5-1 tablets (10-20  mg total) by mouth daily as needed for erectile dysfunction. 10 tablet 11   No current facility-administered medications for this visit.    Allergies  Allergen Reactions  . Adhesive [Tape]     Swelling and welts formed    Review of Systems negative except from HPI and PMH except for complaints of back pain and muscle pain  Physical Exam BP 140/78   Pulse 95   Ht 5' 5.5" (1.664 m)   Wt 184 lb (83.5 kg)   SpO2 99%   BMI 30.15 kg/m  Well developed and well nourished in no acute distress HENT normal Neck supple with JVP-7-8 Clear Device pocket well healed; without hematoma or erythema.  There is no tethering  Regular rate and rhythm, no   murmur Abd-soft with active BS No Clubbing cyanosis 2+ edema Skin-warm and dry A & Oriented  Grossly normal sensory and motor function  ECG sinus at 95 Intervals 26/11/35  Assessment and  Plan  VT Recurrent treated Ventricular tachycardia --ATPX2    ICD Medtronic  The patient's device was interrogated and the information was fully reviewed.  The device was reprogrammed to decrease the slope of his ADL rate  Sinus noded dysfunction   Amiodarone rx   HFpEF acute/chronic   Blood pressure is mildly elevated.  He is volume overloaded.  This is presumed to be with preserved ejection fraction; however it has been a number of years.  We will recheck his echo.  Begin him on furosemide 20 mg daily x5 doses; if he does not respond we will increase it to 40 mg.  We will then use it as needed.  Tolerating amiodarone.  We have discussed the physiology of heart failure including the importance of salt restriction and fluid restriction and have reviewed sources of dietary salt and water.  We spent more than 50% of our >25 min visit in face to face counseling regarding the above

## 2019-12-02 DIAGNOSIS — M1711 Unilateral primary osteoarthritis, right knee: Secondary | ICD-10-CM | POA: Diagnosis not present

## 2019-12-02 DIAGNOSIS — M545 Low back pain: Secondary | ICD-10-CM | POA: Diagnosis not present

## 2019-12-03 ENCOUNTER — Other Ambulatory Visit: Payer: Self-pay | Admitting: Orthopedic Surgery

## 2019-12-03 DIAGNOSIS — M545 Low back pain, unspecified: Secondary | ICD-10-CM

## 2019-12-05 ENCOUNTER — Ambulatory Visit
Admission: RE | Admit: 2019-12-05 | Discharge: 2019-12-05 | Disposition: A | Discharge status: Home / Self Care | Payer: Medicare Other | Source: Ambulatory Visit | Attending: Orthopedic Surgery | Admitting: Orthopedic Surgery

## 2019-12-05 DIAGNOSIS — M545 Low back pain, unspecified: Secondary | ICD-10-CM

## 2019-12-06 ENCOUNTER — Ambulatory Visit: Payer: Medicare Other | Admitting: Family Medicine

## 2019-12-09 ENCOUNTER — Ambulatory Visit: Payer: Medicare Other | Admitting: Family Medicine

## 2019-12-10 ENCOUNTER — Ambulatory Visit (INDEPENDENT_AMBULATORY_CARE_PROVIDER_SITE_OTHER): Payer: Medicare PPO | Admitting: *Deleted

## 2019-12-10 DIAGNOSIS — I428 Other cardiomyopathies: Secondary | ICD-10-CM

## 2019-12-10 LAB — CUP PACEART REMOTE DEVICE CHECK
Battery Remaining Longevity: 72 mo
Battery Voltage: 2.98 V
Brady Statistic AP VP Percent: 3.55 %
Brady Statistic AP VS Percent: 71.81 %
Brady Statistic AS VP Percent: 0.03 %
Brady Statistic AS VS Percent: 24.61 %
Brady Statistic RA Percent Paced: 74.62 %
Brady Statistic RV Percent Paced: 3.38 %
Date Time Interrogation Session: 20210112033328
HighPow Impedance: 38 Ohm
HighPow Impedance: 44 Ohm
Implantable Lead Implant Date: 20120531
Implantable Lead Implant Date: 20120531
Implantable Lead Location: 753859
Implantable Lead Location: 753860
Implantable Lead Model: 4076
Implantable Lead Model: 6947
Implantable Pulse Generator Implant Date: 20180530
Lead Channel Impedance Value: 304 Ohm
Lead Channel Impedance Value: 399 Ohm
Lead Channel Impedance Value: 399 Ohm
Lead Channel Pacing Threshold Amplitude: 0.5 V
Lead Channel Pacing Threshold Amplitude: 0.625 V
Lead Channel Pacing Threshold Pulse Width: 0.4 ms
Lead Channel Pacing Threshold Pulse Width: 0.4 ms
Lead Channel Sensing Intrinsic Amplitude: 11.625 mV
Lead Channel Sensing Intrinsic Amplitude: 11.625 mV
Lead Channel Sensing Intrinsic Amplitude: 2.125 mV
Lead Channel Sensing Intrinsic Amplitude: 2.125 mV
Lead Channel Setting Pacing Amplitude: 1.5 V
Lead Channel Setting Pacing Amplitude: 2 V
Lead Channel Setting Pacing Pulse Width: 0.4 ms
Lead Channel Setting Sensing Sensitivity: 0.3 mV

## 2019-12-11 ENCOUNTER — Other Ambulatory Visit: Payer: Self-pay

## 2019-12-11 ENCOUNTER — Ambulatory Visit (HOSPITAL_COMMUNITY): Payer: Medicare PPO | Attending: Cardiology

## 2019-12-11 DIAGNOSIS — R0602 Shortness of breath: Secondary | ICD-10-CM | POA: Diagnosis not present

## 2019-12-12 DIAGNOSIS — M1711 Unilateral primary osteoarthritis, right knee: Secondary | ICD-10-CM | POA: Diagnosis not present

## 2019-12-12 DIAGNOSIS — M545 Low back pain: Secondary | ICD-10-CM | POA: Diagnosis not present

## 2019-12-24 DIAGNOSIS — M5416 Radiculopathy, lumbar region: Secondary | ICD-10-CM | POA: Diagnosis not present

## 2020-01-23 DIAGNOSIS — M1711 Unilateral primary osteoarthritis, right knee: Secondary | ICD-10-CM | POA: Diagnosis not present

## 2020-01-27 ENCOUNTER — Other Ambulatory Visit: Payer: Self-pay | Admitting: Family Medicine

## 2020-01-27 DIAGNOSIS — E785 Hyperlipidemia, unspecified: Secondary | ICD-10-CM

## 2020-01-27 DIAGNOSIS — I10 Essential (primary) hypertension: Secondary | ICD-10-CM

## 2020-02-13 ENCOUNTER — Ambulatory Visit: Payer: Medicare Other | Admitting: Family Medicine

## 2020-03-11 ENCOUNTER — Ambulatory Visit (INDEPENDENT_AMBULATORY_CARE_PROVIDER_SITE_OTHER): Payer: Medicare PPO | Admitting: *Deleted

## 2020-03-11 DIAGNOSIS — Z95810 Presence of automatic (implantable) cardiac defibrillator: Secondary | ICD-10-CM | POA: Diagnosis not present

## 2020-03-11 LAB — CUP PACEART REMOTE DEVICE CHECK
Battery Remaining Longevity: 64 mo
Battery Voltage: 2.98 V
Brady Statistic AP VP Percent: 9.92 %
Brady Statistic AP VS Percent: 72.26 %
Brady Statistic AS VP Percent: 0.03 %
Brady Statistic AS VS Percent: 17.79 %
Brady Statistic RA Percent Paced: 80.57 %
Brady Statistic RV Percent Paced: 9.59 %
Date Time Interrogation Session: 20210414042403
HighPow Impedance: 37 Ohm
HighPow Impedance: 43 Ohm
Implantable Lead Implant Date: 20120531
Implantable Lead Implant Date: 20120531
Implantable Lead Location: 753859
Implantable Lead Location: 753860
Implantable Lead Model: 4076
Implantable Lead Model: 6947
Implantable Pulse Generator Implant Date: 20180530
Lead Channel Impedance Value: 304 Ohm
Lead Channel Impedance Value: 361 Ohm
Lead Channel Impedance Value: 399 Ohm
Lead Channel Pacing Threshold Amplitude: 0.5 V
Lead Channel Pacing Threshold Amplitude: 0.625 V
Lead Channel Pacing Threshold Pulse Width: 0.4 ms
Lead Channel Pacing Threshold Pulse Width: 0.4 ms
Lead Channel Sensing Intrinsic Amplitude: 1.875 mV
Lead Channel Sensing Intrinsic Amplitude: 1.875 mV
Lead Channel Sensing Intrinsic Amplitude: 10.75 mV
Lead Channel Sensing Intrinsic Amplitude: 10.75 mV
Lead Channel Setting Pacing Amplitude: 1.5 V
Lead Channel Setting Pacing Amplitude: 2 V
Lead Channel Setting Pacing Pulse Width: 0.4 ms
Lead Channel Setting Sensing Sensitivity: 0.3 mV

## 2020-03-11 NOTE — Progress Notes (Signed)
ICD Remote  

## 2020-03-30 ENCOUNTER — Other Ambulatory Visit: Payer: Self-pay | Admitting: Internal Medicine

## 2020-04-20 ENCOUNTER — Telehealth: Payer: Self-pay | Admitting: *Deleted

## 2020-04-20 NOTE — Telephone Encounter (Signed)
Schedule AWV.  

## 2020-04-24 ENCOUNTER — Telehealth: Payer: Self-pay | Admitting: Family Medicine

## 2020-04-24 NOTE — Telephone Encounter (Signed)
Pt called per CRM while we were closed / LVM for patient to call us back

## 2020-04-25 ENCOUNTER — Other Ambulatory Visit: Payer: Self-pay | Admitting: Family Medicine

## 2020-04-25 DIAGNOSIS — I10 Essential (primary) hypertension: Secondary | ICD-10-CM

## 2020-04-25 DIAGNOSIS — E785 Hyperlipidemia, unspecified: Secondary | ICD-10-CM

## 2020-04-28 ENCOUNTER — Other Ambulatory Visit: Payer: Self-pay | Admitting: Family Medicine

## 2020-04-28 DIAGNOSIS — E1165 Type 2 diabetes mellitus with hyperglycemia: Secondary | ICD-10-CM

## 2020-05-13 ENCOUNTER — Ambulatory Visit: Payer: Medicare PPO | Admitting: Family Medicine

## 2020-05-13 ENCOUNTER — Other Ambulatory Visit: Payer: Self-pay

## 2020-05-13 ENCOUNTER — Encounter: Payer: Self-pay | Admitting: Family Medicine

## 2020-05-13 VITALS — BP 122/70 | HR 93 | Temp 98.1°F | Ht 65.5 in | Wt 189.0 lb

## 2020-05-13 DIAGNOSIS — M2011 Hallux valgus (acquired), right foot: Secondary | ICD-10-CM | POA: Diagnosis not present

## 2020-05-13 DIAGNOSIS — I10 Essential (primary) hypertension: Secondary | ICD-10-CM | POA: Diagnosis not present

## 2020-05-13 DIAGNOSIS — E785 Hyperlipidemia, unspecified: Secondary | ICD-10-CM

## 2020-05-13 DIAGNOSIS — R208 Other disturbances of skin sensation: Secondary | ICD-10-CM | POA: Diagnosis not present

## 2020-05-13 DIAGNOSIS — E1165 Type 2 diabetes mellitus with hyperglycemia: Secondary | ICD-10-CM

## 2020-05-13 MED ORDER — EMPAGLIFLOZIN 10 MG PO TABS: 10.0000 mg | ORAL_TABLET | Freq: Every day | ORAL | 2 refills | Status: DC

## 2020-05-13 MED ORDER — OLMESARTAN MEDOXOMIL-HCTZ 20-12.5 MG PO TABS: 1.0000 | ORAL_TABLET | Freq: Every day | ORAL | 2 refills | Status: DC

## 2020-05-13 MED ORDER — ATORVASTATIN CALCIUM 10 MG PO TABS: 10.0000 mg | ORAL_TABLET | Freq: Every day | ORAL | 2 refills | Status: DC

## 2020-05-13 MED ORDER — GABAPENTIN 100 MG PO CAPS: 100.0000 mg | ORAL_CAPSULE | Freq: Every day | ORAL | 1 refills | Status: DC

## 2020-05-13 MED ORDER — METFORMIN HCL 500 MG PO TABS: 500.0000 mg | ORAL_TABLET | Freq: Every day | ORAL | 3 refills | Status: DC

## 2020-05-13 NOTE — Patient Instructions (Addendum)
  Change Metformin to 500 mg once per day.  We will try starting Jardiance.  If that is covered and tolerated, can increase to the higher dose.  Monitor blood sugars.  I will check A1c today, recheck 3 months.  Gabapentin once at bedtime can help with burning sensation in feet which may be related to diabetes.  I would also recommend discussing this with podiatry and I have referred you to evaluate the bunion as well.  If any swelling, shortness of breath, you may need to restart furosemide.  Keep follow-up with Dr. Caryl Comes.  Return to the clinic or go to the nearest emergency room if any of your symptoms worsen or new symptoms occur.    If you have lab work done today you will be contacted with your lab results within the next 2 weeks.  If you have not heard from [name] then please contact us. The fastest way to get your results is to register for My Chart.   IF you received an x-ray today, you will receive an invoice from [name]. Please contact [facility] Radiology at [phone] with questions or concerns regarding your invoice.   IF you received labwork today, you will receive an invoice from [place]. Please contact [facility] at [phone] with questions or concerns regarding your invoice.   Our billing staff will not be able to assist you with questions regarding bills from these companies.  You will be contacted with the lab results as soon as they are available. The fastest way to get your results is to activate your My Chart account. Instructions are located on the last page of this paperwork. If you have not heard from [name] regarding the results in 2 weeks, please contact this office.

## 2020-05-13 NOTE — Progress Notes (Signed)
Subjective:  Patient ID: Dakota Gutierrez, male    DOB: 06-Jun-1951  Age: 69 y.o. MRN: [id number]  CC:  Chief Complaint  Patient presents with  . Diabetes    pt reports no issues with this condition since last OV. pt states last visit he was told he would be given an alternitive medication for his metformin due to it giving the pt diarreah. pt states his BS seems to jump around pt takes his BS 2x daily and it jumps from 120's-170's.  . Knee Pain    pt has not wore a brace for 2 weeks now and is seening someone else for this issue.  . Foot Pain    pt reports having foot pain in both feet, but mostly in the R foot. pain comes and gos, but mostly hurts the pt before bed. pain is located on the top of pt's feet. current pain level 1/10. pain level in the evening is 4/10.    HPI Dakota Gutierrez presents for   Follow-up of chronic medical conditions   Diabetes: Complicated by prior hyperglycemia, A1c in December 7.0.  Metformin decreased to 500 mg twice per day, started [place] 5 mg daily.  Continued on statin, still on ARB. Did not start farxiga - was not covered.  Still bloating with BID 500mg  dose of metformin, but once per day has been tolerated.   microalbumin: Normal ratio in November 2019 Optho, foot exam, pneumovax: Up-to-date Covid vaccine:   Lab Results  Component Value Date   HGBA1C 6.8 (H) 05/13/2020   HGBA1C 7.0 (A) 11/14/2019   HGBA1C 7.8 (H) 08/14/2019   Lab Results  Component Value Date   MICROALBUR 0.7 09/15/2016   LDLCALC 54 05/13/2020   CREATININE 0.98 05/13/2020    Hypertension: Followed by cardiology for history of ventricular tachycardia.  Appointment 11/26/2019.  Short-term furosemide provided at that time for volume overload.  Repeat echo was ordered.  EF 60 to 65%.  Normal left ventricular function. Takes Toprol XL 100 mg daily, Benicar HCT 20/12.5 mg daily, furosemide as needed as above, amiodarone 200 mg one half daily  Up 12 # from 177  11/14/19.  10 pound gain past month.  No recent need for furosemide. No edema. appt with Dr. Caryl Comes next month.  Not walking due to knee pain - followed by Dr. Stann Mainland. Significant decrease in exercise. Looking into possible knee replacement- looking into meeting with other surgeon.   Wt Readings from Last 3 Encounters:  05/13/20 189 lb (85.7 kg)  11/26/19 184 lb (83.5 kg)  11/14/19 177 lb (80.3 kg)   Home readings: similar to here - 125/70-80 range.  BP Readings from Last 3 Encounters:  05/13/20 122/70  11/26/19 140/78  11/14/19 118/71   Lab Results  Component Value Date   CREATININE 0.98 05/13/2020   Hyperlipidemia: Takes Lipitor 10 mg daily.  On aspirin 81 mg daily. Lab Results  Component Value Date   CHOL 118 05/13/2020   HDL 32 (L) 05/13/2020   LDLCALC 54 05/13/2020   LDLDIRECT 90.5 08/06/2013   TRIG 195 (H) 05/13/2020   CHOLHDL 3.7 05/13/2020   Lab Results  Component Value Date   ALT 18 05/13/2020   AST 24 05/13/2020   ALKPHOS 42 (L) 05/13/2020   BILITOT 0.4 05/13/2020    Foot pain: Burning - top of r greater than left foot. No injury.  Sore in toe at times. Some redness in R great toe at times. Plast 6 months of burning. Tylenol  at times for knee - mod relief.    History Patient Active Problem List   Diagnosis Date Noted  . Hypertension associated with type 2 diabetes mellitus ([place]) 11/14/2019  . Type II diabetes mellitus ([place]) 01/16/2015  . Hypersomnolence 08/14/2012  . History of Helicobacter pylori infection 04/16/2012  . GERD (gastroesophageal reflux disease) 04/16/2012  . Elevated transaminase level 02/02/2012  . Obesity 12/29/2011  . 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 Syncope   . Ventricular tachycardia ([place])   . Anxiety    Past Medical History:  Diagnosis Date  . 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 Diabetes mellitus without complication ([place])   . Dual implantable cardiac defibrillator in situ     Medtronic D314DRG Protecta  . Elevated transaminase level    10/2011  ? amio  . GERD (gastroesophageal reflux disease)   . HTN (hypertension)   . Other primary cardiomyopathies   . Syncope   . Tubular adenoma of colon   . Ventricular tachycardia ([place])    storm following ICD implant on amiodarone    Past Surgical History:  Procedure Laterality Date  . CARDIAC DEFIBRILLATOR PLACEMENT     Medtronic D314DRG Protecta  . CATARACT EXTRACTION, BILATERAL  2019  . CHOLECYSTECTOMY    . COLONOSCOPY WITH PROPOFOL N/A 06/30/2017   Procedure: COLONOSCOPY WITH PROPOFOL;  Surgeon: Carol Ada, MD;  Location: [facility];  Service: Endoscopy;  Laterality: N/A;  . GALLBLADDER SURGERY    . HERNIA REPAIR    . ICD GENERATOR CHANGEOUT N/A 04/26/2017   Procedure: ICD Generator Changeout;  Surgeon: [name], MD;  Location: [place] [facility];  Service: Cardiovascular;  Laterality: N/A;  . TONSILLECTOMY AND ADENOIDECTOMY     Allergies  Allergen Reactions  . Adhesive [Tape]     Swelling and welts formed   Prior to Admission medications   Medication Sig Start Date End Date Taking? Authorizing Provider  acetaminophen (TYLENOL) 500 MG tablet Take 1,000 mg by mouth daily as needed for moderate pain or headache.   Yes [provider]  amiodarone (PACERONE) 200 MG tablet Take 1/2 (one-half) tablet by mouth once daily 03/30/20  Yes [name], MD  aspirin EC 81 MG tablet Take 1 tablet (81 mg total) by mouth daily. 11/11/11  Yes [name], MD  atorvastatin (LIPITOR) 10 MG tablet TAKE 1 TABLET BY MOUTH EVERY DAY 04/25/20  Yes Wendie Agreste, MD  Cholecalciferol (VITAMIN D) 2000 UNITS tablet Take 2,000 Units by mouth daily.   Yes [provider]  famotidine (PEPCID) 20 MG tablet Take 1 tablet (20 mg total) by mouth 2 (two) times daily as needed. 04/16/12  Yes Pyrtle, Lajuan Lines, MD  metFORMIN (GLUCOPHAGE) 500 MG tablet Take 1 tablet (500 mg total) by mouth 2 (two) times daily with a  meal. 11/14/19  Yes Sagardia, Ines Bloomer, MD  metFORMIN (GLUCOPHAGE) 850 MG tablet TAKE 1 TABLET (850 MG TOTAL) BY MOUTH 2 (TWO) TIMES DAILY WITH A MEAL. 04/28/20  Yes Wendie Agreste, MD  metoprolol succinate (TOPROL-XL) 100 MG 24 hr tablet TAKE 1 TABLET BY MOUTH ONCE DAILY(IMMEDIATLY FOLLOWING A MEAL) 10/02/19  Yes [name], MD  olmesartan-hydrochlorothiazide (BENICAR HCT) 20-12.5 MG tablet TAKE 1 TABLET BY MOUTH EVERY DAY 04/25/20  Yes Wendie Agreste, MD  Omega-3 Fatty Acids (FISH OIL) 1000 MG CAPS Take 1,000 mg by mouth 2 (two) times daily.    Yes [provider]  OVER THE COUNTER  MEDICATION    Yes [provider]  tadalafil (CIALIS) 20 MG tablet Take 0.5-1 tablets (10-20 mg total) by mouth daily as needed for erectile dysfunction. 09/15/16  Yes Wendie Agreste, MD  furosemide (LASIX) 20 MG tablet Take 1 tablet (20 mg total) by mouth daily as needed (SOB). 11/26/19 02/24/20  [name], MD   Social History   Socioeconomic History  . Marital status: Married    Spouse name: Not on file  . Number of children: 0  . Years of education: Not on file  . Highest education level: Bachelor's degree (e.g., BA, AB, BS)  Occupational History  . Occupation: Retired Pharmacist, hospital  Tobacco Use  . Smoking status: Never Smoker  . Smokeless tobacco: Never Used  Vaping Use  . Vaping Use: Never used  Substance and Sexual Activity  . Alcohol use: Yes    Alcohol/week: 2.0 standard drinks    Types: 2 Cans of beer per week    Comment: 4/week  . Drug use: No  . Sexual activity: Yes  Other Topics Concern  . Not on file  Social History Narrative   Divorced. Education: [employer]. Exercise: walking 3 times a week for 30 minutes.   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 Difficulty of Paying Living Expenses:   Food Insecurity:   . Worried About Charity fundraiser in the Last Year:   . Arboriculturist in the Last Year:   Transportation  Needs:   . Film/video editor (Medical):   Marland Kitchen Lack of Transportation (Non-Medical):   Physical Activity:   . Days of Exercise per Week:   . Minutes of Exercise per Session:   Stress:   . Feeling of Stress :   Social Connections:   . Frequency of Communication with Friends and Family:   . Frequency of Social Gatherings with Friends and Family:   . Attends Religious Services:   . Active Member of Clubs or Organizations:   . Attends Archivist Meetings:   Marland Kitchen Marital Status:   Intimate Partner Violence:   . Fear of Current or Ex-Partner:   . Emotionally Abused:   Marland Kitchen Physically Abused:   . Sexually Abused:     Review of Systems   Objective:   Vitals:   05/13/20 1045 05/13/20 1057  BP: (!) 142/80 122/70  Pulse: 93   Temp: 98.1 F (36.7 C)   TempSrc: Temporal   SpO2: 98%   Weight: 189 lb (85.7 kg)   Height: 5' 5.5" (1.664 m)      Physical Exam Vitals reviewed.  Constitutional:      Appearance: He is well-developed.  HENT:     Head: Normocephalic and atraumatic.  Eyes:     Pupils: Pupils are equal, round, and reactive to light.  Neck:     Vascular: No carotid bruit or JVD.  Cardiovascular:     Rate and Rhythm: Normal rate and regular rhythm.     Heart sounds: Normal heart sounds. No murmur heard.   Pulmonary:     Effort: Pulmonary effort is normal.     Breath sounds: Normal breath sounds. No rales.  Musculoskeletal:     Comments: Right foot, hallux valgus deformity, reports area of burning sensation on dorsum of foot right  greater than left, sensation intact distally. Possible slight irritation of the medial lateral nail fold at great toe of right foot but no exudate, shortened toenail.  Skin:    General: Skin is warm and dry.  Neurological:     Mental Status: He is alert and oriented to person, place, and time.      Assessment & Plan:  Dakota Gutierrez is a 69 y.o. male . Burning sensation of foot - Plan: gabapentin (NEURONTIN) 100 MG  capsule Hallux valgus (acquired), right foot - Plan: Ambulatory referral to Podiatry  -Hallux valgus deformity, but other areas of dysesthesia possible peripheral neuropathy with history of diabetes.  Trial of low-dose gabapentin nightly, refer to podiatry for eval as well as to discuss hallux valgus.  Hyperlipidemia, unspecified hyperlipidemia type - Plan: atorvastatin (LIPITOR) 10 MG tablet, Lipid panel, Comprehensive metabolic panel  -Tolerating Lipitor, check labs, continue same  Type 2 diabetes mellitus with hyperglycemia, without long-term current use of insulin (HCC) - Plan: metFORMIN (GLUCOPHAGE) 500 MG tablet, Hemoglobin A1c, empagliflozin (JARDIANCE) 10 MG TABS tablet, Microalbumin / creatinine urine ratio  -Difficulty with tolerance of Metformin.  We will lower dose of Metformin 500 mg daily, start Jardiance, then if tolerated can increase to 25 mg daily.  Potential side effects discussed.  Essential hypertension - Plan: olmesartan-hydrochlorothiazide (BENICAR HCT) 20-12.5 MG tablet, Lipid panel, Comprehensive metabolic panel  -  Stable, tolerating current regimen. Medications refilled. Labs pending as above.   -Increased weight, discussed potential need for restart of furosemide if any dyspnea, pedal edema, but does not appear fluid overloaded at present.   Meds ordered this encounter  Medications  . atorvastatin (LIPITOR) 10 MG tablet    Sig: Take 1 tablet (10 mg total) by mouth daily.    Dispense:  90 tablet    Refill:  2  . metFORMIN (GLUCOPHAGE) 500 MG tablet    Sig: Take 1 tablet (500 mg total) by mouth daily with breakfast.    Dispense:  90 tablet    Refill:  3  . olmesartan-hydrochlorothiazide (BENICAR HCT) 20-12.5 MG tablet    Sig: Take 1 tablet by mouth daily.    Dispense:  90 tablet    Refill:  2  . empagliflozin (JARDIANCE) 10 MG TABS tablet    Sig: Take 1 tablet (10 mg total) by mouth daily before breakfast.    Dispense:  90 tablet    Refill:  2  . gabapentin  (NEURONTIN) 100 MG capsule    Sig: Take 1 capsule (100 mg total) by mouth at bedtime.    Dispense:  90 capsule    Refill:  1   Patient Instructions    Change Metformin to 500 mg once per day.  We will try starting Jardiance.  If that is covered and tolerated, can increase to the higher dose.  Monitor blood sugars.  I will check A1c today, recheck 3 months.  Gabapentin once at bedtime can help with burning sensation in feet which may be related to diabetes.  I would also recommend discussing this with podiatry and I have referred you to evaluate the bunion as well.  If any swelling, shortness of breath, you may need to restart furosemide.  Keep follow-up with Dr. Caryl Comes.  Return to the clinic or go to the nearest emergency room if any of your symptoms worsen or new symptoms occur.    If you have lab work done today you will be contacted with your lab results within the next 2 weeks.  If you have not heard from [name] then please contact us. The fastest way to get your results is to register for My Chart.  IF you received an x-ray today, you will receive an invoice from [facility] Radiology. Please contact [facility] Radiology at [phone] with questions or concerns regarding your invoice.   IF you received labwork today, you will receive an invoice from [place]. Please contact [facility] at [phone] with questions or concerns regarding your invoice.   Our billing staff will not be able to assist you with questions regarding bills from these companies.  You will be contacted with the lab results as soon as they are available. The fastest way to get your results is to activate your My Chart account. Instructions are located on the last page of this paperwork. If you have not heard from [name] regarding the results in 2 weeks, please contact this office.         Signed, [name], MD [facility]

## 2020-05-14 LAB — LIPID PANEL
Chol/HDL Ratio: 3.7 ratio (ref 0.0–5.0)
Cholesterol, Total: 118 mg/dL (ref 100–199)
HDL: 32 mg/dL — ABNORMAL LOW (ref >39)
LDL Chol Calc (NIH): 54 mg/dL (ref 0–99)
Triglycerides: 195 mg/dL — ABNORMAL HIGH (ref 0–149)
VLDL Cholesterol Cal: 32 mg/dL (ref 5–40)

## 2020-05-14 LAB — MICROALBUMIN / CREATININE URINE RATIO
Creatinine, Urine: 182.5 mg/dL
Microalb/Creat Ratio: 8 mg/g creat (ref 0–29)
Microalbumin, Urine: 13.9 ug/mL

## 2020-05-14 LAB — COMPREHENSIVE METABOLIC PANEL
ALT: 18 IU/L (ref 0–44)
AST: 24 IU/L (ref 0–40)
Albumin/Globulin Ratio: 2.2 (ref 1.2–2.2)
Albumin: 4.6 g/dL (ref 3.7–4.7)
Alkaline Phosphatase: 42 IU/L — ABNORMAL LOW (ref 48–121)
BUN/Creatinine Ratio: 15 (ref 10–24)
BUN: 15 mg/dL (ref 8–27)
Bilirubin Total: 0.4 mg/dL (ref 0.0–1.2)
CO2: 23 mmol/L (ref 20–29)
Calcium: 9.2 mg/dL (ref 8.6–10.2)
Chloride: 102 mmol/L (ref 96–106)
Creatinine, Ser: 0.98 mg/dL (ref 0.76–1.27)
GFR calc Af Amer: 89 mL/min/{1.73_m2} (ref >59)
GFR calc non Af Amer: 77 mL/min/{1.73_m2} (ref >59)
Globulin, Total: 2.1 g/dL (ref 1.5–4.5)
Glucose: 150 mg/dL — ABNORMAL HIGH (ref 65–99)
Potassium: 4.3 mmol/L (ref 3.5–5.2)
Sodium: 140 mmol/L (ref 134–144)
Total Protein: 6.7 g/dL (ref 6.0–8.5)

## 2020-05-14 LAB — HEMOGLOBIN A1C
Est. average glucose Bld gHb Est-mCnc: 148 mg/dL
Hgb A1c MFr Bld: 6.8 % — ABNORMAL HIGH (ref 4.8–5.6)

## 2020-05-15 ENCOUNTER — Encounter: Payer: Self-pay | Admitting: Family Medicine

## 2020-05-22 ENCOUNTER — Ambulatory Visit: Payer: Medicare PPO | Admitting: Podiatry

## 2020-05-22 ENCOUNTER — Other Ambulatory Visit: Payer: Self-pay

## 2020-05-22 ENCOUNTER — Ambulatory Visit (INDEPENDENT_AMBULATORY_CARE_PROVIDER_SITE_OTHER): Payer: Medicare PPO

## 2020-05-22 ENCOUNTER — Encounter: Payer: Self-pay | Admitting: Podiatry

## 2020-05-22 DIAGNOSIS — M79671 Pain in right foot: Secondary | ICD-10-CM | POA: Diagnosis not present

## 2020-05-22 DIAGNOSIS — M21611 Bunion of right foot: Secondary | ICD-10-CM | POA: Diagnosis not present

## 2020-05-22 DIAGNOSIS — M898X7 Other specified disorders of bone, ankle and foot: Secondary | ICD-10-CM

## 2020-05-22 DIAGNOSIS — M2041 Other hammer toe(s) (acquired), right foot: Secondary | ICD-10-CM

## 2020-05-22 DIAGNOSIS — M21619 Bunion of unspecified foot: Secondary | ICD-10-CM | POA: Diagnosis not present

## 2020-05-22 DIAGNOSIS — M21861 Other specified acquired deformities of right lower leg: Secondary | ICD-10-CM

## 2020-05-22 DIAGNOSIS — M216X1 Other acquired deformities of right foot: Secondary | ICD-10-CM

## 2020-05-22 MED ORDER — DICLOFENAC SODIUM 1 % EX GEL: 4.0000 g | Freq: Four times a day (QID) | CUTANEOUS | 2 refills | Status: DC

## 2020-05-22 NOTE — Patient Instructions (Addendum)
Arthritis Arthritis is a term that is commonly used to refer to joint pain or joint disease. There are more than 100 types of arthritis. What are the causes? The most common cause of this condition is wear and tear of a joint. Other causes include:  Gout.  Inflammation of a joint.  An infection of a joint.  Sprains and other injuries near the joint.  A reaction to medicines or drugs, or an allergic reaction. In some cases, the cause may not be known. What are the signs or symptoms? The main symptom of this condition is pain in the joint during movement. Other symptoms include:  Redness, swelling, or stiffness at a joint.  Warmth coming from the joint.  Fever.  Overall feeling of illness. How is this diagnosed? This condition may be diagnosed with a physical exam and tests, including:  Blood tests.  Urine tests.  Imaging tests, such as X-rays, an MRI, or a CT scan. If your pain is not improving, we will likely get a CT Scan Sometimes, fluid is removed from a joint for testing. How is this treated? This condition may be treated with:  Treatment of the cause, if it is known.  Rest.  Raising (elevating) the joint.  Applying cold or hot packs to the joint.  Medicines to improve symptoms and reduce inflammation.  Injections of a steroid such as cortisone into the joint to help reduce pain and inflammation. Depending on the cause of your arthritis, you may need to make lifestyle changes to reduce stress on your joint. Changes may include:  Exercising more.  Losing weight.  Try lacing up your shoes differently to avoid putting the shoe laces directly over the painful area Follow these instructions at home: Medicines  Take over-the-counter and prescription medicines only as told by your health care provider.  Do not take aspirin to relieve pain if your health care provider thinks that gout may be causing your pain. Activity  Rest your joint if told by your health  care provider. Rest is important when your disease is active and your joint feels painful, swollen, or stiff.  Avoid activities that make the pain worse. It is important to balance activity with rest.  Exercise your joint regularly with range-of-motion exercises as told by your health care provider. Try doing low-impact exercise, such as: ? Swimming. ? Water aerobics. ? Biking. ? Walking. Managing pain, stiffness, and swelling      If directed, put ice on the joint. ? Put ice in a plastic bag. ? Place a towel between your skin and the bag. ? Leave the ice on for 20 minutes, 2-3 times per day.  If your joint is swollen, raise (elevate) it above the level of your heart if directed by your health care provider.  If your joint feels stiff in the morning, try taking a warm shower.  If directed, apply heat to the affected area as often as told by your health care provider. Use the heat source that your health care provider recommends, such as a moist heat pack or a heating pad. If you have diabetes, do not apply heat without permission from your health care provider. To apply heat: ? Place a towel between your skin and the heat source. ? Leave the heat on for 20-30 minutes. ? Remove the heat if your skin turns bright red. This is especially important if you are unable to feel pain, heat, or cold. You may have a greater risk of getting burned. General  instructions  Do not use any products that contain nicotine or tobacco, such as cigarettes, e-cigarettes, and chewing tobacco. If you need help quitting, ask your health care provider.  Keep all follow-up visits as told by your health care provider. This is important. Contact a health care provider if:  The pain gets worse.  You have a fever. Get help right away if:  You develop severe joint pain, swelling, or redness.  Many joints become painful and swollen.  You develop severe back pain.  You develop severe weakness in your  leg.  You cannot control your bladder or bowels. Summary  Arthritis is a term that is commonly used to refer to joint pain or joint disease. There are more than 100 types of arthritis.  The most common cause of this condition is wear and tear of a joint. Other causes include gout, inflammation or infection of the joint, sprains, or allergies.  Symptoms of this condition include redness, swelling, or stiffness of the joint. Other symptoms include warmth, fever, or feeling ill.  This condition is treated with rest, elevation, medicines, and applying cold or hot packs.  Follow your health care provider's instructions about medicines, activity, exercises, and other home care treatments. This information is not intended to replace advice given to you by your health care provider. Make sure you discuss any questions you have with your health care provider. Document Revised: 10/22/2018 Document Reviewed: 10/22/2018 [employer] Patient Education  [employer]  A bunion is a bump on the base of the big toe that forms when the bones of the big toe joint move out of position. Bunions may be small at first, but they often get larger over time. They can make walking painful. What are the causes? A bunion may be caused by:  Wearing narrow or pointed shoes that force the big toe to press against the other toes.  Abnormal foot development that causes the foot to roll inward (pronate).  Changes in the foot that are caused by certain diseases, such as rheumatoid arthritis or polio.  A foot injury. What increases the risk? The following factors may make you more likely to develop this condition:  Wearing shoes that squeeze the toes together.  Having certain diseases, such as: ? Rheumatoid arthritis. ? Polio. ? Cerebral palsy.  Having family members who have bunions.  Being born with a foot deformity, such as flat feet or low arches.  Doing activities that put a lot of pressure  on the feet, such as ballet dancing. What are the signs or symptoms? The main symptom of a bunion is a noticeable bump on the big toe. Other symptoms may include:  Pain.  Swelling around the big toe.  Redness and inflammation.  Thick or hardened skin on the big toe or between the toes.  Stiffness or loss of motion in the big toe.  Trouble with walking. How is this diagnosed? A bunion may be diagnosed based on your symptoms, medical history, and activities. You may have tests, such as:  X-rays. These allow your health care provider to check the position of the bones in your foot and look for damage to your joint. They also help your health care provider determine the severity of your bunion and the best way to treat it.  Joint aspiration. In this test, a sample of fluid is removed from the toe joint. This test may be done if you are in a lot of pain. It helps rule out diseases  that cause painful swelling of the joints, such as arthritis. How is this treated? Treatment depends on the severity of your symptoms. The goal of treatment is to relieve symptoms and prevent the bunion from getting worse. Your health care provider may recommend:  Wearing shoes that have a wide toe box.  Using bunion pads to cushion the affected area.  Taping your toes together to keep them in a normal position.  Placing a device inside your shoe (orthotics) to help reduce pressure on your toe joint.  Taking medicine to ease pain, inflammation, and swelling.  Applying heat or ice to the affected area.  Doing stretching exercises.  Surgery to remove scar tissue and move the toes back into their normal position. This treatment is rare. The likely procedure that would benefit your bunion correction would be called a Lapidus procedure, or a fusion of the 1st tarsometatarsal joints Follow these instructions at home: Managing pain, stiffness, and swelling   If directed, put ice on the painful area: ? Put  ice in a plastic bag. ? Place a towel between your skin and the bag. ? Leave the ice on for 20 minutes, 2-3 times a day. Activity   If directed, apply heat to the affected area before you exercise. Use the heat source that your health care provider recommends, such as a moist heat pack or a heating pad. ? Place a towel between your skin and the heat source. ? Leave the heat on for 20-30 minutes. ? Remove the heat if your skin turns bright red. This is especially important if you are unable to feel pain, heat, or cold. You may have a greater risk of getting burned.  Do exercises as told by your health care provider. General instructions  Support your toe joint with proper footwear, shoe padding, or taping as told by your health care provider.  Take over-the-counter and prescription medicines only as told by your health care provider.  Keep all follow-up visits as told by your health care provider. This is important. Contact a health care provider if your symptoms:  Get worse.  Do not improve in 2 weeks. Get help right away if you have:  Severe pain and trouble with walking. Summary  A bunion is a bump on the base of the big toe that forms when the bones of the big toe joint move out of position.  Bunions can make walking painful.  Treatment depends on the severity of your symptoms.  Support your toe joint with proper footwear, shoe padding, or taping as told by your health care provider. This information is not intended to replace advice given to you by your health care provider. Make sure you discuss any questions you have with your health care provider. Document Revised: 05/21/2018 Document Reviewed: 03/27/2018 Elsevier Patient Education  [place].

## 2020-05-22 NOTE — Progress Notes (Signed)
Subjective:  Patient ID: Dakota Gutierrez, male    DOB: 1951-08-12,  MRN: [id number]  Chief Complaint  Patient presents with  . Bunions    right foot    68 y.o. male presents with the above complaint.  Is a pain on the top of the foot for about 1 year on the right side.  He does not recall any specific injury, although he does state [place] and may have kicked his foot once or twice and potentially broken this, but not a specific incident that he recalls where he was treated for this by a physician.  He also states that he has a toenail that is starting to feel like it is ingrown.  He discussed with his PCP Dr. Cindee Lame who referred him here, and he recommend that he just let the toenail grow out.  The foot pain on the right is a aching deep pain that he has and he localizes this to the mid dorsal arch.  He does have diabetes and his last A1c was 6.8%.  He runs and this is particular painful during running, and he would like to continue to do this. Past Medical History:  Diagnosis Date  . 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 Diabetes mellitus without complication ([place])   . Dual implantable cardiac defibrillator in situ    Medtronic [id number] Protecta  . Elevated transaminase level    10/2011  ? amio  . GERD (gastroesophageal reflux disease)   . HTN (hypertension)   . Other primary cardiomyopathies   . Syncope   . Tubular adenoma of colon   . Ventricular tachycardia ([place])    storm following ICD implant on amiodarone    Past Surgical History:  Procedure Laterality Date  . CARDIAC DEFIBRILLATOR PLACEMENT     Medtronic [id number] Protecta  . CATARACT EXTRACTION, BILATERAL  2019  . CHOLECYSTECTOMY    . COLONOSCOPY WITH PROPOFOL N/A 06/30/2017   Procedure: COLONOSCOPY WITH PROPOFOL;  Surgeon: Carol Ada, MD;  Location: [facility];  Service: Endoscopy;  Laterality: N/A;  . GALLBLADDER SURGERY    . HERNIA REPAIR    . ICD GENERATOR CHANGEOUT N/A 04/26/2017    Procedure: ICD Generator Changeout;  Surgeon: [name], MD;  Location: [place] [facility];  Service: Cardiovascular;  Laterality: N/A;  . TONSILLECTOMY AND ADENOIDECTOMY      Current Outpatient Medications:  .  acetaminophen (TYLENOL) 500 MG tablet, Take 1,000 mg by mouth daily as needed for moderate pain or headache., Disp: , Rfl:  .  amiodarone (PACERONE) 200 MG tablet, Take 1/2 (one-half) tablet by mouth once daily, Disp: 45 tablet, Rfl: 3 .  aspirin EC 81 MG tablet, Take 1 tablet (81 mg total) by mouth daily., Disp: , Rfl:  .  atorvastatin (LIPITOR) 10 MG tablet, Take 1 tablet (10 mg total) by mouth daily., Disp: 90 tablet, Rfl: 2 .  Cholecalciferol (VITAMIN D) 2000 UNITS tablet, Take 2,000 Units by mouth daily., Disp: , Rfl:  .  diclofenac Sodium (VOLTAREN) 1 % GEL, Apply 4 g topically 4 (four) times daily. Rub into affected area of foot 2 to 4 times daily, Disp: 100 g, Rfl: 2 .  empagliflozin (JARDIANCE) 10 MG TABS tablet, Take 1 tablet (10 mg total) by mouth daily before breakfast., Disp: 90 tablet, Rfl: 2 .  famotidine (PEPCID) 20 MG tablet, Take 1 tablet (20 mg total) by mouth 2 (two) times daily as needed., Disp: 60  tablet, Rfl: 4 .  furosemide (LASIX) 20 MG tablet, Take 1 tablet (20 mg total) by mouth daily as needed (SOB)., Disp: 90 tablet, Rfl: 0 .  gabapentin (NEURONTIN) 100 MG capsule, Take 1 capsule (100 mg total) by mouth at bedtime., Disp: 90 capsule, Rfl: 1 .  metFORMIN (GLUCOPHAGE) 500 MG tablet, Take 1 tablet (500 mg total) by mouth daily with breakfast., Disp: 90 tablet, Rfl: 3 .  metoprolol succinate (TOPROL-XL) 100 MG 24 hr tablet, TAKE 1 TABLET BY MOUTH ONCE DAILY(IMMEDIATLY FOLLOWING A MEAL), Disp: 90 tablet, Rfl: 3 .  olmesartan-hydrochlorothiazide (BENICAR HCT) 20-12.5 MG tablet, Take 1 tablet by mouth daily., Disp: 90 tablet, Rfl: 2 .  Omega-3 Fatty Acids (FISH OIL) 1000 MG CAPS, Take 1,000 mg by mouth 2 (two) times daily. , Disp: , Rfl:  .  OVER THE COUNTER  MEDICATION, , Disp: , Rfl:  .  tadalafil (CIALIS) 20 MG tablet, Take 0.5-1 tablets (10-20 mg total) by mouth daily as needed for erectile dysfunction., Disp: 10 tablet, Rfl: 11 .  traMADol (ULTRAM) 50 MG tablet, , Disp: , Rfl:   Allergies  Allergen Reactions  . Adhesive [Tape]     Swelling and welts formed   Review of Systems: Negative except as noted in the HPI. Denies N/V/F/Ch. Objective:  There were no vitals filed for this visit. General AA&O x3. Normal mood and affect.  Vascular Dorsalis pedis and posterior tibial pulses  present 2+ bilaterally  Capillary refill normal to all digits. Pedal hair growth normal.  Neurologic Epicritic sensation grossly present.  Dermatologic No open lesions. Interspaces clear of maceration. Nails well groomed, there is slight incurvation of the medial border of the right hallux without deep ingrowth or paronychia.  Orthopedic: MMT 5/5 in dorsiflexion, plantarflexion, inversion, and eversion. He does have a palpable bony prominence over the dorsal mid arch.  This appears to correspond to the cuneiform area.  Hallux valgus present semirigid hammertoes also present.  His first MTPJ range of motion is smooth and pain-free with a track bound hallux.  Minor crepitance with range of motion of the metatarsals on the tarsometatarsal joint.  Silfverskiold test positive for gastrocnemius equinus.   Radiology 3 standing views of the right foot were taken.  Severe hallux valgus with an atavistic cuneiform of the tarsometatarsal joint is noted.  He does appear to have a short second ray although this is unclear to me if this is a segmental defect in the metatarsal or previous fracture in the midtarsal joint.  There is exostosis versus nonunited bony fragment of the dorsal foot on the lateral view.  Negative calcaneal pitch noted  Assessment & Plan:   Encounter Diagnoses  Name Primary?  . Bunion Yes  . Exostosis of right foot   . Hammertoe of right foot   . Pain  of right foot   . Gastrocnemius equinus of right lower extremity     Patient was evaluated and treated and all questions answered.   Equinus, Arthritis and Bunion -XR reviewed with patient -Educated on etiology of deformity, and midfoot arthritis.  It is possible that this is secondary to a previous injury in childhood.  His diabetes has always been well controlled, and he has good sensation to both feet.  I do not think that this is Charcot arthropathy. -Prescription for Voltaren gel sent to pharmacy.  He will try this over the painful areas.  The body itself is not.  Painful right now it is more focused over the midfoot. -  Discussed proper shoe gear modifications and padding -Follow-up with me in 2 to 3 months for reevaluation.  If he is interested in surgical correction of both issues then we will likely order a CT scan to evaluate the integrity quality of the tarsometatarsal joints as well as the position of the dorsal exostosis/fragment.   Lanae Crumbly, DPM 05/22/2020    Return in about 2 months (around 07/22/2020) for right foot bunion/arthritis.

## 2020-05-29 DIAGNOSIS — G4733 Obstructive sleep apnea (adult) (pediatric): Secondary | ICD-10-CM | POA: Diagnosis not present

## 2020-06-01 NOTE — Progress Notes (Signed)
Cardiology Office Note Date:  06/02/2020  Patient ID:  Dakota Gutierrez, Dakota Gutierrez 1951-10-06, MRN [id number] PCP:  Wendie Agreste, MD  Cardiologist:  Dr. Caryl Comes   Chief Complaint:  routine visit  History of Present Illness: Dakota Gutierrez is a 69 y.o. male with history of VT with ICD, HTN, DM, GERD, retinal art occlusion, OSA w/CPAP.  April 2017 had  nonsustained ventricular tachycardia and easily inducible to polymorphic ventricular tachycardia and received an ICD. Unfortunately, in the days that followed he developed VT storm with multiple shocks and was treated with amiodarone which has been gradually down titrated to 100 mg a day.  Amiodarone surveillance had demonstrated elevated liver function tests spring 2013 . Was elected to discontinue the amiodarone albeit with some reluctance on the part of the family. When seen in June of recurrent episodes of ventricular tachycardia 2 of which required therapy. He was then resumed on amiodarone and referred for consultation with GI  most recently 4/17 transaminases were normal".  He was seen by myself in October 2017, his last echo noted mild decrease in LV function and his lopressor changed to Toprol.  He reported feeling pretty well.  He denied any CP, states he gets a discomfort in his chest when he has over eaten, this is chronic and unchanged for years, no palpitations, no SOB.  He denies any dizziness, near syncope or syncope.  His device check that visit noted appropriate tx w/ATP for VT, his Toprol was increased and following w/GI for hx of abn LFTs/amiodarone  He saw Dr. 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  He had ATP x2 for VT, no med changes and planned for gen change.  He saw him Sep 2018, change no VT, concerns of incisional abcess, and rx a course of antibiotics with plans to f/u in 2 weeks.    07/20/2019 he had a monitored VT episode 140bpm, 3  minutes in duration (at least).  I saw him for this He was at the beach, shad not had any ETOH that day, would have been just going to bed, but not asleep at the time of the event.  He had no symptoms.  He denies any missed doses of any of his medicines. No medicine changes were made with plans to monitor closely via remotes and in clinic visits.  Dec 2020 he saw Dr. Caryl Comes, the device was reprogrammed to decrease the slope of his ADL rate.  He was volume OL,  started on lasix x5 doases then PRN, planned for updated echo. TTE noted LVEF  60-65%, undetermined diastolic function, no significant VHD  TODAY He is doing well.  His back and knee pain limit his ability exercise formally but is active through his day and routinely gets 8-9,000 steps in, reaching 10,000steps on occasion.  He has dieted and lost some weight (out back on a couple), but has gotten his HgbA1C from the 8's to 6.8. No CP, palpitations, or SOB, no DOE.  No near syncope or syncope, no shocks He has not felt like he has been retaining any fluid   Device history:  MDT dual chamber ICD, implanted 04/28/11,  gen change may 2018, Dr. Caryl Comes, primary prevention +++ hx of appropriate therapy AAD: amiodarone Hx of abn LFTs on amiodarone, was stopped and resumed 2/2 recurrent VT >> remains current   Past Medical History:  Diagnosis Date   Anxiety    secondary  to ICD shocks   Diabetes mellitus without complication ([place])    Dual implantable cardiac defibrillator in situ    Medtronic D314DRG Protecta   Elevated transaminase level    10/2011  ? amio   GERD (gastroesophageal reflux disease)    HTN (hypertension)    Other primary cardiomyopathies    Syncope    Tubular adenoma of colon    Ventricular tachycardia ([facility])    storm following ICD implant on amiodarone     Past Surgical History:  Procedure Laterality Date   CARDIAC DEFIBRILLATOR PLACEMENT     Medtronic D314DRG Protecta   CATARACT EXTRACTION, BILATERAL   2019   CHOLECYSTECTOMY     COLONOSCOPY WITH PROPOFOL N/A 06/30/2017   Procedure: COLONOSCOPY WITH PROPOFOL;  Surgeon: Carol Ada, MD;  Location: [facility];  Service: Endoscopy;  Laterality: N/A;   GALLBLADDER SURGERY     HERNIA REPAIR     ICD GENERATOR CHANGEOUT N/A 04/26/2017   Procedure: ICD Generator Changeout;  Surgeon: [name], MD;  Location: [facility];  Service: Cardiovascular;  Laterality: N/A;   TONSILLECTOMY AND ADENOIDECTOMY      Current Outpatient Medications  Medication Sig Dispense Refill   acetaminophen (TYLENOL) 500 MG tablet Take 1,000 mg by mouth daily as needed for moderate pain or headache.     amiodarone (PACERONE) 200 MG tablet Take 1/2 (one-half) tablet by mouth once daily 45 tablet 3   aspirin EC 81 MG tablet Take 1 tablet (81 mg total) by mouth daily.     atorvastatin (LIPITOR) 10 MG tablet Take 1 tablet (10 mg total) by mouth daily. 90 tablet 2   Cholecalciferol (VITAMIN D) 2000 UNITS tablet Take 2,000 Units by mouth daily.     diclofenac Sodium (VOLTAREN) 1 % GEL Apply 4 g topically 4 (four) times daily. Rub into affected area of foot 2 to 4 times daily 100 g 2   empagliflozin (JARDIANCE) 10 MG TABS tablet Take 1 tablet (10 mg total) by mouth daily before breakfast. 90 tablet 2   famotidine (PEPCID) 20 MG tablet Take 1 tablet (20 mg total) by mouth 2 (two) times daily as needed. 60 tablet 4   gabapentin (NEURONTIN) 100 MG capsule Take 1 capsule (100 mg total) by mouth at bedtime. 90 capsule 1   metFORMIN (GLUCOPHAGE) 500 MG tablet Take 1 tablet (500 mg total) by mouth daily with breakfast. 90 tablet 3   metoprolol succinate (TOPROL-XL) 100 MG 24 hr tablet TAKE 1 TABLET BY MOUTH ONCE DAILY(IMMEDIATLY FOLLOWING A MEAL) 90 tablet 3   olmesartan-hydrochlorothiazide (BENICAR HCT) 20-12.5 MG tablet Take 1 tablet by mouth daily. 90 tablet 2   Omega-3 Fatty Acids (FISH OIL) 1000 MG CAPS Take 1,000 mg by mouth 2 (two) times daily.       OVER THE COUNTER MEDICATION      tadalafil (CIALIS) 20 MG tablet Take 0.5-1 tablets (10-20 mg total) by mouth daily as needed for erectile dysfunction. 10 tablet 11   traMADol (ULTRAM) 50 MG tablet      No current facility-administered medications for this visit.    Allergies:   Adhesive [tape]   Social History:  The patient  reports that he has never smoked. He has never used smokeless tobacco. He reports current alcohol use of about 2.0 standard drinks of alcohol per week. He reports that he does not use drugs.   Family History:  The patient's family history includes Heart disease in his father; Hyperlipidemia in his mother.  ROS:  Please see the history of present illness.  All other systems are reviewed and otherwise negative.   PHYSICAL EXAM:  VS:  BP (!) 164/78    Pulse (!) 106    Ht 5\' 5"  (1.651 m)    Wt 189 lb (85.7 kg)    SpO2 97%    BMI 31.45 kg/m  BMI: Body mass index is 31.45 kg/m.  HR with his device check was AP 57's Well nourished, well developed, in no acute distress  HEENT: normocephalic, atraumatic  Neck: no JVD, carotid bruits or masses Cardiac: RRR; no significant murmurs, no rubs, or gallops Lungs:  CTA b/l, no wheezing, rhonchi or rales  Abd: soft, nontender MS: no deformity or atrophy Ext: no edema  Skin: warm and dry, no rash Neuro:  No gross deficits appreciated, known left eye visual field deficit  Psych: euthymic mood, full affect   ICD site is stable, no tethering or discomfort, skin is intact, no fluid collection, fluctuation   EKG:  Not done today  ICD interrogation today and reviewed by myself: Battery and lead measurements are good NSVT only (6) No AF Optivol well below threshold   12/11/2019: TTE IMPRESSIONS  1. The average left ventricular global longitudinal strain is -19.7 %.  2. Left ventricular ejection fraction, by visual estimation, is 60 to  65%. The left ventricle has normal function. There is no left ventricular   hypertrophy.  3. Left ventricular diastolic parameters are indeterminate.  4. The left ventricle has no regional wall motion abnormalities.  5. Global right ventricle has normal systolic function.The right  ventricular size is mildly enlarged. No increase in right ventricular wall  thickness.  6. Left atrial size was normal.  7. Right atrial size was normal.  8. Mild to moderate mitral annular calcification.  9. The mitral valve is normal in structure. Mild mitral valve  regurgitation. No evidence of mitral stenosis.  10. The tricuspid valve is normal in structure.  11. The aortic valve was not well visualized. Aortic valve regurgitation  is not visualized. Mild to moderate aortic valve sclerosis/calcification  without any evidence of aortic stenosis.  12. The pulmonic valve was normal in structure. Pulmonic valve  regurgitation is not visualized.  13. The inferior vena cava is normal in size with greater than 50%  respiratory variability, suggesting right atrial pressure of 3 mmHg.     04/07/16: TTE Study Conclusions - Left ventricle: The cavity size was mildly dilated. Wall   thickness was normal. Systolic function was mildly reduced. The   estimated ejection fraction was in the range of 45% to 50%.   Diffuse hypokinesis. Doppler parameters are consistent with   abnormal left ventricular relaxation (grade 1 diastolic   dysfunction). The E/e&' ratio is between 8-15, suggesting   indeterminate LV filling pressure. - Left atrium: The atrium was mildly dilated. - Right ventricle: The cavity size was normal. Pacer wire or   catheter noted in right ventricle. Systolic function is reduced. - Right atrium: The atrium was normal in size. Pacer wire or   catheter noted in right atrium. - Inferior vena cava: The vessel was normal in size. The   respirophasic diameter changes were in the normal range (>= 50%),   consistent with normal central venous pressure. - Pericardium,  extracardiac: A trivial pericardial effusion was   identified. Impressions: - LVEF 45-50%, diffuse hypokinesis, normal wall thickness with   dilated LV cavity, diastolic dysfunction, indeterminate LV   filling pressure, mildly dilated  LA, RV/RA AICD wires noted,   reduced RV systolic function, trivial pericardial effusion.  Myoview 2012-no ischemia ejection fraction 56%    Recent Labs: 11/26/2019: TSH 3.280 05/13/2020: ALT 18; BUN 15; Creatinine, Ser 0.98; Potassium 4.3; Sodium 140  05/13/2020: Chol/HDL Ratio 3.7; Cholesterol, Total 118; HDL 32; LDL Chol Calc (NIH) 54; Triglycerides 195   Estimated Creatinine Clearance: 69.6 mL/min (by C-G formula based on SCr of 0.98 mg/dL).   Wt Readings from Last 3 Encounters:  06/02/20 189 lb (85.7 kg)  05/13/20 189 lb (85.7 kg)  11/26/19 184 lb (83.5 kg)     Other studies reviewed: Additional studies/records reviewed today include: summarized above  ASSESSMENT AND PLAN:  1. VT, polymorphic w/ICD     On amiodarone,  labs up to date, no pulm symptoms     NSVT only   2. Mild NICM >> HFpEF     He has had recovery of his LVEF, remains burdened with volume OL     on BB/ARB/diuretic     No symptoms or exam findings to suggest fluid OL, OptiVol looks good         3. HTN        No changes made today, he reports home BPs generally 120's/70's-80's    Disposition: remotes Q 3 mo and in clinic in 71mo, sooner if needed.    Current medicines are reviewed at length with the patient today.  The patient did not have any concerns regarding medicines.  Haywood Lasso, PA-C 06/02/2020 9:33 AM     [address] [place]  [place] [address] [phone] (office)  [phone] (fax)

## 2020-06-02 ENCOUNTER — Other Ambulatory Visit: Payer: Self-pay

## 2020-06-02 ENCOUNTER — Ambulatory Visit: Payer: Medicare PPO | Admitting: Physician Assistant

## 2020-06-02 VITALS — BP 164/78 | HR 106 | Ht 65.0 in | Wt 189.0 lb

## 2020-06-02 DIAGNOSIS — I472 Ventricular tachycardia, unspecified: Secondary | ICD-10-CM

## 2020-06-02 DIAGNOSIS — I10 Essential (primary) hypertension: Secondary | ICD-10-CM | POA: Diagnosis not present

## 2020-06-02 DIAGNOSIS — Z95810 Presence of automatic (implantable) cardiac defibrillator: Secondary | ICD-10-CM

## 2020-06-02 NOTE — Patient Instructions (Signed)
Medication Instructions:   Your physician recommends that you continue on your current medications as directed. Please refer to the Current Medication list given to you today.  *If you need a refill on your cardiac medications before your next appointment, please call your pharmacy*   Lab Work: NONE ORDERED  TODAY   If you have labs (blood work) drawn today and your tests are completely normal, you will receive your results only by: . MyChart Message (if you have MyChart) OR . A paper copy in the mail If you have any lab test that is abnormal or we need to change your treatment, we will call you to review the results.   Testing/Procedures: NONE ORDERED  TODAY   Follow-Up: At [facility], you and your health needs are our priority.  As part of our continuing mission to provide you with exceptional heart care, we have created designated Provider Care Teams.  These Care Teams include your primary Cardiologist (physician) and Advanced Practice Providers (APPs -  Physician Assistants and Nurse Practitioners) who all work together to provide you with the care you need, when you need it.  We recommend signing up for the patient portal called "MyChart".  Sign up information is provided on this After Visit Summary.  MyChart is used to connect with patients for Virtual Visits (Telemedicine).  Patients are able to view lab/test results, encounter notes, upcoming appointments, etc.  Non-urgent messages can be sent to your provider as well.   To learn more about what you can do with MyChart, go to [email or web address].    Your next appointment:   6 month(s)  The format for your next appointment:   In Person  Provider:   You may see Dr. Klein  or one of the following Advanced Practice Providers on your designated Care Team:    Amber Seiler, NP  Renee Ursuy, PA-C  Michael "Andy" Tillery, PA-C    Other Instructions   

## 2020-06-23 ENCOUNTER — Ambulatory Visit: Payer: Medicare PPO | Admitting: Registered Nurse

## 2020-06-23 ENCOUNTER — Telehealth: Payer: Self-pay | Admitting: *Deleted

## 2020-06-23 VITALS — BP 130/70 | Ht 65.0 in | Wt 189.0 lb

## 2020-06-23 DIAGNOSIS — Z0000 Encounter for general adult medical examination without abnormal findings: Secondary | ICD-10-CM

## 2020-06-23 NOTE — Telephone Encounter (Signed)
No voicemail called for scheduled AWV

## 2020-06-23 NOTE — Progress Notes (Signed)
Presents today for [employer] Visit   Date of last exam: 05-13-2020  Interpreter used for this visit? No  I connected with  Dakota Gutierrez on 06/23/20 by a telephone and verified that I am speaking with the correct person using two identifiers.   I discussed the limitations of evaluation and management by telemedicine. The patient expressed understanding and agreed to proceed.  Patient location: home  Provider location: in office  I provided 20 minutes of non face - to - face time during this encounter.  Patient Care Team: Wendie Agreste, MD as PCP - General (Family Medicine) [name], MD (Cardiology) [name], MD as Attending Physician (Neurology)   Other items to address today:   Discussed Eye/Dental Discussed Immunizations Follow up Dr. Carlota Raspberry 9-16 @ 10:20   Other Screening: Last screening for diabetes: 05/13/2020 Last lipid screening: 05/13/2020    Immunization status:  Immunization History  Administered Date(s) Administered  . Fluad Quad(high Dose 65+) 08/14/2019  . Influenza, High Dose Seasonal PF 10/17/2018  . Influenza,inj,Quad PF,6+ Mos 09/19/2013, 10/28/2014, 08/17/2015, 09/15/2016, 10/12/2017  . Pneumococcal Conjugate-13 10/28/2014  . Pneumococcal Polysaccharide-23 12/22/2016  . Tdap 03/21/2013  . Zoster 12/23/2013     Health Maintenance Due  Topic Date Due  . COVID-19 Vaccine (1) Never done     Functional Status Survey: Is the patient deaf or have difficulty hearing?: No Does the patient have difficulty seeing, even when wearing glasses/contacts?: No Does the patient have difficulty concentrating, remembering, or making decisions?: No Does the patient have difficulty walking or climbing stairs?: Yes (some knee pain) Does the patient have difficulty dressing or bathing?: No Does the patient have difficulty doing errands alone such as visiting a doctor's office or shopping?: No   6CIT Screen 06/23/2020  12/27/2018 12/26/2017  What Year? 0 points 0 points 0 points  What month? 0 points 0 points 0 points  What time? 0 points 0 points 0 points  Count back from 20 0 points 0 points 0 points  Months in reverse 0 points 0 points 0 points  Repeat phrase 0 points 0 points 2 points  Total Score 0 0 2        Clinical Support from 06/23/2020 in [facility]  AUDIT-C Score 2       Home Environment:   One story home Having some knee pain taking stairs slow No grab bars No scattered rug Adequate lighting/ no clutter   Patient Active Problem List   Diagnosis Date Noted  . Hypertension associated with type 2 diabetes mellitus ([place]) 11/14/2019  . Type II diabetes mellitus ([place]) 01/16/2015  . Hypersomnolence 08/14/2012  . History of Helicobacter pylori infection 04/16/2012  . GERD (gastroesophageal reflux disease) 04/16/2012  . Elevated transaminase level 02/02/2012  . Obesity 12/29/2011  . 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 Syncope   . Ventricular tachycardia ([place])   . Anxiety      Past Medical History:  Diagnosis Date  . 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 Diabetes mellitus without complication ([place])   . Dual implantable cardiac defibrillator in situ    Medtronic D314DRG Protecta  . Elevated transaminase level    10/2011  ? amio  . GERD (gastroesophageal reflux disease)   . HTN (hypertension)   . Other primary cardiomyopathies   . Syncope   . Tubular adenoma of colon   . Ventricular tachycardia ([place])    storm following ICD  implant on amiodarone      Past Surgical History:  Procedure Laterality Date  . CARDIAC DEFIBRILLATOR PLACEMENT     Medtronic D314DRG Protecta  . CATARACT EXTRACTION, BILATERAL  2019  . CHOLECYSTECTOMY    . COLONOSCOPY WITH PROPOFOL N/A 06/30/2017   Procedure: COLONOSCOPY WITH PROPOFOL;  Surgeon: Carol Ada, MD;  Location: [facility];  Service: Endoscopy;  Laterality: N/A;  . GALLBLADDER SURGERY      . HERNIA REPAIR    . ICD GENERATOR CHANGEOUT N/A 04/26/2017   Procedure: ICD Generator Changeout;  Surgeon: [name], MD;  Location: [facility];  Service: Cardiovascular;  Laterality: N/A;  . TONSILLECTOMY AND ADENOIDECTOMY       Family History  Problem Relation Age of Onset  . Hyperlipidemia Mother   . Heart disease Father   . Colon cancer Neg Hx   . Esophageal cancer Neg Hx   . Stomach cancer Neg Hx   . Rectal cancer Neg Hx      Social History   Socioeconomic History  . Marital status: Married    Spouse name: Not on file  . Number of children: 0  . Years of education: Not on file  . Highest education level: Bachelor's degree (e.g., BA, AB, BS)  Occupational History  . Occupation: Retired Pharmacist, hospital  Tobacco Use  . Smoking status: Never Smoker  . Smokeless tobacco: Never Used  Vaping Use  . Vaping Use: Never used  Substance and Sexual Activity  . Alcohol use: Yes    Alcohol/week: 2.0 standard drinks    Types: 2 Cans of beer per week    Comment: 4/week  . Drug use: No  . Sexual activity: Yes  Other Topics Concern  . Not on file  Social History Narrative   Divorced. Education: [employer]. Exercise: walking 3 times a week for 30 minutes.   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 Difficulty of Paying Living Expenses:   Food Insecurity:   . Worried About Charity fundraiser in the Last Year:   . Arboriculturist in the Last Year:   Transportation Needs:   . Film/video editor (Medical):   Marland Kitchen Lack of Transportation (Non-Medical):   Physical Activity:   . Days of Exercise per Week:   . Minutes of Exercise per Session:   Stress:   . Feeling of Stress :   Social Connections:   . Frequency of Communication with Friends and Family:   . Frequency of Social Gatherings with Friends and Family:   . Attends Religious Services:   . Active Member of Clubs or Organizations:   . Attends Archivist Meetings:    Marland Kitchen Marital Status:   Intimate Partner Violence:   . Fear of Current or Ex-Partner:   . Emotionally Abused:   Marland Kitchen Physically Abused:   . Sexually Abused:      Allergies  Allergen Reactions  . Adhesive [Tape]     Swelling and welts formed     Prior to Admission medications   Medication Sig Start Date End Date Taking? Authorizing Provider  acetaminophen (TYLENOL) 500 MG tablet Take 1,000 mg by mouth daily as needed for moderate pain or headache.   Yes [provider]  amiodarone (PACERONE) 200 MG tablet Take 1/2 (one-half) tablet by mouth once daily 03/30/20  Yes [name], MD  aspirin EC 81 MG tablet Take 1 tablet (81 mg total) by mouth  daily. 11/11/11  Yes [name], MD  atorvastatin (LIPITOR) 10 MG tablet Take 1 tablet (10 mg total) by mouth daily. 05/13/20  Yes Wendie Agreste, MD  Cholecalciferol (VITAMIN D) 2000 UNITS tablet Take 2,000 Units by mouth daily.   Yes [provider]  diclofenac Sodium (VOLTAREN) 1 % GEL Apply 4 g topically 4 (four) times daily. Rub into affected area of foot 2 to 4 times daily 05/22/20  Yes McDonald, Stephan Minister, DPM  empagliflozin (JARDIANCE) 10 MG TABS tablet Take 1 tablet (10 mg total) by mouth daily before breakfast. 05/13/20  Yes Wendie Agreste, MD  famotidine (PEPCID) 20 MG tablet Take 1 tablet (20 mg total) by mouth 2 (two) times daily as needed. 04/16/12  Yes Pyrtle, Lajuan Lines, MD  gabapentin (NEURONTIN) 100 MG capsule Take 1 capsule (100 mg total) by mouth at bedtime. 05/13/20  Yes Wendie Agreste, MD  metFORMIN (GLUCOPHAGE) 500 MG tablet Take 1 tablet (500 mg total) by mouth daily with breakfast. 05/13/20  Yes Wendie Agreste, MD  metoprolol succinate (TOPROL-XL) 100 MG 24 hr tablet TAKE 1 TABLET BY MOUTH ONCE DAILY(IMMEDIATLY FOLLOWING A MEAL) 10/02/19  Yes [name], MD  olmesartan-hydrochlorothiazide (BENICAR HCT) 20-12.5 MG tablet Take 1 tablet by mouth daily. 05/13/20  Yes Wendie Agreste, MD  Omega-3 Fatty Acids  (FISH OIL) 1000 MG CAPS Take 1,000 mg by mouth 2 (two) times daily.    Yes [provider]  OVER THE COUNTER MEDICATION    Yes [provider]  tadalafil (CIALIS) 20 MG tablet Take 0.5-1 tablets (10-20 mg total) by mouth daily as needed for erectile dysfunction. 09/15/16  Yes Wendie Agreste, MD  traMADol Veatrice Bourbon) 50 MG tablet  01/08/20  Yes [provider]     Depression screen [facility] 2/9 06/23/2020 05/13/2020 11/14/2019 10/02/2019 08/14/2019  Decreased Interest 0 0 0 0 0  Down, Depressed, Hopeless 0 0 0 0 0  PHQ - 2 Score 0 0 0 0 0     Fall Risk  06/23/2020 05/13/2020 11/14/2019 10/02/2019 08/14/2019  Falls in the past year? 0 0 0 0 0  Number falls in past yr: 0 - - 0 0  Injury with Fall? 0 - - 0 0  Follow up Falls evaluation completed;Education provided Falls evaluation completed Falls evaluation completed Falls evaluation completed Falls evaluation completed      PHYSICAL EXAM: BP (!) 130/70 Comment: per patient taken at home  Ht 5\' 5"  (1.651 m)   Wt 189 lb (85.7 kg)   BMI 31.45 kg/m    Wt Readings from Last 3 Encounters:  06/23/20 189 lb (85.7 kg)  06/02/20 189 lb (85.7 kg)  05/13/20 189 lb (85.7 kg)       Education/Counseling provided regarding diet and exercise, prevention of chronic diseases, smoking/tobacco cessation, if applicable, and reviewed "Covered Medicare Preventive Services."

## 2020-06-23 NOTE — Patient Instructions (Signed)
Thank you for taking time to come for your Medicare Wellness Visit. I appreciate your ongoing commitment to your health goals. Please review the following plan we discussed and let me know if I can assist you in the future.  Dakota Kennedy LPN  Preventive Care 80 Years and Older, Male Preventive care refers to lifestyle choices and visits with your health care provider that can promote health and wellness. This includes:  A yearly physical exam. This is also called an annual well check.  Regular dental and eye exams.  Immunizations.  Screening for certain conditions.  Healthy lifestyle choices, such as diet and exercise. What can I expect for my preventive care visit? Physical exam Your health care provider will check:  Height and weight. These may be used to calculate body mass index (BMI), which is a measurement that tells if you are at a healthy weight.  Heart rate and blood pressure.  Your skin for abnormal spots. Counseling Your health care provider may ask you questions about:  Alcohol, tobacco, and drug use.  Emotional well-being.  Home and relationship well-being.  Sexual activity.  Eating habits.  History of falls.  Memory and ability to understand (cognition).  Work and work Statistician. What immunizations do I need?  Influenza (flu) vaccine  This is recommended every year. Tetanus, diphtheria, and pertussis (Tdap) vaccine  You may need a Td booster every 10 years. Varicella (chickenpox) vaccine  You may need this vaccine if you have not already been vaccinated. Zoster (shingles) vaccine  You may need this after age 54. Pneumococcal conjugate (PCV13) vaccine  One dose is recommended after age 28. Pneumococcal polysaccharide (PPSV23) vaccine  One dose is recommended after age 53. Measles, mumps, and rubella (MMR) vaccine  You may need at least one dose of MMR if you were born in 1957 or later. You may also need a second dose. Meningococcal  conjugate (MenACWY) vaccine  You may need this if you have certain conditions. Hepatitis A vaccine  You may need this if you have certain conditions or if you travel or work in places where you may be exposed to hepatitis A. Hepatitis B vaccine  You may need this if you have certain conditions or if you travel or work in places where you may be exposed to hepatitis B. Haemophilus influenzae type b (Hib) vaccine  You may need this if you have certain conditions. You may receive vaccines as individual doses or as more than one vaccine together in one shot (combination vaccines). Talk with your health care provider about the risks and benefits of combination vaccines. What tests do I need? Blood tests  Lipid and cholesterol levels. These may be checked every 5 years, or more frequently depending on your overall health.  Hepatitis C test.  Hepatitis B test. Screening  Lung cancer screening. You may have this screening every year starting at age 81 if you have a 30-pack-year history of smoking and currently smoke or have quit within the past 15 years.  Colorectal cancer screening. All adults should have this screening starting at age 73 and continuing until age 60. Your health care provider may recommend screening at age 35 if you are at increased risk. You will have tests every 1-10 years, depending on your results and the type of screening test.  Prostate cancer screening. Recommendations will vary depending on your family history and other risks.  Diabetes screening. This is done by checking your blood sugar (glucose) after you have not eaten for  a while (fasting). You may have this done every 1-3 years.  Abdominal aortic aneurysm (AAA) screening. You may need this if you are a current or former smoker.  Sexually transmitted disease (STD) testing. Follow these instructions at home: Eating and drinking  Eat a diet that includes fresh fruits and vegetables, whole grains, lean  protein, and low-fat dairy products. Limit your intake of foods with high amounts of sugar, saturated fats, and salt.  Take vitamin and mineral supplements as recommended by your health care provider.  Do not drink alcohol if your health care provider tells you not to drink.  If you drink alcohol: ? Limit how much you have to 0-2 drinks a day. ? Be aware of how much alcohol is in your drink. In the [place] one drink equals one 12 oz bottle of beer (355 mL), one 5 oz glass of wine (148 mL), or one 1 oz glass of hard liquor (44 mL). Lifestyle  Take daily care of your teeth and gums.  Stay active. Exercise for at least 30 minutes on 5 or more days each week.  Do not use any products that contain nicotine or tobacco, such as cigarettes, e-cigarettes, and chewing tobacco. If you need help quitting, ask your health care provider.  If you are sexually active, practice safe sex. Use a condom or other form of protection to prevent STIs (sexually transmitted infections).  Talk with your health care provider about taking a low-dose aspirin or statin. What's next?  Visit your health care provider once a year for a well check visit.  Ask your health care provider how often you should have your eyes and teeth checked.  Stay up to date on all vaccines. This information is not intended to replace advice given to you by your health care provider. Make sure you discuss any questions you have with your health care provider. Document Revised: 11/08/2018 Document Reviewed: 11/08/2018 Elsevier Patient Education  2020 Elsevier Inc.  

## 2020-06-25 ENCOUNTER — Encounter (INDEPENDENT_AMBULATORY_CARE_PROVIDER_SITE_OTHER): Payer: Medicare PPO | Admitting: Ophthalmology

## 2020-07-16 ENCOUNTER — Ambulatory Visit: Payer: Medicare PPO | Admitting: Podiatry

## 2020-08-13 ENCOUNTER — Ambulatory Visit: Payer: Medicare PPO | Admitting: Family Medicine

## 2020-08-25 ENCOUNTER — Ambulatory Visit: Payer: Medicare Other | Admitting: Adult Health

## 2020-09-09 ENCOUNTER — Ambulatory Visit (INDEPENDENT_AMBULATORY_CARE_PROVIDER_SITE_OTHER): Payer: Medicare PPO

## 2020-09-09 DIAGNOSIS — I472 Ventricular tachycardia, unspecified: Secondary | ICD-10-CM

## 2020-09-11 LAB — CUP PACEART REMOTE DEVICE CHECK
Battery Remaining Longevity: 55 mo
Battery Voltage: 2.97 V
Brady Statistic AP VP Percent: 13.77 %
Brady Statistic AP VS Percent: 76.28 %
Brady Statistic AS VP Percent: 0.02 %
Brady Statistic AS VS Percent: 9.93 %
Brady Statistic RA Percent Paced: 88.05 %
Brady Statistic RV Percent Paced: 13.44 %
Date Time Interrogation Session: 20211013043726
HighPow Impedance: 39 Ohm
HighPow Impedance: 45 Ohm
Implantable Lead Implant Date: 20120531
Implantable Lead Implant Date: 20120531
Implantable Lead Location: 753859
Implantable Lead Location: 753860
Implantable Lead Model: 4076
Implantable Lead Model: 6947
Implantable Pulse Generator Implant Date: 20180530
Lead Channel Impedance Value: 304 Ohm
Lead Channel Impedance Value: 399 Ohm
Lead Channel Impedance Value: 399 Ohm
Lead Channel Pacing Threshold Amplitude: 0.5 V
Lead Channel Pacing Threshold Amplitude: 0.625 V
Lead Channel Pacing Threshold Pulse Width: 0.4 ms
Lead Channel Pacing Threshold Pulse Width: 0.4 ms
Lead Channel Sensing Intrinsic Amplitude: 1.75 mV
Lead Channel Sensing Intrinsic Amplitude: 1.75 mV
Lead Channel Sensing Intrinsic Amplitude: 10.375 mV
Lead Channel Sensing Intrinsic Amplitude: 10.375 mV
Lead Channel Setting Pacing Amplitude: 1.5 V
Lead Channel Setting Pacing Amplitude: 2 V
Lead Channel Setting Pacing Pulse Width: 0.4 ms
Lead Channel Setting Sensing Sensitivity: 0.3 mV

## 2020-09-14 NOTE — Progress Notes (Signed)
Remote ICD transmission.   

## 2020-11-25 ENCOUNTER — Other Ambulatory Visit: Payer: Self-pay | Admitting: Family Medicine

## 2020-11-25 DIAGNOSIS — R208 Other disturbances of skin sensation: Secondary | ICD-10-CM

## 2020-11-25 NOTE — Telephone Encounter (Signed)
Requested Prescriptions  Pending Prescriptions Disp Refills  . gabapentin (NEURONTIN) 100 MG capsule [Pharmacy Med Name: GABAPENTIN 100 MG CAPSULE] 90 capsule 1    Sig: TAKE 1 CAPSULE BY MOUTH AT BEDTIME.     Neurology: Anticonvulsants - gabapentin Passed - 11/25/2020  1:16 PM      Passed - Valid encounter within last 12 months    Recent Outpatient Visits          6 months ago Burning sensation of foot   [facility], Asencion Partridge, MD   1 year ago Hypertension associated with type 2 diabetes mellitus [facility])   [facility] at [facility], Eilleen Kempf, MD   1 year ago Right knee pain, unspecified chronicity   [facility], Asencion Partridge, MD   1 year ago Type 2 diabetes mellitus with hyperglycemia, without long-term current use of insulin [facility])   [facility], Asencion Partridge, MD   1 year ago Type 2 diabetes mellitus with hyperglycemia, without long-term current use of insulin [facility])   [facility], Asencion Partridge, MD

## 2020-12-09 ENCOUNTER — Ambulatory Visit (INDEPENDENT_AMBULATORY_CARE_PROVIDER_SITE_OTHER): Payer: Medicare PPO

## 2020-12-09 DIAGNOSIS — I428 Other cardiomyopathies: Secondary | ICD-10-CM | POA: Diagnosis not present

## 2020-12-09 LAB — CUP PACEART REMOTE DEVICE CHECK
Battery Remaining Longevity: 50 mo
Battery Voltage: 2.96 V
Brady Statistic AP VP Percent: 11.49 %
Brady Statistic AP VS Percent: 73.71 %
Brady Statistic AS VP Percent: 0.02 %
Brady Statistic AS VS Percent: 14.78 %
Brady Statistic RA Percent Paced: 84.07 %
Brady Statistic RV Percent Paced: 11.24 %
Date Time Interrogation Session: 20220112052826
HighPow Impedance: 40 Ohm
HighPow Impedance: 50 Ohm
Implantable Lead Implant Date: 20120531
Implantable Lead Implant Date: 20120531
Implantable Lead Location: 753859
Implantable Lead Location: 753860
Implantable Lead Model: 4076
Implantable Lead Model: 6947
Implantable Pulse Generator Implant Date: 20180530
Lead Channel Impedance Value: 361 Ohm
Lead Channel Impedance Value: 418 Ohm
Lead Channel Impedance Value: 418 Ohm
Lead Channel Pacing Threshold Amplitude: 0.625 V
Lead Channel Pacing Threshold Amplitude: 0.625 V
Lead Channel Pacing Threshold Pulse Width: 0.4 ms
Lead Channel Pacing Threshold Pulse Width: 0.4 ms
Lead Channel Sensing Intrinsic Amplitude: 11.75 mV
Lead Channel Sensing Intrinsic Amplitude: 11.75 mV
Lead Channel Sensing Intrinsic Amplitude: 2.25 mV
Lead Channel Sensing Intrinsic Amplitude: 2.25 mV
Lead Channel Setting Pacing Amplitude: 1.5 V
Lead Channel Setting Pacing Amplitude: 2 V
Lead Channel Setting Pacing Pulse Width: 0.4 ms
Lead Channel Setting Sensing Sensitivity: 0.3 mV

## 2020-12-10 ENCOUNTER — Telehealth: Payer: Self-pay

## 2020-12-10 NOTE — Telephone Encounter (Signed)
error 

## 2020-12-10 NOTE — Telephone Encounter (Signed)
Scheduled remote received 12/09/20.  23 VHR events logged 1 second to 4 minutes 39 seconds w/ rates 130's to 160's bpm; EGM suggest NSVT, SVT (4 VT monitor zone, no therapies delivered). Patient reports during the times of events he had cold like symptoms. Denies any cardiac symptoms. States he was not tested for Covid and has since improved. Patient was not aware of elevated heart rates. Reports compliance with medications including Amiodarone 200 mg (1/2 tablet daily), Toprol - XL 100 mg daily. Advised patient to call with any new symptoms, questions or concerns. Has 6 month follow up with Dr. Caryl Comes 02/01/21. Shock plan reviewed with patient. Advised we will call with any changes. Verbalized understanding.

## 2020-12-18 NOTE — Telephone Encounter (Signed)
Leigh  good morning Look at episode # 182 and note that it ends with an A;  this would suggest that the mechanism is ??? Right--it should be VT. The Next early  A should had NO impact on the preceding A to go to the Ventricle if the driver was th atrium  Look at some of the other recordings and see how it that they end Thanks SK   We should get another transmission to make sure what ever was going is over

## 2020-12-21 NOTE — Telephone Encounter (Signed)
Calling patient to send manual transmission to assess for further events.  No answer, LMVOM.

## 2020-12-22 NOTE — Progress Notes (Signed)
Remote ICD transmission.   

## 2020-12-22 NOTE — Telephone Encounter (Signed)
Patient called back and I let patient know we need a transmission from his home monitor and patient states that he will send it and once we receive it a nurse will call him back

## 2020-12-25 ENCOUNTER — Other Ambulatory Visit: Payer: Self-pay | Admitting: Internal Medicine

## 2020-12-25 DIAGNOSIS — I10 Essential (primary) hypertension: Secondary | ICD-10-CM

## 2020-12-25 DIAGNOSIS — I472 Ventricular tachycardia, unspecified: Secondary | ICD-10-CM

## 2020-12-25 NOTE — Telephone Encounter (Signed)
Transmission received on 09/22/21. No new alerts noted.

## 2020-12-28 NOTE — Telephone Encounter (Signed)
L  we should bring him in to reprogram his device to include a Slow Vt therapy zone with a high NID Thanks SK

## 2020-12-30 NOTE — Telephone Encounter (Signed)
Patient called and advised Dr. Caryl Comes would like for him to come into device clinic for programming changes. Offered Thursday for apt. States he can do Friday. Apt made Friday 01/01/21 @ 2:00 PM. Location, date and time discussed with patient. Advised to call with further questions or concerns.

## 2020-12-30 NOTE — Telephone Encounter (Signed)
How slow you would like the VT detection to be set at (130 or 140)?   Also, NID set at (24 or 52)?  I will get him scheduled for device to make these changes.

## 2021-01-01 ENCOUNTER — Ambulatory Visit (INDEPENDENT_AMBULATORY_CARE_PROVIDER_SITE_OTHER): Payer: Medicare PPO | Admitting: Emergency Medicine

## 2021-01-01 ENCOUNTER — Other Ambulatory Visit: Payer: Self-pay

## 2021-01-01 DIAGNOSIS — Z95810 Presence of automatic (implantable) cardiac defibrillator: Secondary | ICD-10-CM | POA: Diagnosis not present

## 2021-01-01 DIAGNOSIS — I472 Ventricular tachycardia, unspecified: Secondary | ICD-10-CM

## 2021-01-01 LAB — CUP PACEART INCLINIC DEVICE CHECK
Battery Remaining Longevity: 48 mo
Battery Voltage: 2.97 V
Brady Statistic AP VP Percent: 12.52 %
Brady Statistic AP VS Percent: 75.19 %
Brady Statistic AS VP Percent: 0.02 %
Brady Statistic AS VS Percent: 12.27 %
Brady Statistic RA Percent Paced: 86.08 %
Brady Statistic RV Percent Paced: 12.22 %
Date Time Interrogation Session: 20220204143657
HighPow Impedance: 40 Ohm
HighPow Impedance: 50 Ohm
Implantable Lead Implant Date: 20120531
Implantable Lead Implant Date: 20120531
Implantable Lead Location: 753859
Implantable Lead Location: 753860
Implantable Lead Model: 4076
Implantable Lead Model: 6947
Implantable Pulse Generator Implant Date: 20180530
Lead Channel Impedance Value: 361 Ohm
Lead Channel Impedance Value: 418 Ohm
Lead Channel Impedance Value: 418 Ohm
Lead Channel Pacing Threshold Amplitude: 0.75 V
Lead Channel Pacing Threshold Amplitude: 0.75 V
Lead Channel Pacing Threshold Pulse Width: 0.4 ms
Lead Channel Pacing Threshold Pulse Width: 0.4 ms
Lead Channel Sensing Intrinsic Amplitude: 1.625 mV
Lead Channel Sensing Intrinsic Amplitude: 12.5 mV
Lead Channel Setting Pacing Amplitude: 1.5 V
Lead Channel Setting Pacing Amplitude: 2 V
Lead Channel Setting Pacing Pulse Width: 0.4 ms
Lead Channel Setting Sensing Sensitivity: 0.3 mV

## 2021-01-01 NOTE — Progress Notes (Signed)
ICD check in clinic. Normal device function. Thresholds and sensing consistent with previous device measurements. Impedance trends stable over time. No mode switches. 23 ventricular arrhythmias, 4 Vt episodes and 19 NSVT episodes. VT episodes falling below detection. Per Dr Caryl Comes VT monitor zone programmed to 120 bpm , VT detection programmed to 133 bpm with VT RX sequences at 6, upper trand lower tracking rate programmed to 115 bpm with ADFL rate of 110 bpm. Histogram distribution appropriate for patient and level of activity. Device programmed at appropriate safety margins. Device programmed to optimize intrinsic conduction. Estimated longevity 4 years. Pt enrolled in remote follow-up. Patient education completed including shock plan. Auditory/vibratory alert demonstrated.

## 2021-02-01 ENCOUNTER — Ambulatory Visit: Payer: Medicare PPO | Admitting: Internal Medicine

## 2021-02-01 ENCOUNTER — Other Ambulatory Visit: Payer: Self-pay

## 2021-02-01 ENCOUNTER — Encounter: Payer: Self-pay | Admitting: Internal Medicine

## 2021-02-01 VITALS — BP 112/62 | HR 82 | Ht 65.0 in | Wt 189.0 lb

## 2021-02-01 DIAGNOSIS — Z95810 Presence of automatic (implantable) cardiac defibrillator: Secondary | ICD-10-CM | POA: Diagnosis not present

## 2021-02-01 DIAGNOSIS — I472 Ventricular tachycardia, unspecified: Secondary | ICD-10-CM

## 2021-02-01 DIAGNOSIS — Z79899 Other long term (current) drug therapy: Secondary | ICD-10-CM | POA: Diagnosis not present

## 2021-02-01 NOTE — Progress Notes (Signed)
Thank you very much yet      Patient Care Team: Wendie Agreste, MD as PCP - General (Family Medicine) [name], MD (Cardiology) [name], MD as Attending Physician (Neurology)   HPI  Dakota SCHARA is a 70 y.o. 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 Unfortunately, in the days that followed he developed VT storm with multiple shocks and was treated with amiodarone which has been gradually down titrated to 100 mg a day.  Amiodarone surveillance had demonstrated elevated liver function tests spring 2013 . We elected to discontinue the amiodarone albeit with some reluctance on the part of the family. When seen in June of recurrent episodes of ventricular tachycardia 2 of which required therapy. He was then resumed on amiodarone and referred for consultation with GI  most recently 4/17 transaminases were normal.     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  His diet is salt avid and fluid added*.  There have been no palpitations, lightheadedness or syncope  Patient denies symptoms of GI intolerance, sun sensitivity, neurological symptoms attributable to amiodarone.     Had questions about the reprogramming that prompted the last visit-there was ventricular tachycardia below detection and so the detection rates were lowered   Date Cr K TSH  ALT   5/18   3.84 25    3/19   3.73 27  8/19   3.4 24  3/20   3.25   12/20 0.97 4.4  21  6/21 0.98 4.3  18   DATE TEST EF   2012 [facility] 56% No Ischemia  5/17 Echo   45-50 %            Past Medical History:  Diagnosis Date  . 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 Diabetes mellitus without complication ([place])   . Dual implantable cardiac  defibrillator in situ    Medtronic [id number] Protecta  . Elevated transaminase level    10/2011  ? amio  . GERD (gastroesophageal reflux disease)   . HTN (hypertension)   . Other primary cardiomyopathies   . Syncope   . Tubular adenoma of colon   . Ventricular tachycardia ([place])    storm following ICD implant on amiodarone     Past Surgical History:  Procedure Laterality Date  . CARDIAC DEFIBRILLATOR PLACEMENT     Medtronic [id number] Protecta  . CATARACT EXTRACTION, BILATERAL  2019  . CHOLECYSTECTOMY    . COLONOSCOPY WITH PROPOFOL N/A 06/30/2017   Procedure: COLONOSCOPY WITH PROPOFOL;  Surgeon: Carol Ada, MD;  Location: [facility];  Service: Endoscopy;  Laterality: N/A;  . GALLBLADDER SURGERY    . HERNIA REPAIR    . ICD GENERATOR CHANGEOUT N/A 04/26/2017   Procedure: ICD Generator Changeout;  Surgeon: [name], MD;  Location: [place] [facility];  Service: Cardiovascular;  Laterality: N/A;  . TONSILLECTOMY AND ADENOIDECTOMY      Current Outpatient Medications  Medication Sig Dispense Refill  . acetaminophen (TYLENOL) 500 MG tablet Take 1,000 mg by mouth daily as needed for moderate pain or headache.    Marland Kitchen amiodarone (PACERONE) 200 MG tablet Take 1/2 (one-half) tablet by mouth once daily 45 tablet 3  . aspirin EC 81 MG tablet Take  1 tablet (81 mg total) by mouth daily.    Marland Kitchen atorvastatin (LIPITOR) 10 MG tablet Take 1 tablet (10 mg total) by mouth daily. 90 tablet 2  . Cholecalciferol (VITAMIN D) 2000 UNITS tablet Take 2,000 Units by mouth daily.    . diclofenac Sodium (VOLTAREN) 1 % GEL Apply 4 g topically 4 (four) times daily. Rub into affected area of foot 2 to 4 times daily 100 g 2  . empagliflozin (JARDIANCE) 10 MG TABS tablet Take 1 tablet (10 mg total) by mouth daily before breakfast. 90 tablet 2  . famotidine (PEPCID) 20 MG tablet Take 1 tablet (20 mg total) by mouth 2 (two) times daily as needed. 60 tablet 4  . gabapentin (NEURONTIN) 100 MG capsule TAKE 1 CAPSULE BY  MOUTH AT BEDTIME. 90 capsule 1  . metFORMIN (GLUCOPHAGE) 500 MG tablet Take 1 tablet (500 mg total) by mouth daily with breakfast. 90 tablet 3  . metoprolol succinate (TOPROL-XL) 100 MG 24 hr tablet TAKE 1 TABLET BY MOUTH ONCE DAILY(IMMEDIATLY FOLLOWING A MEAL) 90 tablet 1  . olmesartan-hydrochlorothiazide (BENICAR HCT) 20-12.5 MG tablet Take 1 tablet by mouth daily. 90 tablet 2  . Omega-3 Fatty Acids (FISH OIL) 1000 MG CAPS Take 1,000 mg by mouth 2 (two) times daily.    Marland Kitchen OVER THE COUNTER MEDICATION     . tadalafil (CIALIS) 20 MG tablet Take 0.5-1 tablets (10-20 mg total) by mouth daily as needed for erectile dysfunction. 10 tablet 11  . traMADol (ULTRAM) 50 MG tablet      No current facility-administered medications for this visit.    Allergies  Allergen Reactions  . Adhesive [Tape]     Swelling and welts formed    Review of Systems negative except from HPI and PMH except for complaints of back pain and muscle pain  Physical Exam BP 112/62   Pulse 82   Ht 5\' 5"  (1.651 m)   Wt 189 lb (85.7 kg)   SpO2 94%   BMI 31.45 kg/m  Well developed and well nourished in no acute distress HENT normal Neck supple with JVP-flat Clear Device pocket well healed; without hematoma or erythema.  There is no tethering  Regular rate and rhythm, no murmur Abd-soft with active BS No Clubbing cyanosis  edema Skin-warm and dry A & Oriented  Grossly normal sensory and motor function  ECG sinus at 82 Intervals 20/11/38  Assessment and  Plan  VT    ICD Medtronic  The patient's device was interrogated and the information was fully reviewed.  The device was reprogrammed to decrease the slope of his ADL rate  Sinus noded dysfunction   Amiodarone rx   HFpEF acute/chronic    Still with vigorous heart rate excursion.  Have increased his threshold from medium-low to medium-high.  Exercise>>  No interval ventricular tachycardia  Volume overloaded.  Does not want to take another diuretic.   Discussed again salt avoidance and decrease fluid intake  Tolerating amiodarone.  Needs surveillance laboratories none

## 2021-02-01 NOTE — Patient Instructions (Signed)
Medication Instructions:  Your physician recommends that you continue on your current medications as directed. Please refer to the Current Medication list given to you today.  *If you need a refill on your cardiac medications before your next appointment, please call your pharmacy*   Lab Work: Liver panel and TSH today  If you have labs (blood work) drawn today and your tests are completely normal, you will receive your results only by: Marland Kitchen MyChart Message (if you have MyChart) OR . A paper copy in the mail If you have any lab test that is abnormal or we need to change your treatment, we will call you to review the results.   Testing/Procedures: None ordered.    Follow-Up: At [facility], you and your health needs are our priority.  As part of our continuing mission to provide you with exceptional heart care, we have created designated Provider Care Teams.  These Care Teams include your primary Cardiologist (physician) and Advanced Practice Providers (APPs -  Physician Assistants and Nurse Practitioners) who all work together to provide you with the care you need, when you need it.  We recommend signing up for the patient portal called "MyChart".  Sign up information is provided on this After Visit Summary.  MyChart is used to connect with patients for Virtual Visits (Telemedicine).  Patients are able to view lab/test results, encounter notes, upcoming appointments, etc.  Non-urgent messages can be sent to your provider as well.   To learn more about what you can do with MyChart, go to [email or web address].    Your next appointment:   6 month(s)  The format for your next appointment:   In Person  Provider:   Virl Axe, MD

## 2021-02-02 LAB — HEPATIC FUNCTION PANEL
ALT: 24 IU/L (ref 0–44)
AST: 31 IU/L (ref 0–40)
Albumin: 4.7 g/dL (ref 3.7–4.7)
Alkaline Phosphatase: 48 IU/L (ref 44–121)
Bilirubin Total: 0.5 mg/dL (ref 0.0–1.2)
Bilirubin, Direct: 0.17 mg/dL (ref 0.00–0.40)
Total Protein: 7 g/dL (ref 6.0–8.5)

## 2021-02-02 LAB — TSH: TSH: 3.67 u[IU]/mL (ref 0.450–4.500)

## 2021-02-15 ENCOUNTER — Other Ambulatory Visit: Payer: Self-pay

## 2021-02-15 ENCOUNTER — Encounter (INDEPENDENT_AMBULATORY_CARE_PROVIDER_SITE_OTHER): Payer: Self-pay | Admitting: Ophthalmology

## 2021-02-15 ENCOUNTER — Ambulatory Visit (INDEPENDENT_AMBULATORY_CARE_PROVIDER_SITE_OTHER): Payer: Medicare PPO | Admitting: Ophthalmology

## 2021-02-15 DIAGNOSIS — H43822 Vitreomacular adhesion, left eye: Secondary | ICD-10-CM

## 2021-02-15 DIAGNOSIS — H43821 Vitreomacular adhesion, right eye: Secondary | ICD-10-CM | POA: Diagnosis not present

## 2021-02-15 DIAGNOSIS — H47012 Ischemic optic neuropathy, left eye: Secondary | ICD-10-CM | POA: Insufficient documentation

## 2021-02-15 DIAGNOSIS — H4720 Unspecified optic atrophy: Secondary | ICD-10-CM | POA: Diagnosis not present

## 2021-02-15 DIAGNOSIS — H43813 Vitreous degeneration, bilateral: Secondary | ICD-10-CM | POA: Diagnosis not present

## 2021-02-15 HISTORY — DX: Vitreomacular adhesion, right eye: H43.821

## 2021-02-15 HISTORY — DX: Vitreomacular adhesion, left eye: H43.822

## 2021-02-15 NOTE — Assessment & Plan Note (Signed)
No signs of progression.  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

## 2021-02-15 NOTE — Assessment & Plan Note (Signed)

## 2021-02-15 NOTE — Progress Notes (Signed)
02/15/2021     CHIEF COMPLAINT Patient presents for Retina Follow Up (2 Year Diabetic follow up. OCT and FP/Pt states he has a blind spot in OS. Pt states it is no better or worse. Pt states he has had a stye on OS. /BGL: did not check/A1C: 6.8 in June)   HISTORY OF PRESENT ILLNESS: Dakota Gutierrez is a 70 y.o. 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  Severity is moderate.  Duration of 2 years.  I, the attending physician,  performed the HPI with the patient and updated documentation appropriately. Additional comments: 2 Year Diabetic follow up. OCT and FP Pt states he has a blind spot in OS. Pt states it is no better or worse. Pt states he has had a stye on OS.  BGL: did not check A1C: 6.8 in June       Last edited by Tilda Franco on 02/15/2021  3:10 PM. (History)      Referring physician: Wendie Agreste, MD [address] [employer],  [place] [address]  HISTORICAL INFORMATION:   Selected notes from the MEDICAL RECORD NUMBER    Lab Results  Component Value Date   HGBA1C 6.8 (H) 05/13/2020     CURRENT MEDICATIONS: No current outpatient medications on file. (Ophthalmic Drugs)   No current facility-administered medications for this visit. (Ophthalmic Drugs)   Current Outpatient Medications (Other)  Medication Sig  . acetaminophen (TYLENOL) 500 MG tablet Take 1,000 mg by mouth daily as needed for moderate pain or headache.  Marland Kitchen amiodarone (PACERONE) 200 MG tablet Take 1/2 (one-half) tablet by mouth once daily  . aspirin EC 81 MG tablet Take 1 tablet (81 mg total) by mouth daily.  Marland Kitchen atorvastatin (LIPITOR) 10 MG tablet Take 1 tablet (10 mg total) by mouth daily.  . Cholecalciferol (VITAMIN D) 2000 UNITS tablet Take 2,000 Units by mouth daily.  . diclofenac Sodium (VOLTAREN) 1 % GEL Apply 4 g topically 4 (four) times daily. Rub into affected area of foot 2 to 4 times daily  . empagliflozin (JARDIANCE) 10 MG  TABS tablet Take 1 tablet (10 mg total) by mouth daily before breakfast.  . famotidine (PEPCID) 20 MG tablet Take 1 tablet (20 mg total) by mouth 2 (two) times daily as needed.  . gabapentin (NEURONTIN) 100 MG capsule TAKE 1 CAPSULE BY MOUTH AT BEDTIME.  . metFORMIN (GLUCOPHAGE) 500 MG tablet Take 1 tablet (500 mg total) by mouth daily with breakfast.  . metoprolol succinate (TOPROL-XL) 100 MG 24 hr tablet TAKE 1 TABLET BY MOUTH ONCE DAILY(IMMEDIATLY FOLLOWING A MEAL)  . olmesartan-hydrochlorothiazide (BENICAR HCT) 20-12.5 MG tablet Take 1 tablet by mouth daily.  . Omega-3 Fatty Acids (FISH OIL) 1000 MG CAPS Take 1,000 mg by mouth 2 (two) times daily.  Marland Kitchen OVER THE COUNTER MEDICATION   . tadalafil (CIALIS) 20 MG tablet Take 0.5-1 tablets (10-20 mg total) by mouth daily as needed for erectile dysfunction.  . traMADol (ULTRAM) 50 MG tablet    No current facility-administered medications for this visit. (Other)      REVIEW OF SYSTEMS: ROS    Positive for: Endocrine   Last edited by Tilda Franco on 02/15/2021  3:10 PM. (History)       ALLERGIES Allergies  Allergen Reactions  . Adhesive [Tape]     Swelling and welts formed    PAST MEDICAL HISTORY Past  Medical History:  Diagnosis Date  . 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 Diabetes mellitus without complication ([place])   . Dual implantable cardiac defibrillator in situ    Medtronic [id number] Protecta  . Elevated transaminase level    10/2011  ? amio  . GERD (gastroesophageal reflux disease)   . HTN (hypertension)   . Other primary cardiomyopathies   . Syncope   . Tubular adenoma of colon   . Ventricular tachycardia ([place])    storm following ICD implant on amiodarone   . Vitreomacular adhesion of 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  . Vitreomacular adhesion of right eye 02/15/2021   Past Surgical History:  Procedure Laterality Date  . CARDIAC DEFIBRILLATOR PLACEMENT     Medtronic D314DRG Protecta  . CATARACT EXTRACTION, BILATERAL  2019   . CHOLECYSTECTOMY    . COLONOSCOPY WITH PROPOFOL N/A 06/30/2017   Procedure: COLONOSCOPY WITH PROPOFOL;  Surgeon: Carol Ada, MD;  Location: [facility];  Service: Endoscopy;  Laterality: N/A;  . GALLBLADDER SURGERY    . HERNIA REPAIR    . ICD GENERATOR CHANGEOUT N/A 04/26/2017   Procedure: ICD Generator Changeout;  Surgeon: [name], MD;  Location: [facility];  Service: Cardiovascular;  Laterality: N/A;  . TONSILLECTOMY AND ADENOIDECTOMY      FAMILY HISTORY Family History  Problem Relation Age of Onset  . Hyperlipidemia Mother   . Heart disease Father   . Colon cancer Neg Hx   . Esophageal cancer Neg Hx   . Stomach cancer Neg Hx   . Rectal cancer Neg Hx     SOCIAL HISTORY Social History   Tobacco Use  . Smoking status: Never Smoker  . Smokeless tobacco: Never Used  Vaping Use  . Vaping Use: Never used  Substance Use Topics  . Alcohol use: Yes    Alcohol/week: 2.0 standard drinks    Types: 2 Cans of beer per week    Comment: 4/week  . Drug use: No         OPHTHALMIC EXAM:  Base Eye Exam    Visual Acuity (Snellen - Linear)      Right Left   Dist [place] 20/25 -2 20/50 -4   Dist ph [place]  NI  OS reads last letter only       Tonometry (Tonopen, 3:16 PM)      Right Left   Pressure 15 16       Pupils      Pupils Dark Light Shape React APD   Right PERRL 4 3 Round Brisk None   Left PERRL 4 3 Round Brisk None       Visual Fields (Counting fingers)      Left Right     Full   Restrictions Partial inner superior temporal, inferior temporal, superior nasal, inferior nasal deficiencies        Neuro/Psych    Oriented x3: Yes   Mood/Affect: Normal       Dilation    Both eyes: 1.0% Mydriacyl, 2.5% Phenylephrine @ 3:16 PM        Slit Lamp and Fundus Exam    External Exam      Right Left   External Normal Normal       Slit Lamp Exam      Right Left   Lids/Lashes Normal Normal   Conjunctiva/Sclera White and quiet White and quiet   Cornea  Clear Clear   Anterior Chamber Deep and quiet Deep and quiet   Iris  Round and reactive Round and reactive   Anterior Vitreous Normal Normal       Fundus Exam      Right Left   Posterior Vitreous Posterior vitreous detachment Posterior vitreous detachment   Disc Normal 2+ Optic disc atrophy, 1+ Pallor   C/D Ratio 0.15 0.75, temporal optic atrophy.   Macula Normal Hard drusen   Vessels Normal Normal   Periphery Normal Normal          IMAGING AND PROCEDURES  Imaging and Procedures for 02/15/21  OCT, Retina - OU - Both Eyes       Right Eye Quality was good. Scan locations included subfoveal. Central Foveal Thickness: 296. Progression has been stable. Findings include normal foveal contour.   Left Eye Quality was good. Scan locations included subfoveal. Central Foveal Thickness: 263. Progression has been stable. Findings include abnormal foveal contour, inner retinal atrophy.   Notes Incidental posterior vitreous detachment OU  OS with diffuse inner retinal atrophy of RNFL atrophy of Previous AI ON       Color Fundus Photography Optos - OU - Both Eyes       Right Eye Progression has been stable. Disc findings include normal observations. Macula : normal observations. Vessels : normal observations. Periphery : normal observations.   Left Eye Progression has been stable. Disc findings include increased cup to disc ratio, pallor. Macula : normal observations. Vessels : normal observations. Periphery : normal observations.   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  An informational brochure was given to the patient.  All the patient's questions were answered.  The patient was asked to return if new or different flashes or floaters develops.   Patient was instructed to contact office immediately if any changes were noticed. I explained to the patient that vitreous inside the eye is similar to jello inside a bowl. As the jello melts it can start to pull away from the bowl, similarly the vitreous throughout our lives can begin to pull away from the retina. That process is called a posterior vitreous detachment. In some cases, the vitreous can tug hard enough on the retina to form a retinal tear. I discussed with the patient the signs and symptoms of a retinal detachment.  Do not rub the eye.      ICD-10-CM   1. 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 Optic atrophy, left eye  H47.20 OCT, Retina - OU - Both Eyes  3. Vitreomacular adhesion of right eye  H43.821   4. Posterior vitreous detachment, both eyes  H43.813 OCT, Retina - OU - Both Eyes    1.  2.  3.  Ophthalmic Meds Ordered this visit:  No orders of the defined types were placed in this encounter.      Return in about 1 year (around 02/15/2022) for DILATE OU, COLOR FP, OCT.  There  are no Patient Instructions on file for this visit.   Explained the diagnoses, plan, and follow up with the patient and they expressed understanding.  Patient expressed understanding of the importance of proper follow up care.   Clent Demark Pegeen Stiger M.D. Diseases & Surgery of the Retina and Vitreous Retina & Diabetic [place] 02/15/21     Abbreviations: M myopia (nearsighted); A astigmatism; H hyperopia (farsighted); P presbyopia; Mrx spectacle prescription;  CTL contact lenses; OD right eye; OS left  eye; OU both eyes  XT exotropia; ET esotropia; PEK punctate epithelial keratitis; PEE punctate epithelial erosions; DES dry eye syndrome; MGD meibomian gland dysfunction; ATs artificial tears; PFAT's preservative free artificial tears; [place] nuclear sclerotic cataract; PSC posterior subcapsular cataract; ERM epi-retinal membrane; PVD posterior vitreous detachment; RD retinal detachment; DM diabetes mellitus; DR diabetic retinopathy; NPDR non-proliferative diabetic retinopathy; PDR proliferative diabetic retinopathy; CSME clinically significant macular edema; DME diabetic macular edema; dbh dot blot hemorrhages; CWS cotton wool spot; POAG primary open angle glaucoma; C/D cup-to-disc ratio; HVF humphrey visual field; GVF goldmann visual field; OCT optical coherence tomography; IOP intraocular pressure; BRVO Branch retinal vein occlusion; CRVO central retinal vein occlusion; CRAO central retinal artery occlusion; BRAO branch retinal artery occlusion; RT retinal tear; SB scleral buckle; PPV pars plana vitrectomy; VH Vitreous hemorrhage; PRP panretinal laser photocoagulation; IVK intravitreal kenalog; VMT vitreomacular traction; MH Macular hole;  NVD neovascularization of the disc; NVE neovascularization elsewhere; AREDS age related eye disease study; ARMD age related macular degeneration; POAG primary open angle glaucoma; EBMD epithelial/anterior basement membrane dystrophy; ACIOL anterior chamber intraocular lens; IOL intraocular lens; PCIOL posterior chamber intraocular lens; Phaco/IOL phacoemulsification with intraocular lens placement; [place] photorefractive keratectomy; LASIK laser assisted in situ keratomileusis; HTN hypertension; DM diabetes mellitus; COPD chronic obstructive pulmonary disease

## 2021-02-19 ENCOUNTER — Other Ambulatory Visit: Payer: Self-pay | Admitting: Family Medicine

## 2021-02-19 DIAGNOSIS — E1165 Type 2 diabetes mellitus with hyperglycemia: Secondary | ICD-10-CM

## 2021-02-19 NOTE — Telephone Encounter (Signed)
Requested medications are due for refill today yes  Requested medications are on the active medication list yes  Last refill 11/19/20  Last visit 04/2020  Future visit scheduled No, was to return in 3 months but was given 9 month rx and never returned, no upcoming visit scheduled  Notes to clinic Failed protocol due to no valid visit within 6  months, no upcoming visit scheduled.

## 2021-03-10 ENCOUNTER — Ambulatory Visit (INDEPENDENT_AMBULATORY_CARE_PROVIDER_SITE_OTHER): Payer: Medicare PPO

## 2021-03-10 DIAGNOSIS — I472 Ventricular tachycardia, unspecified: Secondary | ICD-10-CM

## 2021-03-11 LAB — CUP PACEART REMOTE DEVICE CHECK
Battery Remaining Longevity: 46 mo
Battery Voltage: 2.97 V
Brady Statistic AP VP Percent: 3.67 %
Brady Statistic AP VS Percent: 70.3 %
Brady Statistic AS VP Percent: 0.03 %
Brady Statistic AS VS Percent: 26.01 %
Brady Statistic RA Percent Paced: 73.66 %
Brady Statistic RV Percent Paced: 3.61 %
Date Time Interrogation Session: 20220413044226
HighPow Impedance: 40 Ohm
HighPow Impedance: 47 Ohm
Implantable Lead Implant Date: 20120531
Implantable Lead Implant Date: 20120531
Implantable Lead Location: 753859
Implantable Lead Location: 753860
Implantable Lead Model: 4076
Implantable Lead Model: 6947
Implantable Pulse Generator Implant Date: 20180530
Lead Channel Impedance Value: 342 Ohm
Lead Channel Impedance Value: 399 Ohm
Lead Channel Impedance Value: 418 Ohm
Lead Channel Pacing Threshold Amplitude: 0.625 V
Lead Channel Pacing Threshold Amplitude: 0.625 V
Lead Channel Pacing Threshold Pulse Width: 0.4 ms
Lead Channel Pacing Threshold Pulse Width: 0.4 ms
Lead Channel Sensing Intrinsic Amplitude: 1.625 mV
Lead Channel Sensing Intrinsic Amplitude: 1.625 mV
Lead Channel Sensing Intrinsic Amplitude: 11.375 mV
Lead Channel Sensing Intrinsic Amplitude: 11.375 mV
Lead Channel Setting Pacing Amplitude: 1.5 V
Lead Channel Setting Pacing Amplitude: 2 V
Lead Channel Setting Pacing Pulse Width: 0.4 ms
Lead Channel Setting Sensing Sensitivity: 0.3 mV

## 2021-03-25 NOTE — Progress Notes (Signed)
Remote ICD transmission.   

## 2021-03-27 ENCOUNTER — Other Ambulatory Visit: Payer: Self-pay | Admitting: Internal Medicine

## 2021-04-05 ENCOUNTER — Other Ambulatory Visit: Payer: Medicare PPO

## 2021-04-05 ENCOUNTER — Ambulatory Visit: Payer: Medicare PPO | Admitting: Family Medicine

## 2021-04-05 ENCOUNTER — Ambulatory Visit (INDEPENDENT_AMBULATORY_CARE_PROVIDER_SITE_OTHER): Payer: Medicare PPO

## 2021-04-05 ENCOUNTER — Encounter: Payer: Self-pay | Admitting: Family Medicine

## 2021-04-05 ENCOUNTER — Other Ambulatory Visit: Payer: Self-pay

## 2021-04-05 VITALS — BP 122/68 | HR 78 | Temp 98.4°F | Resp 16 | Ht 65.0 in | Wt 186.8 lb

## 2021-04-05 DIAGNOSIS — E785 Hyperlipidemia, unspecified: Secondary | ICD-10-CM

## 2021-04-05 DIAGNOSIS — M545 Low back pain, unspecified: Secondary | ICD-10-CM

## 2021-04-05 DIAGNOSIS — E1165 Type 2 diabetes mellitus with hyperglycemia: Secondary | ICD-10-CM

## 2021-04-05 DIAGNOSIS — I10 Essential (primary) hypertension: Secondary | ICD-10-CM

## 2021-04-05 DIAGNOSIS — R208 Other disturbances of skin sensation: Secondary | ICD-10-CM

## 2021-04-05 DIAGNOSIS — R350 Frequency of micturition: Secondary | ICD-10-CM | POA: Diagnosis not present

## 2021-04-05 LAB — COMPREHENSIVE METABOLIC PANEL
ALT: 23 U/L (ref 0–53)
AST: 29 U/L (ref 0–37)
Albumin: 4.6 g/dL (ref 3.5–5.2)
Alkaline Phosphatase: 43 U/L (ref 39–117)
BUN: 13 mg/dL (ref 6–23)
CO2: 28 mEq/L (ref 19–32)
Calcium: 9.6 mg/dL (ref 8.4–10.5)
Chloride: 101 mEq/L (ref 96–112)
Creatinine, Ser: 0.98 mg/dL (ref 0.40–1.50)
GFR: 77.28 mL/min (ref >60.00)
Glucose, Bld: 148 mg/dL — ABNORMAL HIGH (ref 70–99)
Potassium: 4.2 mEq/L (ref 3.5–5.1)
Sodium: 139 mEq/L (ref 135–145)
Total Bilirubin: 0.8 mg/dL (ref 0.2–1.2)
Total Protein: 7 g/dL (ref 6.0–8.3)

## 2021-04-05 LAB — POCT URINALYSIS DIP (MANUAL ENTRY)
Blood, UA: NEGATIVE
Glucose, UA: >=1000 mg/dL — AB
Nitrite, UA: NEGATIVE
Protein Ur, POC: NEGATIVE mg/dL
Spec Grav, UA: 1.015 (ref 1.010–1.025)
Urobilinogen, UA: NEGATIVE E.U./dL — AB
pH, UA: 6 (ref 5.0–8.0)

## 2021-04-05 LAB — LIPID PANEL
Cholesterol: 129 mg/dL (ref 0–200)
HDL: 31.7 mg/dL — ABNORMAL LOW (ref >39.00)
Total CHOL/HDL Ratio: 4
Triglycerides: 402 mg/dL — ABNORMAL HIGH (ref 0.0–149.0)

## 2021-04-05 LAB — HEMOGLOBIN A1C: Hgb A1c MFr Bld: 7.8 % — ABNORMAL HIGH (ref 4.6–6.5)

## 2021-04-05 LAB — LDL CHOLESTEROL, DIRECT: Direct LDL: 54 mg/dL

## 2021-04-05 LAB — PSA: PSA: 0.05 ng/mL — ABNORMAL LOW (ref 0.10–4.00)

## 2021-04-05 MED ORDER — EMPAGLIFLOZIN 10 MG PO TABS: 10.0000 mg | ORAL_TABLET | Freq: Every day | ORAL | 2 refills | Status: DC

## 2021-04-05 MED ORDER — GABAPENTIN 100 MG PO CAPS: 100.0000 mg | ORAL_CAPSULE | Freq: Every day | ORAL | 1 refills | Status: DC

## 2021-04-05 MED ORDER — METFORMIN HCL 500 MG PO TABS: 500.0000 mg | ORAL_TABLET | Freq: Every day | ORAL | 3 refills | Status: DC

## 2021-04-05 MED ORDER — OLMESARTAN MEDOXOMIL-HCTZ 20-12.5 MG PO TABS: 1.0000 | ORAL_TABLET | Freq: Every day | ORAL | 2 refills | Status: DC

## 2021-04-05 MED ORDER — ATORVASTATIN CALCIUM 10 MG PO TABS: 10.0000 mg | ORAL_TABLET | Freq: Every day | ORAL | 2 refills | Status: DC

## 2021-04-05 NOTE — Progress Notes (Signed)
Subjective:  Patient ID: Dakota Gutierrez, male    DOB: 1951-10-28  Age: 70 y.o. MRN: [id number]  CC:  Chief Complaint  Patient presents with  . Flank Pain    Pt reports pain in kidney is still present, dull today, pt reports it does come and go more prominent prior to bowel movement and subsides or lessens once he has had the BM   . Diabetes    Pt reports has been trying to do die but last 2 months has not done so, and due to knee and back pain he is walking less. Pt does still do yard work but notes he is less active now than previously   . Follow-up    Pt does report a history of enlarged prostate from several years ago, no PSA within 5 years pt has requested this be checked today due to History.     HPI Dakota Gutierrez presents for   Flank pain Comes and goes, past 3 months. improves after having bowel movement.  Notices some increased discomfort prior to bowel movement. Left low back.  No hematuria. No dysuria. Occasional frequency.  Hx of low back issues, chiropractic treatment twice. No relief.  no leg radiation.  No bowel or bladder incontinence, no saddle anesthesia, no lower extremity weakness.  No blood in stool. No wt loss, fever, or night sweats.  Tx: none.  Colonoscopy 06/2017 - repeat 10 years. lipoma noted.   PSA screening Requests updated PSA test.  History of enlarged prostate - 20 years ago? Some increased frequency last year.  Nocturia once per night.  Lab Results  Component Value Date   PSA 0.09 10/28/2014   PSA 0.09 09/19/2013   The natural history of prostate cancer and ongoing controversy regarding screening and potential treatment outcomes of prostate cancer has been discussed with the patient. The meaning of a false positive PSA and a false negative PSA has been discussed. He indicates understanding of the limitations of this screening test and wishes to proceed with screening PSA testing.   Diabetes: Complicated by hyperglycemia yardwork for  exercise. Some less activity with knee issues (followed by specialist Dr. Stann Mainland). Doing ok. Had 2 shots. Those have helped. No plan for surgery at this time.  He is on ARB, statin. Treated with metformin 500 mg twice daily in the past, but some bloating.  Wilder Glade was not covered.  Once daily dosing of metformin.  Recommended starting Jardiance at his June 2021 visit. Doing ok on daily dosing of metformin.  Also started on gabapentin for possible peripheral neuropathy at his June 2021 visit. Working better - no new side effects.  Microalbumin: Normal ratio in June 2021 Optho, foot exam pneumovax: Up-to-date  Lab Results  Component Value Date   HGBA1C 6.8 (H) 05/13/2020   HGBA1C 7.0 (A) 11/14/2019   HGBA1C 7.8 (H) 08/14/2019   Lab Results  Component Value Date   MICROALBUR 0.7 09/15/2016   LDLCALC 54 05/13/2020   CREATININE 0.98 05/13/2020     History Patient Active Problem List   Diagnosis Date Noted  . 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 Posterior vitreous detachment, both eyes 02/15/2021  . Optic atrophy, 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  . Hypertension associated with type 2 diabetes mellitus ([place]) 11/14/2019  . Type II diabetes mellitus ([place]) 01/16/2015  . Hypersomnolence 08/14/2012  . History of Helicobacter pylori infection 04/16/2012  . GERD (gastroesophageal reflux disease) 04/16/2012  . Elevated transaminase level 02/02/2012  .  Obesity 12/29/2011  . 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 Syncope   . Ventricular tachycardia ([place])   . Anxiety    Past Medical History:  Diagnosis Date  . 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 Diabetes mellitus without complication ([place])   . Dual implantable cardiac defibrillator in situ    Medtronic [id number] Protecta  . Elevated transaminase level    10/2011  ? amio  . GERD (gastroesophageal reflux disease)   . HTN (hypertension)   . Other primary cardiomyopathies   .  Syncope   . Tubular adenoma of colon   . Ventricular tachycardia ([place])    storm following ICD implant on amiodarone   . Vitreomacular adhesion of 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  . Vitreomacular adhesion of right eye 02/15/2021   Past Surgical History:  Procedure Laterality Date  . CARDIAC DEFIBRILLATOR PLACEMENT     Medtronic [id number] Protecta  . CATARACT EXTRACTION, BILATERAL  2019  . CHOLECYSTECTOMY    . COLONOSCOPY WITH PROPOFOL N/A 06/30/2017   Procedure: COLONOSCOPY WITH PROPOFOL;  Surgeon: Carol Ada, MD;  Location: [facility];  Service: Endoscopy;  Laterality: N/A;  . GALLBLADDER SURGERY    . HERNIA REPAIR    . ICD GENERATOR CHANGEOUT N/A 04/26/2017   Procedure: ICD Generator Changeout;  Surgeon: [name], MD;  Location: [facility];  Service: Cardiovascular;  Laterality: N/A;  . TONSILLECTOMY AND ADENOIDECTOMY     Allergies  Allergen Reactions  . Adhesive [Tape]     Swelling and welts formed   Prior to Admission medications   Medication Sig Start Date End Date Taking? Authorizing Provider  acetaminophen (TYLENOL) 500 MG tablet Take 1,000 mg by mouth daily as needed for moderate pain or headache.   Yes [provider]  amiodarone (PACERONE) 200 MG tablet Take 1/2 (one-half) tablet by mouth once daily 03/29/21  Yes [name], MD  aspirin EC 81 MG tablet Take 1 tablet (81 mg total) by mouth daily. 11/11/11  Yes [name], MD  atorvastatin (LIPITOR) 10 MG tablet Take 1 tablet (10 mg total) by mouth daily. 05/13/20  Yes Wendie Agreste, MD  Cholecalciferol (VITAMIN D) 2000 UNITS tablet Take 2,000 Units by mouth daily.   Yes [provider]  diclofenac Sodium (VOLTAREN) 1 % GEL Apply 4 g topically 4 (four) times daily. Rub into affected area of foot 2 to 4 times daily 05/22/20  Yes McDonald, Stephan Minister, DPM  famotidine (PEPCID) 20 MG tablet Take 1 tablet (20 mg total) by mouth 2 (two) times daily as needed. 04/16/12  Yes Pyrtle, Lajuan Lines, MD  gabapentin  (NEURONTIN) 100 MG capsule TAKE 1 CAPSULE BY MOUTH AT BEDTIME. 11/25/20  Yes Wendie Agreste, MD  JARDIANCE 10 MG TABS tablet TAKE 1 TABLET (10 MG TOTAL) BY MOUTH DAILY BEFORE BREAKFAST. 02/19/21  Yes Wendie Agreste, MD  metFORMIN (GLUCOPHAGE) 500 MG tablet Take 1 tablet (500 mg total) by mouth daily with breakfast. 05/13/20  Yes Wendie Agreste, MD  metoprolol succinate (TOPROL-XL) 100 MG 24 hr tablet TAKE 1 TABLET BY MOUTH ONCE DAILY(IMMEDIATLY FOLLOWING A MEAL) 12/25/20  Yes [name], MD  olmesartan-hydrochlorothiazide (BENICAR HCT) 20-12.5 MG tablet Take 1 tablet by mouth daily. 05/13/20  Yes Wendie Agreste, MD  Omega-3 Fatty Acids (FISH OIL) 1000 MG CAPS Take 1,000 mg by mouth 2 (two) times daily.   Yes [provider]  OVER THE COUNTER MEDICATION    Yes  [provider]  tadalafil (CIALIS) 20 MG tablet Take 0.5-1 tablets (10-20 mg total) by mouth daily as needed for erectile dysfunction. 09/15/16  Yes Wendie Agreste, MD  traMADol Veatrice Bourbon) 50 MG tablet  01/08/20  Yes [provider]   Social History   Socioeconomic History  . Marital status: Married    Spouse name: Not on file  . Number of children: 0  . Years of education: Not on file  . Highest education level: Bachelor's degree (e.g., BA, AB, BS)  Occupational History  . Occupation: Retired Pharmacist, hospital  Tobacco Use  . Smoking status: Never Smoker  . Smokeless tobacco: Never Used  Vaping Use  . Vaping Use: Never used  Substance and Sexual Activity  . Alcohol use: Yes    Alcohol/week: 2.0 standard drinks    Types: 2 Cans of beer per week    Comment: 4/week  . Drug use: No  . Sexual activity: Yes  Other Topics Concern  . Not on file  Social History Narrative   Divorced. Education: [employer]. Exercise: walking 3 times a week for 30 minutes.   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  Eyes: Negative for visual disturbance.  Respiratory: Negative for cough, chest tightness and shortness of breath.   Cardiovascular: Negative for chest pain, palpitations and leg swelling.  Gastrointestinal: Negative for abdominal pain and blood in stool.  Neurological: Negative for dizziness, light-headedness and headaches.     Objective:   Vitals:   04/05/21 1115  BP: 122/68  Pulse: 78  Resp: 16  Temp: 98.4 F (36.9 C)  TempSrc: Temporal  SpO2: 97%  Weight: 186 lb 12.8 oz (84.7 kg)  Height: 5\' 5"  (1.651 m)     Physical Exam Vitals reviewed.  Constitutional:      Appearance: He is well-developed.  HENT:     Head: Normocephalic and atraumatic.  Eyes:     Pupils: Pupils are equal, round, and reactive to light.  Neck:     Vascular: No carotid bruit or JVD.  Cardiovascular:     Rate and Rhythm: Normal rate and regular rhythm.     Heart sounds: Normal heart sounds. No murmur heard.   Pulmonary:     Effort: Pulmonary effort is normal.     Breath sounds: Normal breath sounds. No rales.  Musculoskeletal:     Comments: Left lower paraspinal muscles area of discomfort, no midline bony tenderness.  No CVA tenderness.  Intact range of motion of lumbar spine, negative seated straight leg raise, ambulates without difficulty  Skin:    General: Skin is warm and dry.  Neurological:     Mental Status: He is alert and oriented to person, place, and time.     Results for orders placed or performed in visit on 04/05/21  POCT urinalysis dipstick  Result Value Ref Range   Color, UA yellow yellow   Clarity, UA clear clear   Glucose, UA >=1,000 (A) negative mg/dL   Bilirubin, UA moderate (A) negative   Ketones, POC UA moderate (40) (A) negative mg/dL    Spec Grav, UA 1.015 1.010 - 1.025   Blood, UA negative  negative   pH, UA 6.0 5.0 - 8.0   Protein Ur, POC negative negative mg/dL   Urobilinogen, UA negative (A) 0.2 or 1.0 E.U./dL   Nitrite, UA Negative Negative   Leukocytes, UA Small (1+) (A) Negative      Assessment & Plan:  Dakota Gutierrez is a 70 y.o. male . Left-sided low back pain without sciatica, unspecified chronicity - Plan: DG Lumbar Spine Complete  -Suspected lumbar source/paraspinals.  Check lumbar spine imaging, follow-up to discuss further steps.  Symptomatic care, range of motion for now, but minimal benefit with chiropractic treatment.  Check PSA, urine testing as above as well.  RTC precautions given.  Hyperlipidemia, unspecified hyperlipidemia type - Plan: atorvastatin (LIPITOR) 10 MG tablet, Lipid panel  -Check labs, tolerating current statin dosing.  Continue same.  Type 2 diabetes mellitus with hyperglycemia, without long-term current use of insulin (HCC) - Plan: Hemoglobin A1c, Comprehensive metabolic panel, atorvastatin (LIPITOR) 10 MG tablet, metFORMIN (GLUCOPHAGE) 500 MG tablet, Lipid panel, empagliflozin (JARDIANCE) 10 MG TABS tablet  -Tolerating once daily dosing metformin, Jardiance.  Option of higher dosing, check A1c, labs above  Essential hypertension - Plan: olmesartan-hydrochlorothiazide (BENICAR HCT) 20-12.5 MG tablet  -Stable on current regimen, continue same.  Continue routine follow-up with cardiology.  Urinary frequency - Plan: PSA, POCT urinalysis dipstick  -Check PSA, urinalysis with few WBC but no definitive signs of infection.  Longstanding frequency.  Follow-up next few weeks, RTC precautions if acutely worse.  Consider urology eval.  Burning sensation of foot - Plan: gabapentin (NEURONTIN) 100 MG capsule  -Improved with gabapentin, suspected peripheral neuropathy with diabetes.  Continue same.  Meds ordered this encounter  Medications  . atorvastatin (LIPITOR) 10 MG tablet    Sig:  Take 1 tablet (10 mg total) by mouth daily.    Dispense:  90 tablet    Refill:  2  . metFORMIN (GLUCOPHAGE) 500 MG tablet    Sig: Take 1 tablet (500 mg total) by mouth daily with breakfast.    Dispense:  90 tablet    Refill:  3  . olmesartan-hydrochlorothiazide (BENICAR HCT) 20-12.5 MG tablet    Sig: Take 1 tablet by mouth daily.    Dispense:  90 tablet    Refill:  2  . gabapentin (NEURONTIN) 100 MG capsule    Sig: Take 1 capsule (100 mg total) by mouth at bedtime.    Dispense:  90 capsule    Refill:  1    DX Code: Burning sensation in foot: R20.8  . empagliflozin (JARDIANCE) 10 MG TABS tablet    Sig: Take 1 tablet (10 mg total) by mouth daily before breakfast.    Dispense:  90 tablet    Refill:  2   There are no Patient Instructions on file for this visit.    Signed, [name], MD [facility]

## 2021-04-10 ENCOUNTER — Telehealth: Payer: Self-pay | Admitting: Family Medicine

## 2021-04-10 MED ORDER — MOLNUPIRAVIR 200 MG PO CAPS: 800.0000 mg | ORAL_CAPSULE | Freq: Two times a day (BID) | ORAL | 0 refills | Status: DC

## 2021-04-10 NOTE — Telephone Encounter (Signed)
On call note: Pt with + covid test today. Onset of URI/cough 1 d/a.  No SOB, fever, or CP. Has DM, hx v-tach with icd. High risk of complications. Paxlovid contraindicated with pt on amiodarone. I eRx'd molnupiravir after speaking with the patient. Signed:  Crissie Sickles, MD           04/10/2021

## 2021-04-21 ENCOUNTER — Ambulatory Visit: Payer: Medicare PPO | Admitting: Family Medicine

## 2021-05-14 ENCOUNTER — Ambulatory Visit: Payer: Medicare PPO | Admitting: Family Medicine

## 2021-06-02 ENCOUNTER — Encounter: Payer: Medicare PPO | Admitting: Family Medicine

## 2021-06-04 ENCOUNTER — Other Ambulatory Visit: Payer: Self-pay

## 2021-06-04 ENCOUNTER — Encounter: Payer: Self-pay | Admitting: Family Medicine

## 2021-06-04 ENCOUNTER — Ambulatory Visit: Payer: Medicare PPO | Admitting: Family Medicine

## 2021-06-04 VITALS — BP 130/72 | HR 76 | Temp 98.0°F | Resp 16 | Ht 65.0 in | Wt 186.2 lb

## 2021-06-04 DIAGNOSIS — E1165 Type 2 diabetes mellitus with hyperglycemia: Secondary | ICD-10-CM

## 2021-06-04 DIAGNOSIS — E785 Hyperlipidemia, unspecified: Secondary | ICD-10-CM

## 2021-06-04 DIAGNOSIS — E781 Pure hyperglyceridemia: Secondary | ICD-10-CM

## 2021-06-04 DIAGNOSIS — M545 Low back pain, unspecified: Secondary | ICD-10-CM

## 2021-06-04 MED ORDER — EMPAGLIFLOZIN 25 MG PO TABS: 25.0000 mg | ORAL_TABLET | Freq: Every day | ORAL | 1 refills | Status: DC

## 2021-06-04 NOTE — Patient Instructions (Addendum)
I am glad to hear that the back is improving. Continue the exercises at home, tylenol if needed and if pain worsens I can refer you back to orthopaedics.   Improved diet and diabetes control should help triglycerides. Recheck in 6 weeks with lab visit. Can decide on adding additional med at that time. Review notes from diabetes classes, but If you would like to meet with a nutritionist let me know.   Try higher dose of Jardiance (25mg ) per day and diet changes should help blood sugar.   Shingles vaccine can be given at your pharmacy. Shingrix.    High Triglycerides Eating Plan Triglycerides are a type of fat in the blood. High levels of triglycerides can increase your risk of heart disease and stroke. If your triglyceride levels are high, choosing the right foods can help lower your triglycerides and keep your heart healthy. Work with your health care provider or a diet and nutrition specialist (dietitian) to develop an eating plan that is right for you. What are tips for following this plan? General guidelines  Lose weight, if you are overweight. For most people, losing 5-10 lbs (2-5 kg) helps lower triglyceride levels. A weight-loss plan may include. 30 minutes of exercise at least 5 days a week. Reducing the amount of calories, sugar, and fat you eat. Eat a wide variety of fresh fruits, vegetables, and whole grains. These foods are high in fiber. Eat foods that contain healthy fats, such as fatty fish, nuts, seeds, and olive oil. Avoid foods that are high in added sugar, added salt (sodium), saturated fat, and trans fat. Avoid low-fiber, refined carbohydrates such as white bread, crackers, noodles, and white rice. Avoid foods with partially hydrogenated oils (trans fats), such as fried foods or stick margarine. Limit alcohol intake to no more than 1 drink a day for nonpregnant women and 2 drinks a day for men. One drink equals 12 oz of beer, 5 oz of wine, or 1 oz of hard liquor. Your health  care provider may recommend that you drink less depending on your overall health.  Reading food labels Check food labels for the amount of saturated fat. Choose foods with no or very little saturated fat. Check food labels for the amount of trans fat. Choose foods with no trans fat. Check food labels for the amount of cholesterol. Choose foods low in cholesterol. Ask your dietitian how much cholesterol you should have each day. Check food labels for the amount of sodium. Choose foods with less than 140 milligrams (mg) per serving. Shopping Buy dairy products labeled as nonfat (skim) or low-fat (1%). Avoid buying processed or prepackaged foods. These are often high in added sugar, sodium, and fat. Cooking Choose healthy fats when cooking, such as olive oil or canola oil. Cook foods using lower fat methods, such as baking, broiling, boiling, or grilling. Make your own sauces, dressings, and marinades when possible, instead of buying them. Store-bought sauces, dressings, and marinades are often high in sodium and sugar. Meal planning Eat more home-cooked food and less restaurant, buffet, and fast food. Eat fatty fish at least 2 times each week. Examples of fatty fish include salmon, trout, mackerel, tuna, and herring. If you eat whole eggs, do not eat more than 3 egg yolks per week. What foods are recommended? The items listed may not be a complete list. Talk with your dietitian aboutwhat dietary choices are best for you. Grains Whole wheat or whole grain breads, crackers, cereals, and pasta. Unsweetenedoatmeal. Bulgur. Barley. Quinoa.  Brown rice. Whole wheat flour tortillas. Vegetables Fresh or frozen vegetables. Low-sodium canned vegetables. Fruits All fresh, canned (in natural juice), or frozen fruits. Meats and other protein foods Skinless chicken or [place]. Ground chicken or [place]. Lean cuts of pork, trimmed of fat. Fish and seafood, especially salmon, trout, and herring. Egg whites.  Dried beans, peas, or lentils. Unsalted nuts or seeds. Unsalted cannedbeans. Natural peanut or almond butter. Dairy Low-fat dairy products. Skim or low-fat (1%) milk. Reduced fat (2%) and low-sodium cheese. Low-fat ricotta cheese. Low-fat cottage cheese. Plain,low-fat yogurt. Fats and oils Tub margarine without trans fats. Light or reduced-fat mayonnaise. Light or reduced-fat salad dressings.Avocado. Safflower, olive, sunflower, soybean, and canola oils. What foods are not recommended? The items listed may not be a complete list. Talk with your dietitian aboutwhat dietary choices are best for you. Grains White bread. White (regular) pasta. White rice. Cornbread. Bagels. Pastries. Crackers that contain trans fat. Vegetables Creamed or fried vegetables. Vegetables in a cheese sauce. Fruits Sweetened dried fruit. Canned fruit in syrup. Fruit juice. Meats and other protein foods Fatty cuts of meat. Ribs. Chicken wings. Berniece Salines. Sausage. Bologna. Salami.Chitterlings. Fatback. Hot dogs. Bratwurst. Packaged lunch meats. Dairy Whole or reduced-fat (2%) milk. Half-and-half. Cream cheese. Full-fat or sweetened yogurt. Full-fat cheese. Nondairy creamers. Whipped toppings.Processed cheese or cheese spreads. Cheese curds. Beverages Alcohol. Sweetened drinks, such as soda, lemonade, fruit drinks, or punches. Fats and oils Butter. Stick margarine. Lard. Shortening. Ghee. Bacon fat. Tropical oils, suchas coconut, palm kernel, or palm oils. Sweets and desserts Corn syrup. Sugars. Honey. Molasses. Candy. Jam and jelly. Syrup. Sweetenedcereals. Cookies. Pies. Cakes. Donuts. Muffins. Ice cream. Condiments Store-bought sauces, dressings, and marinades that are high in sugar, such asketchup and barbecue sauce. Summary High levels of triglycerides can increase the risk of heart disease and stroke. Choosing the right foods can help lower your triglycerides. Eat plenty of fresh fruits, vegetables, and whole grains.  Choose low-fat dairy and lean meats. Eat fatty fish at least twice a week. Avoid processed and prepackaged foods with added sugar, sodium, saturated fat, and trans fat. If you need suggestions or have questions about what types of food are good for you, talk with your health care provider or a dietitian. This information is not intended to replace advice given to you by your health care provider. Make sure you discuss any questions you have with your healthcare provider. Document Revised: 03/16/2020 Document Reviewed: 03/18/2020 Elsevier Patient Education  2022 [employer].

## 2021-06-04 NOTE — Progress Notes (Signed)
Subjective:  Patient ID: Dakota Gutierrez, male    DOB: [birth year]  Age: 70 y.o. MRN: [id number]  CC:  Chief Complaint  Patient presents with   Back Pain    Pt reports doing better as back pain is not as severe. Pt would like to go over lab work from last office visit as well.     HPI [name] presents for   Left-sided low back pain Discussed at May 9th visit.  Suspected lumbar source/paraspinals.  Previously treated by chiropractor.  Initial symptomatic care discussed with range of motion, Tylenol, handout given.  Imaging obtained: Lumbar spine May 9th: FINDINGS: There is no fracture or malalignment. Mild-to-moderate multilevel loss of disc space height and endplate spurring appear worst at L4-5. Facet degenerative change is seen at L5-S1. Paraspinous structures demonstrate aortic atherosclerosis.  IMPRESSION: Mild-to-moderate appearing multilevel degenerative disease. No acute abnormality.   Since last visit his pain is not as severe.  Doing better with exercises at physical therapy from past year when seen by ortho prior.  No bowel or bladder incontinence, no saddle anesthesia, no lower extremity weakness. No leg radiation.  Not needing tylenol regularly.     Hyperlipidemia: Significant hypertriglyceridemia with triglycerides 402 at his May 9 visit.  A1c was increased at 7.8.  He does take Lipitor 10 mg daily. On fish oil as well.  Fasting at last visit.  Some dietary indiscretion.   Lab Results  Component Value Date   CHOL 129 04/05/2021   HDL 31.70 (L) 04/05/2021   LDLCALC 54 05/13/2020   LDLDIRECT 54.0 04/05/2021   TRIG (H) 04/05/2021    402.0 Triglyceride is over 400; calculations on Lipids are invalid.   CHOLHDL 4 04/05/2021   Lab Results  Component Value Date   ALT 23 04/05/2021   AST 29 04/05/2021   ALKPHOS 43 04/05/2021   BILITOT 0.8 04/05/2021   Diabetes: With hyperglycemia, worsening control at his May 9 visit from 6.8-7.8 from June  2021.  Treated previously with metformin 500 mg twice daily, but reloading at twice daily dosing, was able to tolerate daily dosing, and on Jardiance 10 mg at last visit.  Had been on consistent dosing of meds at May visit.   Wt Readings from Last 3 Encounters:  06/04/21 186 lb 3.2 oz (84.5 kg)  04/05/21 186 lb 12.8 oz (84.7 kg)  02/01/21 189 lb (85.7 kg)    Lab Results  Component Value Date   HGBA1C 7.8 (H) 04/05/2021   HGBA1C 6.8 (H) 05/13/2020   HGBA1C 7.0 (A) 11/14/2019   Lab Results  Component Value Date   MICROALBUR 0.7 09/15/2016   LDLCALC 54 05/13/2020   CREATININE 0.98 04/05/2021   Immunization History  Administered Date(s) Administered   Fluad Quad(high Dose 65+) 08/14/2019   Influenza, High Dose Seasonal PF 10/17/2018   Influenza,inj,Quad PF,6+ Mos 09/19/2013, 10/28/2014, 08/17/2015, 09/15/2016, 10/12/2017   Moderna Sars-Covid-2 Vaccination 01/15/2020   PFIZER(Purple Top)SARS-COV-2 Vaccination 10/07/2020   Pneumococcal Conjugate-13 10/28/2014   Pneumococcal Polysaccharide-23 12/22/2016   Tdap 03/21/2013   Zoster, Live 12/23/2013      History Patient Active Problem List   Diagnosis Date Noted   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   Posterior vitreous detachment, both eyes 02/15/2021   Optic atrophy, 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   Hypertension associated with type 2 diabetes mellitus ([place]) 11/14/2019   Type II diabetes mellitus ([place]) 01/16/2015   Hypersomnolence 23/76/2831   History of Helicobacter pylori infection 04/16/2012  GERD (gastroesophageal reflux disease) 04/16/2012   Elevated transaminase level 02/02/2012   Obesity 12/29/2011   Dual implantable cardioverter-defibrillator - Medtronic 09/06/2011   Syncope    Ventricular tachycardia (HCC)    Anxiety    Past Medical History:  Diagnosis Date   Anxiety    secondary to ICD shocks   Diabetes mellitus without complication ([place])    Dual implantable cardiac defibrillator  in situ    Medtronic [id number] Protecta   Elevated transaminase level    10/2011  ? 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   Past Surgical History:  Procedure Laterality Date   CARDIAC DEFIBRILLATOR PLACEMENT     Medtronic [id number] Protecta   CATARACT EXTRACTION, BILATERAL  2019   CHOLECYSTECTOMY     COLONOSCOPY WITH PROPOFOL N/A 06/30/2017   Procedure: COLONOSCOPY WITH PROPOFOL;  Surgeon: Carol Ada, MD;  Location: [facility];  Service: Endoscopy;  Laterality: N/A;   GALLBLADDER SURGERY     HERNIA REPAIR     ICD GENERATOR CHANGEOUT N/A 04/26/2017   Procedure: ICD Generator Changeout;  Surgeon: [name], MD;  Location: [facility];  Service: Cardiovascular;  Laterality: N/A;   TONSILLECTOMY AND ADENOIDECTOMY     Allergies  Allergen Reactions   Adhesive [Tape]     Swelling and welts formed   Prior to Admission medications   Medication Sig Start Date End Date Taking? Authorizing Provider  acetaminophen (TYLENOL) 500 MG tablet Take 1,000 mg by mouth daily as needed for moderate pain or headache.   Yes [provider]  amiodarone (PACERONE) 200 MG tablet Take 1/2 (one-half) tablet by mouth once daily 03/29/21  Yes [name], MD  aspirin EC 81 MG tablet Take 1 tablet (81 mg total) by mouth daily. 11/11/11  Yes [name], MD  atorvastatin (LIPITOR) 10 MG tablet Take 1 tablet (10 mg total) by mouth daily. 04/05/21  Yes Wendie Agreste, MD  Cholecalciferol (VITAMIN D) 2000 UNITS tablet Take 2,000 Units by mouth daily.   Yes [provider]  diclofenac Sodium (VOLTAREN) 1 % GEL Apply 4 g topically 4 (four) times daily. Rub into affected area of foot 2 to 4 times daily  05/22/20  Yes McDonald, Stephan Minister, DPM  empagliflozin (JARDIANCE) 10 MG TABS tablet Take 1 tablet (10 mg total) by mouth daily before breakfast. 04/05/21  Yes Wendie Agreste, MD  famotidine (PEPCID) 20 MG tablet Take 1 tablet (20 mg total) by mouth 2 (two) times daily as needed. 04/16/12  Yes Pyrtle, Lajuan Lines, MD  gabapentin (NEURONTIN) 100 MG capsule Take 1 capsule (100 mg total) by mouth at bedtime. 04/05/21  Yes Wendie Agreste, MD  metFORMIN (GLUCOPHAGE) 500 MG tablet Take 1 tablet (500 mg total) by mouth daily with breakfast. 04/05/21  Yes Wendie Agreste, MD  metoprolol succinate (TOPROL-XL) 100 MG 24 hr tablet TAKE 1 TABLET BY MOUTH ONCE DAILY(IMMEDIATLY FOLLOWING A MEAL) 12/25/20  Yes [name], MD  Molnupiravir 200 MG CAPS Take 4 capsules (800 mg total) by mouth in the morning and at bedtime. 04/10/21  Yes McGowen, Adrian Blackwater, MD  olmesartan-hydrochlorothiazide (BENICAR HCT) 20-12.5 MG tablet Take 1 tablet  by mouth daily. 04/05/21  Yes Wendie Agreste, MD  Omega-3 Fatty Acids (FISH OIL) 1000 MG CAPS Take 1,000 mg by mouth 2 (two) times daily.   Yes [provider]  OVER THE COUNTER MEDICATION    Yes [provider]  tadalafil (CIALIS) 20 MG tablet Take 0.5-1 tablets (10-20 mg total) by mouth daily as needed for erectile dysfunction. 09/15/16  Yes Wendie Agreste, MD  traMADol Veatrice Bourbon) 50 MG tablet  01/08/20  Yes [provider]   Social History   Socioeconomic History   Marital status: Married    Spouse name: Not on file   Number of children: 0   Years of education: Not on file   Highest education level: Bachelor's degree (e.g., BA, AB, BS)  Occupational History   Occupation: Retired Pharmacist, hospital  Tobacco Use   Smoking status: Never   Smokeless tobacco: Never  Vaping Use   Vaping Use: Never used  Substance and Sexual Activity   Alcohol use: Yes    Alcohol/week: 2.0 standard drinks    Types: 2 Cans of beer per week    Comment: 4/week   Drug use: No   Sexual  activity: Yes  Other Topics Concern   Not on file  Social History Narrative   Divorced. Education: [employer]. Exercise: walking 3 times a week for 30 minutes.   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   Objective:   Vitals:   06/04/21 1130  BP: 130/72  Pulse: 76  Resp: 16  Temp: 98 F (36.7 C)  TempSrc: Temporal  SpO2: 98%  Weight: 186 lb 3.2 oz (84.5 kg)  Height: 5\' 5"  (1.651 m)     Physical Exam Vitals reviewed.  Constitutional:      Appearance: He is well-developed.     Comments: Overweight.   HENT:     Head: Normocephalic and atraumatic.  Neck:     Vascular: No carotid bruit or JVD.  Cardiovascular:     Rate and Rhythm: Normal rate and regular rhythm.     Heart sounds: Normal heart sounds. No murmur heard. Pulmonary:     Effort: Pulmonary effort is normal.     Breath sounds: Normal breath sounds. No rales.  Abdominal:     Tenderness: There is no abdominal tenderness.  Musculoskeletal:     Right lower leg: No edema.     Left lower leg: No edema.  Skin:    General: Skin is warm and dry.  Neurological:     Mental Status: He is alert and oriented to person, place, and time.  Psychiatric:        Mood and Affect: Mood normal.    Assessment & Plan:  PAX REASONER is a 70 y.o. male . Hyperlipidemia, unspecified hyperlipidemia type - Plan: Comprehensive metabolic panel, Lipid panel Hypertriglyceridemia - Plan: Lipid panel  - likely combination of diet indiscretion as well as hyperglycemia.  Plan dietary improvements.  Adjusting dose of Jardiance.  6-week follow-up for lipid panel.  Consider addition of Tricor or niacin depending on hypertriglyceridemia. Pancreatitis signs or symptoms  discussed with ER precautions.  Type 2 diabetes mellitus with hyperglycemia, without long-term current use of insulin (HCC) -  Plan: empagliflozin (JARDIANCE) 25 MG TABS tablet, Hemoglobin A1c  Decreased control, plans on improving diet.  We will also increase Jardiance.  Recheck with lab only visit in 6 weeks.  53-month follow-up.  Left-sided low back pain without sciatica, unspecified chronicity  -Improving with home exercise program.  Continue same.  Consider formal physical therapy or Ortho eval if not improved.  Meds ordered this encounter  Medications   empagliflozin (JARDIANCE) 25 MG TABS tablet    Sig: Take 1 tablet (25 mg total) by mouth daily before breakfast.    Dispense:  90 tablet    Refill:  1   Patient Instructions  I am glad to hear that the back is improving. Continue the exercises at home, tylenol if needed and if pain worsens I can refer you back to orthopaedics.   Improved diet and diabetes control should help triglycerides. Recheck in 6 weeks with lab visit. Can decide on adding additional med at that time. Review notes from diabetes classes, but If you would like to meet with a nutritionist let me know.   Try higher dose of Jardiance (25mg ) per day and diet changes should help blood sugar.   Shingles vaccine can be given at your pharmacy. Shingrix.    High Triglycerides Eating Plan Triglycerides are a type of fat in the blood. High levels of triglycerides can increase your risk of heart disease and stroke. If your triglyceride levels are high, choosing the right foods can help lower your triglycerides and keep your heart healthy. Work with your health care provider or a diet and nutrition specialist (dietitian) to develop an eating plan that is right for you. What are tips for following this plan? General guidelines  Lose weight, if you are overweight. For most people, losing 5-10 lbs (2-5 kg) helps lower triglyceride levels. A weight-loss plan may include. 30  minutes of exercise at least 5 days a week. Reducing the amount of calories, sugar, and fat you eat. Eat a wide variety of fresh fruits, vegetables, and whole grains. These foods are high in fiber. Eat foods that contain healthy fats, such as fatty fish, nuts, seeds, and olive oil. Avoid foods that are high in added sugar, added salt (sodium), saturated fat, and trans fat. Avoid low-fiber, refined carbohydrates such as white bread, crackers, noodles, and white rice. Avoid foods with partially hydrogenated oils (trans fats), such as fried foods or stick margarine. Limit alcohol intake to no more than 1 drink a day for nonpregnant women and 2 drinks a day for men. One drink equals 12 oz of beer, 5 oz of wine, or 1 oz of hard liquor. Your health care provider may recommend that you drink less depending on your overall health.  Reading food labels Check food labels for the amount of saturated fat. Choose foods with no or very little saturated fat. Check food labels for the amount of trans fat. Choose foods with no trans fat. Check food labels for the amount of cholesterol. Choose foods low in cholesterol. Ask your dietitian how much cholesterol you should have each day. Check food labels for the amount of sodium. Choose foods with less than 140 milligrams (mg) per serving. Shopping Buy dairy products labeled as nonfat (skim) or low-fat (1%). Avoid buying processed or prepackaged foods. These are often high in added sugar, sodium, and fat. Cooking Choose healthy fats when cooking, such as olive oil or canola oil. Cook foods using lower fat methods, such as baking, broiling, boiling, or grilling. Make  your own sauces, dressings, and marinades when possible, instead of buying them. Store-bought sauces, dressings, and marinades are often high in sodium and sugar. Meal planning Eat more home-cooked food and less restaurant, buffet, and fast food. Eat fatty fish at least 2 times each week. Examples of  fatty fish include salmon, trout, mackerel, tuna, and herring. If you eat whole eggs, do not eat more than 3 egg yolks per week. What foods are recommended? The items listed may not be a complete list. Talk with your dietitian aboutwhat dietary choices are best for you. Grains Whole wheat or whole grain breads, crackers, cereals, and pasta. Unsweetenedoatmeal. Bulgur. Barley. Quinoa. Brown rice. Whole wheat flour tortillas. Vegetables Fresh or frozen vegetables. Low-sodium canned vegetables. Fruits All fresh, canned (in natural juice), or frozen fruits. Meats and other protein foods Skinless chicken or [place]. Ground chicken or [place]. Lean cuts of pork, trimmed of fat. Fish and seafood, especially salmon, trout, and herring. Egg whites. Dried beans, peas, or lentils. Unsalted nuts or seeds. Unsalted cannedbeans. Natural peanut or almond butter. Dairy Low-fat dairy products. Skim or low-fat (1%) milk. Reduced fat (2%) and low-sodium cheese. Low-fat ricotta cheese. Low-fat cottage cheese. Plain,low-fat yogurt. Fats and oils Tub margarine without trans fats. Light or reduced-fat mayonnaise. Light or reduced-fat salad dressings.Avocado. Safflower, olive, sunflower, soybean, and canola oils. What foods are not recommended? The items listed may not be a complete list. Talk with your dietitian aboutwhat dietary choices are best for you. Grains White bread. White (regular) pasta. White rice. Cornbread. Bagels. Pastries. Crackers that contain trans fat. Vegetables Creamed or fried vegetables. Vegetables in a cheese sauce. Fruits Sweetened dried fruit. Canned fruit in syrup. Fruit juice. Meats and other protein foods Fatty cuts of meat. Ribs. Chicken wings. Berniece Salines. Sausage. Bologna. Salami.Chitterlings. Fatback. Hot dogs. Bratwurst. Packaged lunch meats. Dairy Whole or reduced-fat (2%) milk. Half-and-half. Cream cheese. Full-fat or sweetened yogurt. Full-fat cheese. Nondairy creamers. Whipped  toppings.Processed cheese or cheese spreads. Cheese curds. Beverages Alcohol. Sweetened drinks, such as soda, lemonade, fruit drinks, or punches. Fats and oils Butter. Stick margarine. Lard. Shortening. Ghee. Bacon fat. Tropical oils, suchas coconut, palm kernel, or palm oils. Sweets and desserts Corn syrup. Sugars. Honey. Molasses. Candy. Jam and jelly. Syrup. Sweetenedcereals. Cookies. Pies. Cakes. Donuts. Muffins. Ice cream. Condiments Store-bought sauces, dressings, and marinades that are high in sugar, such asketchup and barbecue sauce. Summary High levels of triglycerides can increase the risk of heart disease and stroke. Choosing the right foods can help lower your triglycerides. Eat plenty of fresh fruits, vegetables, and whole grains. Choose low-fat dairy and lean meats. Eat fatty fish at least twice a week. Avoid processed and prepackaged foods with added sugar, sodium, saturated fat, and trans fat. If you need suggestions or have questions about what types of food are good for you, talk with your health care provider or a dietitian. This information is not intended to replace advice given to you by your health care provider. Make sure you discuss any questions you have with your healthcare provider. Document Revised: 03/16/2020 Document Reviewed: 03/18/2020 Elsevier Patient Education  2022 [place],   [name], MD [place] 06/04/21 12:10 PM

## 2021-06-09 ENCOUNTER — Ambulatory Visit (INDEPENDENT_AMBULATORY_CARE_PROVIDER_SITE_OTHER): Payer: Medicare PPO

## 2021-06-09 DIAGNOSIS — I428 Other cardiomyopathies: Secondary | ICD-10-CM

## 2021-06-09 LAB — CUP PACEART REMOTE DEVICE CHECK
Battery Remaining Longevity: 44 mo
Battery Voltage: 2.97 V
Brady Statistic AP VP Percent: 3.12 %
Brady Statistic AP VS Percent: 77.12 %
Brady Statistic AS VP Percent: 0.02 %
Brady Statistic AS VS Percent: 19.74 %
Brady Statistic RA Percent Paced: 79.55 %
Brady Statistic RV Percent Paced: 3.03 %
Date Time Interrogation Session: 20220713033426
HighPow Impedance: 40 Ohm
HighPow Impedance: 47 Ohm
Implantable Lead Implant Date: 20120531
Implantable Lead Implant Date: 20120531
Implantable Lead Location: 753859
Implantable Lead Location: 753860
Implantable Lead Model: 4076
Implantable Lead Model: 6947
Implantable Pulse Generator Implant Date: 20180530
Lead Channel Impedance Value: 342 Ohm
Lead Channel Impedance Value: 399 Ohm
Lead Channel Impedance Value: 418 Ohm
Lead Channel Pacing Threshold Amplitude: 0.625 V
Lead Channel Pacing Threshold Amplitude: 0.625 V
Lead Channel Pacing Threshold Pulse Width: 0.4 ms
Lead Channel Pacing Threshold Pulse Width: 0.4 ms
Lead Channel Sensing Intrinsic Amplitude: 1.625 mV
Lead Channel Sensing Intrinsic Amplitude: 1.625 mV
Lead Channel Sensing Intrinsic Amplitude: 11.625 mV
Lead Channel Sensing Intrinsic Amplitude: 11.625 mV
Lead Channel Setting Pacing Amplitude: 1.5 V
Lead Channel Setting Pacing Amplitude: 2 V
Lead Channel Setting Pacing Pulse Width: 0.4 ms
Lead Channel Setting Sensing Sensitivity: 0.3 mV

## 2021-06-23 ENCOUNTER — Other Ambulatory Visit: Payer: Self-pay | Admitting: Internal Medicine

## 2021-06-23 DIAGNOSIS — I10 Essential (primary) hypertension: Secondary | ICD-10-CM

## 2021-06-23 DIAGNOSIS — I472 Ventricular tachycardia, unspecified: Secondary | ICD-10-CM

## 2021-06-26 IMAGING — DX DG LUMBAR SPINE COMPLETE 4+V
5 series · 5 of 5 positions shown · non-contrast
Comparison: None.

CLINICAL DATA: Low back pain for 3 months.  No known injury.

EXAM:
LUMBAR SPINE - COMPLETE 4+ VIEW

[lumbar spine ap]
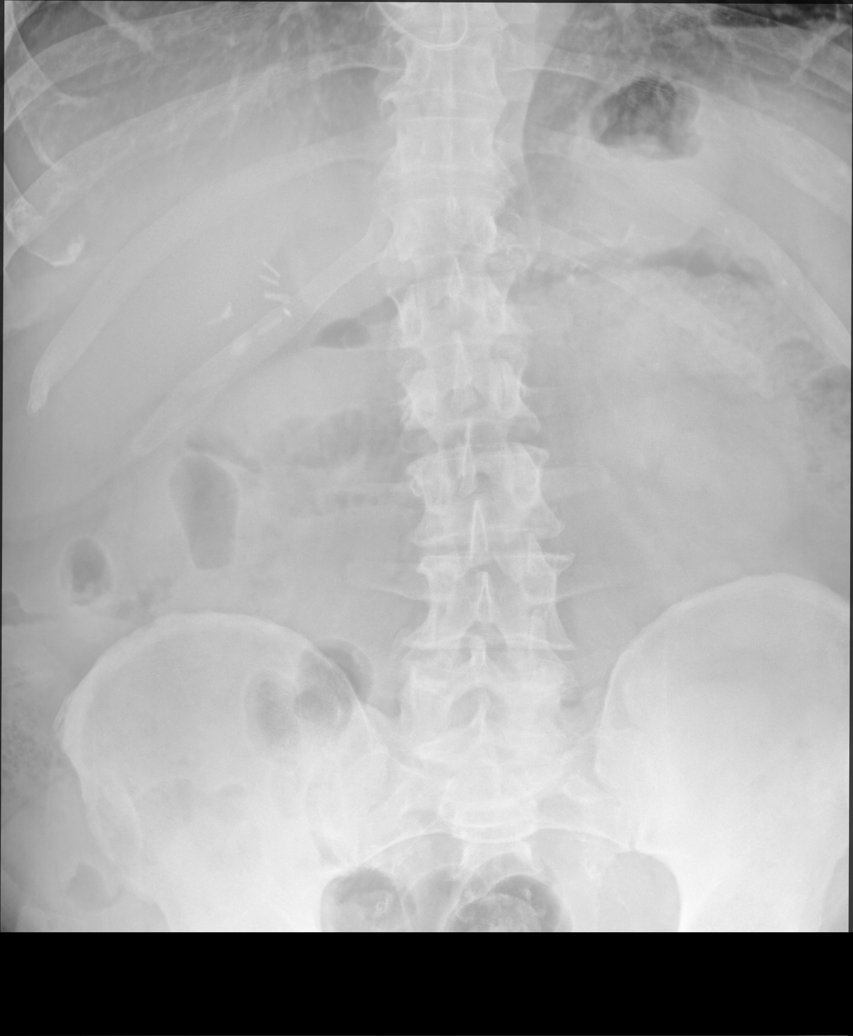

[lumbar spine oblique (1 of 2)]
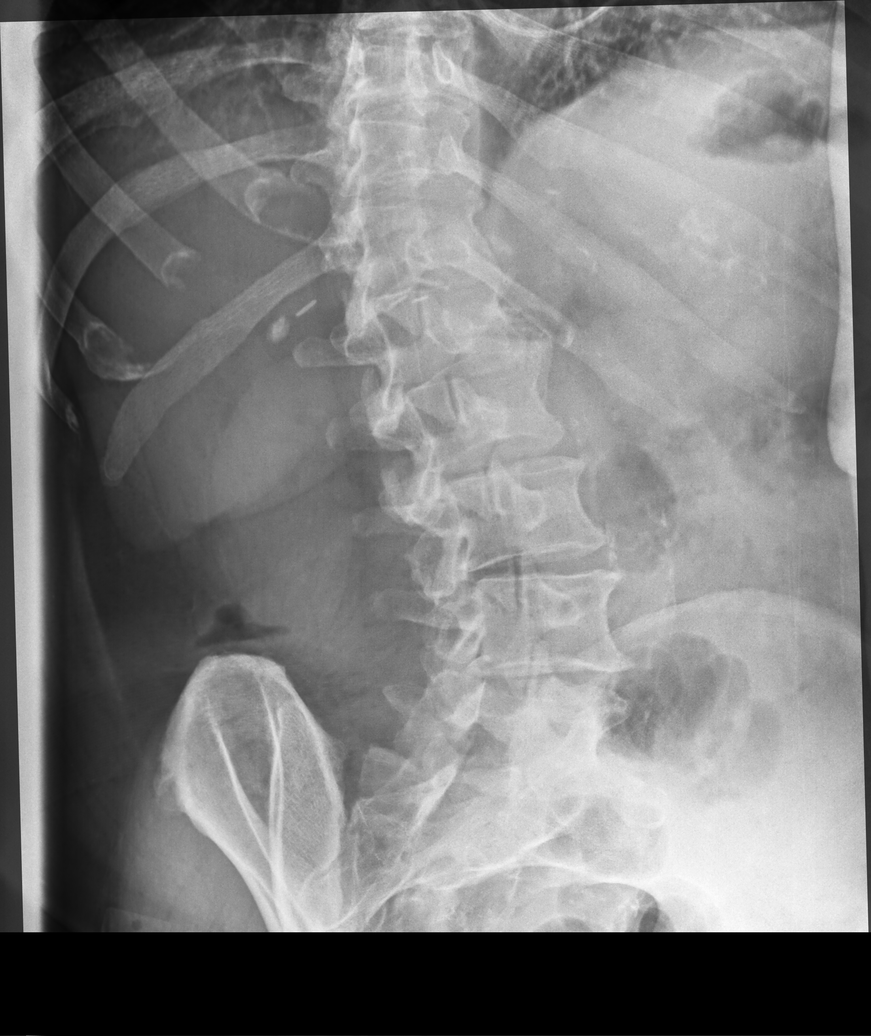

[lumbar spine oblique (2 of 2)]
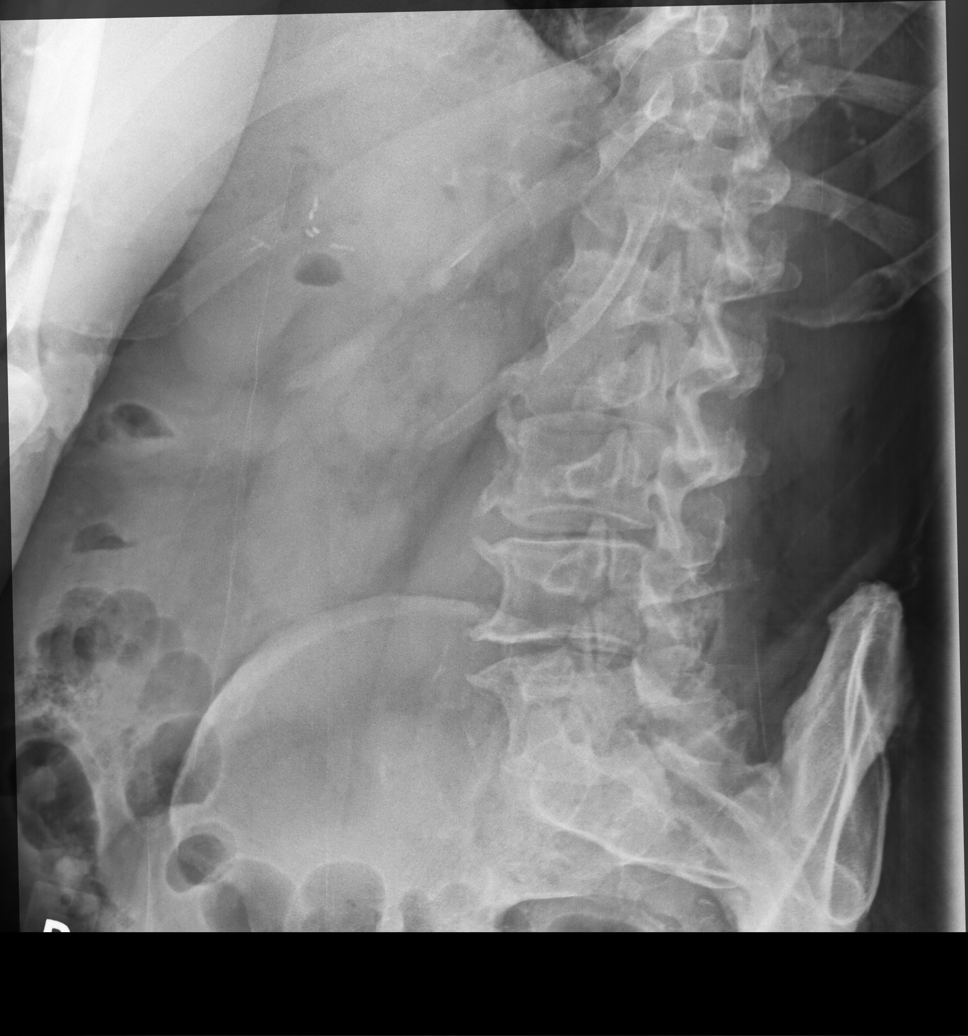

[lumbar spine lat (1 of 2)]
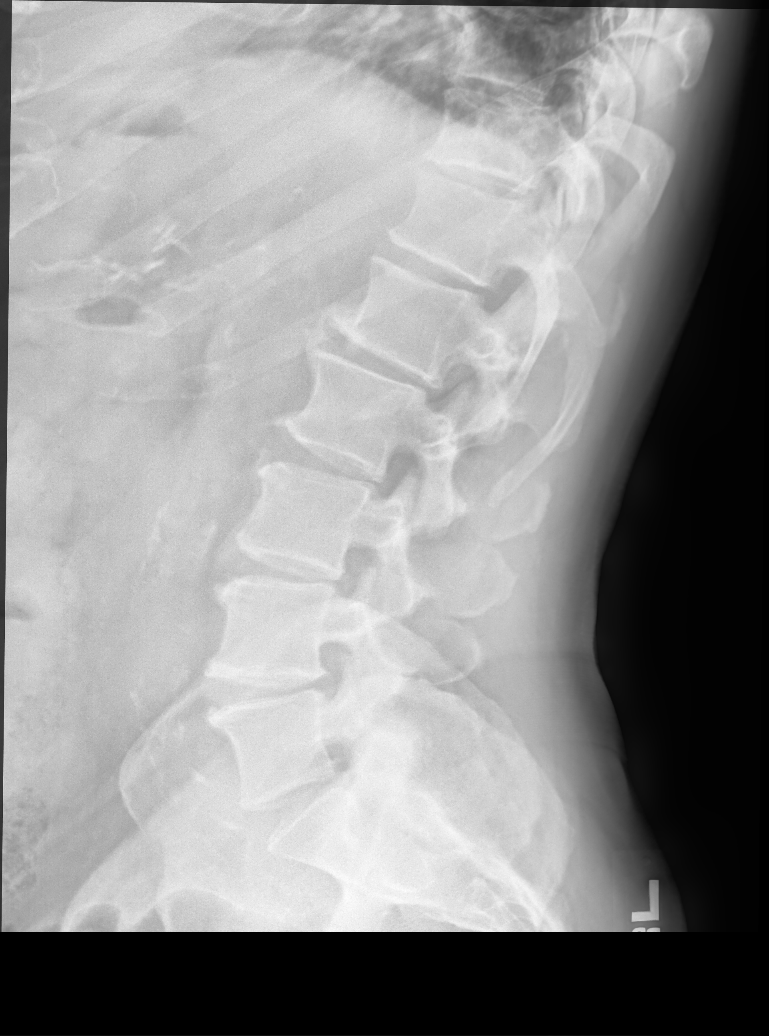

[lumbar spine lat (2 of 2)]
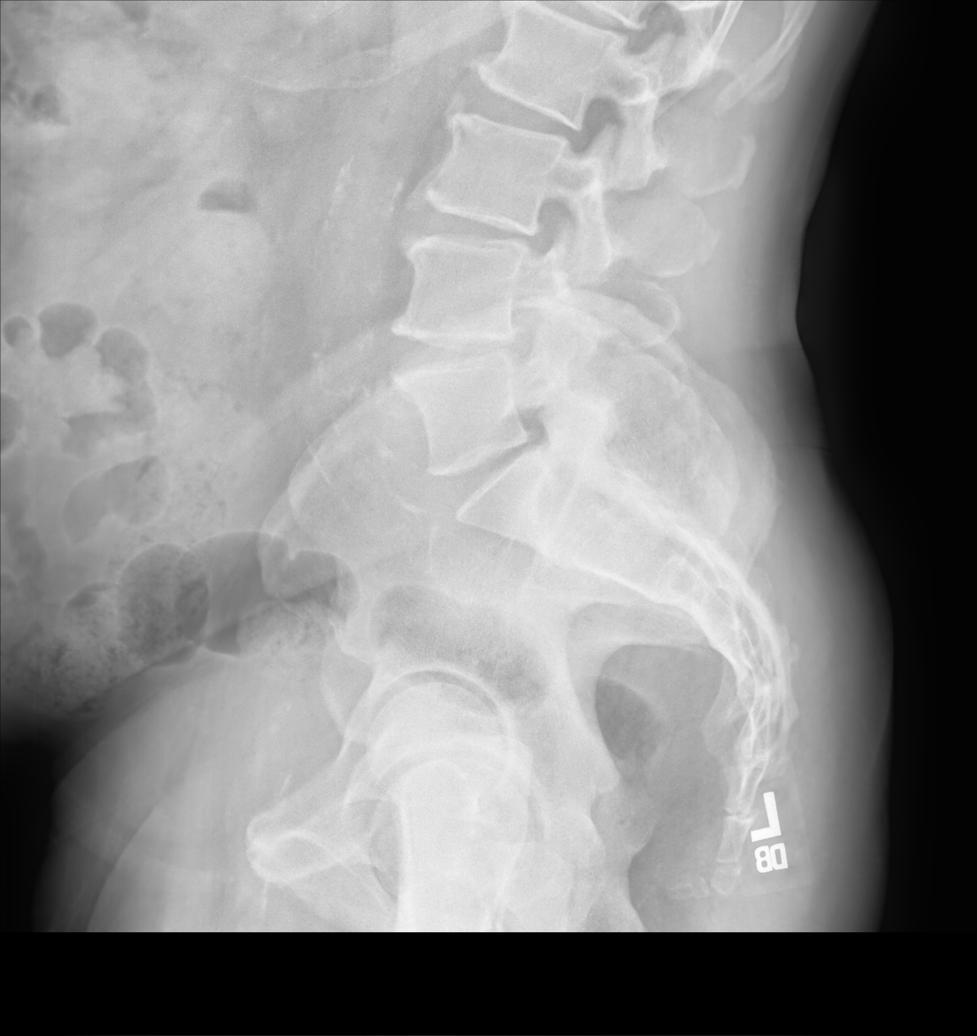

[5 of 5 positions shown; findings below may reference images not displayed]

FINDINGS: There is no fracture or malalignment. Mild-to-moderate multilevel
loss of disc space height and endplate spurring appear worst at
L4-5. Facet degenerative change is seen at L5-S1. Paraspinous
structures demonstrate aortic atherosclerosis.
IMPRESSION: Mild-to-moderate appearing multilevel degenerative disease. No acute
abnormality.

Aortic Atherosclerosis (MQ0HS-H29.9).

## 2021-07-02 NOTE — Progress Notes (Signed)
Remote ICD transmission.   

## 2021-07-16 ENCOUNTER — Other Ambulatory Visit: Payer: Medicare PPO

## 2021-07-20 ENCOUNTER — Other Ambulatory Visit (INDEPENDENT_AMBULATORY_CARE_PROVIDER_SITE_OTHER): Payer: Medicare PPO

## 2021-07-20 ENCOUNTER — Other Ambulatory Visit: Payer: Self-pay

## 2021-07-20 DIAGNOSIS — E1165 Type 2 diabetes mellitus with hyperglycemia: Secondary | ICD-10-CM

## 2021-07-20 DIAGNOSIS — E781 Pure hyperglyceridemia: Secondary | ICD-10-CM | POA: Diagnosis not present

## 2021-07-20 DIAGNOSIS — E785 Hyperlipidemia, unspecified: Secondary | ICD-10-CM | POA: Diagnosis not present

## 2021-07-20 LAB — LIPID PANEL
Cholesterol: 121 mg/dL (ref 0–200)
HDL: 28.7 mg/dL — ABNORMAL LOW (ref >39.00)
Total CHOL/HDL Ratio: 4
Triglycerides: 511 mg/dL — ABNORMAL HIGH (ref 0.0–149.0)

## 2021-07-20 LAB — COMPREHENSIVE METABOLIC PANEL
ALT: 18 U/L (ref 0–53)
AST: 25 U/L (ref 0–37)
Albumin: 4.4 g/dL (ref 3.5–5.2)
Alkaline Phosphatase: 39 U/L (ref 39–117)
BUN: 17 mg/dL (ref 6–23)
CO2: 25 mEq/L (ref 19–32)
Calcium: 9.7 mg/dL (ref 8.4–10.5)
Chloride: 100 mEq/L (ref 96–112)
Creatinine, Ser: 1.04 mg/dL (ref 0.40–1.50)
GFR: 71.81 mL/min (ref >60.00)
Glucose, Bld: 144 mg/dL — ABNORMAL HIGH (ref 70–99)
Potassium: 4.3 mEq/L (ref 3.5–5.1)
Sodium: 137 mEq/L (ref 135–145)
Total Bilirubin: 0.6 mg/dL (ref 0.2–1.2)
Total Protein: 6.7 g/dL (ref 6.0–8.3)

## 2021-07-20 LAB — HEMOGLOBIN A1C: Hgb A1c MFr Bld: 7.8 % — ABNORMAL HIGH (ref 4.6–6.5)

## 2021-07-20 LAB — LDL CHOLESTEROL, DIRECT: Direct LDL: 39 mg/dL

## 2021-08-05 ENCOUNTER — Encounter: Payer: Self-pay | Admitting: Internal Medicine

## 2021-08-05 ENCOUNTER — Other Ambulatory Visit: Payer: Self-pay

## 2021-08-05 ENCOUNTER — Ambulatory Visit: Payer: Medicare PPO | Admitting: Internal Medicine

## 2021-08-05 VITALS — BP 110/58 | HR 78 | Ht 65.0 in | Wt 185.6 lb

## 2021-08-05 DIAGNOSIS — I472 Ventricular tachycardia, unspecified: Secondary | ICD-10-CM

## 2021-08-05 DIAGNOSIS — Z95810 Presence of automatic (implantable) cardiac defibrillator: Secondary | ICD-10-CM | POA: Diagnosis not present

## 2021-08-05 NOTE — Patient Instructions (Signed)

## 2021-08-05 NOTE — Progress Notes (Addendum)
Patient Care Team: Wendie Agreste, MD as PCP - General (Family Medicine) [name], MD (Cardiology) [name], MD as Attending Physician (Neurology)   HPI  Dakota Gutierrez is a 70 y.o. 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 Unfortunately, in the days that followed he developed VT storm with multiple shocks and was treated with amiodarone which has been gradually down titrated to 100 mg a day.  Amiodarone surveillance had demonstrated elevated liver function tests spring 2013 . We elected to discontinue the amiodarone albeit with some reluctance on the part of the family. When seen in June of recurrent episodes of ventricular tachycardia 2 of which required therapy. He was then resumed on amiodarone and referred for consultation with GI  most recently 4/17 transaminases were normal.     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 There have been no palpitations, lightheadedness or syncope . Complains of shortness of breath with exercise effort but not with normal.  Activities Patient denies symptoms of GI intolerance, sun sensitivity, neurological symptoms attributable to amiodarone.    Hemoglobin A1c is elevated.  Hurt his back a few months ago.  Much less active.  Had questions about the reprogramming that prompted the last visit-there was ventricular tachycardia below detection and so the detection rates were lowered   Date Cr K TSH  ALT   5/18   3.84 25    3/19   3.73 27  8/19   3.4 24  3/20   3.25   12/20 0.97 4.4  21  6/21 0.98 4.3  18  5/22 098 4.3 3.67(3/22)` 23   DATE TEST EF   2012 MYOVIEW 56% No Ischemia  5/17 Echo   45-50 %   1/21 Echo  60%         Past Medical History:  Diagnosis Date   Anxiety     secondary to ICD shocks   Diabetes mellitus without complication ([place])    Dual implantable cardiac defibrillator in situ    Medtronic [id number] Protecta   Elevated transaminase level    10/2011  ? 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    Past Surgical History:  Procedure Laterality Date   CARDIAC DEFIBRILLATOR PLACEMENT     Medtronic [id number] Protecta   CATARACT EXTRACTION, BILATERAL  2019   CHOLECYSTECTOMY     COLONOSCOPY WITH PROPOFOL N/A 06/30/2017   Procedure: COLONOSCOPY WITH PROPOFOL;  Surgeon: Carol Ada, MD;  Location: [facility];  Service: Endoscopy;  Laterality: N/A;   GALLBLADDER SURGERY     HERNIA REPAIR     ICD GENERATOR CHANGEOUT N/A 04/26/2017   Procedure: ICD Generator Changeout;  Surgeon: [name], MD;  Location: [facility];  Service: Cardiovascular;  Laterality: N/A;   TONSILLECTOMY AND ADENOIDECTOMY      Current Outpatient Medications  Medication Sig Dispense Refill   acetaminophen (TYLENOL) 500 MG tablet Take 1,000 mg by mouth daily as needed for moderate pain or headache.  amiodarone (PACERONE) 200 MG tablet Take 1/2 (one-half) tablet by mouth once daily 45 tablet 1   aspirin EC 81 MG tablet Take 1 tablet (81 mg total) by mouth daily.     atorvastatin (LIPITOR) 10 MG tablet Take 1 tablet (10 mg total) by mouth daily. 90 tablet 2   Cholecalciferol (VITAMIN D) 2000 UNITS tablet Take 2,000 Units by mouth daily.     diclofenac Sodium (VOLTAREN) 1 % GEL Apply 4 g topically 4 (four) times daily. Rub into affected area of foot 2 to 4 times daily 100 g 2   empagliflozin (JARDIANCE) 25 MG TABS tablet Take 1 tablet (25 mg total) by mouth daily before breakfast. 90 tablet 1    famotidine (PEPCID) 20 MG tablet Take 1 tablet (20 mg total) by mouth 2 (two) times daily as needed. 60 tablet 4   gabapentin (NEURONTIN) 100 MG capsule Take 1 capsule (100 mg total) by mouth at bedtime. 90 capsule 1   metFORMIN (GLUCOPHAGE) 500 MG tablet Take 1 tablet (500 mg total) by mouth daily with breakfast. 90 tablet 3   metoprolol succinate (TOPROL-XL) 100 MG 24 hr tablet TAKE 1 TABLET BY MOUTH ONCE DAILY IMMEDIATELY  FOLLOWING  A  MEAL 90 tablet 2   Molnupiravir 200 MG CAPS Take 4 capsules (800 mg total) by mouth in the morning and at bedtime. 40 capsule 0   olmesartan-hydrochlorothiazide (BENICAR HCT) 20-12.5 MG tablet Take 1 tablet by mouth daily. 90 tablet 2   Omega-3 Fatty Acids (FISH OIL) 1000 MG CAPS Take 1,000 mg by mouth 2 (two) times daily.     OVER THE COUNTER MEDICATION      tadalafil (CIALIS) 20 MG tablet Take 0.5-1 tablets (10-20 mg total) by mouth daily as needed for erectile dysfunction. 10 tablet 11   traMADol (ULTRAM) 50 MG tablet      No current facility-administered medications for this visit.    Allergies  Allergen Reactions   Adhesive [Tape]     Swelling and welts formed    Review of Systems negative except from HPI and PMH except for  (+) shortness of breath   Physical Exam BP (!) 110/58   Pulse 78   Ht '5\' 5"'$  (1.651 m)   Wt 185 lb 9.6 oz (84.2 kg)   SpO2 94%   BMI 30.89 kg/m   Well developed and well nourished in no acute distress HENT normal Neck supple with JVP-flat Clear Device pocket well healed; without hematoma or erythema.  There is no tethering  Regular rate and rhythm, no  gallop  / no murmur Abd-soft with active BS No Clubbing cyanosis , no edema Skin-warm and dry A & Oriented  Grossly normal sensory and motor function   . ECG atrial paced at 78 Intervals 13/11/38   ECG sinus at 82 Intervals 20/11/38  Assessment and  Plan  VT    ICD Medtronic  The patient's device was interrogated and the information was fully reviewed.   The device was reprogrammed to decrease the slope of his ADL rate  Sinus noded dysfunction   Amiodarone rx   HFpEF  chronic  Diabetes with elevated hemoglobin A1c    Very limited since her was back 6 months ago.  Discussed exercise options so to be able to avoid pain to his knee into his back, more walking and poorly exercise bike.  No interval atrial fibrillation no interval ventricular tachycardia.  We will continue his amiodarone at 100 mg daily.  Surveillance laboratories were  normal earlier this spring.  He is euvolemic.  We will continue his Jardiance.  Blood pressures are much improved on a low-salt diet.  We will continue his metoprolol at 100 mg a day as its adjunctive to his amiodarone for ventricular tachycardia with Benicar 20/12.5  I,Jada Bradford,acting as a scribe for Virl Axe, MD.,have documented all relevant documentation on the behalf of Virl Axe, MD,as directed by  Virl Axe, MD while in the presence of Virl Axe, MD.  I, Virl Axe, MD, have reviewed all documentation for this visit. The documentation on 08/05/21 for the exam, diagnosis, procedures, and orders are all accurate and complete.

## 2021-09-06 ENCOUNTER — Ambulatory Visit: Payer: Medicare PPO | Admitting: Family Medicine

## 2021-09-08 ENCOUNTER — Ambulatory Visit (INDEPENDENT_AMBULATORY_CARE_PROVIDER_SITE_OTHER): Payer: Medicare PPO

## 2021-09-08 DIAGNOSIS — I428 Other cardiomyopathies: Secondary | ICD-10-CM | POA: Diagnosis not present

## 2021-09-09 LAB — CUP PACEART REMOTE DEVICE CHECK
Battery Remaining Longevity: 38 mo
Battery Voltage: 2.96 V
Brady Statistic AP VP Percent: 6.28 %
Brady Statistic AP VS Percent: 79.12 %
Brady Statistic AS VP Percent: 0.02 %
Brady Statistic AS VS Percent: 14.58 %
Brady Statistic RA Percent Paced: 85.09 %
Brady Statistic RV Percent Paced: 6.19 %
Date Time Interrogation Session: 20221012022708
HighPow Impedance: 39 Ohm
HighPow Impedance: 49 Ohm
Implantable Lead Implant Date: 20120531
Implantable Lead Implant Date: 20120531
Implantable Lead Location: 753859
Implantable Lead Location: 753860
Implantable Lead Model: 4076
Implantable Lead Model: 6947
Implantable Pulse Generator Implant Date: 20180530
Lead Channel Impedance Value: 342 Ohm
Lead Channel Impedance Value: 418 Ohm
Lead Channel Impedance Value: 418 Ohm
Lead Channel Pacing Threshold Amplitude: 0.5 V
Lead Channel Pacing Threshold Amplitude: 0.625 V
Lead Channel Pacing Threshold Pulse Width: 0.4 ms
Lead Channel Pacing Threshold Pulse Width: 0.4 ms
Lead Channel Sensing Intrinsic Amplitude: 0.875 mV
Lead Channel Sensing Intrinsic Amplitude: 0.875 mV
Lead Channel Sensing Intrinsic Amplitude: 10 mV
Lead Channel Sensing Intrinsic Amplitude: 10 mV
Lead Channel Setting Pacing Amplitude: 1.5 V
Lead Channel Setting Pacing Amplitude: 2 V
Lead Channel Setting Pacing Pulse Width: 0.4 ms
Lead Channel Setting Sensing Sensitivity: 0.3 mV

## 2021-09-16 ENCOUNTER — Telehealth: Payer: Self-pay | Admitting: Family Medicine

## 2021-09-16 NOTE — Progress Notes (Signed)
Remote ICD transmission.   

## 2021-09-16 NOTE — Telephone Encounter (Signed)
..  Caller name:Dakota Gutierrez  On DPR? :yes/no: Yes  Call back number:[phone]  Provider they see: Carlota Raspberry  Reason for call: Patient was here on 06/04/2021 and paid his $20.00 co pay - He has received a bill stating that he owes and extra $20.00.  $40.00 is the co pay for a specialist.  Patient feels that this was an error in coding.  Please advise.

## 2021-09-23 ENCOUNTER — Other Ambulatory Visit: Payer: Self-pay | Admitting: Internal Medicine

## 2021-10-20 ENCOUNTER — Ambulatory Visit: Payer: Medicare PPO | Admitting: Family Medicine

## 2021-11-25 ENCOUNTER — Encounter: Payer: Self-pay | Admitting: Family Medicine

## 2021-11-25 ENCOUNTER — Ambulatory Visit: Payer: Medicare PPO | Admitting: Family Medicine

## 2021-11-25 ENCOUNTER — Other Ambulatory Visit: Payer: Self-pay

## 2021-11-25 VITALS — BP 138/72 | HR 76 | Temp 98.4°F | Resp 18 | Ht 65.0 in | Wt 186.0 lb

## 2021-11-25 DIAGNOSIS — I10 Essential (primary) hypertension: Secondary | ICD-10-CM

## 2021-11-25 DIAGNOSIS — E1165 Type 2 diabetes mellitus with hyperglycemia: Secondary | ICD-10-CM

## 2021-11-25 DIAGNOSIS — Z23 Encounter for immunization: Secondary | ICD-10-CM | POA: Diagnosis not present

## 2021-11-25 DIAGNOSIS — E781 Pure hyperglyceridemia: Secondary | ICD-10-CM

## 2021-11-25 LAB — COMPREHENSIVE METABOLIC PANEL
ALT: 23 U/L (ref 0–53)
AST: 26 U/L (ref 0–37)
Albumin: 4.6 g/dL (ref 3.5–5.2)
Alkaline Phosphatase: 40 U/L (ref 39–117)
BUN: 15 mg/dL (ref 6–23)
CO2: 24 mEq/L (ref 19–32)
Calcium: 9.3 mg/dL (ref 8.4–10.5)
Chloride: 102 mEq/L (ref 96–112)
Creatinine, Ser: 0.98 mg/dL (ref 0.40–1.50)
GFR: 76.93 mL/min (ref >60.00)
Glucose, Bld: 156 mg/dL — ABNORMAL HIGH (ref 70–99)
Potassium: 4.1 mEq/L (ref 3.5–5.1)
Sodium: 138 mEq/L (ref 135–145)
Total Bilirubin: 0.8 mg/dL (ref 0.2–1.2)
Total Protein: 7.1 g/dL (ref 6.0–8.3)

## 2021-11-25 LAB — LDL CHOLESTEROL, DIRECT: Direct LDL: 57 mg/dL

## 2021-11-25 LAB — MICROALBUMIN / CREATININE URINE RATIO
Creatinine,U: 83.5 mg/dL
Microalb Creat Ratio: 1.6 mg/g (ref 0.0–30.0)
Microalb, Ur: 1.4 mg/dL (ref 0.0–1.9)

## 2021-11-25 LAB — LIPID PANEL
Cholesterol: 144 mg/dL (ref 0–200)
HDL: 33.3 mg/dL — ABNORMAL LOW (ref >39.00)
Total CHOL/HDL Ratio: 4
Triglycerides: 454 mg/dL — ABNORMAL HIGH (ref 0.0–149.0)

## 2021-11-25 LAB — HEMOGLOBIN A1C: Hgb A1c MFr Bld: 8.2 % — ABNORMAL HIGH (ref 4.6–6.5)

## 2021-11-25 MED ORDER — METFORMIN HCL 500 MG PO TABS: 500.0000 mg | ORAL_TABLET | Freq: Every day | ORAL | 3 refills | Status: DC

## 2021-11-25 MED ORDER — EMPAGLIFLOZIN 25 MG PO TABS: 25.0000 mg | ORAL_TABLET | Freq: Every day | ORAL | 1 refills | Status: DC

## 2021-11-25 NOTE — Patient Instructions (Addendum)
Depending on labs, we can add med for triglycerides and diabetes if needed. Watch food choices, walking or other low intensity exercise may help as well.   Thanks for coming in today.   High Triglycerides Eating Plan Triglycerides are a type of fat in the blood. High levels of triglycerides can increase your risk of heart disease and stroke. If your triglyceride levels are high, choosing the right foods can help lower your triglycerides and keep your heart healthy. Work with your health care provider or a dietitian to develop an eating plan that is right for you. What are tips for following this plan? General guidelines  Lose weight, if you are overweight. For most people, losing 5-10 lb (2-5 kg) helps lower triglyceride levels. A weight-loss plan may include: 30 minutes of exercise at least 5 days a week. Reducing the amount of calories, sugar, and fat you eat. Eat a wide variety of fresh fruits, vegetables, and whole grains. These foods are high in fiber. Eat foods that contain healthy fats, such as fatty fish, nuts, seeds, and olive oil. Avoid foods that are high in added sugar, added salt (sodium), and saturated fat. Avoid low-fiber, refined carbohydrates such as white bread, crackers, noodles, and white rice. Avoid foods with trans fats or partially hydrogenated oils, such as fried foods or stick margarine. If you drink alcohol: Limit how much you have to: 0-1 drink a day for women who are not pregnant. 0-2 drinks a day for men. Your health care provider may recommend that you drink less than these amounts depending on your overall health. Know how much alcohol is in a drink. In the [place] one drink equals one 12 oz bottle of beer (355 mL), one 5 oz glass of wine (148 mL), or one 1 oz glass of hard liquor (44 mL). Reading food labels Check food labels for: The amount of saturated fat. Choose foods with no or very little saturated fat (less than 2 g). The amount of trans fat. Choose  foods with no transfat. The amount of cholesterol. Choose foods that are low in cholesterol. The amount of sodium. Choose foods with less than 140 milligrams (mg) per serving. Shopping Buy dairy products labeled as nonfat (skim) or low-fat (1%). Avoid buying processed or prepackaged foods. These are often high in added sugar, sodium, and fat. Cooking Choose healthy fats when cooking, such as olive oil, avocado oil, or canola oil. Cook foods using lower fat methods, such as baking, broiling, boiling, or grilling. Make your own sauces, dressings, and marinades when possible, instead of buying them. Store-bought sauces, dressings, and marinades are often high in sodium and sugar. Meal planning Eat more home-cooked food and less restaurant, buffet, and fast food. Eat fatty fish at least 2 times each week. Examples of fatty fish include salmon, trout, sardines, mackerel, tuna, and herring. If you eat whole eggs, do not eat more than 4 egg yolks per week. What foods should I eat? Fruits All fresh, canned (in natural juice), or frozen fruits. Vegetables Fresh or frozen vegetables. Low-sodium canned vegetables. Grains Whole wheat or whole grain breads, crackers, cereals, and pasta. Unsweetened oatmeal. Bulgur. Barley. Quinoa. Brown rice. Whole wheat flour tortillas. Meats and other proteins Skinless chicken or [place]. Ground chicken or [place]. Lean cuts of pork, trimmed of fat. Fish and seafood, especially salmon, trout, and herring. Egg whites. Dried beans, peas, or lentils. Unsalted nuts or seeds. Unsalted canned beans. Natural peanut or almond butter or other nut butters. Dairy Low-fat dairy  products. Skim or low-fat (1%) milk. Reduced fat (2%) and low-sodium cheese. Low-fat ricotta cheese. Low-fat cottage cheese. Plain, low-fat yogurt. Fats and oils Tub margarine without trans fats. Light or reduced-fat mayonnaise. Light or reduced-fat salad dressings. Avocado. Safflower, olive, sunflower,  soybean, and canola oils. The items listed above may not be a complete list of recommended foods and beverages. Talk with your dietitian about what dietary choices are best for you. What foods should I avoid? Fruits Sweetened dried fruit. Canned fruit in syrup. Fruit juice. Vegetables Creamed or fried vegetables. Vegetables in a cheese sauce. Grains White bread. White (regular) pasta. White rice. Cornbread. Bagels. Pastries. Crackers that contain trans fat. Meats and other proteins Fatty cuts of meat. Ribs. Chicken wings. Berniece Salines. Sausage. Bologna. Salami. Chitterlings. Fatback. Hot dogs. Bratwurst. Packaged lunch meats. Dairy Whole or reduced-fat (2%) milk. Half-and-half. Cream cheese. Full-fat or sweetened yogurt. Full-fat cheese. Nondairy creamers. Whipped toppings. Processed cheese or cheese spreads. Cheese curds. Fats and oils Butter. Stick margarine. Lard. Shortening. Ghee. Bacon fat. Tropical oils, such as coconut, palm kernel, or palm oils. Beverages Alcohol. Sweetened drinks, such as soda, lemonade, fruit drinks, or punches. Sweets and desserts Corn syrup. Sugars. Honey. Molasses. Candy. Jam and jelly. Syrup. Sweetened cereals. Cookies. Pies. Cakes. Donuts. Muffins. Ice cream. Condiments Store-bought sauces, dressings, and marinades that are high in sugar, such as ketchup and barbecue sauce. The items listed above may not be a complete list of foods and beverages you should avoid. Talk with your dietitian about what dietary choices are best for you. Summary High levels of triglycerides can increase the risk of heart disease and stroke. Choosing the right foods can help lower your triglycerides. Eat plenty of fresh fruits, vegetables, and whole grains. Choose low-fat dairy and lean meats. Eat fatty fish at least twice a week. Avoid processed and prepackaged foods with added sugar, sodium, saturated fat, and trans fat. If you need suggestions or have questions about what types of food  are good for you, talk with your health care provider or a dietitian. This information is not intended to replace advice given to you by your health care provider. Make sure you discuss any questions you have with your health care provider. Document Revised: 03/26/2021 Document Reviewed: 03/26/2021 Elsevier Patient Education  2022 [employer].    If you have lab work done today you will be contacted with your lab results within the next 2 weeks.  If you have not heard from [name] then please contact us. The fastest way to get your results is to register for My Chart.   IF you received an x-ray today, you will receive an invoice from [facility]. Please contact [facility] at [phone] with questions or concerns regarding your invoice.   IF you received labwork today, you will receive an invoice from [facility]. Please contact [facility] at [phone] with questions or concerns regarding your invoice.   Our billing staff will not be able to assist you with questions regarding bills from these companies.  You will be contacted with the lab results as soon as they are available. The fastest way to get your results is to activate your My Chart account. Instructions are located on the last page of this paperwork. If you have not heard from [name] regarding the results in 2 weeks, please contact this office.

## 2021-11-25 NOTE — Addendum Note (Signed)
Addended by: Veneda Melter on: 11/25/2021 02:30 PM   Modules accepted: Orders

## 2021-11-25 NOTE — Progress Notes (Signed)
Subjective:  Patient ID: Dakota Gutierrez, male    DOB: [birth year]  Age: 70 y.o. MRN: [id number]  CC:  Chief Complaint  Patient presents with   Follow-up    Patient states he is here to follow up on diabetes.    HPI Dakota Gutierrez presents for   Diabetes: With hyperglycemia, suspected peripheral neuropathy, treated with gabapentin. Treated with metformin, Jardiance.  Difficulty with twice daily dosing of metformin.  Tolerated daily dosing.  Higher dose of Jardiance 25 mg daily given in July.  He is on statin, ARB. Rare diarrhea.  No FH of MEN syndrome or pancreatitis. Gabapentin controlling neuropathy at night. 100mg .  No change in diet/exercise. Home readings fasting:130 Home readings postprandial 160. No 200's.  Symptomatic lows: none  Microalbumin: Normal ratio 05/13/2020 Flu vaccine: today.  Covid booster. 09/16/21 Otherwise up-to-date  Lab Results  Component Value Date   HGBA1C 7.8 (H) 07/20/2021   HGBA1C 7.8 (H) 04/05/2021   HGBA1C 6.8 (H) 05/13/2020   Lab Results  Component Value Date   MICROALBUR 0.7 09/15/2016   LDLCALC 54 05/13/2020   CREATININE 1.04 07/20/2021   Hyperlipidemia: He is on statin.  Elevated triglycerides noted in August, option of additional medication if he were not taking omega-3 fatty acids/fish oil.  Currently taking Lipitor 10 mg daily. No missed doses.  Fish oil supplement BID No abd pain/n/v Fating today.  Lab Results  Component Value Date   CHOL 121 07/20/2021   HDL 28.70 (L) 07/20/2021   LDLCALC 54 05/13/2020   LDLDIRECT 39.0 07/20/2021   TRIG (H) 07/20/2021    511.0 Triglyceride is over 400; calculations on Lipids are invalid.   CHOLHDL 4 07/20/2021   Lab Results  Component Value Date   ALT 18 07/20/2021   AST 25 07/20/2021   ALKPHOS 39 07/20/2021   BILITOT 0.6 07/20/2021    Hypertension: With OSA on CPAP, ventricular tachycardia followed by electrophysiology.  Treated with Toprol, amiodarone, Benicar HCT.   No new side effects with medications Home readings: 140/70 range at times, usually lower 128/70. Same at home assessment . 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   Posterior vitreous detachment, both eyes 02/15/2021   Optic atrophy, 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   Hypertension associated with type 2 diabetes mellitus ([place]) 11/14/2019   Type II diabetes mellitus ([place]) 01/16/2015   Hypersomnolence 33/29/5188   History of Helicobacter pylori infection 04/16/2012   GERD (gastroesophageal reflux disease) 04/16/2012   Elevated transaminase level 02/02/2012   Obesity 12/29/2011   Dual implantable cardioverter-defibrillator - Medtronic 09/06/2011   Syncope    Ventricular tachycardia ([place])    Anxiety    Past Medical History:  Diagnosis Date   Anxiety    secondary to ICD shocks   Diabetes mellitus without complication ([place])    Dual implantable cardiac defibrillator in situ    Medtronic [id number] Protecta   Elevated transaminase level    10/2011  ? amio   GERD (gastroesophageal reflux disease)    HTN (hypertension)    Other primary cardiomyopathies    Syncope    Tubular adenoma of colon    Ventricular tachycardia    storm following ICD implant on amiodarone    Vitreomacular adhesion of  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   Vitreomacular adhesion of right eye 02/15/2021   Past Surgical History:  Procedure Laterality Date   CARDIAC DEFIBRILLATOR PLACEMENT     Medtronic [id number] Protecta   CATARACT EXTRACTION, BILATERAL  2019   CHOLECYSTECTOMY     COLONOSCOPY WITH PROPOFOL N/A 06/30/2017   Procedure: COLONOSCOPY WITH PROPOFOL;  Surgeon: Carol Ada, MD;  Location: [facility];  Service: Endoscopy;  Laterality: N/A;   GALLBLADDER SURGERY     HERNIA  REPAIR     ICD GENERATOR CHANGEOUT N/A 04/26/2017   Procedure: ICD Generator Changeout;  Surgeon: [name], MD;  Location: [facility];  Service: Cardiovascular;  Laterality: N/A;   TONSILLECTOMY AND ADENOIDECTOMY     Allergies  Allergen Reactions   Adhesive [Tape]     Swelling and welts formed   Prior to Admission medications   Medication Sig Start Date End Date Taking? Authorizing Provider  acetaminophen (TYLENOL) 500 MG tablet Take 1,000 mg by mouth daily as needed for moderate pain or headache.   Yes [provider]  amiodarone (PACERONE) 200 MG tablet Take 1/2 (one-half) tablet by mouth once daily 09/23/21  Yes [name], MD  aspirin EC 81 MG tablet Take 1 tablet (81 mg total) by mouth daily. 11/11/11  Yes [name], MD  atorvastatin (LIPITOR) 10 MG tablet Take 1 tablet (10 mg total) by mouth daily. 04/05/21  Yes Wendie Agreste, MD  Cholecalciferol (VITAMIN D) 2000 UNITS tablet Take 2,000 Units by mouth daily.   Yes [provider]  diclofenac Sodium (VOLTAREN) 1 % GEL Apply 4 g topically 4 (four) times daily. Rub into affected area of foot 2 to 4 times daily 05/22/20  Yes McDonald, Stephan Minister, DPM  empagliflozin (JARDIANCE) 25 MG TABS tablet Take 1 tablet (25 mg total) by mouth daily before breakfast. 06/04/21  Yes Wendie Agreste, MD  famotidine (PEPCID) 20 MG tablet Take 1 tablet (20 mg total) by mouth 2 (two) times daily as needed. 04/16/12  Yes Pyrtle, Lajuan Lines, MD  gabapentin (NEURONTIN) 100 MG capsule Take 1 capsule (100 mg total) by mouth at bedtime. 04/05/21  Yes Wendie Agreste, MD  metFORMIN (GLUCOPHAGE) 500 MG tablet Take 1 tablet (500 mg total) by mouth daily with breakfast. 04/05/21  Yes Wendie Agreste, MD  metoprolol succinate (TOPROL-XL) 100 MG 24 hr tablet TAKE 1 TABLET BY MOUTH ONCE DAILY IMMEDIATELY  FOLLOWING  A  MEAL 06/23/21  Yes [name], MD  Molnupiravir 200 MG CAPS Take 4 capsules (800 mg total) by mouth in the morning and  at bedtime. 04/10/21  Yes McGowen, Adrian Blackwater, MD  olmesartan-hydrochlorothiazide (BENICAR HCT) 20-12.5 MG tablet Take 1 tablet by mouth daily. 04/05/21  Yes Wendie Agreste, MD  Omega-3 Fatty Acids (FISH OIL) 1000 MG CAPS Take 1,000 mg by mouth 2 (two) times daily.   Yes [provider]  OVER THE COUNTER MEDICATION    Yes [provider]  tadalafil (CIALIS) 20 MG tablet Take 0.5-1 tablets (10-20 mg total) by mouth daily as needed for erectile dysfunction. 09/15/16  Yes Wendie Agreste, MD  traMADol Veatrice Bourbon) 50 MG tablet  01/08/20  Yes [provider]   Social History   Socioeconomic History   Marital status: Married    Spouse name: Not on file   Number of children: 0   Years of education: Not on file   Highest education level: Bachelor's degree (e.g., BA, AB, BS)  Occupational  History   Occupation: Retired Pharmacist, hospital  Tobacco Use   Smoking status: Never   Smokeless tobacco: Never  Vaping Use   Vaping Use: Never used  Substance and Sexual Activity   Alcohol use: Yes    Alcohol/week: 2.0 standard drinks    Types: 2 Cans of beer per week    Comment: 4/week   Drug use: No   Sexual activity: Yes  Other Topics Concern   Not on file  Social History Narrative   Divorced. Education: [employer]. Exercise: walking 3 times a week for 30 minutes.   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  Eyes:  Negative for visual disturbance.  Respiratory:  Negative for cough, chest tightness and shortness of breath.   Cardiovascular:  Negative for chest pain, palpitations and leg swelling.  Gastrointestinal:  Negative for abdominal pain and blood in  stool.  Neurological:  Negative for dizziness, light-headedness and headaches.    Objective:   Vitals:   11/25/21 1318  BP: (!) 141/69  Pulse: 76  Resp: 18  Temp: 98.4 F (36.9 C)  TempSrc: Temporal  SpO2: 99%  Weight: 186 lb (84.4 kg)  Height: 5\' 5"  (1.651 m)     Physical Exam Vitals reviewed.  Constitutional:      Appearance: He is well-developed.     Comments: Overweight.   HENT:     Head: Normocephalic and atraumatic.  Neck:     Vascular: No carotid bruit or JVD.  Cardiovascular:     Rate and Rhythm: Normal rate and regular rhythm.     Heart sounds: Normal heart sounds. No murmur heard. Pulmonary:     Effort: Pulmonary effort is normal.     Breath sounds: Normal breath sounds. No rales.  Musculoskeletal:     Right lower leg: No edema.     Left lower leg: No edema.  Skin:    General: Skin is warm and dry.  Neurological:     Mental Status: He is alert and oriented to person, place, and time.  Psychiatric:        Mood and Affect: Mood normal.       Assessment & Plan:  Dakota Gutierrez is a 70 y.o. male . Hypertriglyceridemia - Plan: Lipid panel  - diet changes, handout given. Consider fibrate with statin. Check labs, pancreatitis s/sx discussed and ER precautions.   Type 2 diabetes mellitus with hyperglycemia, without long-term current use of insulin (HCC) - Plan: empagliflozin (JARDIANCE) 25 MG TABS tablet, metFORMIN (GLUCOPHAGE) 500 MG tablet, Hemoglobin A1c, Microalbumin / creatinine urine ratio  -Tolerating current regimen, but has not been able to tolerate higher dose of metformin.  We will continue 500 mg daily, continue Jardiance, consider adding Januvia/DPP 4 depending on A1c.  Diet/exercise discussed.  Essential hypertension - Plan: Comprehensive metabolic panel  -Stable on recheck, continue same regimen.  Needs flu shot - Plan: Flu Vaccine QUAD High Dose(Fluad)   Meds ordered this encounter  Medications   empagliflozin (JARDIANCE) 25 MG  TABS tablet    Sig: Take 1 tablet (25 mg total) by mouth daily before breakfast.    Dispense:  90 tablet  Refill:  1   metFORMIN (GLUCOPHAGE) 500 MG tablet    Sig: Take 1 tablet (500 mg total) by mouth daily with breakfast.    Dispense:  90 tablet    Refill:  3   Patient Instructions   Depending on labs, we can add med for triglycerides and diabetes if needed. Watch food choices, walking or other low intensity exercise may help as well.   Thanks for coming in today.   High Triglycerides Eating Plan Triglycerides are a type of fat in the blood. High levels of triglycerides can increase your risk of heart disease and stroke. If your triglyceride levels are high, choosing the right foods can help lower your triglycerides and keep your heart healthy. Work with your health care provider or a dietitian to develop an eating plan that is right for you. What are tips for following this plan? General guidelines  Lose weight, if you are overweight. For most people, losing 5-10 lb (2-5 kg) helps lower triglyceride levels. A weight-loss plan may include: 30 minutes of exercise at least 5 days a week. Reducing the amount of calories, sugar, and fat you eat. Eat a wide variety of fresh fruits, vegetables, and whole grains. These foods are high in fiber. Eat foods that contain healthy fats, such as fatty fish, nuts, seeds, and olive oil. Avoid foods that are high in added sugar, added salt (sodium), and saturated fat. Avoid low-fiber, refined carbohydrates such as white bread, crackers, noodles, and white rice. Avoid foods with trans fats or partially hydrogenated oils, such as fried foods or stick margarine. If you drink alcohol: Limit how much you have to: 0-1 drink a day for women who are not pregnant. 0-2 drinks a day for men. Your health care provider may recommend that you drink less than these amounts depending on your overall health. Know how much alcohol is in a drink. In the [place] one  drink equals one 12 oz bottle of beer (355 mL), one 5 oz glass of wine (148 mL), or one 1 oz glass of hard liquor (44 mL). Reading food labels Check food labels for: The amount of saturated fat. Choose foods with no or very little saturated fat (less than 2 g). The amount of trans fat. Choose foods with no transfat. The amount of cholesterol. Choose foods that are low in cholesterol. The amount of sodium. Choose foods with less than 140 milligrams (mg) per serving. Shopping Buy dairy products labeled as nonfat (skim) or low-fat (1%). Avoid buying processed or prepackaged foods. These are often high in added sugar, sodium, and fat. Cooking Choose healthy fats when cooking, such as olive oil, avocado oil, or canola oil. Cook foods using lower fat methods, such as baking, broiling, boiling, or grilling. Make your own sauces, dressings, and marinades when possible, instead of buying them. Store-bought sauces, dressings, and marinades are often high in sodium and sugar. Meal planning Eat more home-cooked food and less restaurant, buffet, and fast food. Eat fatty fish at least 2 times each week. Examples of fatty fish include salmon, trout, sardines, mackerel, tuna, and herring. If you eat whole eggs, do not eat more than 4 egg yolks per week. What foods should I eat? Fruits All fresh, canned (in natural juice), or frozen fruits. Vegetables Fresh or frozen vegetables. Low-sodium canned vegetables. Grains Whole wheat or whole grain breads, crackers, cereals, and pasta. Unsweetened oatmeal. Bulgur. Barley. Quinoa. Brown rice. Whole wheat flour tortillas. Meats and other proteins Skinless chicken or [place]. Ground  chicken or [place]. Lean cuts of pork, trimmed of fat. Fish and seafood, especially salmon, trout, and herring. Egg whites. Dried beans, peas, or lentils. Unsalted nuts or seeds. Unsalted canned beans. Natural peanut or almond butter or other nut butters. Dairy Low-fat dairy products.  Skim or low-fat (1%) milk. Reduced fat (2%) and low-sodium cheese. Low-fat ricotta cheese. Low-fat cottage cheese. Plain, low-fat yogurt. Fats and oils Tub margarine without trans fats. Light or reduced-fat mayonnaise. Light or reduced-fat salad dressings. Avocado. Safflower, olive, sunflower, soybean, and canola oils. The items listed above may not be a complete list of recommended foods and beverages. Talk with your dietitian about what dietary choices are best for you. What foods should I avoid? Fruits Sweetened dried fruit. Canned fruit in syrup. Fruit juice. Vegetables Creamed or fried vegetables. Vegetables in a cheese sauce. Grains White bread. White (regular) pasta. White rice. Cornbread. Bagels. Pastries. Crackers that contain trans fat. Meats and other proteins Fatty cuts of meat. Ribs. Chicken wings. Berniece Salines. Sausage. Bologna. Salami. Chitterlings. Fatback. Hot dogs. Bratwurst. Packaged lunch meats. Dairy Whole or reduced-fat (2%) milk. Half-and-half. Cream cheese. Full-fat or sweetened yogurt. Full-fat cheese. Nondairy creamers. Whipped toppings. Processed cheese or cheese spreads. Cheese curds. Fats and oils Butter. Stick margarine. Lard. Shortening. Ghee. Bacon fat. Tropical oils, such as coconut, palm kernel, or palm oils. Beverages Alcohol. Sweetened drinks, such as soda, lemonade, fruit drinks, or punches. Sweets and desserts Corn syrup. Sugars. Honey. Molasses. Candy. Jam and jelly. Syrup. Sweetened cereals. Cookies. Pies. Cakes. Donuts. Muffins. Ice cream. Condiments Store-bought sauces, dressings, and marinades that are high in sugar, such as ketchup and barbecue sauce. The items listed above may not be a complete list of foods and beverages you should avoid. Talk with your dietitian about what dietary choices are best for you. Summary High levels of triglycerides can increase the risk of heart disease and stroke. Choosing the right foods can help lower your  triglycerides. Eat plenty of fresh fruits, vegetables, and whole grains. Choose low-fat dairy and lean meats. Eat fatty fish at least twice a week. Avoid processed and prepackaged foods with added sugar, sodium, saturated fat, and trans fat. If you need suggestions or have questions about what types of food are good for you, talk with your health care provider or a dietitian. This information is not intended to replace advice given to you by your health care provider. Make sure you discuss any questions you have with your health care provider. Document Revised: 03/26/2021 Document Reviewed: 03/26/2021 Elsevier Patient Education  2022 [employer].    If you have lab work done today you will be contacted with your lab results within the next 2 weeks.  If you have not heard from [name] then please contact us. The fastest way to get your results is to register for My Chart.   IF you received an x-ray today, you will receive an invoice from [facility]. Please contact [facility] at [phone] with questions or concerns regarding your invoice.   IF you received labwork today, you will receive an invoice from [facility]. Please contact [facility] at [phone] with questions or concerns regarding your invoice.   Our billing staff will not be able to assist you with questions regarding bills from these companies.  You will be contacted with the lab results as soon as they are available. The fastest way to get your results is to activate your My Chart account. Instructions are located on the last page of this paperwork. If you  have not heard from [name] regarding the results in 2 weeks, please contact this office.        Signed,   [name], MD [facility] 11/25/21 1:40 PM

## 2021-11-26 ENCOUNTER — Ambulatory Visit: Payer: Medicare PPO | Admitting: Family Medicine

## 2021-12-08 ENCOUNTER — Ambulatory Visit (INDEPENDENT_AMBULATORY_CARE_PROVIDER_SITE_OTHER): Payer: Medicare PPO

## 2021-12-08 DIAGNOSIS — I428 Other cardiomyopathies: Secondary | ICD-10-CM | POA: Diagnosis not present

## 2021-12-08 LAB — CUP PACEART REMOTE DEVICE CHECK
Battery Remaining Longevity: 35 mo
Battery Voltage: 2.96 V
Brady Statistic AP VP Percent: 5.22 %
Brady Statistic AP VS Percent: 75.19 %
Brady Statistic AS VP Percent: 0.03 %
Brady Statistic AS VS Percent: 19.57 %
Brady Statistic RA Percent Paced: 80.14 %
Brady Statistic RV Percent Paced: 5.19 %
Date Time Interrogation Session: 20230111033428
HighPow Impedance: 42 Ohm
HighPow Impedance: 50 Ohm
Implantable Lead Implant Date: 20120531
Implantable Lead Implant Date: 20120531
Implantable Lead Location: 753859
Implantable Lead Location: 753860
Implantable Lead Model: 4076
Implantable Lead Model: 6947
Implantable Pulse Generator Implant Date: 20180530
Lead Channel Impedance Value: 342 Ohm
Lead Channel Impedance Value: 418 Ohm
Lead Channel Impedance Value: 418 Ohm
Lead Channel Pacing Threshold Amplitude: 0.625 V
Lead Channel Pacing Threshold Amplitude: 0.625 V
Lead Channel Pacing Threshold Pulse Width: 0.4 ms
Lead Channel Pacing Threshold Pulse Width: 0.4 ms
Lead Channel Sensing Intrinsic Amplitude: 12.625 mV
Lead Channel Sensing Intrinsic Amplitude: 12.625 mV
Lead Channel Sensing Intrinsic Amplitude: 2.125 mV
Lead Channel Sensing Intrinsic Amplitude: 2.125 mV
Lead Channel Setting Pacing Amplitude: 1.5 V
Lead Channel Setting Pacing Amplitude: 2 V
Lead Channel Setting Pacing Pulse Width: 0.4 ms
Lead Channel Setting Sensing Sensitivity: 0.3 mV

## 2021-12-17 NOTE — Progress Notes (Signed)
Remote ICD transmission.   

## 2022-01-18 ENCOUNTER — Telehealth: Payer: Self-pay

## 2022-01-18 NOTE — Telephone Encounter (Signed)
-----   Message from Wendie Agreste, MD sent at 01/15/2022  3:30 PM EST ----- Please see my chart message.  I have not heard back from him regarding his preference.

## 2022-01-18 NOTE — Telephone Encounter (Signed)
Okay.  Can discuss at his March 29 visit, consider fructosamine testing at that time.

## 2022-01-18 NOTE — Telephone Encounter (Signed)
Called pt and discussed starting the once weekly injection. Pt has declined at this time and has been working on diet and exercise. And would like to recheck before starting injections   Lab result: Dakota Gutierrez,    Urine test for protein looked okay.  Blood sugar elevated but other electrolytes, kidney, liver tests looked okay.  Triglycerides were still elevated, other cholesterol numbers were stable.  Some of those elevated triglycerides are due to elevated blood sugar as the 62-month blood sugar was still too high at 8.2.  Slightly higher than previous reading in August.  At that level it may be worth trying the once weekly injection as we discussed, or can try additional pill but that may not have quite the impact.  Let me know which route you would like to take.   Dr. Carlota Raspberry

## 2022-01-26 ENCOUNTER — Other Ambulatory Visit: Payer: Self-pay | Admitting: Family Medicine

## 2022-01-26 DIAGNOSIS — E785 Hyperlipidemia, unspecified: Secondary | ICD-10-CM

## 2022-01-26 DIAGNOSIS — E1165 Type 2 diabetes mellitus with hyperglycemia: Secondary | ICD-10-CM

## 2022-01-26 DIAGNOSIS — I10 Essential (primary) hypertension: Secondary | ICD-10-CM

## 2022-02-04 ENCOUNTER — Ambulatory Visit: Payer: Medicare PPO | Admitting: Physician Assistant

## 2022-02-04 ENCOUNTER — Other Ambulatory Visit: Payer: Self-pay

## 2022-02-04 ENCOUNTER — Encounter: Payer: Self-pay | Admitting: Physician Assistant

## 2022-02-04 VITALS — BP 124/50 | HR 77 | Ht 65.0 in | Wt 184.6 lb

## 2022-02-04 DIAGNOSIS — I428 Other cardiomyopathies: Secondary | ICD-10-CM

## 2022-02-04 DIAGNOSIS — I10 Essential (primary) hypertension: Secondary | ICD-10-CM

## 2022-02-04 DIAGNOSIS — I472 Ventricular tachycardia, unspecified: Secondary | ICD-10-CM | POA: Diagnosis not present

## 2022-02-04 DIAGNOSIS — Z95810 Presence of automatic (implantable) cardiac defibrillator: Secondary | ICD-10-CM | POA: Diagnosis not present

## 2022-02-04 DIAGNOSIS — Z79899 Other long term (current) drug therapy: Secondary | ICD-10-CM | POA: Diagnosis not present

## 2022-02-04 LAB — CUP PACEART INCLINIC DEVICE CHECK
Battery Remaining Longevity: 34 mo
Battery Voltage: 2.94 V
Brady Statistic AP VP Percent: 6.05 %
Brady Statistic AP VS Percent: 74.99 %
Brady Statistic AS VP Percent: 0.03 %
Brady Statistic AS VS Percent: 18.93 %
Brady Statistic RA Percent Paced: 80.75 %
Brady Statistic RV Percent Paced: 6 %
Date Time Interrogation Session: 20230310165559
HighPow Impedance: 41 Ohm
HighPow Impedance: 51 Ohm
Implantable Lead Implant Date: 20120531
Implantable Lead Implant Date: 20120531
Implantable Lead Location: 753859
Implantable Lead Location: 753860
Implantable Lead Model: 4076
Implantable Lead Model: 6947
Implantable Pulse Generator Implant Date: 20180530
Lead Channel Impedance Value: 361 Ohm
Lead Channel Impedance Value: 418 Ohm
Lead Channel Impedance Value: 456 Ohm
Lead Channel Pacing Threshold Amplitude: 0.625 V
Lead Channel Pacing Threshold Amplitude: 0.625 V
Lead Channel Pacing Threshold Pulse Width: 0.4 ms
Lead Channel Pacing Threshold Pulse Width: 0.4 ms
Lead Channel Sensing Intrinsic Amplitude: 1.625 mV
Lead Channel Sensing Intrinsic Amplitude: 1.875 mV
Lead Channel Sensing Intrinsic Amplitude: 11.375 mV
Lead Channel Sensing Intrinsic Amplitude: 13.25 mV
Lead Channel Setting Pacing Amplitude: 1.5 V
Lead Channel Setting Pacing Amplitude: 2 V
Lead Channel Setting Pacing Pulse Width: 0.4 ms
Lead Channel Setting Sensing Sensitivity: 0.3 mV

## 2022-02-04 NOTE — Patient Instructions (Signed)
Medication Instructions:  ? ?Your physician recommends that you continue on your current medications as directed. Please refer to the Current Medication list given to you today. ? ?*If you need a refill on your cardiac medications before your next appointment, please call your pharmacy* ? ? ?Lab Work: LFT AND TSH TODAY  ? ? ? ?If you have labs (blood work) drawn today and your tests are completely normal, you will receive your results only by: ?MyChart Message (if you have MyChart) OR ?A paper copy in the mail ?If you have any lab test that is abnormal or we need to change your treatment, we will call you to review the results. ? ? ?Testing/Procedures: NONE ORDERED  TODAY ? ? ? ? ?Follow-Up: ?At [facility], you and your health needs are our priority.  As part of our continuing mission to provide you with exceptional heart care, we have created designated Provider Care Teams.  These Care Teams include your primary Cardiologist (physician) and Advanced Practice Providers (APPs -  Physician Assistants and Nurse Practitioners) who all work together to provide you with the care you need, when you need it. ? ?We recommend signing up for the patient portal called "MyChart".  Sign up information is provided on this After Visit Summary.  MyChart is used to connect with patients for Virtual Visits (Telemedicine).  Patients are able to view lab/test results, encounter notes, upcoming appointments, etc.  Non-urgent messages can be sent to your provider as well.   ?To learn more about what you can do with MyChart, go to [email or web address].   ? ?Your next appointment:   ?6 month(s) ? ?The format for your next appointment:   ?In Person ? ?Provider:   ?You may see Dr. Caryl Comes.Marland Kitchen or one of the following Advanced Practice Providers on your designated Care Team:   ?Tommye Standard, PA-C ?Legrand Como "Oda Kilts, PA-C{ ? ?Other Instructions ? ?

## 2022-02-04 NOTE — Progress Notes (Signed)
Cardiology Office Note Date:  02/04/2022  Patient ID:  Dakota, Gutierrez 22-Jul-1951, MRN [id number] PCP:  Wendie Agreste, MD  Cardiologist:  Dr. Caryl Comes   Chief Complaint:  routine visit  History of Present Illness: Dakota Gutierrez is a 71 y.o. male with history of VT with ICD, HTN, DM, GERD, retinal art occlusion, OSA w/CPAP.  April 2017 had  nonsustained ventricular tachycardia and easily inducible to polymorphic ventricular tachycardia and received an ICD. Unfortunately, in the days that followed he developed VT storm with multiple shocks and was treated with amiodarone which has been gradually down titrated to 100 mg a day.  Amiodarone surveillance had demonstrated elevated liver function tests spring 2013 . Was elected to discontinue the amiodarone albeit with some reluctance on the part of the family. When seen in June of recurrent episodes of ventricular tachycardia 2 of which required therapy. He was then resumed on amiodarone and referred for consultation with GI  most recently 4/17 transaminases were normal".  He was seen by myself in October 2017, his last echo noted mild decrease in LV function and his lopressor changed to Toprol.  He reported feeling pretty well.  He denied any CP, states he gets a discomfort in his chest when he has over eaten, this is chronic and unchanged for years, no palpitations, no SOB.  He denies any dizziness, near syncope or syncope.  His device check that visit noted appropriate tx w/ATP for VT, his Toprol was increased and following w/GI for hx of abn LFTs/amiodarone  He saw Dr. 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  He had ATP x2 for VT, no med changes and planned for gen change.  He saw him Sep 2018, change no VT, concerns of incisional abcess, and rx a course of antibiotics with plans to f/u in 2 weeks.    07/20/2019 he had a monitored VT episode 140bpm,  3 minutes in duration (at least).  I saw him for this He was at the beach, had not had any ETOH that day, would have been just going to bed, but not asleep at the time of the event.  He had no symptoms.  He denies any missed doses of any of his medicines. No medicine changes were made with plans to monitor closely via remotes and in clinic visits.  Dec 2020 he saw Dr. Caryl Comes, the device was reprogrammed to decrease the slope of his ADL rate.  He was volume OL,  started on lasix x5 doases then PRN, planned for updated echo. TTE noted LVEF  60-65%, undetermined diastolic function, no significant VHD  I saw him July 2021 He is doing well.  His back and knee pain limit his ability exercise formally but is active through his day and routinely gets 8-9,000 steps in, reaching 10,000steps on occasion.  He has dieted and lost some weight (out back on a couple), but has gotten his HgbA1C from the 8's to 6.8. No CP, palpitations, or SOB, no DOE.  No near syncope or syncope, no shocks He has not felt like he has been retaining any fluid No changes were made.  Most recently saw Dr. Caryl Comes sept 2022 Activity much reduced 2/2 back injury. Had not had AFib or VT, maintained on low dose amio, and his BB  TODAY He is doing well No CP, palpitations or cardiac awareness Once in a while he has the sense that he  needs to get a good deep breath in, this a brief thing, gives an example when out picking up sticks/limbs, has to stand up grab a deep breath.   No SOB, DOE otherwise. No near syncope or syncope No shocks VP 6%   Device history:  MDT dual chamber ICD, implanted 04/28/11,  gen change may 2018, Dr. Caryl Comes, primary prevention +++ hx of appropriate therapy AAD: amiodarone Amiodarone 2012, Hx of abn LFTs on amiodarone, was stopped and resumed 2/2 recurrent VT >> remains current   Past Medical History:  Diagnosis Date   Anxiety    secondary to ICD shocks   Diabetes mellitus without complication ([place])     Dual implantable cardiac defibrillator in situ    Medtronic D314DRG Protecta   Elevated transaminase level    10/2011  ? 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   Vitreomacular adhesion of right eye 02/15/2021    Past Surgical History:  Procedure Laterality Date   CARDIAC DEFIBRILLATOR PLACEMENT     Medtronic [id number] Protecta   CATARACT EXTRACTION, BILATERAL  2019   CHOLECYSTECTOMY     COLONOSCOPY WITH PROPOFOL N/A 06/30/2017   Procedure: COLONOSCOPY WITH PROPOFOL;  Surgeon: Carol Ada, MD;  Location: [facility];  Service: Endoscopy;  Laterality: N/A;   GALLBLADDER SURGERY     HERNIA REPAIR     ICD GENERATOR CHANGEOUT N/A 04/26/2017   Procedure: ICD Generator Changeout;  Surgeon: [name], MD;  Location: [facility];  Service: Cardiovascular;  Laterality: N/A;   TONSILLECTOMY AND ADENOIDECTOMY      Current Outpatient Medications  Medication Sig Dispense Refill   acetaminophen (TYLENOL) 500 MG tablet Take 1,000 mg by mouth daily as needed for moderate pain or headache.     amiodarone (PACERONE) 200 MG tablet Take 1/2 (one-half) tablet by mouth once daily 45 tablet 3   aspirin EC 81 MG tablet Take 1 tablet (81 mg total) by mouth daily.     atorvastatin (LIPITOR) 10 MG tablet TAKE 1 TABLET BY MOUTH EVERY DAY 90 tablet 2   Cholecalciferol (VITAMIN D) 2000 UNITS tablet Take 2,000 Units by mouth daily.     diclofenac Sodium (VOLTAREN) 1 % GEL Apply 4 g topically 4 (four) times daily. Rub into affected area of foot 2 to 4 times daily 100 g 2   empagliflozin (JARDIANCE) 25 MG TABS tablet Take 1 tablet (25 mg total) by mouth daily before breakfast. 90 tablet 1   famotidine (PEPCID) 20 MG tablet Take 1 tablet (20 mg total) by mouth 2 (two) times daily  as needed. 60 tablet 4   gabapentin (NEURONTIN) 100 MG capsule Take 1 capsule (100 mg total) by mouth at bedtime. 90 capsule 1   metFORMIN (GLUCOPHAGE) 500 MG tablet Take 1 tablet (500 mg total) by mouth daily with breakfast. 90 tablet 3   metoprolol succinate (TOPROL-XL) 100 MG 24 hr tablet TAKE 1 TABLET BY MOUTH ONCE DAILY IMMEDIATELY  FOLLOWING  A  MEAL 90 tablet 2   Molnupiravir 200 MG CAPS Take 4 capsules (800 mg total) by mouth in the morning and at bedtime. 40 capsule 0   olmesartan-hydrochlorothiazide (BENICAR HCT) 20-12.5 MG tablet TAKE 1 TABLET BY MOUTH EVERY DAY 90 tablet 2  Omega-3 Fatty Acids (FISH OIL) 1000 MG CAPS Take 1,000 mg by mouth 2 (two) times daily.     OVER THE COUNTER MEDICATION      tadalafil (CIALIS) 20 MG tablet Take 0.5-1 tablets (10-20 mg total) by mouth daily as needed for erectile dysfunction. 10 tablet 11   traMADol (ULTRAM) 50 MG tablet      No current facility-administered medications for this visit.    Allergies:   Adhesive [tape]   Social History:  The patient  reports that he has never smoked. He has never used smokeless tobacco. He reports current alcohol use of about 2.0 standard drinks per week. He reports that he does not use drugs.   Family History:  The patient's family history includes Heart disease in his father; Hyperlipidemia in his mother.  ROS:  Please see the history of present illness.  All other systems are reviewed and otherwise negative.   PHYSICAL EXAM:  VS:  There were no vitals taken for this visit. BMI: There is no height or weight on file to calculate BMI.  HR with his device check was AP 15's Well nourished, well developed, in no acute distress  HEENT: normocephalic, atraumatic  Neck: no JVD, carotid bruits or masses Cardiac: RRR; no significant murmurs, no rubs, or gallops Lungs:  CTA b/l, no wheezing, rhonchi or rales  Abd: soft, nontender MS: no deformity or atrophy Ext: no edema  Skin: warm and dry, no rash Neuro:   No gross deficits appreciated, known left eye visual field deficit  Psych: euthymic mood, full affect  ICD site is stable, no tethering or discomfort, skin is intact, no fluid collection, fluctuation   EKG:  done today and reviewed by myself AP/VS 77bpm  ICD interrogation today and reviewed by myself: Battery and lead measurement are good 3 NSVT Three episodes of a  1:1 SVT All are brief   12/11/2019: TTE IMPRESSIONS  1. The average left ventricular global longitudinal strain is -19.7 %.   2. Left ventricular ejection fraction, by visual estimation, is 60 to  65%. The left ventricle has normal function. There is no left ventricular  hypertrophy.   3. Left ventricular diastolic parameters are indeterminate.   4. The left ventricle has no regional wall motion abnormalities.   5. Global right ventricle has normal systolic function.The right  ventricular size is mildly enlarged. No increase in right ventricular wall  thickness.   6. Left atrial size was normal.   7. Right atrial size was normal.   8. Mild to moderate mitral annular calcification.   9. The mitral valve is normal in structure. Mild mitral valve  regurgitation. No evidence of mitral stenosis.  10. The tricuspid valve is normal in structure.  11. The aortic valve was not well visualized. Aortic valve regurgitation  is not visualized. Mild to moderate aortic valve sclerosis/calcification  without any evidence of aortic stenosis.  12. The pulmonic valve was normal in structure. Pulmonic valve  regurgitation is not visualized.  13. The inferior vena cava is normal in size with greater than 50%  respiratory variability, suggesting right atrial pressure of 3 mmHg.     04/07/16: TTE Study Conclusions - Left ventricle: The cavity size was mildly dilated. Wall   thickness was normal. Systolic function was mildly reduced. The   estimated ejection fraction was in the range of 45% to 50%.   Diffuse hypokinesis. Doppler  parameters are consistent with   abnormal left ventricular relaxation (grade 1 diastolic  dysfunction). The E/e&' ratio is between 8-15, suggesting   indeterminate LV filling pressure. - Left atrium: The atrium was mildly dilated. - Right ventricle: The cavity size was normal. Pacer wire or   catheter noted in right ventricle. Systolic function is reduced. - Right atrium: The atrium was normal in size. Pacer wire or   catheter noted in right atrium. - Inferior vena cava: The vessel was normal in size. The   respirophasic diameter changes were in the normal range (>= 50%),   consistent with normal central venous pressure. - Pericardium, extracardiac: A trivial pericardial effusion was   identified. Impressions: - LVEF 45-50%, diffuse hypokinesis, normal wall thickness with   dilated LV cavity, diastolic dysfunction, indeterminate LV   filling pressure, mildly dilated LA, RV/RA AICD wires noted,   reduced RV systolic function, trivial pericardial effusion.  Myoview 2012-no ischemia ejection fraction 56%    Recent Labs: 11/25/2021: ALT 23; BUN 15; Creatinine, Ser 0.98; Potassium 4.1; Sodium 138  11/25/2021: Cholesterol 144; Direct LDL 57.0; HDL 33.30; Total CHOL/HDL Ratio 4; Triglycerides 454.0 Triglyceride is over 400; calculations on Lipids are invalid.   CrCl cannot be calculated (Patient's most recent lab result is older than the maximum 21 days allowed.).   Wt Readings from Last 3 Encounters:  11/25/21 186 lb (84.4 kg)  08/05/21 185 lb 9.6 oz (84.2 kg)  06/04/21 186 lb 3.2 oz (84.5 kg)     Other studies reviewed: Additional studies/records reviewed today include: summarized above  ASSESSMENT AND PLAN:  1. VT, polymorphic      On amiodarone     Only a few NSVTs only     Labs today, watch LFTs closely  2. ICD Intact function, no programming changes made   3. 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 HTN        Looks good, no changes    Disposition: remotes as usual, in clinic in 39mo sooner if needed    Current medicines are reviewed at length with the patient today.  The patient did not have any concerns regarding medicines.  SHaywood Lasso PA-C 02/04/2022 5:35 AM     [facility] [address] [place] [phone])  [phone])

## 2022-02-05 LAB — HEPATIC FUNCTION PANEL
ALT: 23 IU/L (ref 0–44)
AST: 27 IU/L (ref 0–40)
Albumin: 4.9 g/dL — ABNORMAL HIGH (ref 3.7–4.7)
Alkaline Phosphatase: 52 IU/L (ref 44–121)
Bilirubin Total: 0.6 mg/dL (ref 0.0–1.2)
Bilirubin, Direct: 0.16 mg/dL (ref 0.00–0.40)
Total Protein: 7.2 g/dL (ref 6.0–8.5)

## 2022-02-05 LAB — TSH: TSH: 3.56 u[IU]/mL (ref 0.450–4.500)

## 2022-02-09 ENCOUNTER — Telehealth: Payer: Self-pay | Admitting: *Deleted

## 2022-02-09 NOTE — Telephone Encounter (Signed)
-----   Message from [place], [place] sent at 02/07/2022  3:18 PM EDT ----- ?Looks OK, no new recommendations, continue amiodarone ?

## 2022-02-09 NOTE — Telephone Encounter (Signed)
Attempted to call patient for results and no voicemail set up. Phones just rings ?

## 2022-02-15 ENCOUNTER — Telehealth: Payer: Medicare PPO

## 2022-02-15 NOTE — Telephone Encounter (Signed)
Patient called in to the office with [employer] on the line. Gershon Mussel says that he was told on Friday by Doroteo Bradford that the reason his copay is coming back as $40 is because he needs to update his PCP with the insurance. [employer] states they do not base any copay off of PCP and do not require members to have a PCP on the card and did not know why this was being asked.  ?

## 2022-02-21 ENCOUNTER — Encounter (INDEPENDENT_AMBULATORY_CARE_PROVIDER_SITE_OTHER): Payer: Medicare PPO | Admitting: Ophthalmology

## 2022-02-23 ENCOUNTER — Ambulatory Visit: Payer: Medicare PPO | Admitting: Family Medicine

## 2022-03-03 ENCOUNTER — Ambulatory Visit (INDEPENDENT_AMBULATORY_CARE_PROVIDER_SITE_OTHER): Payer: Medicare PPO

## 2022-03-03 DIAGNOSIS — Z0000 Encounter for general adult medical examination without abnormal findings: Secondary | ICD-10-CM

## 2022-03-03 NOTE — Progress Notes (Signed)
? ?Subjective:  ? Dakota Gutierrez is a 71 y.o. male who presents for an subsequent  Medicare Annual Wellness Visit. ? ?I connected with Lynk Marti today by telephone and verified that I am speaking with the correct person using two identifiers. ?Location patient: home ?Location provider: work ?Persons participating in the virtual visit: patient, provider. ?  ?I discussed the limitations, risks, security and privacy concerns of performing an evaluation and management service by telephone and the availability of in person appointments. I also discussed with the patient that there may be a patient responsible charge related to this service. The patient expressed understanding and verbally consented to this telephonic visit.  ?  ?Interactive audio and video telecommunications were attempted between this provider and patient, however failed, due to patient having technical difficulties OR patient did not have access to video capability.  We continued and completed visit with audio only. ? ?  ?Review of Systems    ? ?Cardiac Risk Factors include: advanced age (>32mn, >>81women);diabetes mellitus;male gender ? ?   ?Objective:  ?  ?Today's Vitals  ? ?There is no height or weight on file to calculate BMI. ? ? ?  03/03/2022  ?  1:12 PM 06/23/2020  ? 11:47 AM 12/27/2018  ?  2:32 PM 12/26/2017  ?  1:54 PM 06/30/2017  ? 11:58 AM 04/26/2017  ? 10:35 AM 01/27/2015  ?  3:27 PM  ?Advanced Directives  ?Does Patient Have a Medical Advance Directive? Yes Yes Yes Yes Yes Yes Yes  ?Type of AParamedicof ACornishLiving will Living will Living will;Healthcare Power of AJaneOut of facility DNR (pink MOST or yellow form) HPennLiving will HLaughlinLiving will HPrestonLiving will   ?Does patient want to make changes to medical advance directive?  No - Patient declined No - Patient declined   No - Patient declined No - Patient declined  ?Copy of  HBlue Pointin Chart? No - copy requested  No - copy requested No - copy requested No - copy requested No - copy requested   ? ? ?Current Medications (verified) ?Outpatient Encounter Medications as of 03/03/2022  ?Medication Sig  ? acetaminophen (TYLENOL) 500 MG tablet Take 1,000 mg by mouth daily as needed for moderate pain or headache.  ? amiodarone (PACERONE) 200 MG tablet Take 1/2 (one-half) tablet by mouth once daily  ? aspirin EC 81 MG tablet Take 1 tablet (81 mg total) by mouth daily.  ? atorvastatin (LIPITOR) 10 MG tablet TAKE 1 TABLET BY MOUTH EVERY DAY  ? Cholecalciferol (VITAMIN D) 2000 UNITS tablet Take 2,000 Units by mouth daily.  ? empagliflozin (JARDIANCE) 25 MG TABS tablet Take 1 tablet (25 mg total) by mouth daily before breakfast.  ? famotidine (PEPCID) 20 MG tablet Take 1 tablet (20 mg total) by mouth 2 (two) times daily as needed.  ? gabapentin (NEURONTIN) 100 MG capsule Take 1 capsule (100 mg total) by mouth at bedtime.  ? metFORMIN (GLUCOPHAGE) 500 MG tablet Take 1 tablet (500 mg total) by mouth daily with breakfast.  ? metoprolol succinate (TOPROL-XL) 100 MG 24 hr tablet TAKE 1 TABLET BY MOUTH ONCE DAILY IMMEDIATELY  FOLLOWING  A  MEAL  ? olmesartan-hydrochlorothiazide (BENICAR HCT) 20-12.5 MG tablet TAKE 1 TABLET BY MOUTH EVERY DAY  ? Omega-3 Fatty Acids (FISH OIL) 1000 MG CAPS Take 1,000 mg by mouth 2 (two) times daily.  ? tadalafil (CIALIS) 20 MG tablet Take 0.5-1 tablets (  10-20 mg total) by mouth daily as needed for erectile dysfunction.  ? traMADol (ULTRAM) 50 MG tablet   ? diclofenac Sodium (VOLTAREN) 1 % GEL Apply 4 g topically 4 (four) times daily. Rub into affected area of foot 2 to 4 times daily (Patient not taking: Reported on 03/03/2022)  ? OVER THE COUNTER MEDICATION  (Patient not taking: Reported on 03/03/2022)  ? ?No facility-administered encounter medications on file as of 03/03/2022.  ? ? ?Allergies (verified) ?Adhesive [tape]  ? ?History: ?Past Medical History:   ?Diagnosis Date  ? Anxiety   ? secondary to ICD shocks  ? Diabetes mellitus without complication ([place])   ? Dual implantable cardiac defibrillator in situ   ? Medtronic D314DRG Protecta  ? Elevated transaminase level   ? 10/2011  ? amio  ? GERD (gastroesophageal reflux disease)   ? HTN (hypertension)   ? Other primary cardiomyopathies   ? Syncope   ? Tubular adenoma of colon   ? Ventricular tachycardia ([place])   ? storm following ICD implant on amiodarone   ? 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 ?Past Surgical History:  ?Procedure Laterality Date  ? CARDIAC DEFIBRILLATOR PLACEMENT    ? Medtronic D314DRG Protecta  ? CATARACT EXTRACTION, BILATERAL  2019  ? CHOLECYSTECTOMY    ? COLONOSCOPY WITH PROPOFOL N/A 06/30/2017  ? Procedure: COLONOSCOPY WITH PROPOFOL;  Surgeon: Carol Ada, MD;  Location: [facility];  Service: Endoscopy;  Laterality: N/A;  ? GALLBLADDER SURGERY    ? HERNIA REPAIR    ? ICD GENERATOR CHANGEOUT N/A 04/26/2017  ? Procedure: ICD Generator Changeout;  Surgeon: [name], MD;  Location: [facility];  Service: Cardiovascular;  Laterality: N/A;  ? TONSILLECTOMY AND ADENOIDECTOMY    ? ?Family History  ?Problem Relation Age of Onset  ? Hyperlipidemia Mother   ? Heart disease Father   ? Colon cancer Neg Hx   ? Esophageal cancer Neg Hx   ? Stomach cancer Neg Hx   ? Rectal cancer Neg Hx   ? ?Social History  ? ?Socioeconomic History  ? Marital status: Married  ?  Spouse name: Not on file  ? Number of children: 0  ? Years of education: Not on file  ? Highest education level: Bachelor's degree (e.g., BA, AB, BS)  ?Occupational History  ? Occupation: Retired Pharmacist, hospital  ?Tobacco Use  ? Smoking status: Never  ? Smokeless tobacco: Never  ?Vaping Use  ? Vaping Use: Never used  ?Substance and Sexual Activity  ? Alcohol use: Yes  ?  Alcohol/week: 2.0 standard drinks  ?  Types: 2 Cans of beer per week  ?  Comment: 4/week  ? Drug use: No  ? Sexual  activity: Yes  ?Other Topics Concern  ? Not on file  ?Social History Narrative  ? Divorced. Education: [employer]. Exercise: walking 3 times a week for 30 minutes.  ? Retired Pharmacist, hospital  ? ?Social Determinants of Health  ? ?Financial Resource Strain: Low Risk   ? Difficulty of Paying Living Expenses: Not hard at all  ?Food Insecurity: No Food Insecurity  ? Worried About Charity fundraiser in the Last Year: Never true  ? Ran Out of Food in the Last Year: Never true  ?Transportation Needs: No Transportation Needs  ? Lack of Transportation (Medical): No  ? Lack of Transportation (Non-Medical): No  ?Physical Activity: Sufficiently Active  ? Days of Exercise per Week: 4 days  ?  Minutes of Exercise per Session: 50 min  ?Stress: No Stress Concern Present  ? Feeling of Stress : Not at all  ?Social Connections: Moderately Integrated  ? Frequency of Communication with Friends and Family: Twice a week  ? Frequency of Social Gatherings with Friends and Family: Twice a week  ? Attends Religious Services: More than 4 times per year  ? Active Member of Clubs or Organizations: No  ? Attends Archivist Meetings: Never  ? Marital Status: Married  ? ? ?Tobacco Counseling ?Counseling given: Not Answered ? ? ?Clinical Intake: ? ?Pre-visit preparation completed: Yes ? ?Pain : No/denies pain ? ?  ? ?Nutritional Risks: None ?Diabetes: Yes ?CBG done?: No ?Did pt. bring in CBG monitor from home?: No ? ?How often do you need to have someone help you when you read instructions, pamphlets, or other written materials from your doctor or pharmacy?: 1 - Never ?What is the last grade level you completed in school?: college ? ?Diabetic?yes ?Nutrition Risk Assessment: ? ?Has the patient had any N/V/D within the last 2 months?  No  ?Does the patient have any non-healing wounds?  No  ?Has the patient had any unintentional weight loss or weight gain?  No  ? ?Diabetes: ? ?Is the patient diabetic?  Yes  ?If diabetic, was a CBG obtained today?  No   ?Did the patient bring in their glucometer from home?  No  ?How often do you monitor your CBG's? Daily .  ? ?Financial Strains and Diabetes Management: ? ?Are you having any financial strains with the device, your s

## 2022-03-03 NOTE — Telephone Encounter (Signed)
Issue forwarded to director and credentialing for more help ?

## 2022-03-03 NOTE — Patient Instructions (Signed)
Dakota Gutierrez , ?Thank you for taking time to come for your Medicare Wellness Visit. I appreciate your ongoing commitment to your health goals. Please review the following plan we discussed and let me know if I can assist you in the future.  ? ?Screening recommendations/referrals: ?Colonoscopy: 06/30/2017   due 06/2022 ?Recommended yearly ophthalmology/optometry visit for glaucoma screening and checkup ?Recommended yearly dental visit for hygiene and checkup ? ?Vaccinations: ?Influenza vaccine: completed  ?Pneumococcal vaccine: completed  ?Tdap vaccine: 03/21/2013 ?Shingles vaccine: will consider    ? ?Advanced directives: yes  ? ?Conditions/risks identified: none  ? ?Next appointment: none  ? ?Preventive Care 26 Years and Older, Male ?Preventive care refers to lifestyle choices and visits with your health care provider that can promote health and wellness. ?What does preventive care include? ?A yearly physical exam. This is also called an annual well check. ?Dental exams once or twice a year. ?Routine eye exams. Ask your health care provider how often you should have your eyes checked. ?Personal lifestyle choices, including: ?Daily care of your teeth and gums. ?Regular physical activity. ?Eating a healthy diet. ?Avoiding tobacco and drug use. ?Limiting alcohol use. ?Practicing safe sex. ?Taking low doses of aspirin every day. ?Taking vitamin and mineral supplements as recommended by your health care provider. ?What happens during an annual well check? ?The services and screenings done by your health care provider during your annual well check will depend on your age, overall health, lifestyle risk factors, and family history of disease. ?Counseling  ?Your health care provider may ask you questions about your: ?Alcohol use. ?Tobacco use. ?Drug use. ?Emotional well-being. ?Home and relationship well-being. ?Sexual activity. ?Eating habits. ?History of falls. ?Memory and ability to understand (cognition). ?Work and  work Statistician. ?Screening  ?You may have the following tests or measurements: ?Height, weight, and BMI. ?Blood pressure. ?Lipid and cholesterol levels. These may be checked every 5 years, or more frequently if you are over 86 years old. ?Skin check. ?Lung cancer screening. You may have this screening every year starting at age 57 if you have a 30-pack-year history of smoking and currently smoke or have quit within the past 15 years. ?Fecal occult blood test (FOBT) of the stool. You may have this test every year starting at age 21. ?Flexible sigmoidoscopy or colonoscopy. You may have a sigmoidoscopy every 5 years or a colonoscopy every 10 years starting at age 61. ?Prostate cancer screening. Recommendations will vary depending on your family history and other risks. ?Hepatitis C blood test. ?Hepatitis B blood test. ?Sexually transmitted disease (STD) testing. ?Diabetes screening. This is done by checking your blood sugar (glucose) after you have not eaten for a while (fasting). You may have this done every 1-3 years. ?Abdominal aortic aneurysm (AAA) screening. You may need this if you are a current or former smoker. ?Osteoporosis. You may be screened starting at age 48 if you are at high risk. ?Talk with your health care provider about your test results, treatment options, and if necessary, the need for more tests. ?Vaccines  ?Your health care provider may recommend certain vaccines, such as: ?Influenza vaccine. This is recommended every year. ?Tetanus, diphtheria, and acellular pertussis (Tdap, Td) vaccine. You may need a Td booster every 10 years. ?Zoster vaccine. You may need this after age 30. ?Pneumococcal 13-valent conjugate (PCV13) vaccine. One dose is recommended after age 56. ?Pneumococcal polysaccharide (PPSV23) vaccine. One dose is recommended after age 72. ?Talk to your health care provider about which screenings and vaccines you need  and how often you need them. ?This information is not intended to  replace advice given to you by your health care provider. Make sure you discuss any questions you have with your health care provider. ?Document Released: 12/11/2015 Document Revised: 08/03/2016 Document Reviewed: 09/15/2015 ?Elsevier Interactive Patient Education ? 2017 [place]. ? ?Fall Prevention in the Home ?Falls can cause injuries. They can happen to people of all ages. There are many things you can do to make your home safe and to help prevent falls. ?What can I do on the outside of my home? ?Regularly fix the edges of walkways and driveways and fix any cracks. ?Remove anything that might make you trip as you walk through a door, such as a raised step or threshold. ?Trim any bushes or trees on the path to your home. ?Use bright outdoor lighting. ?Clear any walking paths of anything that might make someone trip, such as rocks or tools. ?Regularly check to see if handrails are loose or broken. Make sure that both sides of any steps have handrails. ?Any raised decks and porches should have guardrails on the edges. ?Have any leaves, snow, or ice cleared regularly. ?Use sand or salt on walking paths during winter. ?Clean up any spills in your garage right away. This includes oil or grease spills. ?What can I do in the bathroom? ?Use night lights. ?Install grab bars by the toilet and in the tub and shower. Do not use towel bars as grab bars. ?Use non-skid mats or decals in the tub or shower. ?If you need to sit down in the shower, use a plastic, non-slip stool. ?Keep the floor dry. Clean up any water that spills on the floor as soon as it happens. ?Remove soap buildup in the tub or shower regularly. ?Attach bath mats securely with double-sided non-slip rug tape. ?Do not have throw rugs and other things on the floor that can make you trip. ?What can I do in the bedroom? ?Use night lights. ?Make sure that you have a light by your bed that is easy to reach. ?Do not use any sheets or blankets that are too big for  your bed. They should not hang down onto the floor. ?Have a firm chair that has side arms. You can use this for support while you get dressed. ?Do not have throw rugs and other things on the floor that can make you trip. ?What can I do in the kitchen? ?Clean up any spills right away. ?Avoid walking on wet floors. ?Keep items that you use a lot in easy-to-reach places. ?If you need to reach something above you, use a strong step stool that has a grab bar. ?Keep electrical cords out of the way. ?Do not use floor polish or wax that makes floors slippery. If you must use wax, use non-skid floor wax. ?Do not have throw rugs and other things on the floor that can make you trip. ?What can I do with my stairs? ?Do not leave any items on the stairs. ?Make sure that there are handrails on both sides of the stairs and use them. Fix handrails that are broken or loose. Make sure that handrails are as long as the stairways. ?Check any carpeting to make sure that it is firmly attached to the stairs. Fix any carpet that is loose or worn. ?Avoid having throw rugs at the top or bottom of the stairs. If you do have throw rugs, attach them to the floor with carpet tape. ?Make sure that you  have a light switch at the top of the stairs and the bottom of the stairs. If you do not have them, ask someone to add them for you. ?What else can I do to help prevent falls? ?Wear shoes that: ?Do not have high heels. ?Have rubber bottoms. ?Are comfortable and fit you well. ?Are closed at the toe. Do not wear sandals. ?If you use a stepladder: ?Make sure that it is fully opened. Do not climb a closed stepladder. ?Make sure that both sides of the stepladder are locked into place. ?Ask someone to hold it for you, if possible. ?Clearly mark and make sure that you can see: ?Any grab bars or handrails. ?First and last steps. ?Where the edge of each step is. ?Use tools that help you move around (mobility aids) if they are needed. These  include: ?Canes. ?Walkers. ?Scooters. ?Crutches. ?Turn on the lights when you go into a dark area. Replace any light bulbs as soon as they burn out. ?Set up your furniture so you have a clear path. Avoid moving your furni

## 2022-03-08 ENCOUNTER — Other Ambulatory Visit: Payer: Self-pay | Admitting: Family Medicine

## 2022-03-08 ENCOUNTER — Encounter: Payer: Self-pay | Admitting: Family Medicine

## 2022-03-08 DIAGNOSIS — R208 Other disturbances of skin sensation: Secondary | ICD-10-CM

## 2022-03-09 ENCOUNTER — Ambulatory Visit (INDEPENDENT_AMBULATORY_CARE_PROVIDER_SITE_OTHER): Payer: Medicare PPO

## 2022-03-09 DIAGNOSIS — I428 Other cardiomyopathies: Secondary | ICD-10-CM

## 2022-03-09 LAB — CUP PACEART REMOTE DEVICE CHECK
Battery Remaining Longevity: 31 mo
Battery Voltage: 2.96 V
Brady Statistic AP VP Percent: 8.86 %
Brady Statistic AP VS Percent: 75.67 %
Brady Statistic AS VP Percent: 0.02 %
Brady Statistic AS VS Percent: 15.45 %
Brady Statistic RA Percent Paced: 84.29 %
Brady Statistic RV Percent Paced: 8.81 %
Date Time Interrogation Session: 20230412033322
HighPow Impedance: 38 Ohm
HighPow Impedance: 45 Ohm
Implantable Lead Implant Date: 20120531
Implantable Lead Implant Date: 20120531
Implantable Lead Location: 753859
Implantable Lead Location: 753860
Implantable Lead Model: 4076
Implantable Lead Model: 6947
Implantable Pulse Generator Implant Date: 20180530
Lead Channel Impedance Value: 304 Ohm
Lead Channel Impedance Value: 399 Ohm
Lead Channel Impedance Value: 418 Ohm
Lead Channel Pacing Threshold Amplitude: 0.625 V
Lead Channel Pacing Threshold Amplitude: 0.625 V
Lead Channel Pacing Threshold Pulse Width: 0.4 ms
Lead Channel Pacing Threshold Pulse Width: 0.4 ms
Lead Channel Sensing Intrinsic Amplitude: 1.5 mV
Lead Channel Sensing Intrinsic Amplitude: 1.5 mV
Lead Channel Sensing Intrinsic Amplitude: 10.75 mV
Lead Channel Sensing Intrinsic Amplitude: 10.75 mV
Lead Channel Setting Pacing Amplitude: 1.5 V
Lead Channel Setting Pacing Amplitude: 2 V
Lead Channel Setting Pacing Pulse Width: 0.4 ms
Lead Channel Setting Sensing Sensitivity: 0.3 mV

## 2022-03-09 NOTE — Telephone Encounter (Signed)
Refilled gabapentin, but needs follow up appt - please schedule.  ?

## 2022-03-09 NOTE — Telephone Encounter (Signed)
Patient is requesting a refill of the following medications: ?Requested Prescriptions  ? ?Pending Prescriptions Disp Refills  ? gabapentin (NEURONTIN) 100 MG capsule [Pharmacy Med Name: GABAPENTIN 100 MG CAPSULE] 90 capsule 1  ?  Sig: TAKE 1 CAPSULE BY MOUTH AT BEDTIME.  ? ? ?Date of patient request: 03/09/22 ?Last office visit: 11/25/21 ?Date of last refill: 04/05/21 ?Last refill amount: 90x1R ? ? ?

## 2022-03-24 ENCOUNTER — Other Ambulatory Visit: Payer: Self-pay | Admitting: Internal Medicine

## 2022-03-24 DIAGNOSIS — I472 Ventricular tachycardia, unspecified: Secondary | ICD-10-CM

## 2022-03-24 DIAGNOSIS — I10 Essential (primary) hypertension: Secondary | ICD-10-CM

## 2022-03-25 NOTE — Progress Notes (Signed)
Remote ICD transmission.   

## 2022-04-18 NOTE — Telephone Encounter (Signed)
Patient is calling in asking if Dakota Gutierrez is able to give a call back in regards to this. [employer] has been trying to reach out to [name] and was speaking with kelly. Gershon Mussel would like it know if we are submitting a claim for  06/04/21 and 01/06/21.

## 2022-04-27 NOTE — Telephone Encounter (Signed)
Spoke to patient to better understand situation. Patient's insurance has Dr Carlota Raspberry listed as a specialist and not primary care. Patient is paying his $20 copay when he comes to visits but then an extra $20 is billed to him under the "specialists" copay. One of his visits from 06/04/2021 has already been sent to collections for the extra $20 due to this issue. I let patient know that I am working with Buyer, retail and someone from billing to see what we can do to resolve this. He gave the ok for me to contact him via my chart for updates.

## 2022-04-27 NOTE — Telephone Encounter (Signed)
I have contacted patient via my chart as discussed previously

## 2022-04-28 ENCOUNTER — Other Ambulatory Visit: Payer: Self-pay | Admitting: Family Medicine

## 2022-04-28 DIAGNOSIS — E1165 Type 2 diabetes mellitus with hyperglycemia: Secondary | ICD-10-CM

## 2022-04-28 DIAGNOSIS — R208 Other disturbances of skin sensation: Secondary | ICD-10-CM

## 2022-05-10 ENCOUNTER — Encounter: Payer: Self-pay | Admitting: *Deleted

## 2022-05-10 DIAGNOSIS — Z006 Encounter for examination for normal comparison and control in clinical research program: Secondary | ICD-10-CM

## 2022-05-10 NOTE — Research (Signed)
Message left for Dakota Gutierrez about essence research study. Encouraged him to call back.

## 2022-05-30 ENCOUNTER — Encounter: Payer: Self-pay | Admitting: *Deleted

## 2022-05-30 DIAGNOSIS — Z006 Encounter for examination for normal comparison and control in clinical research program: Secondary | ICD-10-CM

## 2022-05-30 NOTE — Research (Signed)
Spoke with Dakota Gutierrez about Designer, multimedia. He states he would like more information. Emailed him a copy of the consent to read over, and encouraged him to call back with any questions.

## 2022-06-03 ENCOUNTER — Encounter: Payer: Self-pay | Admitting: *Deleted

## 2022-06-03 DIAGNOSIS — Z006 Encounter for examination for normal comparison and control in clinical research program: Secondary | ICD-10-CM

## 2022-06-03 NOTE — Research (Signed)
Message left about essence research. encouraged Mr Smyers to call back.

## 2022-06-08 ENCOUNTER — Ambulatory Visit (INDEPENDENT_AMBULATORY_CARE_PROVIDER_SITE_OTHER): Payer: Medicare PPO

## 2022-06-08 DIAGNOSIS — I428 Other cardiomyopathies: Secondary | ICD-10-CM

## 2022-06-08 LAB — CUP PACEART REMOTE DEVICE CHECK
Battery Remaining Longevity: 30 mo
Battery Voltage: 2.95 V
Brady Statistic AP VP Percent: 5.34 %
Brady Statistic AP VS Percent: 76.72 %
Brady Statistic AS VP Percent: 0.02 %
Brady Statistic AS VS Percent: 17.91 %
Brady Statistic RA Percent Paced: 81.19 %
Brady Statistic RV Percent Paced: 5.24 %
Date Time Interrogation Session: 20230712033427
HighPow Impedance: 39 Ohm
HighPow Impedance: 47 Ohm
Implantable Lead Implant Date: 20120531
Implantable Lead Implant Date: 20120531
Implantable Lead Location: 753859
Implantable Lead Location: 753860
Implantable Lead Model: 4076
Implantable Lead Model: 6947
Implantable Pulse Generator Implant Date: 20180530
Lead Channel Impedance Value: 304 Ohm
Lead Channel Impedance Value: 399 Ohm
Lead Channel Impedance Value: 418 Ohm
Lead Channel Pacing Threshold Amplitude: 0.5 V
Lead Channel Pacing Threshold Amplitude: 0.625 V
Lead Channel Pacing Threshold Pulse Width: 0.4 ms
Lead Channel Pacing Threshold Pulse Width: 0.4 ms
Lead Channel Sensing Intrinsic Amplitude: 1.625 mV
Lead Channel Sensing Intrinsic Amplitude: 1.625 mV
Lead Channel Sensing Intrinsic Amplitude: 12.5 mV
Lead Channel Sensing Intrinsic Amplitude: 12.5 mV
Lead Channel Setting Pacing Amplitude: 1.5 V
Lead Channel Setting Pacing Amplitude: 2 V
Lead Channel Setting Pacing Pulse Width: 0.4 ms
Lead Channel Setting Sensing Sensitivity: 0.3 mV

## 2022-06-28 NOTE — Progress Notes (Signed)
Remote ICD transmission.   

## 2022-09-14 ENCOUNTER — Ambulatory Visit (INDEPENDENT_AMBULATORY_CARE_PROVIDER_SITE_OTHER): Payer: Medicare PPO

## 2022-09-14 DIAGNOSIS — I428 Other cardiomyopathies: Secondary | ICD-10-CM

## 2022-09-14 LAB — CUP PACEART REMOTE DEVICE CHECK
Battery Remaining Longevity: 31 mo
Battery Voltage: 2.94 V
Brady Statistic AP VP Percent: 7.53 %
Brady Statistic AP VS Percent: 80.17 %
Brady Statistic AS VP Percent: 0.02 %
Brady Statistic AS VS Percent: 12.28 %
Brady Statistic RA Percent Paced: 85.84 %
Brady Statistic RV Percent Paced: 7.25 %
Date Time Interrogation Session: 20231018033624
HighPow Impedance: 39 Ohm
HighPow Impedance: 47 Ohm
Implantable Lead Implant Date: 20120531
Implantable Lead Implant Date: 20120531
Implantable Lead Location: 753859
Implantable Lead Location: 753860
Implantable Lead Model: 4076
Implantable Lead Model: 6947
Implantable Pulse Generator Implant Date: 20180530
Lead Channel Impedance Value: 304 Ohm
Lead Channel Impedance Value: 399 Ohm
Lead Channel Impedance Value: 418 Ohm
Lead Channel Pacing Threshold Amplitude: 0.625 V
Lead Channel Pacing Threshold Amplitude: 0.625 V
Lead Channel Pacing Threshold Pulse Width: 0.4 ms
Lead Channel Pacing Threshold Pulse Width: 0.4 ms
Lead Channel Sensing Intrinsic Amplitude: 1 mV
Lead Channel Sensing Intrinsic Amplitude: 1 mV
Lead Channel Sensing Intrinsic Amplitude: 11.125 mV
Lead Channel Sensing Intrinsic Amplitude: 11.125 mV
Lead Channel Setting Pacing Amplitude: 1.5 V
Lead Channel Setting Pacing Amplitude: 2 V
Lead Channel Setting Pacing Pulse Width: 0.4 ms
Lead Channel Setting Sensing Sensitivity: 0.3 mV

## 2022-09-21 ENCOUNTER — Other Ambulatory Visit: Payer: Self-pay | Admitting: Internal Medicine

## 2022-09-26 ENCOUNTER — Telehealth: Payer: Self-pay

## 2022-09-26 MED ORDER — METOPROLOL SUCCINATE ER 50 MG PO TB24: 50.0000 mg | ORAL_TABLET | Freq: Every day | ORAL | 3 refills | Status: DC

## 2022-09-26 NOTE — Telephone Encounter (Signed)
Outreach made to Pt.  Advised Dr. Caryl Comes would like to increase Toprol XL to 100 mg PO AM and 50 mg PO in the PM.  Pt agreeable.  Has upcoming appt to discuss treatment received from device further.  Prescription sent.

## 2022-09-26 NOTE — Telephone Encounter (Signed)
-----   Message from [name], MD sent at 09/25/2022  3:33 PM EDT ----- Remote reviewed. This remote is abnormal for VT treated with ATP occurring in the setting of sinus tach and then with inappropriate therapy for post successful therapy sinus tach  Would he be willing to take another 1/2 of metoprolol at nifht

## 2022-09-26 NOTE — Progress Notes (Unsigned)
Cardiology Office Note Date:  09/26/2022  Patient ID:  Dakota Gutierrez, Dakota Gutierrez 1950/12/28, MRN [id number] PCP:  Wendie Agreste, MD  Cardiologist:  Dr. Caryl Comes   Chief Complaint:  routine visit  History of Present Illness: Dakota Gutierrez is a 71 y.o. male with history of VT with ICD, HTN, DM, GERD, retinal art occlusion, OSA w/CPAP.  April 2017 had  nonsustained ventricular tachycardia and easily inducible to polymorphic ventricular tachycardia and received an ICD. Unfortunately, in the days that followed he developed VT storm with multiple shocks and was treated with amiodarone which has been gradually down titrated to 100 mg a day.  Amiodarone surveillance had demonstrated elevated liver function tests spring 2013 . Was elected to discontinue the amiodarone albeit with some reluctance on the part of the family. When seen in June of recurrent episodes of ventricular tachycardia 2 of which required therapy. He was then resumed on amiodarone and referred for consultation with GI  most recently 4/17 transaminases were normal".  He was seen by myself in October 2017, his last echo noted mild decrease in LV function and his lopressor changed to Toprol.  He reported feeling pretty well.  He denied any CP, states he gets a discomfort in his chest when he has over eaten, this is chronic and unchanged for years, no palpitations, no SOB.  He denies any dizziness, near syncope or syncope.  His device check that visit noted appropriate tx w/ATP for VT, his Toprol was increased and following w/GI for hx of abn LFTs/amiodarone  He saw Dr. 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  He had ATP x2 for VT, no med changes and planned for gen change.  He saw him Sep 2018, change no VT, concerns of incisional abcess, and rx a course of antibiotics with plans to f/u in 2 weeks.    07/20/2019 he had a monitored VT episode 140bpm,  3 minutes in duration (at least).  I saw him for this He was at the beach, had not had any ETOH that day, would have been just going to bed, but not asleep at the time of the event.  He had no symptoms.  He denies any missed doses of any of his medicines. No medicine changes were made with plans to monitor closely via remotes and in clinic visits.  Dec 2020 he saw Dr. Caryl Comes, the device was reprogrammed to decrease the slope of his ADL rate.  He was volume OL,  started on lasix x5 doases then PRN, planned for updated echo. TTE noted LVEF  60-65%, undetermined diastolic function, no significant VHD  I saw him July 2021 He is doing well.  His back and knee pain limit his ability exercise formally but is active through his day and routinely gets 8-9,000 steps in, reaching 10,000steps on occasion.  He has dieted and lost some weight (out back on a couple), but has gotten his HgbA1C from the 8's to 6.8. No CP, palpitations, or SOB, no DOE.  No near syncope or syncope, no shocks He has not felt like he has been retaining any fluid No changes were made.  Most recently saw Dr. Caryl Comes sept 2022 Activity much reduced 2/2 back injury. Had not had AFib or VT, maintained on low dose amio, and his BB  I saw him March 2023 He is doing well No CP, palpitations or cardiac awareness Once in a while he has  the sense that he needs to get a good deep breath in, this a brief thing, gives an example when out picking up sticks/limbs, has to stand up grab a deep breath.   No SOB, DOE otherwise. No near syncope or syncope No shocks VP 6%  Planned for labs  *** treated ATP, ? Appropriate and inappropriate, Dr. Caryl Comes suggested 1/2 tab metop at [facility] *** July?? *** numerous NSVTs *** lytes, update echo, ischemic eval?? *** amio labs, LFTs    Device history:  MDT dual chamber ICD, implanted 04/28/11,  gen change may 2018, Dr. Caryl Comes, primary prevention +++ hx of appropriate therapy AAD: amiodarone Amiodarone  2012, Hx of abn LFTs on amiodarone, was stopped and resumed 2/2 recurrent VT >> remains current   Past Medical History:  Diagnosis Date   Anxiety    secondary to ICD shocks   Diabetes mellitus without complication ([place])    Dual implantable cardiac defibrillator in situ    Medtronic [id number] Protecta   Elevated transaminase level    10/2011  ? 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   Vitreomacular adhesion of right eye 02/15/2021    Past Surgical History:  Procedure Laterality Date   CARDIAC DEFIBRILLATOR PLACEMENT     Medtronic [id number] Protecta   CATARACT EXTRACTION, BILATERAL  2019   CHOLECYSTECTOMY     COLONOSCOPY WITH PROPOFOL N/A 06/30/2017   Procedure: COLONOSCOPY WITH PROPOFOL;  Surgeon: Carol Ada, MD;  Location: [facility];  Service: Endoscopy;  Laterality: N/A;   GALLBLADDER SURGERY     HERNIA REPAIR     ICD GENERATOR CHANGEOUT N/A 04/26/2017   Procedure: ICD Generator Changeout;  Surgeon: [name], MD;  Location: [facility];  Service: Cardiovascular;  Laterality: N/A;   TONSILLECTOMY AND ADENOIDECTOMY      Current Outpatient Medications  Medication Sig Dispense Refill   acetaminophen (TYLENOL) 500 MG tablet Take 1,000 mg by mouth daily as needed for moderate pain or headache.     amiodarone (PACERONE) 200 MG tablet Take 1/2 (one-half) tablet by mouth once daily 45 tablet 1   aspirin EC 81 MG tablet Take 1 tablet (81 mg total) by mouth daily.     atorvastatin (LIPITOR) 10 MG tablet TAKE 1 TABLET BY MOUTH EVERY DAY 90 tablet 2   Cholecalciferol (VITAMIN D) 2000 UNITS tablet Take 2,000 Units by mouth daily.     diclofenac Sodium (VOLTAREN) 1 % GEL Apply 4 g topically 4 (four) times daily. Rub into affected area of foot 2 to 4  times daily (Patient not taking: Reported on 03/03/2022) 100 g 2   famotidine (PEPCID) 20 MG tablet Take 1 tablet (20 mg total) by mouth 2 (two) times daily as needed. 60 tablet 4   gabapentin (NEURONTIN) 100 MG capsule TAKE 1 CAPSULE BY MOUTH EVERYDAY AT BEDTIME 90 capsule 0   JARDIANCE 25 MG TABS tablet TAKE 1 TABLET BY MOUTH DAILY BEFORE BREAKFAST. 90 tablet 1   metFORMIN (GLUCOPHAGE) 500 MG tablet Take 1 tablet (500 mg total) by mouth daily with breakfast. 90 tablet 3   metoprolol succinate (TOPROL-XL) 100 MG 24 hr tablet TAKE 1 TABLET BY MOUTH ONCE DAILY IMMEDIATELY  FOLLOWING  A  MEAL 90 tablet 3  olmesartan-hydrochlorothiazide (BENICAR HCT) 20-12.5 MG tablet TAKE 1 TABLET BY MOUTH EVERY DAY 90 tablet 2   Omega-3 Fatty Acids (FISH OIL) 1000 MG CAPS Take 1,000 mg by mouth 2 (two) times daily.     OVER THE COUNTER MEDICATION  (Patient not taking: Reported on 03/03/2022)     tadalafil (CIALIS) 20 MG tablet Take 0.5-1 tablets (10-20 mg total) by mouth daily as needed for erectile dysfunction. 10 tablet 11   traMADol (ULTRAM) 50 MG tablet      No current facility-administered medications for this visit.    Allergies:   Adhesive [tape]   Social History:  The patient  reports that he has never smoked. He has never used smokeless tobacco. He reports current alcohol use of about 2.0 standard drinks of alcohol per week. He reports that he does not use drugs.   Family History:  The patient's family history includes Heart disease in his father; Hyperlipidemia in his mother.  ROS:  Please see the history of present illness.  All other systems are reviewed and otherwise negative.   PHYSICAL EXAM:  VS:  There were no vitals taken for this visit. BMI: There is no height or weight on file to calculate BMI.  HR with his device check was AP 52's Well nourished, well developed, in no acute distress  HEENT: normocephalic, atraumatic  Neck: no JVD, carotid bruits or masses Cardiac: *** RRR; no significant  murmurs, no rubs, or gallops Lungs:  *** CTA b/l, no wheezing, rhonchi or rales  Abd: soft, nontender MS: no deformity or atrophy Ext: *** no edema  Skin: warm and dry, no rash Neuro:  No gross deficits appreciated, known left eye visual field deficit  Psych: euthymic mood, full affect  *** ICD site is stable, no tethering or discomfort, skin is intact, no fluid collection, fluctuation   EKG:  done today and reviewed by myself ***  ICD interrogation today and reviewed by myself: ***   12/11/2019: TTE IMPRESSIONS  1. The average left ventricular global longitudinal strain is -19.7 %.   2. Left ventricular ejection fraction, by visual estimation, is 60 to  65%. The left ventricle has normal function. There is no left ventricular  hypertrophy.   3. Left ventricular diastolic parameters are indeterminate.   4. The left ventricle has no regional wall motion abnormalities.   5. Global right ventricle has normal systolic function.The right  ventricular size is mildly enlarged. No increase in right ventricular wall  thickness.   6. Left atrial size was normal.   7. Right atrial size was normal.   8. Mild to moderate mitral annular calcification.   9. The mitral valve is normal in structure. Mild mitral valve  regurgitation. No evidence of mitral stenosis.  10. The tricuspid valve is normal in structure.  11. The aortic valve was not well visualized. Aortic valve regurgitation  is not visualized. Mild to moderate aortic valve sclerosis/calcification  without any evidence of aortic stenosis.  12. The pulmonic valve was normal in structure. Pulmonic valve  regurgitation is not visualized.  13. The inferior vena cava is normal in size with greater than 50%  respiratory variability, suggesting right atrial pressure of 3 mmHg.     04/07/16: TTE Study Conclusions - Left ventricle: The cavity size was mildly dilated. Wall   thickness was normal. Systolic function was mildly reduced.  The   estimated ejection fraction was in the range of 45% to 50%.   Diffuse hypokinesis. Doppler parameters are consistent  with   abnormal left ventricular relaxation (grade 1 diastolic   dysfunction). The E/e&' ratio is between 8-15, suggesting   indeterminate LV filling pressure. - Left atrium: The atrium was mildly dilated. - Right ventricle: The cavity size was normal. Pacer wire or   catheter noted in right ventricle. Systolic function is reduced. - Right atrium: The atrium was normal in size. Pacer wire or   catheter noted in right atrium. - Inferior vena cava: The vessel was normal in size. The   respirophasic diameter changes were in the normal range (>= 50%),   consistent with normal central venous pressure. - Pericardium, extracardiac: A trivial pericardial effusion was   identified. Impressions: - LVEF 45-50%, diffuse hypokinesis, normal wall thickness with   dilated LV cavity, diastolic dysfunction, indeterminate LV   filling pressure, mildly dilated LA, RV/RA AICD wires noted,   reduced RV systolic function, trivial pericardial effusion.  Myoview 2012-no ischemia ejection fraction 56%    Recent Labs: 11/25/2021: BUN 15; Creatinine, Ser 0.98; Potassium 4.1; Sodium 138 02/04/2022: ALT 23; TSH 3.560  11/25/2021: Cholesterol 144; Direct LDL 57.0; HDL 33.30; Total CHOL/HDL Ratio 4; Triglycerides 454.0 Triglyceride is over 400; calculations on Lipids are invalid.   CrCl cannot be calculated (Patient's most recent lab result is older than the maximum 21 days allowed.).   Wt Readings from Last 3 Encounters:  02/04/22 184 lb 9.6 oz (83.7 kg)  11/25/21 186 lb (84.4 kg)  08/05/21 185 lb 9.6 oz (84.2 kg)     Other studies reviewed: Additional studies/records reviewed today include: summarized above  ASSESSMENT AND PLAN:  1. VT, polymorphic      *** On amiodarone     *** Labs today, watch LFTs closely  2. ICD *** Intact function, no programming changes made   3.  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 HTN        *** Looks good, no changes    Disposition: ***     Current medicines are reviewed at length with the patient today.  The patient did not have any concerns regarding medicines.  Dakota Lasso, Dakota Gutierrez 09/26/2022 1:08 PM     [address] [place] [address] [phone] (office)  [phone] (fax)

## 2022-09-27 NOTE — Progress Notes (Signed)
Remote ICD transmission.   

## 2022-09-28 ENCOUNTER — Ambulatory Visit: Payer: Medicare PPO | Attending: Physician Assistant | Admitting: Physician Assistant

## 2022-09-28 ENCOUNTER — Encounter: Payer: Self-pay | Admitting: Physician Assistant

## 2022-09-28 VITALS — BP 150/80 | HR 76 | Ht 65.0 in | Wt 187.0 lb

## 2022-09-28 DIAGNOSIS — I428 Other cardiomyopathies: Secondary | ICD-10-CM

## 2022-09-28 DIAGNOSIS — I472 Ventricular tachycardia, unspecified: Secondary | ICD-10-CM | POA: Diagnosis not present

## 2022-09-28 DIAGNOSIS — Z95810 Presence of automatic (implantable) cardiac defibrillator: Secondary | ICD-10-CM

## 2022-09-28 DIAGNOSIS — I5030 Unspecified diastolic (congestive) heart failure: Secondary | ICD-10-CM

## 2022-09-28 DIAGNOSIS — I10 Essential (primary) hypertension: Secondary | ICD-10-CM | POA: Diagnosis not present

## 2022-09-28 LAB — CUP PACEART INCLINIC DEVICE CHECK
Battery Remaining Longevity: 31 mo
Battery Voltage: 2.93 V
Brady Statistic AP VP Percent: 6.87 %
Brady Statistic AP VS Percent: 77.81 %
Brady Statistic AS VP Percent: 0.02 %
Brady Statistic AS VS Percent: 15.3 %
Brady Statistic RA Percent Paced: 83.54 %
Brady Statistic RV Percent Paced: 6.71 %
Date Time Interrogation Session: 20231101124827
HighPow Impedance: 41 Ohm
HighPow Impedance: 41 Ohm
HighPow Impedance: 50 Ohm
HighPow Impedance: 50 Ohm
Implantable Lead Connection Status: 753985
Implantable Lead Connection Status: 753985
Implantable Lead Implant Date: 20120531
Implantable Lead Implant Date: 20120531
Implantable Lead Location: 753859
Implantable Lead Location: 753860
Implantable Lead Model: 4076
Implantable Lead Model: 6947
Implantable Pulse Generator Implant Date: 20180530
Lead Channel Impedance Value: 342 Ohm
Lead Channel Impedance Value: 342 Ohm
Lead Channel Impedance Value: 418 Ohm
Lead Channel Impedance Value: 418 Ohm
Lead Channel Impedance Value: 418 Ohm
Lead Channel Impedance Value: 418 Ohm
Lead Channel Pacing Threshold Amplitude: 0.5 V
Lead Channel Pacing Threshold Amplitude: 0.5 V
Lead Channel Pacing Threshold Amplitude: 0.625 V
Lead Channel Pacing Threshold Amplitude: 0.625 V
Lead Channel Pacing Threshold Pulse Width: 0.4 ms
Lead Channel Pacing Threshold Pulse Width: 0.4 ms
Lead Channel Pacing Threshold Pulse Width: 0.4 ms
Lead Channel Pacing Threshold Pulse Width: 0.4 ms
Lead Channel Sensing Intrinsic Amplitude: 1.625 mV
Lead Channel Sensing Intrinsic Amplitude: 1.625 mV
Lead Channel Sensing Intrinsic Amplitude: 1.625 mV
Lead Channel Sensing Intrinsic Amplitude: 1.625 mV
Lead Channel Sensing Intrinsic Amplitude: 12.625 mV
Lead Channel Sensing Intrinsic Amplitude: 12.625 mV
Lead Channel Sensing Intrinsic Amplitude: 13 mV
Lead Channel Sensing Intrinsic Amplitude: 13 mV
Lead Channel Setting Pacing Amplitude: 1.5 V
Lead Channel Setting Pacing Amplitude: 2 V
Lead Channel Setting Pacing Pulse Width: 0.4 ms
Lead Channel Setting Sensing Sensitivity: 0.3 mV
Zone Setting Status: 755011
Zone Setting Status: 755011

## 2022-09-28 NOTE — Patient Instructions (Signed)
Medication Instructions:   Your physician recommends that you continue on your current medications as directed. Please refer to the Current Medication list given to you today.  *If you need a refill on your cardiac medications before your next appointment, please call your pharmacy*   Lab Work:  CMET MAG CBC AND TSH TODAY   If you have labs (blood work) drawn today and your tests are completely normal, you will receive your results only by: [place] (if you have MyChart) OR A paper copy in the mail If you have any lab test that is abnormal or we need to change your treatment, we will call you to review the results.   Testing/Procedures: Your physician has requested that you have an echocardiogram. Echocardiography is a painless test that uses sound waves to create images of your heart. It provides your doctor with information about the size and shape of your heart and how well your heart's chambers and valves are working. This procedure takes approximately one hour. There are no restrictions for this procedure. Please do NOT wear cologne, perfume, aftershave, or lotions (deodorant is allowed). Please arrive 15 minutes prior to your appointment time.     Follow-Up: At [facility], you and your health needs are our priority.  As part of our continuing mission to provide you with exceptional heart care, we have created designated Provider Care Teams.  These Care Teams include your primary Cardiologist (physician) and Advanced Practice Providers (APPs -  Physician Assistants and Nurse Practitioners) who all work together to provide you with the care you need, when you need it.  We recommend signing up for the patient portal called "MyChart".  Sign up information is provided on this After Visit Summary.  MyChart is used to connect with patients for Virtual Visits (Telemedicine).  Patients are able to view lab/test results, encounter notes, upcoming appointments, etc.  Non-urgent  messages can be sent to your provider as well.   To learn more about what you can do with MyChart, go to [email or web address].    Your next appointment:   4 month(s)  The format for your next appointment:   In Person  Provider:   You may see  Dr. Caryl Comes or one of the following Advanced Practice Providers on your designated Care Team:   Tommye Standard, [place] Legrand Como "Jonni Sanger" Chalmers Cater, [place]  Other Instructions   Important Information About Sugar

## 2022-09-29 ENCOUNTER — Other Ambulatory Visit (INDEPENDENT_AMBULATORY_CARE_PROVIDER_SITE_OTHER): Payer: Medicare PPO | Admitting: Internal Medicine

## 2022-09-29 DIAGNOSIS — I472 Ventricular tachycardia, unspecified: Secondary | ICD-10-CM

## 2022-09-29 LAB — CBC
Hematocrit: 43 % (ref 37.5–51.0)
Hemoglobin: 14.5 g/dL (ref 13.0–17.7)
MCH: 29.7 pg (ref 26.6–33.0)
MCHC: 33.7 g/dL (ref 31.5–35.7)
MCV: 88 fL (ref 79–97)
Platelets: 172 10*3/uL (ref 150–450)
RBC: 4.89 x10E6/uL (ref 4.14–5.80)
RDW: 12.9 % (ref 11.6–15.4)
WBC: 5.3 10*3/uL (ref 3.4–10.8)

## 2022-09-29 LAB — COMPREHENSIVE METABOLIC PANEL
ALT: 24 IU/L (ref 0–44)
AST: 20 IU/L (ref 0–40)
Albumin/Globulin Ratio: 1.9 (ref 1.2–2.2)
Albumin: 4.7 g/dL (ref 3.8–4.8)
Alkaline Phosphatase: 52 IU/L (ref 44–121)
BUN/Creatinine Ratio: 19 (ref 10–24)
BUN: 19 mg/dL (ref 8–27)
Bilirubin Total: 0.6 mg/dL (ref 0.0–1.2)
CO2: 25 mmol/L (ref 20–29)
Calcium: 9.5 mg/dL (ref 8.6–10.2)
Chloride: 100 mmol/L (ref 96–106)
Creatinine, Ser: 1.01 mg/dL (ref 0.76–1.27)
Globulin, Total: 2.5 g/dL (ref 1.5–4.5)
Glucose: 190 mg/dL — ABNORMAL HIGH (ref 70–99)
Potassium: 4.5 mmol/L (ref 3.5–5.2)
Sodium: 138 mmol/L (ref 134–144)
Total Protein: 7.2 g/dL (ref 6.0–8.5)
eGFR: 79 mL/min/{1.73_m2} (ref >59)

## 2022-09-29 LAB — TSH: TSH: 4.14 u[IU]/mL (ref 0.450–4.500)

## 2022-09-29 LAB — MAGNESIUM: Magnesium: 2.2 mg/dL (ref 1.6–2.3)

## 2022-10-03 DIAGNOSIS — E119 Type 2 diabetes mellitus without complications: Secondary | ICD-10-CM | POA: Diagnosis not present

## 2022-10-03 LAB — HM DIABETES EYE EXAM

## 2022-10-10 ENCOUNTER — Ambulatory Visit (HOSPITAL_COMMUNITY): Payer: Medicare PPO | Attending: Physician Assistant

## 2022-10-10 DIAGNOSIS — E119 Type 2 diabetes mellitus without complications: Secondary | ICD-10-CM | POA: Insufficient documentation

## 2022-10-10 DIAGNOSIS — I472 Ventricular tachycardia, unspecified: Secondary | ICD-10-CM | POA: Diagnosis not present

## 2022-10-10 DIAGNOSIS — I10 Essential (primary) hypertension: Secondary | ICD-10-CM | POA: Diagnosis not present

## 2022-10-10 LAB — ECHOCARDIOGRAM COMPLETE
Area-P 1/2: 3.54 cm2
S' Lateral: 3 cm

## 2022-10-11 ENCOUNTER — Encounter: Payer: Self-pay | Admitting: Family Medicine

## 2022-10-14 NOTE — Addendum Note (Signed)
Addended by: Gaetano Net on: 10/14/2022 12:50 PM   Modules accepted: Orders

## 2022-10-21 ENCOUNTER — Other Ambulatory Visit: Payer: Self-pay | Admitting: Family Medicine

## 2022-10-21 DIAGNOSIS — E785 Hyperlipidemia, unspecified: Secondary | ICD-10-CM

## 2022-10-21 DIAGNOSIS — E1165 Type 2 diabetes mellitus with hyperglycemia: Secondary | ICD-10-CM

## 2022-10-21 DIAGNOSIS — I10 Essential (primary) hypertension: Secondary | ICD-10-CM

## 2022-11-17 ENCOUNTER — Ambulatory Visit (INDEPENDENT_AMBULATORY_CARE_PROVIDER_SITE_OTHER): Payer: Medicare PPO | Admitting: Family Medicine

## 2022-11-17 ENCOUNTER — Encounter: Payer: Self-pay | Admitting: Family Medicine

## 2022-11-17 VITALS — BP 134/70 | HR 76 | Temp 98.2°F | Ht 65.0 in | Wt 187.4 lb

## 2022-11-17 DIAGNOSIS — E1165 Type 2 diabetes mellitus with hyperglycemia: Secondary | ICD-10-CM

## 2022-11-17 DIAGNOSIS — E785 Hyperlipidemia, unspecified: Secondary | ICD-10-CM

## 2022-11-17 DIAGNOSIS — I10 Essential (primary) hypertension: Secondary | ICD-10-CM | POA: Diagnosis not present

## 2022-11-17 DIAGNOSIS — R208 Other disturbances of skin sensation: Secondary | ICD-10-CM

## 2022-11-17 LAB — HEMOGLOBIN A1C: Hgb A1c MFr Bld: 8.4 % — ABNORMAL HIGH (ref 4.6–6.5)

## 2022-11-17 LAB — LIPID PANEL
Cholesterol: 119 mg/dL (ref 0–200)
HDL: 30.1 mg/dL — ABNORMAL LOW (ref >39.00)
Total CHOL/HDL Ratio: 4
Triglycerides: 505 mg/dL — ABNORMAL HIGH (ref 0.0–149.0)

## 2022-11-17 LAB — MICROALBUMIN / CREATININE URINE RATIO
Creatinine,U: 62.4 mg/dL
Microalb Creat Ratio: 1.4 mg/g (ref 0.0–30.0)
Microalb, Ur: 0.9 mg/dL (ref 0.0–1.9)

## 2022-11-17 LAB — LDL CHOLESTEROL, DIRECT: Direct LDL: 35 mg/dL

## 2022-11-17 MED ORDER — GABAPENTIN 100 MG PO CAPS: ORAL_CAPSULE | ORAL | 0 refills | Status: DC

## 2022-11-17 MED ORDER — METFORMIN HCL 500 MG PO TABS: 500.0000 mg | ORAL_TABLET | Freq: Every day | ORAL | 3 refills | Status: DC

## 2022-11-17 MED ORDER — EMPAGLIFLOZIN 25 MG PO TABS: 25.0000 mg | ORAL_TABLET | Freq: Every day | ORAL | 1 refills | Status: DC

## 2022-11-17 NOTE — Progress Notes (Signed)
Subjective:  Patient ID: Dakota Gutierrez, male    DOB: Jan 06, 1951  Age: 71 y.o. MRN: [id number]  CC:  Chief Complaint  Patient presents with   Hypertension   Hyperlipidemia   Diabetes    Pt states all is well    HPI Dakota Gutierrez presents for   Hypertension: With history of ventricular tachycardia, , Has ICD, cardiology visit November 1.  Also history of OSA on CPAP, retinal artery occlusion.  Chronically on amiodarone - '100mg'$  1/2 qd, and metoprolol was recently increased for recurrent VT/ATP.  Recovery of EF by echo in 2021 was mild and ICM, HFpEF.  No sign of fluid overload at his last cardiology visit. Also on Benicar HCT 20/12.'5mg'$  in addition to his metoprolol, amiodarone. On toprol '100mg'$  in am, '50mg'$  in evening No new side effects of meds.  Home readings: BP Readings from Last 3 Encounters:  11/17/22 134/70  09/28/22 (!) 150/80  02/04/22 (!) 124/50   Lab Results  Component Value Date   CREATININE 1.01 09/28/2022    Hyperlipidemia: Lipitor 10 mg daily, fish oil supplement daily. No new myalgias/side effects.  Lab Results  Component Value Date   CHOL 144 11/25/2021   HDL 33.30 (L) 11/25/2021   LDLCALC 54 05/13/2020   LDLDIRECT 57.0 11/25/2021   TRIG (H) 11/25/2021    454.0 Triglyceride is over 400; calculations on Lipids are invalid.   CHOLHDL 4 11/25/2021   Lab Results  Component Value Date   ALT 24 09/28/2022   AST 20 09/28/2022   ALKPHOS 52 09/28/2022   BILITOT 0.6 09/28/2022   Diabetes: With hyperglycemia.  Last visit in December 2022 - some difficulty with billing issue.  Treated with metformin '500mg'$  QAM - doing ok, rare diarrhea. Jardiance 25 mg qd. No groin rash or urinary symptoms.   Daily dosing of metformin due to difficulty with twice daily dosing.  He is on statin, ARB. GLP1 discussed  or possible DPP4.  Would like to see lab results first, plans improved diet as not as adherent this year.  Gabapentin working ok.  Glucose 190 on recent  labs. Home readings fasting 150-170.  Random 160-180. No 200's or lows.  Microalbumin: nl 11/25/21.  Optho, foot exam, pneumovax: up to date.  Colonoscopy 2018, Dr. Benson Norway, repeat 5 yrs.  Covid booster -  plans at pharmacy,.  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   Posterior vitreous detachment, both eyes 02/15/2021   Optic atrophy, 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   Hypertension associated with type 2 diabetes mellitus ([place]) 11/14/2019   Type II diabetes mellitus ([place]) 01/16/2015   Hypersomnolence 53/29/9242   History of Helicobacter pylori infection 04/16/2012   GERD (gastroesophageal reflux disease) 04/16/2012   Elevated transaminase level 02/02/2012   Obesity 12/29/2011   Dual implantable cardioverter-defibrillator - Medtronic 09/06/2011   Syncope    Ventricular tachycardia (HCC)    Anxiety    Past Medical History:  Diagnosis Date   Anxiety    secondary to ICD shocks   Diabetes mellitus without complication ([place])    Dual implantable cardiac defibrillator in situ    Medtronic [id number]  Protecta   Elevated transaminase level    10/2011  ? 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   Past Surgical History:  Procedure Laterality Date   CARDIAC DEFIBRILLATOR PLACEMENT     Medtronic [id number] Protecta   CATARACT EXTRACTION, BILATERAL  2019   CHOLECYSTECTOMY     COLONOSCOPY WITH  PROPOFOL N/A 06/30/2017   Procedure: COLONOSCOPY WITH PROPOFOL;  Surgeon: Carol Ada, MD;  Location: [facility];  Service: Endoscopy;  Laterality: N/A;   GALLBLADDER SURGERY     HERNIA REPAIR     ICD GENERATOR CHANGEOUT N/A 04/26/2017   Procedure: ICD Generator Changeout;  Surgeon: [name], MD;  Location: [facility];  Service: Cardiovascular;  Laterality: N/A;   TONSILLECTOMY AND ADENOIDECTOMY     Allergies  Allergen Reactions   Adhesive [Tape]     Swelling and welts formed   Prior to Admission medications   Medication Sig Start Date End Date Taking? Authorizing Provider  acetaminophen (TYLENOL) 500 MG tablet Take 1,000 mg by mouth daily as needed for moderate pain or headache.   Yes [provider]  amiodarone (PACERONE) 200 MG tablet Take 1/2 (one-half) tablet by mouth once daily 09/21/22  Yes [name], MD  aspirin EC 81 MG tablet Take 1 tablet (81 mg total) by mouth daily. 11/11/11  Yes [name], MD  atorvastatin (LIPITOR) 10 MG tablet TAKE 1 TABLET BY MOUTH EVERY DAY 10/21/22  Yes Wendie Agreste, MD  Cholecalciferol (VITAMIN D) 2000 UNITS tablet Take 2,000 Units by mouth daily.   Yes [provider]  famotidine (PEPCID) 20 MG tablet Take 1 tablet (20 mg total) by mouth 2 (two) times daily as needed. 04/16/12  Yes Pyrtle, Lajuan Lines, MD  gabapentin (NEURONTIN) 100 MG capsule TAKE 1 CAPSULE BY MOUTH EVERYDAY AT BEDTIME 04/28/22  Yes Wendie Agreste, MD  JARDIANCE 25 MG TABS tablet TAKE 1 TABLET BY MOUTH DAILY BEFORE BREAKFAST. 04/28/22  Yes Wendie Agreste, MD  metFORMIN (GLUCOPHAGE) 500 MG tablet Take 1 tablet (500 mg total) by mouth daily with breakfast. 11/25/21  Yes Wendie Agreste, MD  metoprolol succinate (TOPROL XL) 50 MG 24 hr tablet Take 1 tablet (50 mg total) by mouth daily. Take in addition to Toprol XL 100 mg.  100 mg in AM and 50 mg in PM PO. 09/26/22  Yes [name], MD  metoprolol succinate (TOPROL-XL) 100 MG 24 hr tablet  TAKE 1 TABLET BY MOUTH ONCE DAILY IMMEDIATELY  FOLLOWING  A  MEAL 03/25/22  Yes [name], MD  olmesartan-hydrochlorothiazide (BENICAR HCT) 20-12.5 MG tablet TAKE 1 TABLET BY MOUTH EVERY DAY 10/21/22  Yes Wendie Agreste, MD  Omega-3 Fatty Acids (FISH OIL) 1000 MG CAPS Take 1,000 mg by mouth 2 (two) times daily.   Yes [provider]  tadalafil (CIALIS) 20 MG tablet Take 0.5-1 tablets (10-20 mg total) by mouth daily as needed for erectile dysfunction. 09/15/16  Yes Wendie Agreste, MD   Social History   Socioeconomic History   Marital status: Married    Spouse name: Not on file   Number of children: 0   Years of education:  Not on file   Highest education level: Bachelor's degree (e.g., BA, AB, BS)  Occupational History   Occupation: Retired Pharmacist, hospital  Tobacco Use   Smoking status: Never   Smokeless tobacco: Never  Vaping Use   Vaping Use: Never used  Substance and Sexual Activity   Alcohol use: Yes    Alcohol/week: 2.0 standard drinks of alcohol    Types: 2 Cans of beer per week    Comment: 4/week   Drug use: No   Sexual activity: Yes  Other Topics Concern   Not on file  Social History Narrative   Divorced. Education: [employer]. Exercise: walking 3 times a week for 30 minutes.   Retired Pharmacist, hospital   Social Determinants of Radio broadcast assistant Strain: [place]  (03/03/2022)   Overall Financial Resource Strain (CARDIA)    Difficulty of Paying Living Expenses: Not hard at all  Food Insecurity: No Food Insecurity (03/03/2022)   Hunger Vital Sign    Worried About Running Out of Food in the Last Year: Never true    Ran Out of Food in the Last Year: Never true  Transportation Needs: No Transportation Needs (03/03/2022)   PRAPARE - Hydrologist (Medical): No    Lack of Transportation (Non-Medical): No  Physical Activity: Sufficiently Active (03/03/2022)   Exercise Vital Sign    Days of Exercise per Week: 4 days    Minutes of Exercise per  Session: 50 min  Stress: No Stress Concern Present (03/03/2022)   [place]    Feeling of Stress : Not at all  Social Connections: Moderately Integrated (03/03/2022)   Social Connection and Isolation Panel [NHANES]    Frequency of Communication with Friends and Family: Twice a week    Frequency of Social Gatherings with Friends and Family: Twice a week    Attends Religious Services: More than 4 times per year    Active Member of [employer] or Organizations: No    Attends Archivist Meetings: Never    Marital Status: Married  Human resources officer Violence: Not At Risk (03/03/2022)   Humiliation, Afraid, Rape, and Kick questionnaire    Fear of Current or Ex-Partner: No    Emotionally Abused: No    Physically Abused: No    Sexually Abused: No    Review of Systems  Constitutional:  Negative for fatigue and unexpected weight change.  Eyes:  Negative for visual disturbance.  Respiratory:  Negative for cough, chest tightness and shortness of breath.   Cardiovascular:  Negative for chest pain, palpitations and leg swelling.  Gastrointestinal:  Negative for abdominal pain and blood in stool.  Neurological:  Negative for dizziness, light-headedness and headaches.     Objective:   Vitals:   11/17/22 1108  BP: 134/70  Pulse: 76  Temp: 98.2 F (36.8 C)  SpO2: 98%  Weight: 187 lb 6.4 oz (85 kg)  Height: '5\' 5"'$  (1.651 m)     Physical Exam Vitals reviewed.  Constitutional:      Appearance: He is well-developed.  HENT:     Head: Normocephalic and atraumatic.  Neck:     Vascular: No carotid bruit or JVD.  Cardiovascular:     Rate and Rhythm: Normal rate and regular rhythm.     Heart sounds: Normal heart sounds. No murmur heard. Pulmonary:     Effort: Pulmonary effort is normal.     Breath sounds: Normal breath sounds. No rales.  Musculoskeletal:  Right lower leg: No edema.     Left lower leg: No edema.  Skin:     General: Skin is warm and dry.  Neurological:     Mental Status: He is alert and oriented to person, place, and time.  Psychiatric:        Mood and Affect: Mood normal.        Assessment & Plan:  Dakota Gutierrez is a 71 y.o. male . Essential hypertension  Burning sensation of foot - Plan: gabapentin (NEURONTIN) 100 MG capsule  Type 2 diabetes mellitus with hyperglycemia, without long-term current use of insulin (HCC) - Plan: empagliflozin (JARDIANCE) 25 MG TABS tablet, Lipid panel, Hemoglobin A1c, Microalbumin / creatinine urine ratio, metFORMIN (GLUCOPHAGE) 500 MG tablet  Hyperlipidemia, unspecified hyperlipidemia type  Consider addition of GLP-1 or DPP 4 based on A1c results.  Intolerant to higher doses of metformin.  Regimen, continue same for diabetes.  Overall stable neuropathy, continue gabapentin same dose.  Hypertension stable, cardiology eval noted.  Appears euvolemic.  No med changes.  Continue same dose Lipitor for hyperlipidemia, stable.  Recommended follow-up with gastroenterology regarding colonoscopy.  Meds ordered this encounter  Medications   gabapentin (NEURONTIN) 100 MG capsule    Sig: TAKE 1 CAPSULE BY MOUTH EVERYDAY AT BEDTIME    Dispense:  90 capsule    Refill:  0    Patient must make a follow up visit to continue medications   empagliflozin (JARDIANCE) 25 MG TABS tablet    Sig: Take 1 tablet (25 mg total) by mouth daily before breakfast.    Dispense:  90 tablet    Refill:  1   metFORMIN (GLUCOPHAGE) 500 MG tablet    Sig: Take 1 tablet (500 mg total) by mouth daily with breakfast.    Dispense:  90 tablet    Refill:  3   Patient Instructions  Covid and rsv vaccines can be given at your [email or web address] Call Dr. Benson Norway to schedule colonoscopy Depending on labs today, may need to add diabetes med. Thanks for coming in today.       Signed,   [name], MD [place],  [facility] 11/17/22 1:07 PM

## 2022-11-17 NOTE — Patient Instructions (Addendum)
Covid and rsv vaccines can be given at your [email or web address] Call Dr. Benson Norway to schedule colonoscopy Depending on labs today, may need to add diabetes med. Thanks for coming in today.

## 2022-12-14 ENCOUNTER — Ambulatory Visit: Payer: Medicare PPO | Attending: Internal Medicine

## 2022-12-14 DIAGNOSIS — I428 Other cardiomyopathies: Secondary | ICD-10-CM | POA: Diagnosis not present

## 2022-12-15 LAB — CUP PACEART REMOTE DEVICE CHECK
Battery Remaining Longevity: 30 mo
Battery Voltage: 2.94 V
Brady Statistic AP VP Percent: 10.19 %
Brady Statistic AP VS Percent: 69.16 %
Brady Statistic AS VP Percent: 0.03 %
Brady Statistic AS VS Percent: 20.63 %
Brady Statistic RA Percent Paced: 78.88 %
Brady Statistic RV Percent Paced: 10.05 %
Date Time Interrogation Session: 20240117042405
HighPow Impedance: 41 Ohm
HighPow Impedance: 49 Ohm
Implantable Lead Connection Status: 753985
Implantable Lead Connection Status: 753985
Implantable Lead Implant Date: 20120531
Implantable Lead Implant Date: 20120531
Implantable Lead Location: 753859
Implantable Lead Location: 753860
Implantable Lead Model: 4076
Implantable Lead Model: 6947
Implantable Pulse Generator Implant Date: 20180530
Lead Channel Impedance Value: 342 Ohm
Lead Channel Impedance Value: 418 Ohm
Lead Channel Impedance Value: 456 Ohm
Lead Channel Pacing Threshold Amplitude: 0.5 V
Lead Channel Pacing Threshold Amplitude: 0.625 V
Lead Channel Pacing Threshold Pulse Width: 0.4 ms
Lead Channel Pacing Threshold Pulse Width: 0.4 ms
Lead Channel Sensing Intrinsic Amplitude: 1.375 mV
Lead Channel Sensing Intrinsic Amplitude: 1.375 mV
Lead Channel Sensing Intrinsic Amplitude: 13.25 mV
Lead Channel Sensing Intrinsic Amplitude: 13.25 mV
Lead Channel Setting Pacing Amplitude: 1.5 V
Lead Channel Setting Pacing Amplitude: 2 V
Lead Channel Setting Pacing Pulse Width: 0.4 ms
Lead Channel Setting Sensing Sensitivity: 0.3 mV
Zone Setting Status: 755011
Zone Setting Status: 755011

## 2022-12-29 NOTE — Progress Notes (Signed)
This encounter was created in error - please disregard.

## 2023-01-05 NOTE — Progress Notes (Signed)
Remote ICD transmission.   

## 2023-01-27 DIAGNOSIS — I495 Sick sinus syndrome: Secondary | ICD-10-CM | POA: Insufficient documentation

## 2023-01-30 ENCOUNTER — Encounter: Payer: Self-pay | Admitting: Internal Medicine

## 2023-01-30 ENCOUNTER — Ambulatory Visit: Payer: Medicare PPO | Attending: Internal Medicine | Admitting: Internal Medicine

## 2023-01-30 VITALS — BP 118/66 | HR 77 | Ht 65.0 in | Wt 186.4 lb

## 2023-01-30 DIAGNOSIS — Z95810 Presence of automatic (implantable) cardiac defibrillator: Secondary | ICD-10-CM

## 2023-01-30 DIAGNOSIS — I495 Sick sinus syndrome: Secondary | ICD-10-CM | POA: Diagnosis not present

## 2023-01-30 DIAGNOSIS — I472 Ventricular tachycardia, unspecified: Secondary | ICD-10-CM

## 2023-01-30 MED ORDER — AMIODARONE HCL 200 MG PO TABS: ORAL_TABLET | ORAL | 1 refills | Status: DC

## 2023-01-30 NOTE — Patient Instructions (Addendum)
Medication Instructions:  Your physician has recommended you make the following change in your medication: Medication Dose Change:  Amiodarone  Amiodarone 200 mg,  You will-  Take 1 tablet ( 200 mg ) by mouth twice a day, for 4 weeks; Then, Take 1 tablet ( 200 mg ) by mouth ONCE daily.     Lab Work: None ordered.  If you have labs (blood work) drawn today and your tests are completely normal, you will receive your results only by: [place] (if you have MyChart) OR A paper copy in the mail If you have any lab test that is abnormal or we need to change your treatment, we will call you to review the results.  Testing/Procedures: None ordered.  Follow-Up: At [facility], you and your health needs are our priority.  As part of our continuing mission to provide you with exceptional heart care, we have created designated Provider Care Teams.  These Care Teams include your primary Cardiologist (physician) and Advanced Practice Providers (APPs -  Physician Assistants and Nurse Practitioners) who all work together to provide you with the care you need, when you need it.  We recommend signing up for the patient portal called "MyChart".  Sign up information is provided on this After Visit Summary.  MyChart is used to connect with patients for Virtual Visits (Telemedicine).  Patients are able to view lab/test results, encounter notes, upcoming appointments, etc.  Non-urgent messages can be sent to your provider as well.   To learn more about what you can do with MyChart, go to [email or web address].    Your next appointment:   Please schedule a 3 month follow up appointment with EP APP. Per Dr. Virl Axe  The format for your next appointment:   In Person  Provider:   Dr. Virl Axe  Tommye Standard, PA-C Beryle Beams" [place], [place]  Cardiac CT Angiogram A cardiac CT angiogram is a procedure to look at the heart and the area around the heart. It may be done to help find the cause  of chest pains or other symptoms of heart disease. During this procedure, a substance called contrast dye is injected into a vein in the arm. The contrast highlights the blood vessels in the area to be checked. A large X-ray machine (CT scanner), then takes detailed pictures of the heart and the surrounding area. The procedure is also sometimes called a coronary CT angiogram, coronary artery scanning, or CTA. A cardiac CT angiogram allows the health care provider to see how well blood is flowing to and from the heart. The provider will be able to see if there are any problems, such as: Blockage or narrowing of the arteries in the heart. Fluid around the heart. Signs of weakness or disease in the muscles, valves, and tissues of the heart. Tell a health care provider about: Any allergies you have. This is especially important if you have had a previous allergic reaction to medicines, contrast dye, or iodine. All medicines you are taking, including vitamins, herbs, eye drops, creams, and over-the-counter medicines. Any bleeding problems you have. Any surgeries you have had. Any medical conditions you have, including kidney problems or kidney failure. Whether you are pregnant or may be pregnant. Any anxiety disorders, chronic pain, or other conditions you have. These may increase your stress or prevent you from lying still. Any history of abnormal heart rhythms or heart procedures. What are the risks? Your provider will talk with you about risks. These may include: Bleeding. Infection.  Allergic reactions to medicines or dyes. Damage to other structures or organs. Kidney damage from the contrast dye. Increased risk of cancer from radiation exposure. This risk is low. Talk with your provider about: The risks and benefits of testing. How you can receive the lowest dose of radiation. What happens before the procedure? Wear comfortable clothing and remove any jewelry, glasses, dentures, and hearing  aids. Follow instructions from your provider about eating and drinking. These may include: 12 hours before the procedure Avoid caffeine. This includes tea, coffee, soda, energy drinks, and diet pills. Drink plenty of water or other fluids that do not have caffeine in them. Being well hydrated can prevent complications. 4-6 hours before the procedure Stop eating and drinking. This will reduce the risk of nausea from the contrast dye. Ask your provider about changing or stopping your regular medicines. These include: Diabetes medicines. Medicines to treat problems with erections (erectile dysfunction). If you have kidney problems, you may need to receive IV hydration before and after the test. What happens during the procedure?  Hair on your chest may need to be removed so that small sticky patches called electrodes can be placed on your chest. These will transmit information that helps to monitor your heart during the procedure. An IV will be inserted into one of your veins. You might be given a medicine to control your heart rate during the procedure. This will help to ensure that good images are obtained. You will be asked to lie on an exam table. This table will slide in and out of the CT machine during the procedure. Contrast dye will be injected into the IV. You might feel warm, or you may get a metallic taste in your mouth. You may be given medicines to relax or dilate the arteries in your heart. If you are allergic to contrast dyes or iodine you may be given medicine before the test to reduce the risk of an allergic reaction. The table that you are lying on will move into the CT machine tunnel for the scan. The person running the machine will give you instructions while the scans are being done. You may be asked to: Keep your arms above your head. Hold your breath for short periods. Stay very still, even if the table is moving. The procedure may vary among providers and  hospitals. What can I expect after the procedure? After your procedure, it is common to have: A metallic taste in your mouth from the contrast dye. A feeling of warmth. A headache from the heart medicine. Follow these instructions at home: Take over-the-counter and prescription medicines only as told by your provider. If you are told, drink enough fluid to keep your pee pale yellow. This will help to flush the contrast dye out of your body. Most people can return to their normal activities right after the procedure. Ask your provider what activities are safe for you. It is up to you to get the results of your procedure. Ask your provider, or the department that is doing the procedure, when your results will be ready. Contact a health care provider if: You have any symptoms of allergy to the contrast dye. These include: Shortness of breath. Rash or hives. A racing heartbeat. You notice a change in your peeing (urination). This information is not intended to replace advice given to you by your health care provider. Make sure you discuss any questions you have with your health care provider. Document Revised: 06/17/2022 Document Reviewed: 06/17/2022 Elsevier Patient  Education  [place].

## 2023-01-30 NOTE — Progress Notes (Signed)
Thank you very much yet      Patient Care Team: Wendie Agreste, MD as PCP - General (Family Medicine) [name], MD (Cardiology) [name], MD as Attending Physician (Neurology)   HPI  Dakota Gutierrez is a 72 y.o. 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 Unfortunately, in the days that followed he developed VT storm with multiple shocks and was treated with amiodarone which has been gradually down titrated to 100 mg a day.  Amiodarone surveillance had demonstrated elevated liver function tests spring 2013 . We elected to discontinue the amiodarone albeit with some reluctance on the part of the family. When seen in June 2022 of recurrent episodes of ventricular tachycardia 2 of which required therapy. He was then resumed on amiodarone and referred for consultation with GI  most recently 4/17 transaminases were normal.     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  There have been no lightheadedness or syncope.  Complains of dyspnea on exertion worsening over the last year or 2 not associated with chest discomfort.  Also intermittent senses of disease like last night (see below).   Patient denies symptoms of respiratory, GI intolerance, sun sensitivity, neurological symptoms attributable to amiodarone.          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    Past Surgical History:  Procedure Laterality Date   CARDIAC DEFIBRILLATOR PLACEMENT     Medtronic [id number] Protecta   CATARACT EXTRACTION, BILATERAL  2019   CHOLECYSTECTOMY     COLONOSCOPY WITH PROPOFOL N/A 06/30/2017   Procedure: COLONOSCOPY WITH PROPOFOL;  Surgeon: Carol Ada, MD;  Location: [facility];  Service: Endoscopy;  Laterality: N/A;   GALLBLADDER SURGERY     HERNIA REPAIR     ICD GENERATOR CHANGEOUT N/A 04/26/2017   Procedure: ICD Generator Changeout;  Surgeon: [name], MD;  Location: [facility];  Service: Cardiovascular;  Laterality: N/A;   TONSILLECTOMY AND ADENOIDECTOMY      Current Outpatient Medications  Medication Sig Dispense Refill   acetaminophen (TYLENOL) 500  MG tablet Take 1,000 mg by mouth daily as needed for moderate pain or headache.     amiodarone (PACERONE) 200 MG tablet Take 1/2 (one-half) tablet by mouth once daily 45 tablet 1   aspirin EC 81 MG tablet Take 1 tablet (81 mg total) by mouth daily.     atorvastatin (LIPITOR) 10 MG tablet TAKE 1 TABLET BY MOUTH EVERY DAY 90 tablet 2   Cholecalciferol (VITAMIN D) 2000 UNITS tablet Take 2,000 Units by mouth daily.     empagliflozin (JARDIANCE) 25 MG TABS tablet Take 1 tablet (25 mg total) by mouth daily before breakfast. 90 tablet 1   famotidine (PEPCID) 20 MG tablet Take 1 tablet (20 mg total) by mouth 2 (two) times daily as needed. 60 tablet  4   gabapentin (NEURONTIN) 100 MG capsule TAKE 1 CAPSULE BY MOUTH EVERYDAY AT BEDTIME 90 capsule 0   metFORMIN (GLUCOPHAGE) 500 MG tablet Take 1 tablet (500 mg total) by mouth daily with breakfast. 90 tablet 3   metoprolol succinate (TOPROL XL) 50 MG 24 hr tablet Take 1 tablet (50 mg total) by mouth daily. Take in addition to Toprol XL 100 mg.  100 mg in AM and 50 mg in PM PO. 90 tablet 3   metoprolol succinate (TOPROL-XL) 100 MG 24 hr tablet TAKE 1 TABLET BY MOUTH ONCE DAILY IMMEDIATELY  FOLLOWING  A  MEAL 90 tablet 3   olmesartan-hydrochlorothiazide (BENICAR HCT) 20-12.5 MG tablet TAKE 1 TABLET BY MOUTH EVERY DAY 90 tablet 2   Omega-3 Fatty Acids (FISH OIL) 1000 MG CAPS Take 1,000 mg by mouth 2 (two) times daily.     tadalafil (CIALIS) 20 MG tablet Take 0.5-1 tablets (10-20 mg total) by mouth daily as needed for erectile dysfunction. 10 tablet 11   No current facility-administered medications for this visit.    Allergies  Allergen Reactions   Adhesive [Tape]     Swelling and welts formed    Review of Systems negative except from HPI and PMH except for complaints of back pain and muscle pain  Physical Exam BP 118/66   Pulse 77   Ht '5\' 5"'$  (1.651 m)   Wt 186 lb 6.4 oz (84.6 kg)   SpO2 99%   BMI 31.02 kg/m  Well developed and well nourished in no acute distress HENT normal Neck supple with JVP-flat Clear Device pocket well healed; without hematoma or erythema.  There is no tethering  Regular rate and rhythm, no  gallop No  murmur Abd-soft with active BS No Clubbing cyanosis  edema Skin-warm and dry A & Oriented  Grossly normal sensory and motor function  ECG atrial pacing at 77 Intervals 26/11/38  Device function is normal. Programming changes creating a second VT zone from 177-222 and antitachycardia pacing See Paceart for details    Assessment and  Plan  VT recurrent  ICD Medtronic    Sinus noded dysfunction   Amiodarone rx   Recurrent double ventricular  tachycardia occurring in clusters nonsustained VT  The coupling intervals of the ventricular tachycardia are relatively short, the electrograms are not monotonously monomorphic and there is an acceleration at the onset of the tachycardia suggesting automaticity.  These raise the possibility of ischemia, it has been more than a decade since his arteries were evaluated, we will undertake cardiac CTA.  Given this is a second episode of ventricular tachycardia-treated in the last few months, we will increase his amiodarone from 100--400 mg a day x 1 month and then back down  to 200 mg a day.  We will plan to see him again in 3 months with PA for amiodarone surveillance laboratories  Reminded of 6 months driving restrictions

## 2023-02-01 ENCOUNTER — Telehealth: Payer: Self-pay | Admitting: Internal Medicine

## 2023-02-01 DIAGNOSIS — I472 Ventricular tachycardia, unspecified: Secondary | ICD-10-CM

## 2023-02-01 MED ORDER — AMIODARONE HCL 100 MG PO TABS: 100.0000 mg | ORAL_TABLET | Freq: Two times a day (BID) | ORAL | 3 refills | Status: DC

## 2023-02-01 NOTE — Telephone Encounter (Signed)
Pt c/o medication issue:  1. Name of Medication: amiodarone (PACERONE) 200 MG tablet   2. How are you currently taking this medication (dosage and times per day)? Yes  3. Are you having a reaction (difficulty breathing--STAT)? Yes  4. What is your medication issue? Patients states he is feeling a little warm and feels pulse in hands with this medication. He is requesting call back to discuss this.

## 2023-02-01 NOTE — Telephone Encounter (Signed)
Spoke to patient regarding his concerns about increased dose of amiodarone. At office visit 01/30/23 Dr. Caryl Comes ordered amiodarone loading. Patient was previously on 100 mg daily, Dr. Caryl Comes ordered patient to increase to 200 mgBID for 4 weeks, then decrease to 200 mg daily. Patient states he took 200 mg in the morning yesterday but states since then  he feels warm. He also notes he feels his pulse in his fingertips. He states his HR is currently 83. He denies any chest pain, SOB or fatigue. Spoke with Dr. Caryl Comes who advises patient to take 100 mg BID for 2 weeks and let us know in one week if this dose is better tolerated. Explained this to patient who verbalizes understanding. Med list updated.   I attempted to have patient send a transmission but patient was unable to do this and kept getting error messages on his handheld monitor. I talked with device department and his handheld monitor needs to be replaced. Gave patient [employer] tech support number [phone] so he could get it replaced. His device is still able to transmit, device department states no alerts received.

## 2023-02-04 ENCOUNTER — Telehealth: Payer: Self-pay | Admitting: Cardiology

## 2023-02-04 ENCOUNTER — Inpatient Hospital Stay (HOSPITAL_COMMUNITY)
Admission: EM | Admit: 2023-02-04 | Discharge: 2023-02-09 | DRG: 322 | Disposition: A | Discharge status: Home / Self Care | Payer: Medicare PPO | Attending: Internal Medicine | Admitting: Internal Medicine

## 2023-02-04 DIAGNOSIS — G4733 Obstructive sleep apnea (adult) (pediatric): Secondary | ICD-10-CM | POA: Diagnosis present

## 2023-02-04 DIAGNOSIS — E1165 Type 2 diabetes mellitus with hyperglycemia: Secondary | ICD-10-CM

## 2023-02-04 DIAGNOSIS — I10 Essential (primary) hypertension: Secondary | ICD-10-CM | POA: Diagnosis present

## 2023-02-04 DIAGNOSIS — I428 Other cardiomyopathies: Secondary | ICD-10-CM | POA: Diagnosis present

## 2023-02-04 DIAGNOSIS — I255 Ischemic cardiomyopathy: Secondary | ICD-10-CM | POA: Diagnosis present

## 2023-02-04 DIAGNOSIS — Z7982 Long term (current) use of aspirin: Secondary | ICD-10-CM

## 2023-02-04 DIAGNOSIS — I472 Ventricular tachycardia, unspecified: Principal | ICD-10-CM | POA: Diagnosis present

## 2023-02-04 DIAGNOSIS — F4310 Post-traumatic stress disorder, unspecified: Secondary | ICD-10-CM | POA: Diagnosis present

## 2023-02-04 DIAGNOSIS — R112 Nausea with vomiting, unspecified: Secondary | ICD-10-CM | POA: Diagnosis not present

## 2023-02-04 DIAGNOSIS — Z91048 Other nonmedicinal substance allergy status: Secondary | ICD-10-CM

## 2023-02-04 DIAGNOSIS — I4729 Other ventricular tachycardia: Secondary | ICD-10-CM | POA: Diagnosis present

## 2023-02-04 DIAGNOSIS — E785 Hyperlipidemia, unspecified: Secondary | ICD-10-CM | POA: Diagnosis present

## 2023-02-04 DIAGNOSIS — J9811 Atelectasis: Secondary | ICD-10-CM | POA: Diagnosis not present

## 2023-02-04 DIAGNOSIS — K219 Gastro-esophageal reflux disease without esophagitis: Secondary | ICD-10-CM | POA: Diagnosis present

## 2023-02-04 DIAGNOSIS — I499 Cardiac arrhythmia, unspecified: Secondary | ICD-10-CM | POA: Diagnosis not present

## 2023-02-04 DIAGNOSIS — Z7984 Long term (current) use of oral hypoglycemic drugs: Secondary | ICD-10-CM

## 2023-02-04 DIAGNOSIS — Z95810 Presence of automatic (implantable) cardiac defibrillator: Secondary | ICD-10-CM

## 2023-02-04 DIAGNOSIS — Z8249 Family history of ischemic heart disease and other diseases of the circulatory system: Secondary | ICD-10-CM

## 2023-02-04 DIAGNOSIS — Z79899 Other long term (current) drug therapy: Secondary | ICD-10-CM

## 2023-02-04 DIAGNOSIS — Z86010 Personal history of colonic polyps: Secondary | ICD-10-CM

## 2023-02-04 DIAGNOSIS — I2510 Atherosclerotic heart disease of native coronary artery without angina pectoris: Secondary | ICD-10-CM | POA: Diagnosis present

## 2023-02-04 DIAGNOSIS — Z955 Presence of coronary angioplasty implant and graft: Secondary | ICD-10-CM

## 2023-02-04 DIAGNOSIS — I491 Atrial premature depolarization: Secondary | ICD-10-CM | POA: Diagnosis not present

## 2023-02-04 DIAGNOSIS — R000 Tachycardia, unspecified: Secondary | ICD-10-CM | POA: Diagnosis not present

## 2023-02-04 DIAGNOSIS — E119 Type 2 diabetes mellitus without complications: Secondary | ICD-10-CM | POA: Diagnosis present

## 2023-02-04 MED ORDER — MEXILETINE HCL 200 MG PO CAPS: 200.0000 mg | ORAL_CAPSULE | Freq: Two times a day (BID) | ORAL | 0 refills | Status: DC

## 2023-02-04 NOTE — ED Triage Notes (Signed)
Per EMS, pt from home, has been shocked twice by his defibrillator tonight.  He is going in and out of wide tachycardia.  He "sometimes feels it and some times does not."  No Chest pain other than shocks by defibrillator.    '150mg'$  Amiodarone - stabilized for a short while  On 2nd dose of Amiodarone '150mg'$  dose now.  144/92 70 - atrial paced 18 RR 166 CBG  18G R forearm 18 G L AC

## 2023-02-04 NOTE — Telephone Encounter (Signed)
Patient called the answering service this AM reporting that his defibrillator shocked him yesterday before he went to bed. Prior to getting shocked, patient felt flushed and warm. After getting shocked, he felt "sick". Today, he feels much better. Denies recurrent shocks.   Patient was seen by Dr. Caryl Comes on 3/4. Dr. Caryl Comes recommended amiodarone loading with 200 mg BID for 4 weeks to be followed by 200 mg daily. Patient called the office on 3/6 stating that he took 200 mg of amiodarone that AM and felt warm after taking the medication. Dr. Caryl Comes told him it was OK to reduce amiodarone to 100 mg BID.   Today, patient reports that he has been taking amiodarone 100 mg twice daily. I called Dr. Caryl Comes to discuss. Dr. Caryl Comes recommended starting mexiletine 200 mg BID and continuing amiodarone. I encouraged patient to increase his amiodarone back to 200 mg BID if he can tolerate it. If he develops side effects, he can continue amiodarone 100 mg BID. Discussed that mexiletine can cause nausea, so patient should take the medication with food. All questions were answered. Instructed patient to come to the ED if he has a second shock this weekend.   I will message the [facility] to arrange an appointment in approx 10 days (per Dr. Caryl Comes).   Margie Billet, PA-C 02/04/2023 12:03 PM

## 2023-02-05 ENCOUNTER — Other Ambulatory Visit: Payer: Self-pay

## 2023-02-05 ENCOUNTER — Encounter (HOSPITAL_COMMUNITY): Payer: Self-pay | Admitting: Emergency Medicine

## 2023-02-05 ENCOUNTER — Emergency Department (HOSPITAL_COMMUNITY): Payer: Medicare PPO

## 2023-02-05 DIAGNOSIS — I255 Ischemic cardiomyopathy: Secondary | ICD-10-CM | POA: Diagnosis present

## 2023-02-05 DIAGNOSIS — Z79899 Other long term (current) drug therapy: Secondary | ICD-10-CM | POA: Diagnosis not present

## 2023-02-05 DIAGNOSIS — I4729 Other ventricular tachycardia: Secondary | ICD-10-CM | POA: Diagnosis present

## 2023-02-05 DIAGNOSIS — K219 Gastro-esophageal reflux disease without esophagitis: Secondary | ICD-10-CM | POA: Diagnosis present

## 2023-02-05 DIAGNOSIS — E785 Hyperlipidemia, unspecified: Secondary | ICD-10-CM | POA: Diagnosis present

## 2023-02-05 DIAGNOSIS — Z7982 Long term (current) use of aspirin: Secondary | ICD-10-CM | POA: Diagnosis not present

## 2023-02-05 DIAGNOSIS — G4733 Obstructive sleep apnea (adult) (pediatric): Secondary | ICD-10-CM | POA: Diagnosis present

## 2023-02-05 DIAGNOSIS — I472 Ventricular tachycardia, unspecified: Secondary | ICD-10-CM | POA: Diagnosis present

## 2023-02-05 DIAGNOSIS — I428 Other cardiomyopathies: Secondary | ICD-10-CM | POA: Diagnosis present

## 2023-02-05 DIAGNOSIS — I10 Essential (primary) hypertension: Secondary | ICD-10-CM | POA: Diagnosis present

## 2023-02-05 DIAGNOSIS — Z95810 Presence of automatic (implantable) cardiac defibrillator: Secondary | ICD-10-CM | POA: Diagnosis not present

## 2023-02-05 DIAGNOSIS — I2510 Atherosclerotic heart disease of native coronary artery without angina pectoris: Secondary | ICD-10-CM | POA: Diagnosis present

## 2023-02-05 DIAGNOSIS — Z8249 Family history of ischemic heart disease and other diseases of the circulatory system: Secondary | ICD-10-CM | POA: Diagnosis not present

## 2023-02-05 DIAGNOSIS — Z7984 Long term (current) use of oral hypoglycemic drugs: Secondary | ICD-10-CM | POA: Diagnosis not present

## 2023-02-05 DIAGNOSIS — E119 Type 2 diabetes mellitus without complications: Secondary | ICD-10-CM | POA: Diagnosis present

## 2023-02-05 DIAGNOSIS — Z86010 Personal history of colonic polyps: Secondary | ICD-10-CM | POA: Diagnosis not present

## 2023-02-05 DIAGNOSIS — F4310 Post-traumatic stress disorder, unspecified: Secondary | ICD-10-CM | POA: Diagnosis present

## 2023-02-05 DIAGNOSIS — Z91048 Other nonmedicinal substance allergy status: Secondary | ICD-10-CM | POA: Diagnosis not present

## 2023-02-05 LAB — CBC WITH DIFFERENTIAL/PLATELET
Abs Immature Granulocytes: 0.12 10*3/uL — ABNORMAL HIGH (ref 0.00–0.07)
Basophils Absolute: 0 10*3/uL (ref 0.0–0.1)
Basophils Relative: 1 %
Eosinophils Absolute: 0.2 10*3/uL (ref 0.0–0.5)
Eosinophils Relative: 3 %
HCT: 43.3 % (ref 39.0–52.0)
Hemoglobin: 14.9 g/dL (ref 13.0–17.0)
Immature Granulocytes: 2 %
Lymphocytes Relative: 18 %
Lymphs Abs: 1.1 10*3/uL (ref 0.7–4.0)
MCH: 30 pg (ref 26.0–34.0)
MCHC: 34.4 g/dL (ref 30.0–36.0)
MCV: 87.1 fL (ref 80.0–100.0)
Monocytes Absolute: 0.7 10*3/uL (ref 0.1–1.0)
Monocytes Relative: 10 %
Neutro Abs: 4.4 10*3/uL (ref 1.7–7.7)
Neutrophils Relative %: 66 %
Platelets: 160 10*3/uL (ref 150–400)
RBC: 4.97 MIL/uL (ref 4.22–5.81)
RDW: 13.2 % (ref 11.5–15.5)
WBC: 6.5 10*3/uL (ref 4.0–10.5)
nRBC: 0 % (ref 0.0–0.2)

## 2023-02-05 LAB — COMPREHENSIVE METABOLIC PANEL
ALT: 24 U/L (ref 0–44)
AST: 32 U/L (ref 15–41)
Albumin: 3.9 g/dL (ref 3.5–5.0)
Alkaline Phosphatase: 42 U/L (ref 38–126)
Anion gap: 15 (ref 5–15)
BUN: 19 mg/dL (ref 8–23)
CO2: 18 mmol/L — ABNORMAL LOW (ref 22–32)
Calcium: 9.1 mg/dL (ref 8.9–10.3)
Chloride: 103 mmol/L (ref 98–111)
Creatinine, Ser: 1.29 mg/dL — ABNORMAL HIGH (ref 0.61–1.24)
GFR, Estimated: 59 mL/min — ABNORMAL LOW (ref >60)
Glucose, Bld: 173 mg/dL — ABNORMAL HIGH (ref 70–99)
Potassium: 3.7 mmol/L (ref 3.5–5.1)
Sodium: 136 mmol/L (ref 135–145)
Total Bilirubin: 0.9 mg/dL (ref 0.3–1.2)
Total Protein: 6.6 g/dL (ref 6.5–8.1)

## 2023-02-05 LAB — GLUCOSE, CAPILLARY
Glucose-Capillary: 236 mg/dL — ABNORMAL HIGH (ref 70–99)
Glucose-Capillary: 236 mg/dL — ABNORMAL HIGH (ref 70–99)
Glucose-Capillary: 167 mg/dL — ABNORMAL HIGH (ref 70–99)
Glucose-Capillary: 188 mg/dL — ABNORMAL HIGH (ref 70–99)

## 2023-02-05 LAB — LIPID PANEL
Cholesterol: 115 mg/dL (ref 0–200)
HDL: 28 mg/dL — ABNORMAL LOW (ref >40)
LDL Cholesterol: 34 mg/dL (ref 0–99)
Total CHOL/HDL Ratio: 4.1 RATIO
Triglycerides: 263 mg/dL — ABNORMAL HIGH (ref <150)
VLDL: 53 mg/dL — ABNORMAL HIGH (ref 0–40)

## 2023-02-05 LAB — HEPATIC FUNCTION PANEL
ALT: 25 U/L (ref 0–44)
AST: 36 U/L (ref 15–41)
Albumin: 4 g/dL (ref 3.5–5.0)
Alkaline Phosphatase: 38 U/L (ref 38–126)
Bilirubin, Direct: 0.2 mg/dL (ref 0.0–0.2)
Indirect Bilirubin: 0.7 mg/dL (ref 0.3–0.9)
Total Bilirubin: 0.9 mg/dL (ref 0.3–1.2)
Total Protein: 6.7 g/dL (ref 6.5–8.1)

## 2023-02-05 LAB — TSH: TSH: 4.912 u[IU]/mL — ABNORMAL HIGH (ref 0.350–4.500)

## 2023-02-05 LAB — MAGNESIUM: Magnesium: 2.1 mg/dL (ref 1.7–2.4)

## 2023-02-05 LAB — BRAIN NATRIURETIC PEPTIDE: B Natriuretic Peptide: 134.3 pg/mL — ABNORMAL HIGH (ref 0.0–100.0)

## 2023-02-05 LAB — T4, FREE: Free T4: 1.01 ng/dL (ref 0.61–1.12)

## 2023-02-05 MED ORDER — OLMESARTAN MEDOXOMIL-HCTZ 20-12.5 MG PO TABS: 1.0000 | ORAL_TABLET | Freq: Every day | ORAL | Status: DC

## 2023-02-05 MED ORDER — ENOXAPARIN SODIUM 40 MG/0.4ML IJ SOSY
40.0000 mg | PREFILLED_SYRINGE | INTRAMUSCULAR | Status: DC
  Administered 2023-02-05: 40 mg via SUBCUTANEOUS
  Filled 2023-02-05: qty 0.4

## 2023-02-05 MED ORDER — SODIUM CHLORIDE 0.9% FLUSH
3.0000 mL | Freq: Two times a day (BID) | INTRAVENOUS | Status: DC
  Administered 2023-02-05 – 2023-02-07 (×3): 3 mL via INTRAVENOUS

## 2023-02-05 MED ORDER — ASPIRIN 81 MG PO TBEC: 81.0000 mg | DELAYED_RELEASE_TABLET | Freq: Every day | ORAL | Status: DC

## 2023-02-05 MED ORDER — LIDOCAINE BOLUS VIA INFUSION
100.0000 mg | Freq: Once | INTRAVENOUS | Status: AC
  Administered 2023-02-05: 100 mg via INTRAVENOUS
  Filled 2023-02-05: qty 100

## 2023-02-05 MED ORDER — LORAZEPAM 1 MG PO TABS
1.0000 mg | ORAL_TABLET | Freq: Once | ORAL | Status: AC
  Administered 2023-02-05: 1 mg via ORAL
  Filled 2023-02-05: qty 1

## 2023-02-05 MED ORDER — SODIUM CHLORIDE 0.9 % IV SOLN: 250.0000 mL | INTRAVENOUS | Status: DC | PRN

## 2023-02-05 MED ORDER — FENTANYL CITRATE PF 50 MCG/ML IJ SOSY
100.0000 ug | PREFILLED_SYRINGE | Freq: Once | INTRAMUSCULAR | Status: AC
  Administered 2023-02-05: 50 ug via INTRAVENOUS
  Filled 2023-02-05: qty 2

## 2023-02-05 MED ORDER — ATORVASTATIN CALCIUM 10 MG PO TABS
10.0000 mg | ORAL_TABLET | Freq: Every day | ORAL | Status: DC
  Administered 2023-02-05: 10 mg via ORAL
  Filled 2023-02-05: qty 1

## 2023-02-05 MED ORDER — AMIODARONE HCL IN DEXTROSE 360-4.14 MG/200ML-% IV SOLN
60.0000 mg/h | INTRAVENOUS | Status: AC
  Administered 2023-02-05: 60 mg/h via INTRAVENOUS
  Filled 2023-02-05: qty 200

## 2023-02-05 MED ORDER — ONDANSETRON HCL 4 MG/2ML IJ SOLN: 4.0000 mg | Freq: Four times a day (QID) | INTRAMUSCULAR | Status: DC | PRN

## 2023-02-05 MED ORDER — POTASSIUM CHLORIDE 10 MEQ/100ML IV SOLN
10.0000 meq | Freq: Once | INTRAVENOUS | Status: AC
  Administered 2023-02-05: 10 meq via INTRAVENOUS
  Filled 2023-02-05: qty 100

## 2023-02-05 MED ORDER — SODIUM CHLORIDE 0.9% FLUSH: 3.0000 mL | INTRAVENOUS | Status: DC | PRN

## 2023-02-05 MED ORDER — MAGNESIUM SULFATE 2 GM/50ML IV SOLN
2.0000 g | Freq: Once | INTRAVENOUS | Status: AC
  Administered 2023-02-05: 2 g via INTRAVENOUS
  Filled 2023-02-05: qty 50

## 2023-02-05 MED ORDER — LIDOCAINE IN D5W 4-5 MG/ML-% IV SOLN
1.0000 mg/min | INTRAVENOUS | Status: DC
  Administered 2023-02-05 – 2023-02-06 (×2): 1 mg/min via INTRAVENOUS
  Filled 2023-02-05 (×3): qty 500

## 2023-02-05 MED ORDER — ALBUMIN HUMAN 25 % IV SOLN: 12.5000 g | Freq: Once | INTRAVENOUS | Status: DC

## 2023-02-05 MED ORDER — INSULIN ASPART 100 UNIT/ML IJ SOLN
0.0000 [IU] | Freq: Three times a day (TID) | INTRAMUSCULAR | Status: DC
  Administered 2023-02-05 – 2023-02-06 (×3): 3 [IU] via SUBCUTANEOUS
  Administered 2023-02-06: 2 [IU] via SUBCUTANEOUS
  Administered 2023-02-07: 5 [IU] via SUBCUTANEOUS
  Administered 2023-02-07 – 2023-02-09 (×6): 3 [IU] via SUBCUTANEOUS

## 2023-02-05 MED ORDER — ASPIRIN 81 MG PO CHEW
81.0000 mg | CHEWABLE_TABLET | ORAL | Status: AC
  Administered 2023-02-06: 81 mg via ORAL
  Filled 2023-02-05: qty 1

## 2023-02-05 MED ORDER — METOPROLOL SUCCINATE ER 100 MG PO TB24
100.0000 mg | ORAL_TABLET | Freq: Two times a day (BID) | ORAL | Status: DC
  Administered 2023-02-05 – 2023-02-09 (×7): 100 mg via ORAL
  Filled 2023-02-05 (×7): qty 1

## 2023-02-05 MED ORDER — ACETAMINOPHEN 500 MG PO TABS: 1000.0000 mg | ORAL_TABLET | Freq: Every day | ORAL | Status: DC | PRN

## 2023-02-05 MED ORDER — POTASSIUM CHLORIDE CRYS ER 20 MEQ PO TBCR
40.0000 meq | EXTENDED_RELEASE_TABLET | Freq: Once | ORAL | Status: AC
  Administered 2023-02-05: 40 meq via ORAL
  Filled 2023-02-05: qty 2

## 2023-02-05 MED ORDER — AMIODARONE HCL IN DEXTROSE 360-4.14 MG/200ML-% IV SOLN
30.0000 mg/h | INTRAVENOUS | Status: DC
  Administered 2023-02-05 – 2023-02-08 (×7): 30 mg/h via INTRAVENOUS
  Filled 2023-02-05 (×7): qty 200

## 2023-02-05 MED ORDER — SODIUM CHLORIDE 0.9 % WEIGHT BASED INFUSION
1.0000 mL/kg/h | INTRAVENOUS | Status: DC
  Administered 2023-02-06: 1 mL/kg/h via INTRAVENOUS

## 2023-02-05 MED ORDER — SODIUM CHLORIDE 0.9 % WEIGHT BASED INFUSION
3.0000 mL/kg/h | INTRAVENOUS | Status: DC
  Administered 2023-02-06: 3 mL/kg/h via INTRAVENOUS

## 2023-02-05 MED ORDER — ASPIRIN 81 MG PO TBEC
81.0000 mg | DELAYED_RELEASE_TABLET | Freq: Every day | ORAL | Status: DC
  Administered 2023-02-07 – 2023-02-09 (×2): 81 mg via ORAL
  Filled 2023-02-05 (×3): qty 1

## 2023-02-05 MED ORDER — GABAPENTIN 100 MG PO CAPS
100.0000 mg | ORAL_CAPSULE | Freq: Every day | ORAL | Status: DC
  Administered 2023-02-05 – 2023-02-08 (×4): 100 mg via ORAL
  Filled 2023-02-05 (×4): qty 1

## 2023-02-05 NOTE — ED Notes (Signed)
Pt provided water and urinal

## 2023-02-05 NOTE — ED Notes (Signed)
Pt stated he doesn't eat breakfast. RN provided pt bag lunch.

## 2023-02-05 NOTE — Consult Note (Addendum)
Assessment and plan full note to follow  Ventricular tachycardia-incessant-enhanced automaticity>> left bundle inferior axis with an r in lead aVL S  Cardiomyopathy previously nonischemic  VT storm with multiple therapies including multiple shocks  ICD-Medtronic  Amiodarone therapy  Facial flushing  VT storm with a nearly incessant VT hands automaticity and morphology that appears to be emanating near the outflow tract, (a rS is not expected from a somewhat ventricular tachycardia) but it may well be, if catheterization is negative, see below, that this tachycardia amenable to ablation.  The incessant nature suggest automaticity, interestingly, it has been paced terminated on multiple occasions suggesting reentry also.  Ischemia to do this and the response to lidocaine is suggested that this would be the mechanism of the automaticity>> we have discussed catheterization and we will anticipate that tomorrow.  Facial flushing is infrequently reported with amiodarone more commonly reported with lidocaine.  It is probably medial given the fact that it is temporally associated with elevations in blood pressure; as I reviewed with him and his wife however, right now the ventricular tachycardia takes precedence over the flushing we will continue his medications.  In the event that he has more ventricular tachycardia I will rebolus him with both amiodarone and lidocaine and increase the drip of the lidocaine to 2 mg/min.  He will need lidocaine level in the morning.

## 2023-02-05 NOTE — ED Notes (Addendum)
ED TO INPATIENT HANDOFF REPORT  ED Nurse Name and Phone #:  Rose Fillers RN [phone]  S Name/Age/Gender Dakota Gutierrez Free 72 y.o. male Room/Bed: TRAAC/TRAAC  Code Status   Code Status: Prior  Home/SNF/Other Home Patient oriented to: self, place, time, and situation Is this baseline? Yes   Triage Complete: Triage complete  Chief Complaint Non-sustained ventricular tachycardia ([place]) [I47.29]  Triage Note Per EMS, pt from home, has been shocked twice by his defibrillator tonight.  He is going in and out of wide tachycardia.  He "sometimes feels it and some times does not."  No Chest pain other than shocks by defibrillator.    '150mg'$  Amiodarone - stabilized for a short while  On 2nd dose of Amiodarone '150mg'$  dose now.  144/92 70 - atrial paced 18 RR 166 CBG  18G R forearm 18 G L AC   Allergies Allergies  Allergen Reactions   Adhesive [Tape]     Swelling and welts formed    Level of Care/Admitting Diagnosis ED Disposition     ED Disposition  Admit   Condition  --   [place]: [place] [[id number]]  Level of Care: Telemetry Cardiac [[id number]]  May admit patient to [facility] or [facility] if equivalent level of care is available:: No  Covid Evaluation: Asymptomatic - no recent exposure (last 10 days) testing not required  Diagnosis: Non-sustained ventricular tachycardia [id number]  Admitting Physician: Gardiner Barefoot  Attending Physician: [name] [id number]  Certification:: I certify this patient will need inpatient services for at least 2 midnights          B Medical/Surgery History Past Medical History:  Diagnosis Date   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   Vitreomacular adhesion of right eye 02/15/2021   Past Surgical History:  Procedure Laterality Date   CARDIAC DEFIBRILLATOR PLACEMENT     Medtronic [id number] Protecta   CATARACT EXTRACTION, BILATERAL  2019   CHOLECYSTECTOMY     COLONOSCOPY WITH PROPOFOL N/A 06/30/2017   Procedure: COLONOSCOPY WITH PROPOFOL;  Surgeon: Carol Ada, MD;  Location: [facility];  Service: Endoscopy;  Laterality: N/A;   GALLBLADDER SURGERY     HERNIA REPAIR     ICD GENERATOR CHANGEOUT N/A 04/26/2017   Procedure: ICD Generator Changeout;  Surgeon: [name], MD;  Location: [facility];  Service: Cardiovascular;  Laterality: N/A;   TONSILLECTOMY AND ADENOIDECTOMY       A IV Location/Drains/Wounds Patient Lines/Drains/Airways Status     Active Line/Drains/Airways     Name Placement date Placement time Site Days   Peripheral IV 02/05/23 18 G Left Antecubital 02/05/23  0015  Antecubital  less than 1   Peripheral IV 02/05/23 18 G Right Forearm 02/05/23  --  Forearm  less than 1            Intake/Output Last 24 hours No intake or output data in the 24 hours ending 02/05/23 0419  Labs/Imaging Results for orders placed or performed during the hospital encounter of 02/04/23 (from the past 48 hour(s))  CBC with Differential     Status: Abnormal   Collection Time: 02/05/23 12:05 AM  Result Value Ref Range   WBC 6.5 4.0 - 10.5 K/uL   RBC 4.97 4.22 - 5.81 MIL/uL   Hemoglobin 14.9 13.0 - 17.0 g/dL   HCT 43.3 39.0 - 52.0 %   MCV 87.1 80.0 - 100.0 fL   MCH 30.0 26.0 - 34.0 pg   MCHC 34.4 30.0 - 36.0 g/dL   RDW 13.2 11.5 - 15.5 %   Platelets 160 150 - 400 K/uL   nRBC 0.0 0.0 - 0.2 %   Neutrophils Relative % 66 %   Neutro Abs 4.4 1.7 - 7.7 K/uL   Lymphocytes Relative 18 %   Lymphs Abs 1.1 0.7 - 4.0  K/uL   Monocytes Relative 10 %   Monocytes Absolute 0.7 0.1 - 1.0 K/uL   Eosinophils Relative 3 %   Eosinophils Absolute 0.2 0.0 - 0.5 K/uL   Basophils Relative 1 %   Basophils Absolute 0.0 0.0 - 0.1 K/uL   Immature Granulocytes 2 %   Abs Immature Granulocytes 0.12 (H) 0.00 - 0.07 K/uL    Comment: Performed at [facility], [address], [place], [place] [address]  Comprehensive metabolic panel     Status: Abnormal   Collection Time: 02/05/23 12:05 AM  Result Value Ref Range   Sodium 136 135 - 145 mmol/L   Potassium 3.7 3.5 - 5.1 mmol/L   Chloride 103 98 - 111 mmol/L   CO2 18 (L) 22 - 32 mmol/L   Glucose, Bld 173 (H) 70 - 99 mg/dL    Comment: Glucose reference range applies only to samples taken after fasting for at least 8 hours.   BUN 19 8 - 23 mg/dL   Creatinine, Ser 1.29 (H) 0.61 - 1.24 mg/dL   Calcium 9.1 8.9 - 10.3 mg/dL   Total Protein 6.6 6.5 - 8.1 g/dL   Albumin 3.9 3.5 - 5.0 g/dL   AST 32 15 - 41 U/L   ALT 24 0 - 44 U/L   Alkaline Phosphatase 42 38 - 126 U/L   Total Bilirubin 0.9 0.3 - 1.2 mg/dL   GFR, Estimated 59 (L) >60 mL/min    Comment: (NOTE) Calculated using the CKD-EPI Creatinine Equation (2021)    Anion gap 15 5 - 15    Comment: Performed at [facility] [address] [place], [place] [address]  Magnesium     Status: None   Collection Time: 02/05/23 12:05 AM  Result Value Ref Range   Magnesium 2.1 1.7 - 2.4 mg/dL    Comment: Performed at [facility], [place] [address], [place], [place] [address]  Brain natriuretic peptide     Status: Abnormal   Collection Time: 02/05/23 12:05 AM  Result Value Ref Range   B Natriuretic Peptide 134.3 (H) 0.0 - 100.0 pg/mL    Comment: Performed at [facility] [address] [place], [place] [address]   DG Chest Port 1 View  Result Date: 02/05/2023 CLINICAL DATA:  vtach EXAM: PORTABLE CHEST 1 VIEW COMPARISON:  None Available. FINDINGS: Left chest wall dual lead pacemaker defibrillator. The  heart and mediastinal contours are within normal limits. Aortic calcification. Bibasilar atelectasis. Query retrocardiac airspace opacity. No pulmonary edema. No pleural effusion. No pneumothorax. No acute osseous abnormality. IMPRESSION: 1. Low lung volumes  with query retrocardiac airspace opacity. Recommend repeat x-ray PA and lateral view of the chest. 2.  Aortic Atherosclerosis (ICD10-I70.0). Electronically Signed   By: Iven Finn M.D.   On: 02/05/2023 00:27    Pending Labs Unresulted Labs (From admission, onward)    None       Vitals/Pain Today's Vitals   02/05/23 0321 02/05/23 0345 02/05/23 0400 02/05/23 0415  BP:  130/60 135/70   Pulse:  68 68   Resp:  (!) 22 19   Temp:    (!) 97.1 F (36.2 C)  TempSrc:    Temporal  SpO2:  94% 91%   Weight:      Height:      PainSc: 0-No pain       Isolation Precautions No active isolations  Medications Medications  amiodarone (NEXTERONE PREMIX) 360-4.14 MG/200ML-% (1.8 mg/mL) IV infusion (60 mg/hr Intravenous New Bag/Given 02/05/23 0017)  amiodarone (NEXTERONE PREMIX) 360-4.14 MG/200ML-% (1.8 mg/mL) IV infusion (has no administration in time range)  lidocaine (XYLOCAINE) 4 mg/mL bolus via infusion 100 mg (100 mg Intravenous Bolus from Bag 02/05/23 0417)    And  lidocaine (cardiac) 2000 mg in dextrose 5% 500 mL ('4mg'$ /mL) IV infusion (1 mg/min Intravenous New Bag/Given 02/05/23 0416)  potassium chloride 10 mEq in 100 mL IVPB (has no administration in time range)  potassium chloride SA (KLOR-CON M) CR tablet 40 mEq (has no administration in time range)  magnesium sulfate IVPB 2 g 50 mL (0 g Intravenous Stopped 02/05/23 0141)  fentaNYL (SUBLIMAZE) injection 100 mcg (50 mcg Intravenous Given 02/05/23 0108)    Mobility walks     Focused Assessments     R Recommendations: See Admitting Provider Note  Report given to:   Additional Notes:

## 2023-02-05 NOTE — ED Provider Notes (Signed)
[facility] Provider Note   CSN: [id number] Arrival date & time: 02/04/23  2354     History  Chief Complaint  Patient presents with   Defib Fired    Dakota Gutierrez is a 72 y.o. male.  72 yo M with a chief complaints of having his defibrillator firing 3 times while at home.  Once happened last night and then to tonight.  He had recently seen his EP physician and was told to increase his amiodarone.  Unfortunately he had developed some nausea with this and so had some trouble going up to the 400 mg level.  He did take 300 mg tonight but when it fired again he called EMS and came here for evaluation.  Having runs of V. tach with EMS.  No firing of the defibrillator en route.  He was given 2 boluses of 150 of amiodarone.  He denies any chest pain or shortness of breath.  Denies any cough congestion or fever denies nausea vomiting.  He has had intermittent diarrhea that he thinks is due to some medicines he takes.        Home Medications Prior to Admission medications   Medication Sig Start Date End Date Taking? Authorizing Provider  acetaminophen (TYLENOL) 500 MG tablet Take 1,000 mg by mouth daily as needed for moderate pain or headache.    [provider]  amiodarone (PACERONE) 100 MG tablet Take 1 tablet (100 mg total) by mouth 2 (two) times daily. 02/01/23   [name], MD  aspirin EC 81 MG tablet Take 1 tablet (81 mg total) by mouth daily. 11/11/11   [name], MD  atorvastatin (LIPITOR) 10 MG tablet TAKE 1 TABLET BY MOUTH EVERY DAY 10/21/22   Wendie Agreste, MD  Cholecalciferol (VITAMIN D) 2000 UNITS tablet Take 2,000 Units by mouth daily.    [provider]  empagliflozin (JARDIANCE) 25 MG TABS tablet Take 1 tablet (25 mg total) by mouth daily before breakfast. 11/17/22   Wendie Agreste, MD  famotidine (PEPCID) 20 MG tablet Take 1 tablet (20 mg total) by mouth 2 (two) times daily as needed. 04/16/12    Pyrtle, Lajuan Lines, MD  gabapentin (NEURONTIN) 100 MG capsule TAKE 1 CAPSULE BY MOUTH EVERYDAY AT BEDTIME 11/17/22   Wendie Agreste, MD  metFORMIN (GLUCOPHAGE) 500 MG tablet Take 1 tablet (500 mg total) by mouth daily with breakfast. 11/17/22   Wendie Agreste, MD  metoprolol succinate (TOPROL XL) 50 MG 24 hr tablet Take 1 tablet (50 mg total) by mouth daily. Take in addition to Toprol XL 100 mg.  100 mg in AM and 50 mg in PM PO. 09/26/22   [name], MD  metoprolol succinate (TOPROL-XL) 100 MG 24 hr tablet TAKE 1 TABLET BY MOUTH ONCE DAILY IMMEDIATELY  FOLLOWING  A  MEAL 03/25/22   [name], MD  mexiletine (MEXITIL) 200 MG capsule Take 1 capsule (200 mg total) by mouth 2 (two) times daily. 02/04/23   Margie Billet, PA-C  olmesartan-hydrochlorothiazide (BENICAR HCT) 20-12.5 MG tablet TAKE 1 TABLET BY MOUTH EVERY DAY 10/21/22   Wendie Agreste, MD  Omega-3 Fatty Acids (FISH OIL) 1000 MG CAPS Take 1,000 mg by mouth 2 (two) times daily.    [provider]  tadalafil (CIALIS) 20 MG tablet Take 0.5-1 tablets (10-20 mg total) by mouth daily as needed for erectile dysfunction. 09/15/16   Wendie Agreste, MD  Allergies    Adhesive [tape]    Review of Systems   Review of Systems  Physical Exam Updated Vital Signs BP 127/65   Pulse 69   Resp 19   Ht '5\' 5"'$  (1.651 m)   Wt 84.6 kg   SpO2 96%   BMI 31.04 kg/m  Physical Exam Vitals and nursing note reviewed.  Constitutional:      Appearance: He is well-developed.  HENT:     Head: Normocephalic and atraumatic.  Eyes:     Pupils: Pupils are equal, round, and reactive to light.  Neck:     Vascular: No JVD.  Cardiovascular:     Rate and Rhythm: Regular rhythm. Tachycardia present.     Heart sounds: No murmur heard.    No friction rub. No gallop.  Pulmonary:     Effort: No respiratory distress.     Breath sounds: No wheezing.  Abdominal:     General: There is no distension.     Tenderness: There is no  abdominal tenderness. There is no guarding or rebound.  Musculoskeletal:        General: Normal range of motion.     Cervical back: Normal range of motion and neck supple.  Skin:    Coloration: Skin is not pale.     Findings: No rash.  Neurological:     Mental Status: He is alert and oriented to person, place, and time.  Psychiatric:        Behavior: Behavior normal.     ED Results / Procedures / Treatments   Labs (all labs ordered are listed, but only abnormal results are displayed) Labs Reviewed  CBC WITH DIFFERENTIAL/PLATELET - Abnormal; Notable for the following components:      Result Value   Abs Immature Granulocytes 0.12 (*)    All other components within normal limits  COMPREHENSIVE METABOLIC PANEL - Abnormal; Notable for the following components:   CO2 18 (*)    Glucose, Bld 173 (*)    Creatinine, Ser 1.29 (*)    GFR, Estimated 59 (*)    All other components within normal limits  BRAIN NATRIURETIC PEPTIDE - Abnormal; Notable for the following components:   B Natriuretic Peptide 134.3 (*)    All other components within normal limits  MAGNESIUM    EKG EKG Interpretation  Date/Time:  Sunday February 05 2023 00:56:30 EST Ventricular Rate:  171 PR Interval:    QRS Duration: 153 QT Interval:  344 QTC Calculation: 535 R Axis:   89 Text Interpretation: Ventricular tachycardia (ventricular or supraventricular with aberration) Confirmed by Deno Etienne [phone]) on 02/05/2023 1:38:56 AM  Radiology No results found.  Procedures .Critical Care  Performed by: Deno Etienne, DO Authorized by: Deno Etienne, DO   Critical care provider statement:    Critical care time (minutes):  80   Critical care time was exclusive of:  Separately billable procedures and treating other patients   Critical care was time spent personally by me on the following activities:  Development of treatment plan with patient or surrogate, discussions with consultants, evaluation of patient's response to  treatment, examination of patient, ordering and review of laboratory studies, ordering and review of radiographic studies, ordering and performing treatments and interventions, pulse oximetry, re-evaluation of patient's condition and review of old charts   Care discussed with: admitting provider       Medications Ordered in ED Medications  amiodarone (NEXTERONE PREMIX) 360-4.14 MG/200ML-% (1.8 mg/mL) IV infusion (60 mg/hr Intravenous New Bag/Given 02/05/23 0017)  amiodarone (NEXTERONE PREMIX) 360-4.14 MG/200ML-% (1.8 mg/mL) IV infusion (has no administration in time range)  lidocaine (XYLOCAINE) 4 mg/mL bolus via infusion 100 mg (has no administration in time range)    And  lidocaine (cardiac) 2000 mg in dextrose 5% 500 mL ('4mg'$ /mL) IV infusion (has no administration in time range)  potassium chloride 10 mEq in 100 mL IVPB (has no administration in time range)  potassium chloride SA (KLOR-CON M) CR tablet 40 mEq (has no administration in time range)  magnesium sulfate IVPB 2 g 50 mL (0 g Intravenous Stopped 02/05/23 0141)  fentaNYL (SUBLIMAZE) injection 100 mcg (50 mcg Intravenous Given 02/05/23 0108)    ED Course/ Medical Decision Making/ A&P                             Medical Decision Making Amount and/or Complexity of Data Reviewed Labs: ordered. Radiology: ordered. ECG/medicine tests: ordered.  Risk Prescription drug management. Decision regarding hospitalization.   72 yo M with a chief complaints of having his defibrillator fire x 3.  Patient was found to be in V. tach with EMS continue to have runs here.  He was given 2 boluses of amnio en route.  Will start on infusion.  Will discuss with cardiology.  Patient having frequent shocks.  Given mg bolus.    Cards recommends starting lido infusion. K <4.0  Will give K.  Admit.   The patients results and plan were reviewed and discussed.   Any x-rays performed were independently reviewed by myself.   Differential diagnosis were  considered with the presenting HPI.  Medications  amiodarone (NEXTERONE PREMIX) 360-4.14 MG/200ML-% (1.8 mg/mL) IV infusion (60 mg/hr Intravenous New Bag/Given 02/05/23 0017)  amiodarone (NEXTERONE PREMIX) 360-4.14 MG/200ML-% (1.8 mg/mL) IV infusion (has no administration in time range)  lidocaine (XYLOCAINE) 4 mg/mL bolus via infusion 100 mg (has no administration in time range)    And  lidocaine (cardiac) 2000 mg in dextrose 5% 500 mL ('4mg'$ /mL) IV infusion (has no administration in time range)  potassium chloride 10 mEq in 100 mL IVPB (has no administration in time range)  potassium chloride SA (KLOR-CON M) CR tablet 40 mEq (has no administration in time range)  magnesium sulfate IVPB 2 g 50 mL (0 g Intravenous Stopped 02/05/23 0141)  fentaNYL (SUBLIMAZE) injection 100 mcg (50 mcg Intravenous Given 02/05/23 0108)    Vitals:   02/05/23 0115 02/05/23 0130 02/05/23 0145 02/05/23 0315  BP: (!) 142/70 118/65 (!) 140/73 127/65  Pulse: 77 69 71 69  Resp: 13 19 (!) 21 19  SpO2: 96% 93% 92% 96%  Weight:      Height:        Final diagnoses:  Ventricular tachycardia ([place])    Admission/ observation were discussed with the admitting physician, patient and/or family and they are comfortable with the plan.            Final Clinical Impression(s) / ED Diagnoses Final diagnoses:  Ventricular tachycardia [facility])    Rx / DC Orders ED Discharge Orders     None         Deno Etienne, DO 02/05/23 [id number]

## 2023-02-05 NOTE — H&P (Signed)
Cardiology Admission History and Physical:   Patient ID: DAKOTTA SCHOEFF MRN: [id number]; DOB: [birth year]   Admission date: 02/04/2023  Primary Care Provider: Wendie Agreste, MD [facility] Cardiologist: None  [facility] Electrophysiologist:  [name], MD  Chief Complaint: Recurrent ICD Shocks  Patient Profile:   Dakota Gutierrez is a 72 y.o. 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  History of Present Illness:   Dakota Gutierrez pertinent cardiovascular history dates back to 2012, at which time patient underwent MDT dual chamber ICD implantation. He developed VT storm in the days following placement with several ICD shocks. He was started on amiodarone in 2012, which was c/b abnormal LFTs. Amiodarone was discontinued in 2013, however later resumed in 2017 when device interrogation revealed appropriate ATP for recurrent VT. He has remained on amiodarone since 2017. As documented in recent EP notes, he has had several appropriate treatments with ATP since that time, but no recurrent ICD discharges since his device was implanted in 2012 until current presentation.  Dakota Gutierrez states that he was USOH until evening of 3/8 when around 11pm he experienced ICD shock. He then experienced two additional ICD shocks on 3/9 between 10:30 and 11pm, which prompted him to call EMS. Per report, patient had multiple runs of VT with EMS and received '150mg'$  IV amiodarone x2.  On arrival to ED, Dakota Gutierrez was afebrile and hemodynamically stable with BP 140/70, HR 60-70s. Amiodarone infusion started. Despite multiple amiodarone boluses and infusion, patient had recurrent VT in ED with 4 additional ICD discharges. Cardiology consulted for further management.   Of note, Dakota Gutierrez was recently seen by his primary electrophysiologist, Dr. Caryl Comes, in clinic on  01/30/23. At this time, his amiodarone was increased from '100mg'$  QD to '400mg'$  QD with plan to continue for 1 month prior to reducing dose to '200mg'$  QD. Dakota Gutierrez states that he was unable to tolerate '400mg'$  QD due to nausea and he has instead been taking '100mg'$  BID this week. On the day of presentation, he took '200mg'$  in AM followed by '100mg'$  in evening prior to recurrent ICD shocks. Cardiac CTA ordered to assess for underlying ischemia as etiology for recurrent episodes of VT successfully terminated with ATP. No recent ischemic evaluation.     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    Past Surgical History:  Procedure Laterality Date   CARDIAC DEFIBRILLATOR PLACEMENT     Medtronic [id number] Protecta   CATARACT EXTRACTION, BILATERAL  2019   CHOLECYSTECTOMY     COLONOSCOPY WITH PROPOFOL N/A 06/30/2017   Procedure: COLONOSCOPY WITH PROPOFOL;  Surgeon: Carol Ada, MD;  Location: [facility];  Service: Endoscopy;  Laterality: N/A;   GALLBLADDER SURGERY     HERNIA REPAIR     ICD GENERATOR CHANGEOUT N/A 04/26/2017  Procedure: ICD Generator Changeout;  Surgeon: [name], MD;  Location: [facility];  Service: Cardiovascular;  Laterality: N/A;   TONSILLECTOMY AND ADENOIDECTOMY       Medications Prior to Admission: Prior to Admission medications   Medication Sig Start Date End Date Taking? Authorizing Provider  acetaminophen (TYLENOL) 500 MG tablet Take 1,000  mg by mouth daily as needed for moderate pain or headache.    [provider]  amiodarone (PACERONE) 100 MG tablet Take 1 tablet (100 mg total) by mouth 2 (two) times daily. 02/01/23   [name], MD  aspirin EC 81 MG tablet Take 1 tablet (81 mg total) by mouth daily. 11/11/11   [name], MD  atorvastatin (LIPITOR) 10 MG tablet TAKE 1 TABLET BY MOUTH EVERY DAY 10/21/22   Wendie Agreste, MD  Cholecalciferol (VITAMIN D) 2000 UNITS tablet Take 2,000 Units by mouth daily.    [provider]  empagliflozin (JARDIANCE) 25 MG TABS tablet Take 1 tablet (25 mg total) by mouth daily before breakfast. 11/17/22   Wendie Agreste, MD  famotidine (PEPCID) 20 MG tablet Take 1 tablet (20 mg total) by mouth 2 (two) times daily as needed. 04/16/12   Pyrtle, Lajuan Lines, MD  gabapentin (NEURONTIN) 100 MG capsule TAKE 1 CAPSULE BY MOUTH EVERYDAY AT BEDTIME 11/17/22   Wendie Agreste, MD  metFORMIN (GLUCOPHAGE) 500 MG tablet Take 1 tablet (500 mg total) by mouth daily with breakfast. 11/17/22   Wendie Agreste, MD  metoprolol succinate (TOPROL XL) 50 MG 24 hr tablet Take 1 tablet (50 mg total) by mouth daily. Take in addition to Toprol XL 100 mg.  100 mg in AM and 50 mg in PM PO. 09/26/22   [name], MD  metoprolol succinate (TOPROL-XL) 100 MG 24 hr tablet TAKE 1 TABLET BY MOUTH ONCE DAILY IMMEDIATELY  FOLLOWING  A  MEAL 03/25/22   [name], MD  mexiletine (MEXITIL) 200 MG capsule Take 1 capsule (200 mg total) by mouth 2 (two) times daily. 02/04/23   Margie Billet, PA-C  olmesartan-hydrochlorothiazide (BENICAR HCT) 20-12.5 MG tablet TAKE 1 TABLET BY MOUTH EVERY DAY 10/21/22   Wendie Agreste, MD  Omega-3 Fatty Acids (FISH OIL) 1000 MG CAPS Take 1,000 mg by mouth 2 (two) times daily.    [provider]  tadalafil (CIALIS) 20 MG tablet Take 0.5-1 tablets (10-20 mg total) by mouth daily as needed for erectile dysfunction. 09/15/16   Wendie Agreste, MD      Allergies:    Allergies  Allergen Reactions   Adhesive [Tape]     Swelling and welts formed    Social History:   Social History   Socioeconomic History   Marital status: Married    Spouse name: Not on file   Number of children: 0   Years of education: Not on file   Highest education level: Bachelor's degree (e.g., BA, AB, BS)  Occupational History   Occupation: Retired Pharmacist, hospital  Tobacco Use   Smoking status: Never   Smokeless tobacco: Never  Vaping Use   Vaping Use: Never used  Substance and Sexual Activity   Alcohol use: Yes    Alcohol/week: 2.0 standard drinks of alcohol    Types: 2 Cans of beer per week    Comment: 4/week   Drug use: No   Sexual activity: Yes  Other Topics Concern   Not on file  Social History Narrative   Divorced. Education:  College. Exercise: walking 3 times a week for 30 minutes.   Retired Pharmacist, hospital   Social Determinants of Radio broadcast assistant Strain: [place]  (03/03/2022)   Overall Financial Resource Strain (CARDIA)    Difficulty of Paying Living Expenses: Not hard at all  Food Insecurity: No Food Insecurity (03/03/2022)   Hunger Vital Sign    Worried About Running Out of Food in the Last Year: Never true    Ran Out of Food in the Last Year: Never true  Transportation Needs: No Transportation Needs (03/03/2022)   PRAPARE - Hydrologist (Medical): No    Lack of Transportation (Non-Medical): No  Physical Activity: Sufficiently Active (03/03/2022)   Exercise Vital Sign    Days of Exercise per Week: 4 days    Minutes of Exercise per Session: 50 min  Stress: No Stress Concern Present (03/03/2022)   [place]    Feeling of Stress : Not at all  Social Connections: Moderately Integrated (03/03/2022)   Social Connection and Isolation Panel [NHANES]    Frequency of Communication with Friends and Family: Twice a week    Frequency of Social Gatherings  with Friends and Family: Twice a week    Attends Religious Services: More than 4 times per year    Active Member of [employer] or Organizations: No    Attends Archivist Meetings: Never    Marital Status: Married  Human resources officer Violence: Not At Risk (03/03/2022)   Humiliation, Afraid, Rape, and Kick questionnaire    Fear of Current or Ex-Partner: No    Emotionally Abused: No    Physically Abused: No    Sexually Abused: No    Family History:   The patient's family history includes Heart disease in his father; Hyperlipidemia in his mother. There is no history of Colon cancer, Esophageal cancer, Stomach cancer, or Rectal cancer.     Physical Exam/Data:   Vitals:   02/05/23 0115 02/05/23 0130 02/05/23 0145 02/05/23 0315  BP: (!) 142/70 118/65 (!) 140/73 127/65  Pulse: 77 69 71 69  Resp: 13 19 (!) 21 19  SpO2: 96% 93% 92% 96%  Weight:      Height:       No intake or output data in the 24 hours ending 02/05/23 0323    02/04/2023   11:59 PM 01/30/2023   10:06 AM 11/17/2022   11:08 AM  Last 3 Weights  Weight (lbs) 186 lb 8.2 oz 186 lb 6.4 oz 187 lb 6.4 oz  Weight (kg) 84.6 kg 84.55 kg 85.004 kg     Body mass index is 31.04 kg/m.  General:  NAD HEENT: normal Lymph: no adenopathy Neck: JVP 8cm H2O Endocrine:  No thryomegaly Vascular: No carotid bruits; FA pulses 2+ bilaterally without bruits  Cardiac:  normal S1, S2; RRR; no m/r/g. Lungs:  clear to auscultation bilaterally, no wheezing, rhonchi or rales  Abd: soft, nontender, no hepatomegaly  Ext: WWP. No LEE.  Musculoskeletal:  No deformities, BUE and BLE strength normal and equal Skin: warm and dry  Neuro:  CNs 2-12 intact, no focal abnormalities noted Psych:  Normal affect   EKG:  The ECG that was done 3/10 at 0056 shows monomorphic VT with failed ATP.  Relevant CV Studies: TTE 10/10/22: 1. Left ventricular ejection fraction, by estimation, is 55 to 60%. Left ventricular ejection fraction by 3D volume is 58 %. The  left ventricle has normal function.  The left ventricle has no regional wall motion abnormalities. Left ventricular diastolic  parameters are consistent with Grade I diastolic dysfunction (impaired  relaxation).   2. Right ventricular systolic function is normal. The right ventricular size is normal.   3. Left atrial size was mildly dilated.   4. The mitral valve is normal in structure. No evidence of mitral valve regurgitation. No evidence of mitral stenosis.   5. The aortic valve is tricuspid. Aortic valve regurgitation is not visualized. No aortic stenosis is present.   6. The inferior vena cava is normal in size with greater than 50%  respiratory variability, suggesting right atrial pressure of 3 mmHg.    Laboratory Data:  High Sensitivity Troponin:  No results for input(s): "TROPONINIHS" in the last 720 hours.    Chemistry Recent Labs  Lab 02/05/23 0005  NA 136  K 3.7  CL 103  CO2 18*  GLUCOSE 173*  BUN 19  CREATININE 1.29*  CALCIUM 9.1  GFRNONAA 59*  ANIONGAP 15    Recent Labs  Lab 02/05/23 0005  PROT 6.6  ALBUMIN 3.9  AST 32  ALT 24  ALKPHOS 42  BILITOT 0.9   Hematology Recent Labs  Lab 02/05/23 0005  WBC 6.5  RBC 4.97  HGB 14.9  HCT 43.3  MCV 87.1  MCH 30.0  MCHC 34.4  RDW 13.2  PLT 160   BNP Recent Labs  Lab 02/05/23 0005  BNP 134.3*    DDimer No results for input(s): "DDIMER" in the last 168 hours.  Radiology/Studies:  No results found. {   Assessment and Plan:   #VT Storm Patient presents with recurrent ICD discharges despite recent increase in amiodarone dosing and initiation of amio load in ED. Medtronic device interrogated in ED at bedside, showing 8 appropriate shocks for VT (5 since arriving in ED). There have been 57 total treated episodes, majority with successful ATP, since March 4th.  Dx: - CTA vs coronary angiography to assess for underlying ischemic etiology driving recurrent VT.  - Repeat TFTs - Repeat LFTs Tx: - Continue  amiodarone infusion - Lidocaine bolus + infusion  - Replete K>4, Mg>2 - Metoprolol succinate, '100mg'$  BID - Continue ASA '81mg'$  QD - Holding home PO amio - Holding home mexiletine  #T2DM Baseline A1c 8-9%.  - Regular insulin sliding scale - Hold home Jardiance  #HLD Most recent lipid panel 11/17/22: TC 119, TG 505, LDL 35, HDL 30 - Continue atorvastatin '10mg'$  QD  #HTN - Continue home Benicar.  #GERD - Continue famotidine '20mg'$  BID PRN  #OSA - Continue nightly CPAP   Severity of Illness: The appropriate patient status for this patient is INPATIENT. Inpatient status is judged to be reasonable and necessary in order to provide the required intensity of service to ensure the patient's safety. The patient's presenting symptoms, physical exam findings, and initial radiographic and laboratory data in the context of their chronic comorbidities is felt to place them at high risk for further clinical deterioration. Furthermore, it is not anticipated that the patient will be medically stable for discharge from the hospital within 2 midnights of admission.   * I certify that at the point of admission it is my clinical judgment that the patient will require inpatient hospital care spanning beyond 2 midnights from the point of admission due to high intensity of service, high risk for further deterioration and high frequency of surveillance required.*   For questions or updates, please contact [place] Please consult [email or web address] for contact info  under     Signed, Delorse Limber, MD  02/05/2023 3:23 AM

## 2023-02-05 NOTE — H&P (View-Only) (Signed)
Assessment and plan full note to follow  Ventricular tachycardia-incessant-enhanced automaticity>> left bundle inferior axis with an r in lead aVL S  Cardiomyopathy previously nonischemic  VT storm with multiple therapies including multiple shocks  ICD-Medtronic  Amiodarone therapy  Facial flushing  VT storm with a nearly incessant VT hands automaticity and morphology that appears to be emanating near the outflow tract, (a rS is not expected from a somewhat ventricular tachycardia) but it may well be, if catheterization is negative, see below, that this tachycardia amenable to ablation.  The incessant nature suggest automaticity, interestingly, it has been paced terminated on multiple occasions suggesting reentry also.  Ischemia to do this and the response to lidocaine is suggested that this would be the mechanism of the automaticity>> we have discussed catheterization and we will anticipate that tomorrow.  Facial flushing is infrequently reported with amiodarone more commonly reported with lidocaine.  It is probably medial given the fact that it is temporally associated with elevations in blood pressure; as I reviewed with him and his wife however, right now the ventricular tachycardia takes precedence over the flushing we will continue his medications.  In the event that he has more ventricular tachycardia I will rebolus him with both amiodarone and lidocaine and increase the drip of the lidocaine to 2 mg/min.  He will need lidocaine level in the morning.

## 2023-02-05 NOTE — ED Notes (Signed)
IP RN stated she can take pt in 15 min. Pt will be transported at 9:50

## 2023-02-05 NOTE — ED Notes (Signed)
Dr. Virl Axe at bedside stated to give pt one time dose of 1 mg Ativan PO. Verbal read back confirmed.

## 2023-02-05 NOTE — ED Notes (Signed)
RN discussed plan of care with spouse.  Spouse stated she would like an update from providers if she isn't at the bedside.

## 2023-02-05 NOTE — ED Notes (Signed)
Pt provided water.  

## 2023-02-05 NOTE — ED Notes (Signed)
ED TO INPATIENT HANDOFF REPORT  ED Nurse Name and Phone #: Juel Burrow [phone]  S Name/Age/Gender Dakota Gutierrez 72 y.o. male Room/Bed: TRAAC/TRAAC  Code Status   Code Status: Full Code  Home/SNF/Other Home Patient oriented to: self, place, time, and situation Is this baseline? Yes   Triage Complete: Triage complete  Chief Complaint Non-sustained ventricular tachycardia ([place]) [I47.29] VT (ventricular tachycardia) ([place]) [I47.20]  Triage Note Per EMS, pt from home, has been shocked twice by his defibrillator tonight.  He is going in and out of wide tachycardia.  He "sometimes feels it and some times does not."  No Chest pain other than shocks by defibrillator.    '150mg'$  Amiodarone - stabilized for a short while  On 2nd dose of Amiodarone '150mg'$  dose now.  144/92 70 - atrial paced 18 RR 166 CBG  18G R forearm 18 G L AC   Allergies Allergies  Allergen Reactions   Adhesive [Tape]     Swelling and welts formed    Level of Care/Admitting Diagnosis ED Disposition     ED Disposition  Admit   Condition  --   [place]: [place] [[id number]]  Level of Care: Progressive [102]  Admit to Progressive based on following criteria: CARDIOVASCULAR & THORACIC of moderate stability with acute coronary syndrome symptoms/low risk myocardial infarction/hypertensive urgency/arrhythmias/heart failure potentially compromising stability and stable post cardiovascular intervention patients.  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   Vitreomacular adhesion of right eye 02/15/2021   Past Surgical History:  Procedure Laterality Date   CARDIAC DEFIBRILLATOR PLACEMENT     Medtronic [id number] Protecta   CATARACT EXTRACTION, BILATERAL  2019   CHOLECYSTECTOMY     COLONOSCOPY WITH PROPOFOL N/A 06/30/2017   Procedure: COLONOSCOPY WITH PROPOFOL;  Surgeon: Carol Ada, MD;  Location: [facility];  Service: Endoscopy;  Laterality: N/A;   GALLBLADDER SURGERY     HERNIA REPAIR     ICD GENERATOR CHANGEOUT N/A 04/26/2017   Procedure: ICD Generator Changeout;  Surgeon: [name], MD;  Location: [facility];  Service: Cardiovascular;  Laterality: N/A;   TONSILLECTOMY AND ADENOIDECTOMY       A IV Location/Drains/Wounds Patient Lines/Drains/Airways Status     Active Line/Drains/Airways     Name Placement date Placement time Site Days   Peripheral IV 02/05/23 18 G Left Antecubital 02/05/23  0015  Antecubital  less than 1   Peripheral IV 02/05/23 18 G Right Forearm 02/05/23  --  Forearm  less than 1            Intake/Output Last 24 hours  Intake/Output Summary (Last 24 hours) at 02/05/2023 0914 Last data filed at 02/05/2023 0805 Gross per 24 hour  Intake --  Output 500 ml  Net -500 ml    Labs/Imaging Results for orders placed or performed during the hospital encounter of 02/04/23 (from the past 48 hour(s))  CBC with Differential     Status: Abnormal    Collection Time: 02/05/23 12:05 AM  Result Value Ref Range   WBC 6.5 4.0 - 10.5 K/uL   RBC 4.97 4.22 - 5.81 MIL/uL   Hemoglobin 14.9 13.0 - 17.0 g/dL   HCT 43.3 39.0 - 52.0 %   MCV 87.1 80.0 - 100.0 fL   MCH 30.0 26.0 - 34.0 pg   MCHC 34.4 30.0 - 36.0 g/dL   RDW 13.2 11.5 - 15.5 %   Platelets 160 150 - 400 K/uL   nRBC 0.0 0.0 - 0.2 %   Neutrophils Relative % 66 %   Neutro Abs 4.4 1.7 - 7.7 K/uL   Lymphocytes Relative 18 %   Lymphs Abs 1.1 0.7 - 4.0 K/uL   Monocytes Relative 10 %   Monocytes Absolute 0.7 0.1 - 1.0 K/uL   Eosinophils Relative 3 %   Eosinophils Absolute 0.2 0.0 - 0.5 K/uL   Basophils Relative 1 %   Basophils Absolute 0.0 0.0 - 0.1 K/uL   Immature Granulocytes 2 %   Abs Immature Granulocytes 0.12 (H) 0.00 - 0.07 K/uL    Comment: Performed at [facility], [address], [place], [place] [address]  Comprehensive metabolic panel     Status: Abnormal   Collection Time: 02/05/23 12:05 AM  Result Value Ref Range   Sodium 136 135 - 145 mmol/L   Potassium 3.7 3.5 - 5.1 mmol/L   Chloride 103 98 - 111 mmol/L   CO2 18 (L) 22 - 32 mmol/L   Glucose, Bld 173 (H) 70 - 99 mg/dL    Comment: Glucose reference range applies only to samples taken after fasting for at least 8 hours.   BUN 19 8 - 23 mg/dL   Creatinine, Ser 1.29 (H) 0.61 - 1.24 mg/dL   Calcium 9.1 8.9 - 10.3 mg/dL   Total Protein 6.6 6.5 - 8.1 g/dL   Albumin 3.9 3.5 - 5.0 g/dL   AST 32 15 - 41 U/L   ALT 24 0 - 44 U/L   Alkaline Phosphatase 42 38 - 126 U/L   Total Bilirubin 0.9 0.3 - 1.2 mg/dL   GFR, Estimated 59 (L) >60 mL/min    Comment: (NOTE) Calculated using the CKD-EPI Creatinine Equation (2021)    Anion gap 15 5 - 15    Comment: Performed at [facility] [address] [place], [place] [address]  Magnesium     Status: None   Collection Time: 02/05/23 12:05 AM  Result Value Ref Range   Magnesium 2.1 1.7 - 2.4 mg/dL    Comment: Performed at [facility], [place] [address]  [place], [place] [address]  Brain natriuretic peptide     Status: Abnormal   Collection Time: 02/05/23 12:05 AM  Result Value Ref Range   B Natriuretic Peptide 134.3 (H) 0.0 - 100.0 pg/mL    Comment: Performed at [facility] [address] [place], [place] [address]   DG Chest Port 1 View  Result Date: 02/05/2023 CLINICAL DATA:  vtach EXAM: PORTABLE CHEST 1 VIEW COMPARISON:  None Available. FINDINGS: Left chest wall dual lead pacemaker defibrillator. The heart and mediastinal contours are within normal limits. Aortic calcification. Bibasilar atelectasis. Query retrocardiac airspace opacity. No pulmonary edema. No pleural effusion. No pneumothorax. No acute osseous abnormality. IMPRESSION: 1. Low lung volumes with query retrocardiac airspace opacity. Recommend repeat x-ray PA and lateral view of the chest. 2.  Aortic Atherosclerosis (ICD10-I70.0). Electronically Signed   By: Iven Finn M.D.   On: 02/05/2023 00:27    Pending Labs Unresulted Labs (From admission, onward)     Start     Ordered   02/05/23 0445  Hepatic function panel  Once,   R        02/05/23 0444   Signed and Held  TSH  Once,   R        Signed and Held   Signed and Held  T4, free  Once,   R        Signed and Held   Signed and Occupational hygienist morning,   R        Signed and Held   Signed and Held  Lipid panel  Tomorrow morning,   R        Signed and Held   Signed and Held  CBC  Tomorrow morning,   R        Signed and Held   Signed and Held  CBC  (Pharmacologic VTE prophylaxis)  Once,   R       Comments: Baseline for enoxaparin therapy IF NOT ALREADY DRAWN.  Notify MD if PLT < 100 K.    Signed and Held   Signed and Held  Creatinine, serum  (Pharmacologic VTE prophylaxis)  Once,   R       Comments: Baseline for enoxaparin therapy IF NOT ALREADY DRAWN.    Signed and Held   Signed and Held  Creatinine, serum  (Pharmacologic VTE prophylaxis)  Weekly,   R     Comments: while on enoxaparin  therapy    Signed and Held            Vitals/Pain Today's Vitals   02/05/23 0710 02/05/23 0800 02/05/23 0804 02/05/23 0804  BP:  128/71    Pulse:  64    Resp:  (!) 23    Temp:   97.7 F (36.5 C)   TempSrc:   Oral   SpO2:  94%    Weight:      Height:      PainSc: 0-No pain   0-No pain    Isolation Precautions No active isolations  Medications Medications  amiodarone (NEXTERONE PREMIX) 360-4.14 MG/200ML-% (1.8 mg/mL) IV infusion (0 mg/hr Intravenous Stopped 02/05/23 0615)  amiodarone (NEXTERONE PREMIX) 360-4.14 MG/200ML-% (1.8 mg/mL) IV infusion (30 mg/hr Intravenous Rate/Dose Verify 02/05/23 0728)  lidocaine (XYLOCAINE) 4 mg/mL bolus via infusion 100 mg (100 mg Intravenous Bolus from Bag 02/05/23 0417)    And  lidocaine (cardiac) 2000 mg in dextrose 5% 500 mL ('4mg'$ /mL) IV infusion (1 mg/min Intravenous Rate/Dose Verify 02/05/23 0729)  magnesium sulfate IVPB 2 g 50 mL (0 g Intravenous Stopped 02/05/23 0141)  fentaNYL (SUBLIMAZE) injection 100 mcg (50 mcg Intravenous Given 02/05/23 0108)  potassium chloride 10 mEq in 100 mL IVPB (0 mEq Intravenous Stopped 02/05/23 0521)  potassium chloride SA (KLOR-CON M) CR tablet 40 mEq (40 mEq Oral Given 02/05/23 0423)  LORazepam (ATIVAN) tablet 1 mg (1 mg Oral Given 02/05/23 0851)  Mobility walks     Focused Assessments    R Recommendations: See Admitting Provider Note  Report given to:   Additional Notes:

## 2023-02-05 NOTE — ED Notes (Signed)
Ptient's defibrillator fired , EDP notified. Denies chest pain/respirations unlabored .

## 2023-02-06 ENCOUNTER — Encounter (HOSPITAL_COMMUNITY): Payer: Self-pay | Admitting: Cardiovascular Disease

## 2023-02-06 ENCOUNTER — Encounter (HOSPITAL_COMMUNITY)
Admission: EM | Disposition: A | Discharge status: Home / Self Care | Payer: Self-pay | Source: Home / Self Care | Attending: Internal Medicine

## 2023-02-06 ENCOUNTER — Other Ambulatory Visit (HOSPITAL_COMMUNITY): Payer: Self-pay

## 2023-02-06 DIAGNOSIS — I2510 Atherosclerotic heart disease of native coronary artery without angina pectoris: Secondary | ICD-10-CM | POA: Diagnosis not present

## 2023-02-06 DIAGNOSIS — I4729 Other ventricular tachycardia: Secondary | ICD-10-CM | POA: Diagnosis not present

## 2023-02-06 HISTORY — PX: INTRAVASCULAR PRESSURE WIRE/FFR STUDY: CATH118243

## 2023-02-06 HISTORY — PX: LEFT HEART CATH AND CORONARY ANGIOGRAPHY: CATH118249

## 2023-02-06 HISTORY — PX: CORONARY STENT INTERVENTION: CATH118234

## 2023-02-06 LAB — GLUCOSE, CAPILLARY
Glucose-Capillary: 132 mg/dL — ABNORMAL HIGH (ref 70–99)
Glucose-Capillary: 157 mg/dL — ABNORMAL HIGH (ref 70–99)
Glucose-Capillary: 156 mg/dL — ABNORMAL HIGH (ref 70–99)
Glucose-Capillary: 191 mg/dL — ABNORMAL HIGH (ref 70–99)

## 2023-02-06 LAB — POCT ACTIVATED CLOTTING TIME
Activated Clotting Time: 244 seconds
Activated Clotting Time: 288 seconds
Activated Clotting Time: 309 seconds

## 2023-02-06 LAB — BASIC METABOLIC PANEL
Anion gap: 11 (ref 5–15)
BUN: 16 mg/dL (ref 8–23)
CO2: 22 mmol/L (ref 22–32)
Calcium: 8.5 mg/dL — ABNORMAL LOW (ref 8.9–10.3)
Chloride: 102 mmol/L (ref 98–111)
Creatinine, Ser: 1.04 mg/dL (ref 0.61–1.24)
GFR, Estimated: >60 mL/min (ref >60)
Glucose, Bld: 144 mg/dL — ABNORMAL HIGH (ref 70–99)
Potassium: 3.8 mmol/L (ref 3.5–5.1)
Sodium: 135 mmol/L (ref 135–145)

## 2023-02-06 SURGERY — LEFT HEART CATH AND CORONARY ANGIOGRAPHY
Anesthesia: LOCAL

## 2023-02-06 MED ORDER — IOHEXOL 350 MG/ML SOLN
INTRAVENOUS | Status: DC | PRN
  Administered 2023-02-06: 150 mL

## 2023-02-06 MED ORDER — HYDRALAZINE HCL 20 MG/ML IJ SOLN: 10.0000 mg | INTRAMUSCULAR | Status: AC | PRN

## 2023-02-06 MED ORDER — SODIUM CHLORIDE 0.9 % IV SOLN: INTRAVENOUS | Status: AC

## 2023-02-06 MED ORDER — HEPARIN (PORCINE) IN NACL 1000-0.9 UT/500ML-% IV SOLN
INTRAVENOUS | Status: DC | PRN
  Administered 2023-02-06 (×2): 500 mL

## 2023-02-06 MED ORDER — HEPARIN (PORCINE) 25000 UT/250ML-% IV SOLN: 1100.0000 [IU]/h | INTRAVENOUS | Status: DC

## 2023-02-06 MED ORDER — LORAZEPAM 1 MG PO TABS
1.0000 mg | ORAL_TABLET | Freq: Once | ORAL | Status: DC
  Filled 2023-02-06: qty 1

## 2023-02-06 MED ORDER — ATORVASTATIN CALCIUM 80 MG PO TABS
80.0000 mg | ORAL_TABLET | Freq: Every day | ORAL | Status: DC
  Administered 2023-02-06 – 2023-02-09 (×4): 80 mg via ORAL
  Filled 2023-02-06 (×4): qty 1

## 2023-02-06 MED ORDER — SODIUM CHLORIDE 0.9% FLUSH
3.0000 mL | Freq: Two times a day (BID) | INTRAVENOUS | Status: DC
  Administered 2023-02-06 – 2023-02-07 (×2): 3 mL via INTRAVENOUS

## 2023-02-06 MED ORDER — HEPARIN SODIUM (PORCINE) 1000 UNIT/ML IJ SOLN
INTRAMUSCULAR | Status: AC
  Filled 2023-02-06: qty 10

## 2023-02-06 MED ORDER — FENTANYL CITRATE (PF) 100 MCG/2ML IJ SOLN
INTRAMUSCULAR | Status: DC | PRN
  Administered 2023-02-06 (×2): 25 ug via INTRAVENOUS

## 2023-02-06 MED ORDER — ALPRAZOLAM 0.25 MG PO TABS
0.2500 mg | ORAL_TABLET | Freq: Once | ORAL | Status: AC | PRN
  Administered 2023-02-07: 0.25 mg via ORAL
  Filled 2023-02-06: qty 1

## 2023-02-06 MED ORDER — TICAGRELOR 90 MG PO TABS
90.0000 mg | ORAL_TABLET | Freq: Two times a day (BID) | ORAL | Status: DC
  Administered 2023-02-06 – 2023-02-09 (×6): 90 mg via ORAL
  Filled 2023-02-06 (×7): qty 1

## 2023-02-06 MED ORDER — FENTANYL CITRATE (PF) 100 MCG/2ML IJ SOLN
INTRAMUSCULAR | Status: AC
  Filled 2023-02-06: qty 2

## 2023-02-06 MED ORDER — MIDAZOLAM HCL 2 MG/2ML IJ SOLN
INTRAMUSCULAR | Status: AC
  Filled 2023-02-06: qty 2

## 2023-02-06 MED ORDER — LIDOCAINE HCL (PF) 1 % IJ SOLN
INTRAMUSCULAR | Status: DC | PRN
  Administered 2023-02-06: 2 mL

## 2023-02-06 MED ORDER — ACETAMINOPHEN 325 MG PO TABS
650.0000 mg | ORAL_TABLET | Freq: Three times a day (TID) | ORAL | Status: DC | PRN
  Administered 2023-02-08: 650 mg via ORAL
  Filled 2023-02-06: qty 2

## 2023-02-06 MED ORDER — ONDANSETRON HCL 4 MG/2ML IJ SOLN: 4.0000 mg | Freq: Four times a day (QID) | INTRAMUSCULAR | Status: DC | PRN

## 2023-02-06 MED ORDER — LABETALOL HCL 5 MG/ML IV SOLN
10.0000 mg | INTRAVENOUS | Status: AC | PRN
  Administered 2023-02-06: 10 mg via INTRAVENOUS
  Filled 2023-02-06: qty 4

## 2023-02-06 MED ORDER — HYDRALAZINE HCL 20 MG/ML IJ SOLN
10.0000 mg | INTRAMUSCULAR | Status: AC | PRN
  Administered 2023-02-06: 10 mg via INTRAVENOUS
  Filled 2023-02-06: qty 1

## 2023-02-06 MED ORDER — SODIUM CHLORIDE 0.9% FLUSH: 3.0000 mL | INTRAVENOUS | Status: DC | PRN

## 2023-02-06 MED ORDER — TICAGRELOR 90 MG PO TABS
ORAL_TABLET | ORAL | Status: DC | PRN
  Administered 2023-02-06: 180 mg via ORAL

## 2023-02-06 MED ORDER — TICAGRELOR 90 MG PO TABS
ORAL_TABLET | ORAL | Status: AC
  Filled 2023-02-06: qty 2

## 2023-02-06 MED ORDER — VERAPAMIL HCL 2.5 MG/ML IV SOLN
INTRA_ARTERIAL | Status: DC | PRN
  Administered 2023-02-06: 15 mL via INTRA_ARTERIAL
  Administered 2023-02-06: 5 mL via INTRA_ARTERIAL

## 2023-02-06 MED ORDER — VERAPAMIL HCL 2.5 MG/ML IV SOLN
INTRAVENOUS | Status: AC
  Filled 2023-02-06: qty 2

## 2023-02-06 MED ORDER — HEPARIN (PORCINE) 25000 UT/250ML-% IV SOLN
1250.0000 [IU]/h | INTRAVENOUS | Status: DC
  Administered 2023-02-06: 1100 [IU]/h via INTRAVENOUS
  Administered 2023-02-07: 1250 [IU]/h via INTRAVENOUS
  Filled 2023-02-06 (×2): qty 250

## 2023-02-06 MED ORDER — LIDOCAINE HCL (PF) 1 % IJ SOLN
INTRAMUSCULAR | Status: AC
  Filled 2023-02-06: qty 30

## 2023-02-06 MED ORDER — ASPIRIN 81 MG PO CHEW: 81.0000 mg | CHEWABLE_TABLET | Freq: Every day | ORAL | Status: DC

## 2023-02-06 MED ORDER — NITROGLYCERIN 1 MG/10 ML FOR IR/CATH LAB
INTRA_ARTERIAL | Status: AC
  Filled 2023-02-06: qty 10

## 2023-02-06 MED ORDER — MORPHINE SULFATE (PF) 2 MG/ML IV SOLN: 2.0000 mg | INTRAVENOUS | Status: DC | PRN

## 2023-02-06 MED ORDER — MIDAZOLAM HCL 2 MG/2ML IJ SOLN
INTRAMUSCULAR | Status: DC | PRN
  Administered 2023-02-06: 1 mg via INTRAVENOUS

## 2023-02-06 MED ORDER — SODIUM CHLORIDE 0.9 % IV SOLN: 250.0000 mL | INTRAVENOUS | Status: DC | PRN

## 2023-02-06 MED ORDER — HEPARIN SODIUM (PORCINE) 1000 UNIT/ML IJ SOLN
INTRAMUSCULAR | Status: DC | PRN
  Administered 2023-02-06: 4000 [IU] via INTRAVENOUS
  Administered 2023-02-06: 5000 [IU] via INTRAVENOUS
  Administered 2023-02-06: 4500 [IU] via INTRAVENOUS
  Administered 2023-02-06: 2000 [IU] via INTRAVENOUS

## 2023-02-06 MED ORDER — ACETAMINOPHEN 325 MG PO TABS: 650.0000 mg | ORAL_TABLET | ORAL | Status: DC | PRN

## 2023-02-06 MED ORDER — LABETALOL HCL 5 MG/ML IV SOLN: 10.0000 mg | INTRAVENOUS | Status: AC | PRN

## 2023-02-06 SURGICAL SUPPLY — 21 items
BALLN EMERGE MR 2.0X12 (BALLOONS) ×1
BALLN ~~LOC~~ EMERGE MR 2.75X15 (BALLOONS) ×1
BALLOON EMERGE MR 2.0X12 (BALLOONS) IMPLANT
BALLOON ~~LOC~~ EMERGE MR 2.75X15 (BALLOONS) IMPLANT
CATH 5FR JL3.5 JR4 ANG PIG MP (CATHETERS) IMPLANT
CATH VISTA GUIDE 6FR XB3 (CATHETERS) IMPLANT
DEVICE RAD COMP TR BAND LRG (VASCULAR PRODUCTS) IMPLANT
GLIDESHEATH SLEND A-KIT 6F 22G (SHEATH) IMPLANT
GLIDESHEATH SLEND SS 6F .021 (SHEATH) IMPLANT
GUIDEWIRE INQWIRE 1.5J.035X260 (WIRE) IMPLANT
GUIDEWIRE PRESSURE X 175 (WIRE) IMPLANT
INQWIRE 1.5J .035X260CM (WIRE) ×1
KIT ENCORE 26 ADVANTAGE (KITS) IMPLANT
KIT ESSENTIALS PG (KITS) IMPLANT
KIT HEART LEFT (KITS) ×1 IMPLANT
PACK CARDIAC CATHETERIZATION (CUSTOM PROCEDURE TRAY) ×1 IMPLANT
STENT ONYX FRONTIER 2.5X30 (Permanent Stent) IMPLANT
TRANSDUCER W/STOPCOCK (MISCELLANEOUS) ×1 IMPLANT
TUBING CIL FLEX 10 FLL-RA (TUBING) ×1 IMPLANT
WIRE ASAHI PROWATER 180CM (WIRE) IMPLANT
WIRE HI TORQ VERSACORE-J 145CM (WIRE) IMPLANT

## 2023-02-06 NOTE — Progress Notes (Signed)
Patient with significant anxiety overnight with elevated blood pressures (SBP ~180s). Given IV labetalol and hydralazine with 0.25 mg of xanax.   Libby Maw, MD MS  Cardiology

## 2023-02-06 NOTE — Progress Notes (Addendum)
Patient Name: Dakota Gutierrez Date of Encounter: 02/06/2023  Primary Cardiologist: None Electrophysiologist: None  Interval Summary   NAEON.  Did not sleep well overnight d/t anxiety about further shocks. NPO for LHC today  Inpatient Medications    Scheduled Meds:  [START ON 02/07/2023] aspirin EC  81 mg Oral Daily   atorvastatin  10 mg Oral Daily   enoxaparin (LOVENOX) injection  40 mg Subcutaneous Q24H   gabapentin  100 mg Oral QHS   insulin aspart  0-15 Units Subcutaneous TID WC   LORazepam  1 mg Oral Once   metoprolol succinate  100 mg Oral BID   sodium chloride flush  3 mL Intravenous Q12H   Continuous Infusions:  sodium chloride     sodium chloride 1 mL/kg/hr (02/06/23 0518)   amiodarone 30 mg/hr (02/06/23 0601)   lidocaine 1 mg/min (02/06/23 0721)   PRN Meds: sodium chloride, acetaminophen, ondansetron (ZOFRAN) IV, sodium chloride flush   Vital Signs    Vitals:   02/05/23 1828 02/05/23 1933 02/05/23 2333 02/06/23 0332  BP: (!) 148/76 (!) 144/80 (!) 170/79 (!) 158/80  Pulse:  75 74 73  Resp:  18  18  Temp:  98.4 F (36.9 C)  98.4 F (36.9 C)  TempSrc:  Oral  Oral  SpO2:  98% 96% 97%  Weight:      Height:        Intake/Output Summary (Last 24 hours) at 02/06/2023 0726 Last data filed at 02/06/2023 0518 Gross per 24 hour  Intake 995.53 ml  Output 1450 ml  Net -454.47 ml   Filed Weights   02/04/23 2359  Weight: 84.6 kg    Physical Exam    GEN- The patient is well appearing, alert and oriented x 3 today.   Lungs- Clear to ausculation bilaterally, normal work of breathing Cardiac- Regular rate and rhythm, no murmurs, rubs or gallops GI- soft, NT, ND, + BS Extremities- no clubbing or cyanosis. No edema  Telemetry    A-paced 60-70s (personally reviewed)  Hospital Course    Dakota Gutierrez is a 72 y.o. 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   No notes on file  Assessment & Plan    #) VT storm with multiple therapies #) ICM Incessant at admission Lidocaine bolus and gtt - lidocaine level today Amiodarone gtt continues LHC planned for today NPO for procedure      For questions or updates, please contact [place] Please consult [email or web address] for contact info under Cardiology/STEMI.  Signed, Mamie Levers, NP  02/06/2023, 7:26 AM   Ventricular tachycardia-incessant-enhanced automaticity>> left bundle inferior axis with an r in lead aVL S   Cardiomyopathy previously nonischemic most recently with recovered ejection fraction   VT storm with multiple therapies including multiple shocks  PTSD   ICD-Medtronic   Amiodarone therapy   Facial flushing  Hypertension  Patient for cath this morning.  Ventricular tachycardia mechanistically appears automatic somewhat surprisingly terminating at least on some occasions with ATP.  If ischemia is not the mechanism, have discussed with the patient and the team about mapping and ablation as a next step.  The response to lidocaine as noted previously supports the possibility of ischemia even if it is not epicardial.  Following the procedure today we will make a decision regarding transition from lidocaine to oral therapy with mexiletine  Agree with checking lidocaine level.  Continue anxiolytics.  Metoprolol 100 twice daily

## 2023-02-06 NOTE — TOC Benefit Eligibility Note (Signed)
Patient Teacher, English as a foreign language completed.    The patient is currently admitted and upon discharge could be taking Brilinta 90 mg.  The current 30 day co-pay is $40.00.   The patient is insured through [place], [place] Patient Advocate Specialist [place] Patient Advocate Team Direct Number: [phone]  Fax: [phone]

## 2023-02-06 NOTE — Progress Notes (Addendum)
ANTICOAGULATION CONSULT NOTE - Initial Consult  Pharmacy Consult for heparin Indication: chest pain/ACS  Allergies  Allergen Reactions   Adhesive [Tape]     Swelling and welts formed    Patient Measurements: Height: '5\' 5"'$  (165.1 cm) Weight: 84.6 kg (186 lb 8.2 oz) IBW/kg (Calculated) : 61.5 HEPARIN DW (KG): 79.2   Vital Signs: Temp: 98.3 F (36.8 C) (03/11 1105) Temp Source: Oral (03/11 1105) BP: 139/82 (03/11 1105) Pulse Rate: 74 (03/11 1105)  Labs: Recent Labs    02/05/23 0005 02/06/23 0733  HGB 14.9  --   HCT 43.3  --   PLT 160  --   CREATININE 1.29* 1.04    Estimated Creatinine Clearance: 63.3 mL/min (by C-G formula based on SCr of 1.04 mg/dL).   Medical History: Past Medical History:  Diagnosis Date   Anxiety    secondary to ICD shocks   Diabetes mellitus without complication ([place])    Dual implantable cardiac defibrillator in situ    Medtronic [id number] Protecta   Elevated transaminase level    10/2011  ? 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   Past Week   amiodarone (PACERONE) 100 MG tablet Take 1 tablet (100 mg total) by mouth 2 (two) times daily. 180 tablet 3 02/04/2023   aspirin EC 81 MG tablet Take 1 tablet (81 mg total) by mouth daily.   02/04/2023   atorvastatin (LIPITOR) 10 MG tablet TAKE 1 TABLET BY MOUTH EVERY DAY 90 tablet 2 02/04/2023   Cholecalciferol (VITAMIN D) 2000 UNITS tablet Take 2,000 Units by mouth daily.   02/04/2023   empagliflozin (JARDIANCE) 25 MG TABS tablet  Take 1 tablet (25 mg total) by mouth daily before breakfast. 90 tablet 1 02/04/2023   famotidine (PEPCID) 20 MG tablet Take 1 tablet (20 mg total) by mouth 2 (two) times daily as needed. 60 tablet 4 02/04/2023   gabapentin (NEURONTIN) 100 MG capsule TAKE 1 CAPSULE BY MOUTH EVERYDAY AT BEDTIME 90 capsule 0 02/04/2023   metFORMIN (GLUCOPHAGE) 500 MG tablet Take 1 tablet (500 mg total) by mouth daily with breakfast. 90 tablet 3 02/04/2023   metoprolol succinate (TOPROL XL) 50 MG 24 hr tablet Take 1 tablet (50 mg total) by mouth daily. Take in addition to Toprol XL 100 mg.  100 mg in AM and 50 mg in PM PO. (Patient taking differently: Take 50 mg by mouth every evening. Take in addition to Toprol XL 100 mg.  100 mg in AM and 50 mg in PM PO.) 90 tablet 3 02/04/2023 at 21:30   metoprolol succinate (TOPROL-XL) 100 MG 24 hr tablet TAKE 1 TABLET BY MOUTH ONCE DAILY IMMEDIATELY  FOLLOWING  A  MEAL (Patient taking differently: Take 100 mg by mouth daily. IMMEDIATELY  FOLLOWING  A  MEAL) 90 tablet 3 02/04/2023   mexiletine (MEXITIL) 200 MG capsule Take 1 capsule (200 mg total) by mouth 2 (two) times daily. 180 capsule 0 02/04/2023   olmesartan-hydrochlorothiazide (BENICAR HCT) 20-12.5 MG  tablet TAKE 1 TABLET BY MOUTH EVERY DAY 90 tablet 2 02/04/2023   Omega-3 Fatty Acids (FISH OIL) 1000 MG CAPS Take 1,000 mg by mouth 2 (two) times daily.   02/04/2023   tadalafil (CIALIS) 20 MG tablet Take 0.5-1 tablets (10-20 mg total) by mouth daily as needed for erectile dysfunction. 10 tablet 11 Past Month   Scheduled:   [START ON 02/07/2023] aspirin  81 mg Oral Daily   [START ON 02/07/2023] aspirin EC  81 mg Oral Daily   atorvastatin  10 mg Oral Daily   atorvastatin  80 mg Oral Daily   enoxaparin (LOVENOX) injection  40 mg Subcutaneous Q24H   gabapentin  100 mg Oral QHS   insulin aspart  0-15 Units Subcutaneous TID WC   LORazepam  1 mg Oral Once   metoprolol succinate  100 mg Oral BID   sodium chloride flush  3 mL Intravenous Q12H   sodium  chloride flush  3 mL Intravenous Q12H   ticagrelor  90 mg Oral BID    Assessment: 72 yo male here with VT storm, he is noted s/p cath with DES to LAD and plans for staged PCI to RCA. Heparin to start 4 hours after sheath removal (removed 10:33am). No anticoagulants noted PTA  Goal of Therapy:  Heparin level 0.3-0.7 units/ml Monitor platelets by anticoagulation protocol: Yes   Plan:  -start heparin 1100 units/hr at 3pm -Heparin level in 8 hours and daily wth CBC daily  Hildred Laser, PharmD Clinical Pharmacist **Pharmacist phone directory can now be found on [email or web address] ([email or web address]).  Listed under [place].   Addendum -some bleeding noted at the sheath site -Will restart heparin at [place], PharmD Clinical Pharmacist **Pharmacist phone directory can now be found on [email or web address] ([email or web address]).  Listed under [place].

## 2023-02-06 NOTE — Interval H&P Note (Signed)
Cath Lab Visit (complete for each Cath Lab visit)  Clinical Evaluation Leading to the Procedure:   ACS: No.  Non-ACS:    Anginal Classification: No Symptoms  Anti-ischemic medical therapy: Minimal Therapy (1 class of medications)  Non-Invasive Test Results: No non-invasive testing performed  Prior CABG: No previous CABG      History and Physical Interval Note:  02/06/2023 9:03 AM  Dakota Gutierrez  has presented today for surgery, with the diagnosis of VT.  The various methods of treatment have been discussed with the patient and family. After consideration of risks, benefits and other options for treatment, the patient has consented to  Procedure(s): LEFT HEART CATH AND CORONARY ANGIOGRAPHY (N/A) as a surgical intervention.  The patient's history has been reviewed, patient examined, no change in status, stable for surgery.  I have reviewed the patient's chart and labs.  Questions were answered to the patient's satisfaction.     Quay Burow

## 2023-02-06 NOTE — H&P (View-Only) (Signed)
Patient Name: Dakota Gutierrez Date of Encounter: 02/06/2023  Primary Cardiologist: None Electrophysiologist: None  Interval Summary   NAEON.  Did not sleep well overnight d/t anxiety about further shocks. NPO for LHC today  Inpatient Medications    Scheduled Meds:  [START ON 02/07/2023] aspirin EC  81 mg Oral Daily   atorvastatin  10 mg Oral Daily   enoxaparin (LOVENOX) injection  40 mg Subcutaneous Q24H   gabapentin  100 mg Oral QHS   insulin aspart  0-15 Units Subcutaneous TID WC   LORazepam  1 mg Oral Once   metoprolol succinate  100 mg Oral BID   sodium chloride flush  3 mL Intravenous Q12H   Continuous Infusions:  sodium chloride     sodium chloride 1 mL/kg/hr (02/06/23 0518)   amiodarone 30 mg/hr (02/06/23 0601)   lidocaine 1 mg/min (02/06/23 0721)   PRN Meds: sodium chloride, acetaminophen, ondansetron (ZOFRAN) IV, sodium chloride flush   Vital Signs    Vitals:   02/05/23 1828 02/05/23 1933 02/05/23 2333 02/06/23 0332  BP: (!) 148/76 (!) 144/80 (!) 170/79 (!) 158/80  Pulse:  75 74 73  Resp:  18  18  Temp:  98.4 F (36.9 C)  98.4 F (36.9 C)  TempSrc:  Oral  Oral  SpO2:  98% 96% 97%  Weight:      Height:        Intake/Output Summary (Last 24 hours) at 02/06/2023 0726 Last data filed at 02/06/2023 0518 Gross per 24 hour  Intake 995.53 ml  Output 1450 ml  Net -454.47 ml   Filed Weights   02/04/23 2359  Weight: 84.6 kg    Physical Exam    GEN- The patient is well appearing, alert and oriented x 3 today.   Lungs- Clear to ausculation bilaterally, normal work of breathing Cardiac- Regular rate and rhythm, no murmurs, rubs or gallops GI- soft, NT, ND, + BS Extremities- no clubbing or cyanosis. No edema  Telemetry    A-paced 60-70s (personally reviewed)  Hospital Course    Dakota Gutierrez is a 72 y.o. 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   No notes on file  Assessment & Plan    #) VT storm with multiple therapies #) ICM Incessant at admission Lidocaine bolus and gtt - lidocaine level today Amiodarone gtt continues LHC planned for today NPO for procedure      For questions or updates, please contact [place] Please consult [email or web address] for contact info under Cardiology/STEMI.  Signed, Mamie Levers, NP  02/06/2023, 7:26 AM   Ventricular tachycardia-incessant-enhanced automaticity>> left bundle inferior axis with an r in lead aVL S   Cardiomyopathy previously nonischemic most recently with recovered ejection fraction   VT storm with multiple therapies including multiple shocks  PTSD   ICD-Medtronic   Amiodarone therapy   Facial flushing  Hypertension  Patient for cath this morning.  Ventricular tachycardia mechanistically appears automatic somewhat surprisingly terminating at least on some occasions with ATP.  If ischemia is not the mechanism, have discussed with the patient and the team about mapping and ablation as a next step.  The response to lidocaine as noted previously supports the possibility of ischemia even if it is not epicardial.  Following the procedure today we will make a decision regarding transition from lidocaine to oral therapy with mexiletine  Agree with checking lidocaine level.  Continue anxiolytics.  Metoprolol 100 twice daily

## 2023-02-06 NOTE — Care Management (Signed)
  Transition of Care [facility]) Screening Note   Patient Details  Name: Dakota Gutierrez Date of Birth: 1951-02-20   [facility] CM/SW Contact:    Bethena Roys, RN Phone Number: 02/06/2023, 1:51 PM    Transition of Care Department [facility]) has reviewed the patient and no TOC needs have been identified at this time. Patient post LHC-Benefits check has been completed for Brilinta $40.00. We will continue to monitor patient advancement through interdisciplinary progression rounds. If new patient transition needs arise, please place a TOC consult.

## 2023-02-07 ENCOUNTER — Inpatient Hospital Stay (HOSPITAL_COMMUNITY): Payer: Medicare PPO

## 2023-02-07 DIAGNOSIS — I4729 Other ventricular tachycardia: Secondary | ICD-10-CM | POA: Diagnosis not present

## 2023-02-07 DIAGNOSIS — I10 Essential (primary) hypertension: Secondary | ICD-10-CM | POA: Diagnosis not present

## 2023-02-07 LAB — CBC
HCT: 40.2 % (ref 39.0–52.0)
Hemoglobin: 13.9 g/dL (ref 13.0–17.0)
MCH: 29.7 pg (ref 26.0–34.0)
MCHC: 34.6 g/dL (ref 30.0–36.0)
MCV: 85.9 fL (ref 80.0–100.0)
Platelets: 148 10*3/uL — ABNORMAL LOW (ref 150–400)
RBC: 4.68 MIL/uL (ref 4.22–5.81)
RDW: 13.2 % (ref 11.5–15.5)
WBC: 7.5 10*3/uL (ref 4.0–10.5)
nRBC: 0 % (ref 0.0–0.2)

## 2023-02-07 LAB — BASIC METABOLIC PANEL
Anion gap: 10 (ref 5–15)
BUN: 15 mg/dL (ref 8–23)
CO2: 18 mmol/L — ABNORMAL LOW (ref 22–32)
Calcium: 8.3 mg/dL — ABNORMAL LOW (ref 8.9–10.3)
Chloride: 106 mmol/L (ref 98–111)
Creatinine, Ser: 1.08 mg/dL (ref 0.61–1.24)
GFR, Estimated: >60 mL/min (ref >60)
Glucose, Bld: 150 mg/dL — ABNORMAL HIGH (ref 70–99)
Potassium: 3.4 mmol/L — ABNORMAL LOW (ref 3.5–5.1)
Sodium: 134 mmol/L — ABNORMAL LOW (ref 135–145)

## 2023-02-07 LAB — GLUCOSE, CAPILLARY
Glucose-Capillary: 173 mg/dL — ABNORMAL HIGH (ref 70–99)
Glucose-Capillary: 153 mg/dL — ABNORMAL HIGH (ref 70–99)
Glucose-Capillary: 206 mg/dL — ABNORMAL HIGH (ref 70–99)
Glucose-Capillary: 153 mg/dL — ABNORMAL HIGH (ref 70–99)

## 2023-02-07 LAB — HEPARIN LEVEL (UNFRACTIONATED)
Heparin Unfractionated: 0.28 IU/mL — ABNORMAL LOW (ref 0.30–0.70)
Heparin Unfractionated: 0.32 IU/mL (ref 0.30–0.70)

## 2023-02-07 LAB — ECHOCARDIOGRAM COMPLETE
AR max vel: 1.96 cm2
AV Area VTI: 2.43 cm2
AV Area mean vel: 2.09 cm2
AV Mean grad: 3 mmHg
AV Peak grad: 7.1 mmHg
Ao pk vel: 1.33 m/s
Area-P 1/2: 5.09 cm2
Height: 65 in
S' Lateral: 3 cm
Single Plane A4C EF: 41.1 %
Weight: 2984.15 oz

## 2023-02-07 LAB — LIDOCAINE LEVEL
Lidocaine Lvl: 2 ug/mL (ref 1.5–5.0)
Lidocaine Lvl: 2 ug/mL (ref 1.5–5.0)

## 2023-02-07 MED ORDER — LIP MEDEX EX OINT
TOPICAL_OINTMENT | CUTANEOUS | Status: DC | PRN
  Administered 2023-02-07: 75 via TOPICAL
  Filled 2023-02-07: qty 7

## 2023-02-07 MED ORDER — SODIUM CHLORIDE 0.9% FLUSH
3.0000 mL | Freq: Two times a day (BID) | INTRAVENOUS | Status: DC
  Administered 2023-02-07 – 2023-02-08 (×2): 3 mL via INTRAVENOUS

## 2023-02-07 MED ORDER — ALPRAZOLAM 0.25 MG PO TABS
0.2500 mg | ORAL_TABLET | Freq: Every evening | ORAL | Status: DC | PRN
  Administered 2023-02-07 – 2023-02-08 (×2): 0.25 mg via ORAL
  Filled 2023-02-07 (×2): qty 1

## 2023-02-07 MED ORDER — ASPIRIN 81 MG PO CHEW
81.0000 mg | CHEWABLE_TABLET | ORAL | Status: AC
  Administered 2023-02-08: 81 mg via ORAL
  Filled 2023-02-07: qty 1

## 2023-02-07 MED ORDER — SODIUM CHLORIDE 0.9 % IV SOLN: 250.0000 mL | INTRAVENOUS | Status: DC | PRN

## 2023-02-07 MED ORDER — SODIUM CHLORIDE 0.9% FLUSH: 3.0000 mL | INTRAVENOUS | Status: DC | PRN

## 2023-02-07 MED ORDER — MEXILETINE HCL 200 MG PO CAPS
200.0000 mg | ORAL_CAPSULE | Freq: Two times a day (BID) | ORAL | Status: DC
  Administered 2023-02-07 – 2023-02-09 (×4): 200 mg via ORAL
  Filled 2023-02-07 (×5): qty 1

## 2023-02-07 MED ORDER — SODIUM CHLORIDE 0.9 % WEIGHT BASED INFUSION
3.0000 mL/kg/h | INTRAVENOUS | Status: DC
  Administered 2023-02-08: 3 mL/kg/h via INTRAVENOUS

## 2023-02-07 MED ORDER — POTASSIUM CHLORIDE CRYS ER 20 MEQ PO TBCR
40.0000 meq | EXTENDED_RELEASE_TABLET | ORAL | Status: AC
  Administered 2023-02-07 (×2): 40 meq via ORAL
  Filled 2023-02-07 (×2): qty 2

## 2023-02-07 MED ORDER — SODIUM CHLORIDE 0.9 % WEIGHT BASED INFUSION
1.0000 mL/kg/h | INTRAVENOUS | Status: DC
  Administered 2023-02-08: 1 mL/kg/h via INTRAVENOUS

## 2023-02-07 NOTE — Progress Notes (Signed)
[place] for heparin Indication: chest pain/ACS  Allergies  Allergen Reactions   Adhesive [Tape]     Swelling and welts formed    Patient Measurements: Height: '5\' 5"'$  (165.1 cm) Weight: 84.6 kg (186 lb 8.2 oz) IBW/kg (Calculated) : 61.5 HEPARIN DW (KG): 79.2   Vital Signs: Temp: 98.3 F (36.8 C) (03/12 0955) Temp Source: Oral (03/12 0955) BP: 129/56 (03/12 0955) Pulse Rate: 75 (03/12 0955)  Labs: Recent Labs    02/05/23 0005 02/06/23 0733 02/07/23 0204 02/07/23 1149  HGB 14.9  --  13.9  --   HCT 43.3  --  40.2  --   PLT 160  --  148*  --   HEPARINUNFRC  --   --   --  0.28*  CREATININE 1.29* 1.04 1.08  --      Estimated Creatinine Clearance: 60.9 mL/min (by C-G formula based on SCr of 1.08 mg/dL).   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   Past Week   amiodarone (PACERONE) 100 MG tablet Take 1 tablet (100 mg total) by mouth 2 (two) times daily. 180 tablet 3 02/04/2023   aspirin EC 81 MG tablet Take 1 tablet (81 mg total) by mouth daily.   02/04/2023   atorvastatin (LIPITOR) 10 MG tablet TAKE 1 TABLET BY MOUTH EVERY DAY 90 tablet 2 02/04/2023   Cholecalciferol (VITAMIN D) 2000 UNITS tablet  Take 2,000 Units by mouth daily.   02/04/2023   empagliflozin (JARDIANCE) 25 MG TABS tablet Take 1 tablet (25 mg total) by mouth daily before breakfast. 90 tablet 1 02/04/2023   famotidine (PEPCID) 20 MG tablet Take 1 tablet (20 mg total) by mouth 2 (two) times daily as needed. 60 tablet 4 02/04/2023   gabapentin (NEURONTIN) 100 MG capsule TAKE 1 CAPSULE BY MOUTH EVERYDAY AT BEDTIME 90 capsule 0 02/04/2023   metFORMIN (GLUCOPHAGE) 500 MG tablet Take 1 tablet (500 mg total) by mouth daily with breakfast. 90 tablet 3 02/04/2023   metoprolol succinate (TOPROL XL) 50 MG 24 hr tablet Take 1 tablet (50 mg total) by mouth daily. Take in addition to Toprol XL 100 mg.  100 mg in AM and 50 mg in PM PO. (Patient taking differently: Take 50 mg by mouth every evening. Take in addition to Toprol XL 100 mg.  100 mg in AM and 50 mg in PM PO.) 90 tablet 3 02/04/2023 at 21:30   metoprolol succinate (TOPROL-XL) 100 MG 24 hr tablet TAKE 1 TABLET BY MOUTH ONCE DAILY IMMEDIATELY  FOLLOWING  A  MEAL (Patient taking differently: Take 100 mg by mouth daily. IMMEDIATELY  FOLLOWING  A  MEAL) 90 tablet 3  02/04/2023   mexiletine (MEXITIL) 200 MG capsule Take 1 capsule (200 mg total) by mouth 2 (two) times daily. 180 capsule 0 02/04/2023   olmesartan-hydrochlorothiazide (BENICAR HCT) 20-12.5 MG tablet TAKE 1 TABLET BY MOUTH EVERY DAY 90 tablet 2 02/04/2023   Omega-3 Fatty Acids (FISH OIL) 1000 MG CAPS Take 1,000 mg by mouth 2 (two) times daily.   02/04/2023   tadalafil (CIALIS) 20 MG tablet Take 0.5-1 tablets (10-20 mg total) by mouth daily as needed for erectile dysfunction. 10 tablet 11 Past Month   Scheduled:   aspirin EC  81 mg Oral Daily   atorvastatin  80 mg Oral Daily   gabapentin  100 mg Oral QHS   insulin aspart  0-15 Units Subcutaneous TID WC   LORazepam  1 mg Oral Once   metoprolol succinate  100 mg Oral BID   mexiletine  200 mg Oral Q12H   potassium chloride  40 mEq Oral Q2H while awake   sodium chloride flush  3 mL Intravenous Q12H    sodium chloride flush  3 mL Intravenous Q12H   sodium chloride flush  3 mL Intravenous Q12H   ticagrelor  90 mg Oral BID    Assessment: 72 yo male here with VT storm, he is noted s/p cath with DES to LAD and plans for staged PCI to RCA.  -heparin level 0.28 on 1100 units/hr  Goal of Therapy:  Heparin level 0.3-0.7 units/ml Monitor platelets by anticoagulation protocol: Yes   Plan:  --Increase heparin to 1250 units/hr -Heparin level in 8 hours and daily wth CBC daily   Hildred Laser, PharmD Clinical Pharmacist **Pharmacist phone directory can now be found on [email or web address] ([email or web address]).  Listed under [place].  Hildred Laser, PharmD Clinical Pharmacist **Pharmacist phone directory can now be found on [email or web address] (PW [email or web address]).  Listed under [place].

## 2023-02-07 NOTE — Progress Notes (Signed)
  Patient Name: Dakota Gutierrez Date of Encounter: 02/07/2023  Primary Cardiologist: None Electrophysiologist: Dr. Caryl Comes  Interval Summary   The patient is doing well today.  At this time, the patient denies chest pain, shortness of breath, or any new concerns.  Inpatient Medications    Scheduled Meds:  aspirin  81 mg Oral Daily   aspirin EC  81 mg Oral Daily   atorvastatin  80 mg Oral Daily   gabapentin  100 mg Oral QHS   insulin aspart  0-15 Units Subcutaneous TID WC   LORazepam  1 mg Oral Once   metoprolol succinate  100 mg Oral BID   mexiletine  200 mg Oral Q12H   sodium chloride flush  3 mL Intravenous Q12H   sodium chloride flush  3 mL Intravenous Q12H   ticagrelor  90 mg Oral BID   Continuous Infusions:  sodium chloride     amiodarone 30 mg/hr (02/07/23 0545)   heparin 1,100 Units/hr (02/07/23 0307)   PRN Meds: sodium chloride, acetaminophen, lip balm, morphine injection, ondansetron (ZOFRAN) IV, ondansetron (ZOFRAN) IV, sodium chloride flush   Vital Signs    Vitals:   02/06/23 2310 02/07/23 0000 02/07/23 0335 02/07/23 0400  BP: (!) 185/90 (!) 169/79 102/62 120/61  Pulse: 77 75 62 63  Resp:  18 18   Temp:  98.6 F (37 C) 98 F (36.7 C)   TempSrc:  Oral Oral   SpO2: 99% 96% 96% 94%  Weight:      Height:        Intake/Output Summary (Last 24 hours) at 02/07/2023 0735 Last data filed at 02/07/2023 0344 Gross per 24 hour  Intake 514.24 ml  Output 800 ml  Net -285.76 ml   Filed Weights   02/04/23 2359  Weight: 84.6 kg    Physical Exam    GEN- The patient is well appearing, alert and oriented x 3 today.   Lungs- Clear to ausculation bilaterally, normal work of breathing Cardiac- Regular rate and rhythm, no murmurs, rubs or gallops GI- soft, NT, ND, + BS Extremities- no clubbing or cyanosis. No edema  Telemetry    NSR 60-70s (personally reviewed)  Hospital Course    Dakota Gutierrez is a 72 y.o. male admitted for incessant VT with  history of same and underwent ischemic work up.   Assessment & Plan    VT, incessant Quiscent now on amiodarone Transition off lidocaine and back on to mexitil 200 mg BID, titrate up as needed. Continue IV amiodarone through cath tomorrow.  Potassium3.4* (03/12 9476) Magnesium  2.1 (03/10 0005) Creatinine, ser  1.08 (03/12 0204) Keep K > 4.0 and Mg > 2.0   CAD S/p LAD stent 3/11 by Dr. Gwenlyn Found With large dye burden and VT with stent deployment, will undergo staged PCI to RCA 3/13. Continue heparin.  Cardiomyopathy Prevously non ischemic, now with severe CAD Echo 55-60 09/2022, pending update.  Will adjust GDMT as warranted pending echo.   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  May be reasonable to follow up with Dr. Michail Sermon on discharge, though re-assuring trigger/ischemia found and treated.   NPO after midnight for staged PCI tomorrow.   For questions or updates, please contact [place] Please consult [email or web address] for contact info under Cardiology/STEMI.  Signed, Shirley Friar, PA-C  02/07/2023, 7:35 AM

## 2023-02-07 NOTE — Progress Notes (Signed)
[place] for heparin Indication: chest pain/ACS  Allergies  Allergen Reactions   Adhesive [Tape]     Swelling and welts formed    Patient Measurements: Height: '5\' 5"'$  (165.1 cm) Weight: 84.6 kg (186 lb 8.2 oz) IBW/kg (Calculated) : 61.5 HEPARIN DW (KG): 79.2   Vital Signs: Temp: 97.8 F (36.6 C) (03/12 2020) Temp Source: Oral (03/12 2020) BP: 167/67 (03/12 2020) Pulse Rate: 78 (03/12 2020)  Labs: Recent Labs    02/05/23 0005 02/06/23 0733 02/07/23 0204 02/07/23 1149 02/07/23 2123  HGB 14.9  --  13.9  --   --   HCT 43.3  --  40.2  --   --   PLT 160  --  148*  --   --   HEPARINUNFRC  --   --   --  0.28* 0.32  CREATININE 1.29* 1.04 1.08  --   --      Estimated Creatinine Clearance: 60.9 mL/min (by C-G formula based on SCr of 1.08 mg/dL).   Assessment: 72 yo male here with VT storm, he is noted s/p cath with DES to LAD and plans for staged PCI to RCA.   Heparin level therapeutic (0.32) on heparin at 1250 units/hr. No bleeding noted.  Goal of Therapy:  Heparin level 0.3-0.7 units/ml Monitor platelets by anticoagulation protocol: Yes   Plan:  Continue heparin at 1250 units/hr Daily heparin level to confirm therapeutic  Sherlon Handing, PharmD, BCPS Please see amion for complete clinical pharmacist phone list 02/07/2023 9:56 PM

## 2023-02-08 ENCOUNTER — Encounter (HOSPITAL_COMMUNITY): Payer: Self-pay | Admitting: Cardiovascular Disease

## 2023-02-08 ENCOUNTER — Encounter (HOSPITAL_COMMUNITY)
Admission: EM | Disposition: A | Discharge status: Home / Self Care | Payer: Self-pay | Source: Home / Self Care | Attending: Internal Medicine

## 2023-02-08 DIAGNOSIS — I2510 Atherosclerotic heart disease of native coronary artery without angina pectoris: Secondary | ICD-10-CM

## 2023-02-08 HISTORY — PX: CORONARY STENT INTERVENTION: CATH118234

## 2023-02-08 LAB — GLUCOSE, CAPILLARY
Glucose-Capillary: 173 mg/dL — ABNORMAL HIGH (ref 70–99)
Glucose-Capillary: 162 mg/dL — ABNORMAL HIGH (ref 70–99)
Glucose-Capillary: 161 mg/dL — ABNORMAL HIGH (ref 70–99)
Glucose-Capillary: 180 mg/dL — ABNORMAL HIGH (ref 70–99)

## 2023-02-08 LAB — CBC
HCT: 38.8 % — ABNORMAL LOW (ref 39.0–52.0)
Hemoglobin: 13.5 g/dL (ref 13.0–17.0)
MCH: 30.2 pg (ref 26.0–34.0)
MCHC: 34.8 g/dL (ref 30.0–36.0)
MCV: 86.8 fL (ref 80.0–100.0)
Platelets: 149 10*3/uL — ABNORMAL LOW (ref 150–400)
RBC: 4.47 MIL/uL (ref 4.22–5.81)
RDW: 13.4 % (ref 11.5–15.5)
WBC: 7 10*3/uL (ref 4.0–10.5)
nRBC: 0 % (ref 0.0–0.2)

## 2023-02-08 LAB — POCT ACTIVATED CLOTTING TIME
Activated Clotting Time: 293 seconds
Activated Clotting Time: 250 seconds
Activated Clotting Time: 271 seconds

## 2023-02-08 LAB — BASIC METABOLIC PANEL
Anion gap: 9 (ref 5–15)
BUN: 18 mg/dL (ref 8–23)
CO2: 18 mmol/L — ABNORMAL LOW (ref 22–32)
Calcium: 8.6 mg/dL — ABNORMAL LOW (ref 8.9–10.3)
Chloride: 108 mmol/L (ref 98–111)
Creatinine, Ser: 1.1 mg/dL (ref 0.61–1.24)
GFR, Estimated: >60 mL/min (ref >60)
Glucose, Bld: 167 mg/dL — ABNORMAL HIGH (ref 70–99)
Potassium: 4.1 mmol/L (ref 3.5–5.1)
Sodium: 135 mmol/L (ref 135–145)

## 2023-02-08 LAB — HEPARIN LEVEL (UNFRACTIONATED): Heparin Unfractionated: 0.41 IU/mL (ref 0.30–0.70)

## 2023-02-08 LAB — LIPOPROTEIN A (LPA): Lipoprotein (a): 88.9 nmol/L — ABNORMAL HIGH (ref <75.0)

## 2023-02-08 LAB — MAGNESIUM: Magnesium: 2 mg/dL (ref 1.7–2.4)

## 2023-02-08 SURGERY — CORONARY STENT INTERVENTION
Anesthesia: LOCAL

## 2023-02-08 MED ORDER — AMIODARONE HCL 200 MG PO TABS
200.0000 mg | ORAL_TABLET | Freq: Two times a day (BID) | ORAL | Status: DC
  Administered 2023-02-08 – 2023-02-09 (×3): 200 mg via ORAL
  Filled 2023-02-08 (×3): qty 1

## 2023-02-08 MED ORDER — HEPARIN SODIUM (PORCINE) 1000 UNIT/ML IJ SOLN
INTRAMUSCULAR | Status: AC
  Filled 2023-02-08: qty 10

## 2023-02-08 MED ORDER — NITROGLYCERIN 1 MG/10 ML FOR IR/CATH LAB
INTRA_ARTERIAL | Status: DC | PRN
  Administered 2023-02-08: 100 ug via INTRACORONARY
  Administered 2023-02-08: 200 ug via INTRACORONARY
  Administered 2023-02-08: 150 ug via INTRACORONARY

## 2023-02-08 MED ORDER — FENTANYL CITRATE (PF) 100 MCG/2ML IJ SOLN
INTRAMUSCULAR | Status: DC | PRN
  Administered 2023-02-08 (×2): 25 ug via INTRAVENOUS

## 2023-02-08 MED ORDER — NITROGLYCERIN 1 MG/10 ML FOR IR/CATH LAB
INTRA_ARTERIAL | Status: AC
  Filled 2023-02-08: qty 10

## 2023-02-08 MED ORDER — MIDAZOLAM HCL 2 MG/2ML IJ SOLN
INTRAMUSCULAR | Status: AC
  Filled 2023-02-08: qty 2

## 2023-02-08 MED ORDER — LIDOCAINE HCL (PF) 1 % IJ SOLN
INTRAMUSCULAR | Status: DC | PRN
  Administered 2023-02-08: 2 mL

## 2023-02-08 MED ORDER — SODIUM CHLORIDE 0.9 % IV SOLN: 250.0000 mL | INTRAVENOUS | Status: DC | PRN

## 2023-02-08 MED ORDER — MIDAZOLAM HCL 2 MG/2ML IJ SOLN
INTRAMUSCULAR | Status: DC | PRN
  Administered 2023-02-08: 2 mg via INTRAVENOUS
  Administered 2023-02-08: 1 mg via INTRAVENOUS

## 2023-02-08 MED ORDER — SODIUM CHLORIDE 0.9 % IV SOLN: INTRAVENOUS | Status: AC

## 2023-02-08 MED ORDER — VERAPAMIL HCL 2.5 MG/ML IV SOLN
INTRAVENOUS | Status: AC
  Filled 2023-02-08: qty 2

## 2023-02-08 MED ORDER — LABETALOL HCL 5 MG/ML IV SOLN
10.0000 mg | INTRAVENOUS | Status: AC | PRN
  Administered 2023-02-08: 10 mg via INTRAVENOUS
  Filled 2023-02-08: qty 4

## 2023-02-08 MED ORDER — FENTANYL CITRATE (PF) 100 MCG/2ML IJ SOLN
INTRAMUSCULAR | Status: AC
  Filled 2023-02-08: qty 2

## 2023-02-08 MED ORDER — HEPARIN (PORCINE) IN NACL 1000-0.9 UT/500ML-% IV SOLN
INTRAVENOUS | Status: DC | PRN
  Administered 2023-02-08 (×2): 500 mL

## 2023-02-08 MED ORDER — LIDOCAINE HCL (PF) 1 % IJ SOLN
INTRAMUSCULAR | Status: AC
  Filled 2023-02-08: qty 30

## 2023-02-08 MED ORDER — VERAPAMIL HCL 2.5 MG/ML IV SOLN
INTRAVENOUS | Status: DC | PRN
  Administered 2023-02-08: 10 mL via INTRA_ARTERIAL

## 2023-02-08 MED ORDER — IOHEXOL 350 MG/ML SOLN
INTRAVENOUS | Status: DC | PRN
  Administered 2023-02-08: 60 mL

## 2023-02-08 MED ORDER — SODIUM CHLORIDE 0.9% FLUSH: 3.0000 mL | INTRAVENOUS | Status: DC | PRN

## 2023-02-08 MED ORDER — SODIUM CHLORIDE 0.9% FLUSH
3.0000 mL | Freq: Two times a day (BID) | INTRAVENOUS | Status: DC
  Administered 2023-02-08 (×2): 3 mL via INTRAVENOUS

## 2023-02-08 MED ORDER — HYDRALAZINE HCL 20 MG/ML IJ SOLN
10.0000 mg | INTRAMUSCULAR | Status: AC | PRN
  Filled 2023-02-08: qty 1

## 2023-02-08 MED ORDER — HEPARIN SODIUM (PORCINE) 1000 UNIT/ML IJ SOLN
INTRAMUSCULAR | Status: DC | PRN
  Administered 2023-02-08: 9000 [IU] via INTRAVENOUS
  Administered 2023-02-08: 3000 [IU] via INTRAVENOUS

## 2023-02-08 SURGICAL SUPPLY — 25 items
BALL SAPPHIRE NC24 2.50X12 (BALLOONS) ×1
BALL SAPPHIRE NC24 3.25X8 (BALLOONS) ×1
BALLN EMERGE MR 2.5X12 (BALLOONS) ×1
BALLOON EMERGE MR 2.5X12 (BALLOONS) IMPLANT
BALLOON SAPPHIRE NC24 2.50X12 (BALLOONS) IMPLANT
BALLOON SAPPHIRE NC24 3.25X8 (BALLOONS) IMPLANT
BALLOON TAKERU 2.0X12 (BALLOONS) IMPLANT
CATH TELESCOPE 6F GEC (CATHETERS) IMPLANT
CATH VISTA GUIDE 6FR JR4 (CATHETERS) IMPLANT
DEVICE RAD COMP TR BAND LRG (VASCULAR PRODUCTS) IMPLANT
ELECT DEFIB PAD ADLT CADENCE (PAD) IMPLANT
GLIDESHEATH SLEND SS 6F .021 (SHEATH) IMPLANT
GUIDEWIRE INQWIRE 1.5J.035X260 (WIRE) IMPLANT
INQWIRE 1.5J .035X260CM (WIRE) ×1
KIT ENCORE 26 ADVANTAGE (KITS) IMPLANT
KIT HEART LEFT (KITS) ×1 IMPLANT
PACK CARDIAC CATHETERIZATION (CUSTOM PROCEDURE TRAY) ×1 IMPLANT
SHEATH PROBE COVER 6X72 (BAG) IMPLANT
STENT SYNERGY XD 3.0X16 (Permanent Stent) IMPLANT
SYNERGY XD 3.0X16 (Permanent Stent) ×1 IMPLANT
TRANSDUCER W/STOPCOCK (MISCELLANEOUS) ×1 IMPLANT
TUBING CIL FLEX 10 FLL-RA (TUBING) ×1 IMPLANT
VALVE GUARDIAN II ~~LOC~~ HEMO (MISCELLANEOUS) IMPLANT
WIRE COUGAR XT STRL 190CM (WIRE) IMPLANT
WIRE RUNTHROUGH IZANAI 014 180 (WIRE) IMPLANT

## 2023-02-08 NOTE — Progress Notes (Signed)
  Patient Name: Dakota Gutierrez Date of Encounter: 02/08/2023  Primary Cardiologist: None Electrophysiologist: Virl Axe, MD  Interval Summary   No events overnight. Ready for cath this am  The patient is doing well today.  At this time, the patient denies chest pain, shortness of breath, or any new concerns.  Inpatient Medications    Scheduled Meds:  aspirin EC  81 mg Oral Daily   atorvastatin  80 mg Oral Daily   gabapentin  100 mg Oral QHS   insulin aspart  0-15 Units Subcutaneous TID WC   LORazepam  1 mg Oral Once   metoprolol succinate  100 mg Oral BID   mexiletine  200 mg Oral Q12H   sodium chloride flush  3 mL Intravenous Q12H   ticagrelor  90 mg Oral BID   Continuous Infusions:  sodium chloride     sodium chloride     sodium chloride 1 mL/kg/hr (02/08/23 0600)   amiodarone 30 mg/hr (02/08/23 0600)   heparin 1,250 Units/hr (02/08/23 0600)   PRN Meds: sodium chloride, sodium chloride, acetaminophen, ALPRAZolam, lip balm, morphine injection, ondansetron (ZOFRAN) IV, ondansetron (ZOFRAN) IV, sodium chloride flush   Vital Signs    Vitals:   02/07/23 1617 02/07/23 2020 02/07/23 2242 02/08/23 0345  BP: 125/62 (!) 167/67 (!) 157/77 (!) 164/68  Pulse: 78 78 75 62  Resp: 17 19  17   Temp: 98.3 F (36.8 C) 97.8 F (36.6 C)  97.8 F (36.6 C)  TempSrc: Oral Oral  Oral  SpO2: 98% 98%  98%  Weight:      Height:        Intake/Output Summary (Last 24 hours) at 02/08/2023 0714 Last data filed at 02/08/2023 0600 Gross per 24 hour  Intake 1331.39 ml  Output --  Net 1331.39 ml   Filed Weights   02/04/23 2359  Weight: 84.6 kg    Physical Exam    GEN- The patient is well appearing, alert and oriented x 3 today.   Lungs- Clear to ausculation bilaterally, normal work of breathing Cardiac- Regular rate and rhythm, no murmurs, rubs or gallops GI- soft, NT, ND, + BS Extremities- no clubbing or cyanosis. No edema  Telemetry    A paced 60-70s, no further VT  (personally reviewed)  Hospital Course    Dakota Gutierrez is a 72 y.o. male with h/o VT and previously NICM admitted for for incessant VT with history of same and underwent ischemic work up.    No notes on file  Assessment & Plan    VT, incessant Remains quiescent Continue mexitil 200 mg BID, titrate up as needed. Continue IV amiodarone through cath today Potassium4.1 (03/13 0152) Mg pending Creatinine, ser  1.10 (03/13 0152) Keep K > 4.0 and Mg > 2.0     CAD S/p LAD stent 3/11 by Dr. Gwenlyn Found With large dye burden and VT with stent deployment, will undergo staged PCI to RCA 3/13. Continue heparin. Cr OK today.    Cardiomyopathy Prevously non ischemic, now with severe CAD Echo 3/12 with EF 55-60, Grade 1 DD, stable. Will adjust GDMT as warranted pending echo.    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  Have offered follow up with Dr. Michail Sermon on discharge; re-assuring trigger/ischemia found and treated.   For questions or updates, please contact [place] Please consult [email or web address] for contact info under Cardiology/STEMI.  Signed, Shirley Friar, PA-C  02/08/2023, 7:14 AM

## 2023-02-08 NOTE — Hospital Course (Signed)
Admitted 3/9 with multiple ICD shock, VT storm Started IV amio / Lidocaine LHC 3/11 with 3v CAD, LAD lesion stented Had VT with stent deployment and large contrast load Transitioned to mexitil 3/12 PCI 3/13 with DES to RCA -> to po amio.

## 2023-02-08 NOTE — Care Management Important Message (Signed)
Important Message  Patient Details  Name: Dakota Gutierrez MRN: [id number] Date of Birth: 03-Nov-1951   Medicare Important Message Given:  Yes     Shelda Altes 02/08/2023, 8:43 AM

## 2023-02-08 NOTE — Interval H&P Note (Signed)
History and Physical Interval Note:  02/08/2023 8:56 AM  Dakota Gutierrez  has presented today for surgery, with the diagnosis of cad.  The various methods of treatment have been discussed with the patient and family. After consideration of risks, benefits and other options for treatment, the patient has consented to  Procedure(s): CORONARY STENT INTERVENTION (N/A) as a surgical intervention.  The patient's history has been reviewed, patient examined, no change in status, stable for surgery.  I have reviewed the patient's chart and labs.  Questions were answered to the patient's satisfaction.    Pt returns for staged PCI of critical stenosis in the RCA. Films reviewed. Spoke with patient and wife in room this am prior to coming to cath lab. Received ticagrelor dose this am already. Risks/indications of PCI reviewed with them - informed consent obtained. They understand this is a bifurcation lesion and may require side-branch PTCA.  Dakota Gutierrez

## 2023-02-09 ENCOUNTER — Other Ambulatory Visit (HOSPITAL_COMMUNITY): Payer: Self-pay

## 2023-02-09 LAB — BASIC METABOLIC PANEL
Anion gap: 8 (ref 5–15)
BUN: 15 mg/dL (ref 8–23)
CO2: 19 mmol/L — ABNORMAL LOW (ref 22–32)
Calcium: 8.6 mg/dL — ABNORMAL LOW (ref 8.9–10.3)
Chloride: 108 mmol/L (ref 98–111)
Creatinine, Ser: 1.09 mg/dL (ref 0.61–1.24)
GFR, Estimated: >60 mL/min (ref >60)
Glucose, Bld: 152 mg/dL — ABNORMAL HIGH (ref 70–99)
Potassium: 4.1 mmol/L (ref 3.5–5.1)
Sodium: 135 mmol/L (ref 135–145)

## 2023-02-09 LAB — CBC
HCT: 39.1 % (ref 39.0–52.0)
Hemoglobin: 13.6 g/dL (ref 13.0–17.0)
MCH: 30.3 pg (ref 26.0–34.0)
MCHC: 34.8 g/dL (ref 30.0–36.0)
MCV: 87.1 fL (ref 80.0–100.0)
Platelets: 155 10*3/uL (ref 150–400)
RBC: 4.49 MIL/uL (ref 4.22–5.81)
RDW: 13.6 % (ref 11.5–15.5)
WBC: 7.5 10*3/uL (ref 4.0–10.5)
nRBC: 0 % (ref 0.0–0.2)

## 2023-02-09 LAB — MAGNESIUM: Magnesium: 2.1 mg/dL (ref 1.7–2.4)

## 2023-02-09 LAB — GLUCOSE, CAPILLARY: Glucose-Capillary: 173 mg/dL — ABNORMAL HIGH (ref 70–99)

## 2023-02-09 MED ORDER — TICAGRELOR 90 MG PO TABS
90.0000 mg | ORAL_TABLET | Freq: Two times a day (BID) | ORAL | 6 refills | Status: DC
  Filled 2023-02-09 – 2023-03-08 (×2): qty 60, 30d supply, fill #0
  Filled 2023-04-03: qty 60, 30d supply, fill #1
  Filled 2023-05-03: qty 60, 30d supply, fill #2

## 2023-02-09 MED ORDER — AMIODARONE HCL 200 MG PO TABS
200.0000 mg | ORAL_TABLET | Freq: Two times a day (BID) | ORAL | 6 refills | Status: DC
  Filled 2023-02-09: qty 60, 30d supply, fill #0

## 2023-02-09 MED ORDER — METFORMIN HCL 500 MG PO TABS
500.0000 mg | ORAL_TABLET | Freq: Every day | ORAL | 3 refills | Status: DC
  Filled 2023-02-09: qty 90, 90d supply, fill #0

## 2023-02-09 MED ORDER — ALPRAZOLAM 0.25 MG PO TABS
0.2500 mg | ORAL_TABLET | Freq: Two times a day (BID) | ORAL | 1 refills | Status: AC | PRN
  Filled 2023-02-09 – 2023-03-08 (×2): qty 30, 15d supply, fill #0

## 2023-02-09 MED ORDER — METOPROLOL SUCCINATE ER 100 MG PO TB24
100.0000 mg | ORAL_TABLET | Freq: Every day | ORAL | 6 refills | Status: DC
  Filled 2023-02-09: qty 60, 60d supply, fill #0

## 2023-02-09 NOTE — Discharge Instructions (Addendum)
PLEASE REMEMBER TO BRING ALL OF YOUR MEDICATIONS TO EACH OF YOUR FOLLOW-UP OFFICE VISITS.  PLEASE ATTEND ALL SCHEDULED FOLLOW-UP APPOINTMENTS.   Activity: Increase activity slowly as tolerated. You may shower, but no soaking baths (or swimming) for 1 week. No driving until told you are safe to do so by Dr. Olin Pia team. No lifting over 5 lbs for 1 week. No sexual activity for 1 week.   You May Return to Work: in 1 week (if applicable)  Wound Care: You may wash cath site gently with soap and water. Keep cath site clean and dry. If you notice pain, swelling, bleeding or pus at your cath site, please call [phone].

## 2023-02-09 NOTE — Progress Notes (Signed)
CARDIAC REHAB PHASE I   PRE:  Rate/Rhythm: 79 SR, PVCs  BP:  Sitting: 117/59  MODE:  Ambulation: 470 ft   POST:  Rate/Rhythm: 79 SR PVCs  BP:  Sitting: 146/77   Pt received EOB, agreeable to amb and education. Wife present in room. Pt amb in hallway independently with standby assist, tolerated well with no s/sx. Returned to EOB, educated on risk factors, exercise guidelines, angina, stent, site care, restrictions, nutrition, ASA and Brilinta, and orientation to [place] program. Referral to be [facility] to [facility]. All questions from pt and wife answered, pt left on EOB with call bell in reach.   Of note, Pt is highly interested in CRP2, concerned he will not be able to go due to driving restrictions and was asking about when those would be lifted.  [place], MS 02/09/2023 9:49 AM

## 2023-02-09 NOTE — Discharge Summary (Addendum)
ELECTROPHYSIOLOGY DISCHARGE SUMMARY    Patient ID: Dakota Gutierrez,  MRN: [id number], DOB/AGE: 05-19-1951 72 y.o.  Admit date: 02/04/2023 Discharge date: 02/09/2023  Primary Care Physician: Wendie Agreste, MD  Primary Cardiologist: None  Electrophysiologist: Dr. Caryl Comes   Primary Discharge Diagnosis:  Ventricular tachycardia CAD  Secondary Discharge Diagnosis:  Ischemic cardiomyopathy Anxiety post multiple ICD shocks  Procedures This Admission:  LHC/PCI 02/06/2023   Prox LAD to Mid LAD lesion is 75% stenosed.   Mid RCA lesion is 99% stenosed.   Acute Mrg lesion is 95% stenosed.   RPAV lesion is 100% stenosed.   A drug-eluting stent was successfully placed using a STENT ONYX FRONTIER 2.5X30.   Post intervention, there is a 0% residual stenosis.  Echo 02/07/2023 LVEF 55-60%, Grade 1 DD. Normal valves  PCI 02/08/2023 Successful PCI of severe stenosis in the mid RCA using a 3.0 x 16 mm Synergy DES, PCI requiring use of a guide extension catheter and serial lesion dilatation as outlined. Recommend DAPT with aspirin and ticagrelor x 12 months without interruption.     Brief HPI: Dakota VERDELL is a 72 y.o. male with a history of NSVT, PMVT, previously NICM, and SND admitted for VT storm.   Hospital Course:  The patient was admitted with VT storm and numerous ICD shocks.   He had been seen by Dr. Caryl Comes in the office the week prior with increased VT and amiodarone was adjusted, as well as set up for coronary CTA.  Given recurrent shocks he was started on IV amiodarone and lidocaine that were transition to po as tolerated.  He underwent LHC with DES to LAD 3/11 but had large contrast load and VT during stent deployment. He was hydrated 3/12 and returned to lab 3/13 for staged PCI to his RCA which was successful. They were monitored on telemetry overnight which demonstrated no further VT on po medications.   The patient was examined and considered to be stable for discharge.   Wound care and restrictions were reviewed with the patient.  The patient will be seen back by EP APP in 1 week for post hospital care.   Physical Exam: Vitals:   02/08/23 1800 02/08/23 2020 02/09/23 0627 02/09/23 0737  BP:  128/67 131/63 (!) 117/59  Pulse: 75 75 67 60  Resp:  '18 18 18  '$ Temp:  99.5 F (37.5 C) 97.8 F (36.6 C) 98 F (36.7 C)  TempSrc:  Oral Oral Oral  SpO2: 97% 96% 98% 96%  Weight:      Height:        GEN- NAD. A&O x 3.  HEENT: Normocephalic, atraumatic Lungs- CTAB, Normal effort.  Heart- RRR, No M/G/R.  GI- Soft, NT, ND.  Extremities- No clubbing, cyanosis, or edema; Groin without hematoma   Discharge Medications:  Allergies as of 02/09/2023       Reactions   Adhesive [tape]    Swelling and welts formed        Medication List     TAKE these medications    acetaminophen 500 MG tablet Commonly known as: TYLENOL Take 1,000 mg by mouth daily as needed for moderate pain or headache.   ALPRAZolam 0.25 MG tablet Commonly known as: XANAX Take 1 tablet (0.25 mg total) by mouth 2 (two) times daily as needed for anxiety.   amiodarone 200 MG tablet Commonly known as: PACERONE Take 1 tablet (200 mg total) by mouth 2 (two) times daily. What changed:  medication strength how much  to take   aspirin EC 81 MG tablet Take 1 tablet (81 mg total) by mouth daily.   atorvastatin 10 MG tablet Commonly known as: LIPITOR TAKE 1 TABLET BY MOUTH EVERY DAY   empagliflozin 25 MG Tabs tablet Commonly known as: Jardiance Take 1 tablet (25 mg total) by mouth daily before breakfast.   famotidine 20 MG tablet Commonly known as: PEPCID Take 1 tablet (20 mg total) by mouth 2 (two) times daily as needed.   Fish Oil 1000 MG Caps Take 1,000 mg by mouth 2 (two) times daily.   gabapentin 100 MG capsule Commonly known as: NEURONTIN TAKE 1 CAPSULE BY MOUTH EVERYDAY AT BEDTIME   metFORMIN 500 MG tablet Commonly known as: GLUCOPHAGE Take 1 tablet (500 mg total) by  mouth daily with breakfast. Start taking on: February 12, 2023 What changed: These instructions start on February 12, 2023. If you are unsure what to do until then, ask your doctor or other care provider.   metoprolol succinate 100 MG 24 hr tablet Commonly known as: TOPROL-XL Take 1 tablet (100 mg total) by mouth daily. Take with or immediately following a meal. What changed:  See the new instructions. Another medication with the same name was removed. Continue taking this medication, and follow the directions you see here.   mexiletine 200 MG capsule Commonly known as: MEXITIL Take 1 capsule (200 mg total) by mouth 2 (two) times daily.   olmesartan-hydrochlorothiazide 20-12.5 MG tablet Commonly known as: BENICAR HCT TAKE 1 TABLET BY MOUTH EVERY DAY   tadalafil 20 MG tablet Commonly known as: CIALIS Take 0.5-1 tablets (10-20 mg total) by mouth daily as needed for erectile dysfunction.   ticagrelor 90 MG Tabs tablet Commonly known as: BRILINTA Take 1 tablet (90 mg total) by mouth 2 (two) times daily.   Vitamin D 50 MCG (2000 UT) tablet Take 2,000 Units by mouth daily.        Disposition:  Discharge Instructions     AMB Referral to Cardiac Rehabilitation - Phase II   Complete by: As directed    VT   Diagnosis: Other   After initial evaluation and assessments completed: Virtual Based Care may be provided alone or in conjunction with Phase 2 Cardiac Rehab based on patient barriers.: Yes   Intensive Cardiac Rehabilitation (ICR) [place] location only OR Traditional Cardiac Rehabilitation (TCR) *If criteria for ICR are not met will enroll in TCR [facility] only): Yes   AMB Referral to Cardiac Rehabilitation - Phase II   Complete by: As directed    Diagnosis: Coronary Stents   After initial evaluation and assessments completed: Virtual Based Care may be provided alone or in conjunction with Phase 2 Cardiac Rehab based on patient barriers.: Yes   Intensive Cardiac Rehabilitation (ICR) [place]  location only OR Traditional Cardiac Rehabilitation (TCR) *If criteria for ICR are not met will enroll in TCR [facility] only): Yes       Follow-up Information     Shirley Friar, PA-C Follow up.   Specialty: Cardiology Why: on 3/22 at 1120 for post hospital follow up Contact information: [address] [place] [address] [phone]                 Duration of Discharge Encounter: Greater than 30 minutes including physician time.  Jacalyn Lefevre, PA-C  02/09/2023 8:52 AM   (As above)

## 2023-02-10 ENCOUNTER — Telehealth: Payer: Self-pay

## 2023-02-10 NOTE — Transitions of Care (Post Inpatient/ED Visit) (Signed)
   02/10/2023  Name: Dakota Gutierrez MRN: [id number] DOB: Sep 30, 1951  Today's TOC FU Call Status: Today's TOC FU Call Status:: Successful TOC FU Call Competed TOC FU Call Complete Date: 02/10/23  Transition Care Management Follow-up Telephone Call Date of Discharge: 02/09/23 Discharge Facility: [facility]) Type of Discharge: Inpatient Admission Primary Inpatient Discharge Diagnosis:: "ventricular tachycardia s/p drug eluting coronary stent placement" How have you been since you were released from the hospital?: Better (Patient states he is doing "very good' since returning home. He has had no issues or concerns since returning home. Denies any pain/discomfort.) Any questions or concerns?: No  Items Reviewed: Did you receive and understand the discharge instructions provided?: Yes Medications obtained and verified?: Yes (Medications Reviewed) Any new allergies since your discharge?: No Dietary orders reviewed?: Yes Type of Diet Ordered:: low salt/heart healthy/carb modified Do you have support at home?: Yes People in Home: spouse Name of Support/Comfort Primary Source: [facility] and Equipment/Supplies: [place] Ordered?: NA Any new equipment or medical supplies ordered?: NA  Functional Questionnaire: Do you need assistance with bathing/showering or dressing?: No Do you need assistance with meal preparation?: No Do you need assistance with eating?: No Do you have difficulty maintaining continence: No Do you need assistance with getting out of bed/getting out of a chair/moving?: No Do you have difficulty managing or taking your medications?: No  Folllow up appointments reviewed: PCP Follow-up appointment confirmed?: Yes Date of PCP follow-up appointment?: 02/16/23 Follow-up Provider: Dr. Carlota Raspberry Specialist [facility] Follow-up appointment confirmed?: Yes Date of Specialist follow-up appointment?: 02/17/23 Follow-Up Specialty Provider:: Glennie Hawk Do you need transportation to your follow-up appointment?: No (pt states he is working on arranging getting someone to drive him to appt. Encouraged him to check into [employer] to see if he has transportation benefit. He is aware to call back if he needs assistance getting to appts.) Do you understand care options if your condition(s) worsen?: Yes-patient verbalized understanding  SDOH Interventions Today    Flowsheet Row Most Recent Value  SDOH Interventions   Food Insecurity Interventions Intervention Not Indicated  Transportation Interventions Intervention Not Indicated      TOC Interventions Today    Flowsheet Row Most Recent Value  TOC Interventions   TOC Interventions Discussed/Reviewed TOC Interventions Discussed, Post discharge activity limitations per provider      Interventions Today    Flowsheet Row Most Recent Value  Education Interventions   Education Provided Provided Education  Provided Verbal Education On Nutrition, When to see the doctor, Medication, Other  [cardiac mgmt]  Nutrition Interventions   Nutrition Discussed/Reviewed Nutrition Discussed, Decreasing salt, Decreasing sugar intake  Pharmacy Interventions   Pharmacy Dicussed/Reviewed Pharmacy Topics Discussed, Medications and their functions       Hetty Blend [facility] Care Management Care Management Community Coordinator Direct Phone: [phone] Toll Free: [phone] Fax: [phone]

## 2023-02-14 ENCOUNTER — Telehealth: Payer: Self-pay | Admitting: Family Medicine

## 2023-02-14 NOTE — Telephone Encounter (Signed)
FYI: Patient called to cancelled if his appt for this Thursday.Patient states that he just got of the hospital. I tried to schedule his HFU, patient refuse to schedule his HFU. Patient states that he will back later to schedule his HFU. Patient states that he was released on 02/09/23.

## 2023-02-16 ENCOUNTER — Ambulatory Visit: Payer: Medicare PPO | Admitting: Family Medicine

## 2023-02-17 ENCOUNTER — Encounter: Payer: Self-pay | Admitting: Student

## 2023-02-17 ENCOUNTER — Telehealth: Payer: Self-pay

## 2023-02-17 ENCOUNTER — Ambulatory Visit: Payer: Medicare PPO | Attending: Student | Admitting: Student

## 2023-02-17 ENCOUNTER — Other Ambulatory Visit: Payer: Self-pay

## 2023-02-17 ENCOUNTER — Ambulatory Visit: Payer: Medicare PPO | Attending: Cardiovascular Disease

## 2023-02-17 VITALS — BP 118/62 | HR 77 | Ht 65.0 in | Wt 172.0 lb

## 2023-02-17 DIAGNOSIS — Z95810 Presence of automatic (implantable) cardiac defibrillator: Secondary | ICD-10-CM | POA: Diagnosis not present

## 2023-02-17 DIAGNOSIS — I472 Ventricular tachycardia, unspecified: Secondary | ICD-10-CM

## 2023-02-17 DIAGNOSIS — I428 Other cardiomyopathies: Secondary | ICD-10-CM

## 2023-02-17 LAB — CUP PACEART INCLINIC DEVICE CHECK
Battery Remaining Longevity: 16 mo
Battery Remaining Longevity: 16 mo
Battery Voltage: 2.92 V
Battery Voltage: 2.91 V
Brady Statistic AP VP Percent: 29.6 %
Brady Statistic AP VP Percent: 30 %
Brady Statistic AP VS Percent: 61.91 %
Brady Statistic AP VS Percent: 61.67 %
Brady Statistic AS VP Percent: 0.01 %
Brady Statistic AS VP Percent: 0.01 %
Brady Statistic AS VS Percent: 8.48 %
Brady Statistic AS VS Percent: 8.33 %
Brady Statistic RA Percent Paced: 90.11 %
Brady Statistic RA Percent Paced: 90.29 %
Brady Statistic RV Percent Paced: 28.47 %
Brady Statistic RV Percent Paced: 28.89 %
Date Time Interrogation Session: 20240322115513
Date Time Interrogation Session: 20240322171618
HighPow Impedance: 41 Ohm
HighPow Impedance: 52 Ohm
HighPow Impedance: 40 Ohm
HighPow Impedance: 49 Ohm
Implantable Lead Connection Status: 753985
Implantable Lead Connection Status: 753985
Implantable Lead Connection Status: 753985
Implantable Lead Connection Status: 753985
Implantable Lead Implant Date: 20120531
Implantable Lead Implant Date: 20120531
Implantable Lead Implant Date: 20120531
Implantable Lead Implant Date: 20120531
Implantable Lead Location: 753859
Implantable Lead Location: 753860
Implantable Lead Location: 753859
Implantable Lead Location: 753860
Implantable Lead Model: 4076
Implantable Lead Model: 6947
Implantable Lead Model: 4076
Implantable Lead Model: 6947
Implantable Pulse Generator Implant Date: 20180530
Implantable Pulse Generator Implant Date: 20180530
Lead Channel Impedance Value: 399 Ohm
Lead Channel Impedance Value: 456 Ohm
Lead Channel Impedance Value: 456 Ohm
Lead Channel Impedance Value: 342 Ohm
Lead Channel Impedance Value: 418 Ohm
Lead Channel Impedance Value: 456 Ohm
Lead Channel Pacing Threshold Amplitude: 0.75 V
Lead Channel Pacing Threshold Amplitude: 0.75 V
Lead Channel Pacing Threshold Amplitude: 0.75 V
Lead Channel Pacing Threshold Amplitude: 0.75 V
Lead Channel Pacing Threshold Pulse Width: 0.4 ms
Lead Channel Pacing Threshold Pulse Width: 0.4 ms
Lead Channel Pacing Threshold Pulse Width: 0.4 ms
Lead Channel Pacing Threshold Pulse Width: 0.4 ms
Lead Channel Sensing Intrinsic Amplitude: 0.75 mV
Lead Channel Sensing Intrinsic Amplitude: 1.625 mV
Lead Channel Sensing Intrinsic Amplitude: 10.625 mV
Lead Channel Sensing Intrinsic Amplitude: 16.625 mV
Lead Channel Sensing Intrinsic Amplitude: 0.75 mV
Lead Channel Sensing Intrinsic Amplitude: 1.625 mV
Lead Channel Sensing Intrinsic Amplitude: 10.625 mV
Lead Channel Sensing Intrinsic Amplitude: 16.625 mV
Lead Channel Setting Pacing Amplitude: 1.5 V
Lead Channel Setting Pacing Amplitude: 2 V
Lead Channel Setting Pacing Amplitude: 1.5 V
Lead Channel Setting Pacing Amplitude: 2 V
Lead Channel Setting Pacing Pulse Width: 0.4 ms
Lead Channel Setting Pacing Pulse Width: 0.4 ms
Lead Channel Setting Sensing Sensitivity: 0.3 mV
Lead Channel Setting Sensing Sensitivity: 0.3 mV
Zone Setting Status: 755011
Zone Setting Status: 755011
Zone Setting Status: 755011
Zone Setting Status: 755011

## 2023-02-17 MED ORDER — AMIODARONE HCL 200 MG PO TABS: ORAL_TABLET | ORAL | 3 refills | Status: DC

## 2023-02-17 MED ORDER — AMIODARONE HCL 200 MG PO TABS: 200.0000 mg | ORAL_TABLET | Freq: Two times a day (BID) | ORAL | 3 refills | Status: DC

## 2023-02-17 MED ORDER — METOPROLOL SUCCINATE ER 100 MG PO TB24: ORAL_TABLET | ORAL | 3 refills | Status: DC

## 2023-02-17 NOTE — Progress Notes (Signed)
Electrophysiology Office Note:   Date:  02/17/2023  ID:  Dakota Gutierrez, DOB 08-06-1951, MRN [id number]  Primary Cardiologist: None Electrophysiologist: Virl Axe, MD   History of Present Illness:   Dakota Gutierrez is a 72 y.o. male with h/o NSVT, easily inducible PMVT, previous LFT elevation on amio, SND,  seen today for post hospital follow up.    Admitted 3/9 - 3/14 for VT storm and multiple ICD shocks. Quieted on amiodarone and lidocaine -> to po and mexitil. Underwent staged PCI to LAD -> RCA on separate days due to high contrast burden and VT when LAD stent was deployed. EF 55-60% with GRade 1 DD.   Since discharge from hospital the patient reports doing OK. He has had good days and bad days. 3/16 and 3/18 he had episodes of dizziness that were associated with NSVT and ATP. He had similar episodes last night which were NOT associated with VT or therapies.  3/20 was the "best day" he's had in awhile.  He is very anxious about getting shocked again. States he has occasional "waves of coldness", but no persistent chills.  Had an episode while we were talking, HR was normal at the time. .  he denies chest pain, palpitations, dyspnea, PND, orthopnea, nausea, vomiting, dizziness, syncope, edema, weight gain, or early satiety.   Review of systems complete and found to be negative unless listed in HPI.   Device History: MDT dual chamber ICD, implanted 04/28/11,  gen change may 2018, Dr. Caryl Comes, primary prevention +++ hx of appropriate therapy  AAD:  Amiodarone 2012, Hx of abn LFTs on amiodarone, was stopped and resumed 2/2 recurrent VT >> remains current Mexitil 01/2023 >>  Studies Reviewed:    ICD Interrogation-  reviewed in detail today,  See PACEART report.  EKG is ordered today. Personal review shows AP-VS at 77 bpm  LHC/PCI 02/06/2023   Prox LAD to Mid LAD lesion is 75% stenosed.   Mid RCA lesion is 99% stenosed.   Acute Mrg lesion is 95% stenosed.   RPAV lesion is 100%  stenosed.   A drug-eluting stent was successfully placed using a STENT ONYX FRONTIER 2.5X30.   Post intervention, there is a 0% residual stenosis.   Echo 02/07/2023 LVEF 55-60%, Grade 1 DD. Normal valves   PCI 02/08/2023 Successful PCI of severe stenosis in the mid RCA using a 3.0 x 16 mm Synergy DES, PCI requiring use of a guide extension catheter and serial lesion dilatation as outlined. Recommend DAPT with aspirin and ticagrelor x 12 months without interruption.   Risk Assessment/Calculations:              Physical Exam:   VS:  BP 118/62   Pulse 77   Ht 5\' 5"  (1.651 m)   Wt 172 lb (78 kg)   SpO2 99%   BMI 28.62 kg/m    Wt Readings from Last 3 Encounters:  02/17/23 172 lb (78 kg)  02/04/23 186 lb 8.2 oz (84.6 kg)  01/30/23 186 lb 6.4 oz (84.6 kg)     GEN: Well nourished, well developed in no acute distress NECK: No JVD; No carotid bruits CARDIAC: Regular rate and rhythm, no murmurs, rubs, gallops RESPIRATORY:  Clear to auscultation without rales, wheezing or rhonchi  ABDOMEN: Soft, non-tender, non-distended EXTREMITIES:  No edema; No deformity   ASSESSMENT AND PLAN:    HFrecEF s/p Medtronic dual chamber ICD  euvolemic today Stable on an appropriate medical regimen Normal ICD function See Pace Art report No  changes today  VT, incessant He has had frequent NSVT since leaving the hospital, but only 4 episodes have fallen into a quite aggressive therapy scheme of >133 bpm. He had ATP 1 on 3/16 and 3 on 3/18. He was aware of these with lightheadedness and "grabbing" sensation in his chest. Continue mexitil 200 mg BID. Consider up-titration, but with occasional dizziness NOT associated with VT by device, will defer.  Continue amiodarone 200 mg BID.  Labs today.   Increase toprol 100 mg q am and 50 mg q pm.  If NSVT continues will gradually up titrate mexitil.  It appears to have been frontloaded after his admission and has gradually improved.     CAD S/p LAD stent 3/11  by Dr. Gwenlyn Found With large dye burden and VT with stent deployment, will undergo staged PCI to RCA 3/13. Continue ASA and Brilinta, recommendation is 12 months uninterrupted.    Cardiomyopathy Prevously non ischemic, now with severe CAD Though Echo 3/12 with EF 55-60, Grade 1 DD.    Anxiety/ ?PTSD From multiple shocks.  Discussed with Dr. Michail Sermon who not able to see the patient himself, but he agrees a Psychology referral is appropriate. Will place today.  He denies feeling down or depressed.   Disposition:   Follow up with EP APP in 2 weeks  Signed, Shirley Friar, PA-C

## 2023-02-17 NOTE — Progress Notes (Signed)
VT zone 133 Intervals to detect changed from 32 to 20 and re-detect from 20 to 8. See PDF in Epic. Patient and patient wife voiced understanding of medication changes as well as 911/ER precautions

## 2023-02-17 NOTE — Telephone Encounter (Signed)
Per Colman Cater patient to double Amiodarone dose for 7 days and lower numbers to detect interval from 32 to 20 beats in 133 VT zone. Apt scheduled for 4:30 today with device clinic  Transmission reviewed, around the time of patient passing out he had a NS-VT episode, patient did not received any therapies, reviewed with Oda Kilts , Jonni Sanger is going to speak with MDs at hospital to see next steps informed patient and patients wife.

## 2023-02-17 NOTE — Patient Instructions (Addendum)
Medication Instructions:  1.Increase metoprolol succinate (Toprol XL) to 100 mg in the morning and 50 mg in the evening *If you need a refill on your cardiac medications before your next appointment, please call your pharmacy*  Lab Work: CMET, CBC, Mag today If you have labs (blood work) drawn today and your tests are completely normal, you will receive your results only by: [place] (if you have MyChart) OR A paper copy in the mail If you have any lab test that is abnormal or we need to change your treatment, we will call you to review the results.  Follow-Up: At [facility], you and your health needs are our priority.  As part of our continuing mission to provide you with exceptional heart care, we have created designated Provider Care Teams.  These Care Teams include your primary Cardiologist (physician) and Advanced Practice Providers (APPs -  Physician Assistants and Nurse Practitioners) who all work together to provide you with the care you need, when you need it.  Your next appointment:   2 week(s)  Provider:   Legrand Como "Oda Kilts, PA-C  We have placed a psychology referral as discussed. You will receive a call to schedule an appointment.

## 2023-02-17 NOTE — Telephone Encounter (Signed)
Patient called in stating he got shocked today only one time. Patient states he is SOB and chest pain but states the chest pain is from the shock. Patient is sending a transmission

## 2023-02-17 NOTE — Patient Instructions (Signed)
If you pass out over the weekend or receive a shock go straight to the ER or call 911

## 2023-02-18 LAB — CBC
Hematocrit: 45.2 % (ref 37.5–51.0)
Hemoglobin: 15.1 g/dL (ref 13.0–17.7)
MCH: 29.4 pg (ref 26.6–33.0)
MCHC: 33.4 g/dL (ref 31.5–35.7)
MCV: 88 fL (ref 79–97)
Platelets: 238 10*3/uL (ref 150–450)
RBC: 5.14 x10E6/uL (ref 4.14–5.80)
RDW: 13.4 % (ref 11.6–15.4)
WBC: 6.3 10*3/uL (ref 3.4–10.8)

## 2023-02-18 LAB — COMPREHENSIVE METABOLIC PANEL
ALT: 35 IU/L (ref 0–44)
AST: 34 IU/L (ref 0–40)
Albumin/Globulin Ratio: 1.8 (ref 1.2–2.2)
Albumin: 4.6 g/dL (ref 3.8–4.8)
Alkaline Phosphatase: 69 IU/L (ref 44–121)
BUN/Creatinine Ratio: 18 (ref 10–24)
BUN: 23 mg/dL (ref 8–27)
Bilirubin Total: 0.7 mg/dL (ref 0.0–1.2)
CO2: 18 mmol/L — ABNORMAL LOW (ref 20–29)
Calcium: 10.1 mg/dL (ref 8.6–10.2)
Chloride: 101 mmol/L (ref 96–106)
Creatinine, Ser: 1.28 mg/dL — ABNORMAL HIGH (ref 0.76–1.27)
Globulin, Total: 2.6 g/dL (ref 1.5–4.5)
Glucose: 125 mg/dL — ABNORMAL HIGH (ref 70–99)
Potassium: 4 mmol/L (ref 3.5–5.2)
Sodium: 140 mmol/L (ref 134–144)
Total Protein: 7.2 g/dL (ref 6.0–8.5)
eGFR: 59 mL/min/{1.73_m2} — ABNORMAL LOW (ref >59)

## 2023-02-18 LAB — MAGNESIUM: Magnesium: 2.3 mg/dL (ref 1.6–2.3)

## 2023-02-24 ENCOUNTER — Telehealth: Payer: Self-pay

## 2023-02-24 NOTE — Telephone Encounter (Signed)
Patient wanted clarification on his Amiodarone. Answered patient question to take Amiodarone 2 tablets (400 mg total) BID x7 days then 1 tablet (200 mg total) BID. Appreciative of call.

## 2023-02-24 NOTE — Telephone Encounter (Signed)
Patient called in wanting to know if he can get a call back about the new medication he was put on by [facility]

## 2023-02-27 ENCOUNTER — Telehealth: Payer: Self-pay

## 2023-02-27 NOTE — Telephone Encounter (Signed)
Remote transmission received.   Device shows normal device function. Patient is in [place]. 6 NSVT events logged, no therapies delivered.  Patient reports no improvement since leaving the hospital at most recent discharge. Reports generalized weakness, ringing in ears, palpitations at times.   Patient and wife express concerns why patient has not had signifcant issues over the past "10 years" and now patient has recent shocks an stents. Patient has 2 week f/u post hospital admission and request to see Dr. Caryl Comes instead of PA on 03/02/23. Advised I will forward to shedding and his nurse to advise. Voiced understanding.

## 2023-02-27 NOTE — Telephone Encounter (Signed)
Transmission received.

## 2023-02-27 NOTE — Telephone Encounter (Signed)
Pt wife called stating that he is not looking to good and wanted him to come in to see Caryl Comes. I told her the nurse will give her a call back.

## 2023-02-27 NOTE — Telephone Encounter (Signed)
I asked the patient wife Chong Sicilian to send another transmission for the nurse to review. I let her know that he is on schedule to see Jonni Sanger on 03/02/2023. She is insisting that he see Caryl Comes. She states he hasn't had a good day since he left the hospital. I told her let start with the Transmission and I will have the nurse to give her a call back.

## 2023-03-02 ENCOUNTER — Other Ambulatory Visit (INDEPENDENT_AMBULATORY_CARE_PROVIDER_SITE_OTHER): Payer: Medicare PPO

## 2023-03-02 ENCOUNTER — Encounter: Payer: Self-pay | Admitting: Internal Medicine

## 2023-03-02 ENCOUNTER — Ambulatory Visit: Payer: Medicare PPO | Admitting: Student

## 2023-03-02 ENCOUNTER — Ambulatory Visit: Payer: Medicare PPO | Attending: Student | Admitting: Internal Medicine

## 2023-03-02 VITALS — BP 100/58 | HR 76 | Ht 65.0 in | Wt 165.4 lb

## 2023-03-02 DIAGNOSIS — I472 Ventricular tachycardia, unspecified: Secondary | ICD-10-CM

## 2023-03-02 MED ORDER — RANOLAZINE ER 500 MG PO TB12: 500.0000 mg | ORAL_TABLET | Freq: Two times a day (BID) | ORAL | 3 refills | Status: DC

## 2023-03-02 NOTE — Patient Instructions (Addendum)
Medication Instructions:  Your physician has recommended you make the following change in your medication:   ** Stop Mexiletine  ** Start Ranolazine 500mg  - 1 tablet by mouth twice daily.  ** Increase Amiodarone 200mg  - 2 tablets by mouth twice daily x 7 days then decrease to 1- 200mg  tablet in the morning and 1- 200mg  tablet in the evening as currently prescribed.  *If you need a refill on your cardiac medications before your next appointment, please call your pharmacy*   Lab Work: None ordered.  If you have labs (blood work) drawn today and your tests are completely normal, you will receive your results only by: [name] (if you have MyChart) OR A paper copy in the mail If you have any lab test that is abnormal or we need to change your treatment, we will call you to review the results.   Testing/Procedures: ZIO AT Long term monitor-Live Telemetry  Your physician has requested you wear a ZIO patch monitor for 14 days.  This is a single patch monitor. [employer] supplies one patch monitor per enrollment. Additional  stickers are not available.  Please do not apply patch if you will be having a Nuclear Stress Test, Echocardiogram, Cardiac CT, MRI,  or Chest Xray during the period you would be wearing the monitor. The patch cannot be worn during  these tests. You cannot remove and re-apply the ZIO AT patch monitor.  Your ZIO patch monitor will be mailed 3 day USPS to your address on file. It may take 3-5 days to  receive your monitor after you have been enrolled.  Once you have received your monitor, please review the enclosed instructions. Your monitor has  already been registered assigning a specific monitor serial # to you.   Billing and Patient Assistance Program information  Dakota Dakota Gutierrez has been supplied with any insurance information on record for billing. [employer] offers a sliding scale Patient Assistance Program for patients without insurance, or whose  insurance does not  completely cover the cost of the ZIO patch monitor. You must apply for the  Patient Assistance Program to qualify for the discounted rate. To apply, call [employer] at [phone],  select option 4, select option 2 , ask to apply for the Patient Assistance Program, (you can request an  interpreter if needed). [employer] will ask your household income and how many people are in your  household. [employer] will quote your out-of-pocket cost based on this information. They will also be able  to set up a 12 month interest free payment plan if needed.  Applying the monitor   Shave hair from upper left chest.  Hold the abrader disc by orange tab. Rub the abrader in 40 strokes over left upper chest as indicated in  your monitor instructions.  Clean area with 4 enclosed alcohol pads. Use all pads to ensure the area is cleaned thoroughly. Let  dry.  Apply patch as indicated in monitor instructions. Patch will be placed under collarbone on left side of  chest with arrow pointing upward.  Rub patch adhesive wings for 2 minutes. Remove the white label marked "1". Remove the white label  marked "2". Rub patch adhesive wings for 2 additional minutes.  While looking in a mirror, press and release button in center of patch. A small Dakota Gutierrez light will flash 3-4  times. This will be your only indicator that the monitor has been turned on.  Do not shower for the first 24 hours. You may shower after the first  24 hours.  Press the button if you feel a symptom. You will hear a small click. Record Date, Time and Symptom in  the Patient Log.   Starting the Gateway  In your kit there is a Hydrographic surveyor box the size of a cellphone. This is Airline pilot. It transmits all your  recorded data to [facility]. This box must always stay within 10 feet of you. Open the box and push the *  button. There will be a light that blinks orange and then Dakota Gutierrez a few times. When the light stops  blinking, the Gateway is connected to the ZIO  patch. Call [employer] at [phone] to confirm your monitor is transmitting.  Returning your monitor  Remove your patch and place it inside the [place]. In the lower half of the Gateway there is a white  bag with prepaid postage on it. Place Gateway in bag and seal. Mail package back to [place] as soon as  possible. Your physician should have your final report approximately 7 days after you have mailed back  your monitor. Call [place] at [phone] if you have questions regarding your ZIO AT  patch monitor. Call them immediately if you see an orange light blinking on your monitor.  If your monitor falls off in less than 4 days, contact our Monitor department at [phone]. If your  monitor becomes loose or falls off after 4 days call [employer] at [phone] for suggestions on  securing your monitor     Follow-Up: At [facility], you and your health needs are our priority.  As part of our continuing mission to provide you with exceptional heart care, we have created designated Provider Care Teams.  These Care Teams include your primary Cardiologist (physician) and Advanced Practice Providers (APPs -  Physician Assistants and Nurse Practitioners) who all work together to provide you with the care you need, when you need it.  We recommend signing up for the patient portal called "MyChart".  Sign up information is provided on this After Visit Summary.  MyChart is used to connect with patients for Virtual Visits (Telemedicine).  Patients are able to view lab/test results, encounter notes, upcoming appointments, etc.  Non-urgent messages can be sent to your provider as well.   To learn more about what you can do with MyChart, go to [email or web address].    Your next appointment:   Keep appointment with Dakota Dakota Gutierrez as scheduled 05/04/2023 at 130pm.

## 2023-03-02 NOTE — Progress Notes (Unsigned)
ZIO AT serial # [id number] from office inventory applied to patient.

## 2023-03-02 NOTE — Progress Notes (Addendum)
Thank you very much yet      Patient Care Team: Wendie Agreste, MD as PCP - General (Family Medicine) [name], MD as PCP - Electrophysiology (Cardiology) [name], MD (Cardiology) [name], MD as Attending Physician (Neurology)   HPI  Dakota Gutierrez is a 72 y.o. 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 Unfortunately, in the days that followed he developed VT storm with multiple shocks and was treated with amiodarone which has been gradually down titrated to 100 mg a day.  Amiodarone surveillance had demonstrated elevated liver function tests spring 2013 . We elected to discontinue the amiodarone albeit with some reluctance on the part of the family. When seen in June 2022 of recurrent episodes of ventricular tachycardia 2 of which required therapy. He was then resumed on amiodarone and referred for consultation with GI  most recently 4/17 transaminases were normal.     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  He underwent catheter because of the response to lidocaine, he was found to have significantly worsening coronary artery disease with three-vessel involvement and underwent stepwise intervention with initially stenting of the LAD complicated by ventricular tachycardia and then subsequently by stenting of a mid RCA lesion.   Also associated with PTSD  1 interval syncopal episode.  This was associate with a slow ventricular tachycardia below detection that terminated spontaneously, cycle length varied  from 530--450 ms.  He continues to have spells associated with a shrill like tinnitus, shortness of breath, chest pain and a cold wave.  It is in his mind associated with ventricular tachycardia and has been in his mind terrifying  He has lost about 20 pounds in the last month although he says he is eating okay now  He and his wife are both extremely frustrated and disappointed at the course of events over the last 3 weeks following hospital discharge having thought that he would feel better having undergone stenting    Date Cr K Hgb TSH  ALT   5/18    3.84 25    3/19    3.73 27  8/19    3.4 24  3/20    3.25   12/20 0.97 4.4   21  }6/21 0.98 4.3   18  11/23 1.01 4.5 14.5    3/24 1.28 4.0 15.1 4.9 35   DATE TEST EF   2012 MYOVIEW 56% No Ischemia  5/17 Echo   45-50 %   11/23 Echo  55-60%   3/24 Echo  55-60%   3/24 LHC  LADp-m 75 +FFR>stent; RCAm-99>>stent; AM-95;RPLV100      Past Medical History:  Diagnosis Date   Anxiety    secondary to ICD shocks   Diabetes mellitus without complication    Dual implantable cardiac defibrillator in situ    Medtronic [id number] Protecta   Elevated transaminase level    10/2011  ? 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   Vitreomacular adhesion of right eye 02/15/2021    Past Surgical History:  Procedure Laterality Date   CARDIAC DEFIBRILLATOR PLACEMENT     Medtronic [id number] Protecta   CATARACT EXTRACTION, BILATERAL  2019   CHOLECYSTECTOMY     COLONOSCOPY WITH PROPOFOL N/A 06/30/2017   Procedure: COLONOSCOPY WITH PROPOFOL;  Surgeon: Carol Ada, MD;  Location: [facility];  Service: Endoscopy;  Laterality: N/A;   CORONARY PRESSURE/FFR STUDY N/A 02/06/2023   Procedure: INTRAVASCULAR PRESSURE WIRE/FFR STUDY;  Surgeon:  Lorretta Harp, MD;  Location: [place] [facility];  Service: Cardiovascular;  Laterality: N/A;   CORONARY STENT INTERVENTION N/A 02/06/2023   Procedure: CORONARY STENT INTERVENTION;  Surgeon: Lorretta Harp, MD;  Location: [place] [facility];  Service: Cardiovascular;  Laterality: N/A;   CORONARY STENT INTERVENTION N/A 02/08/2023   Procedure: CORONARY STENT INTERVENTION;  Surgeon: Sherren Mocha, MD;  Location: [place] [facility];  Service: Cardiovascular;  Laterality: N/A;   GALLBLADDER SURGERY     HERNIA REPAIR     ICD GENERATOR CHANGEOUT N/A 04/26/2017   Procedure: ICD Generator Changeout;  Surgeon: [name], MD;  Location: [place] [facility];  Service: Cardiovascular;  Laterality: N/A;   LEFT HEART CATH AND CORONARY ANGIOGRAPHY N/A 02/06/2023   Procedure: LEFT HEART CATH AND CORONARY ANGIOGRAPHY;  Surgeon: Lorretta Harp, MD;  Location: [place] [facility];  Service: Cardiovascular;  Laterality: N/A;   TONSILLECTOMY AND ADENOIDECTOMY      Current Outpatient Medications  Medication Sig Dispense Refill   acetaminophen (TYLENOL) 500 MG tablet Take 1,000 mg by mouth daily as needed for moderate pain or headache.     ALPRAZolam (XANAX) 0.25 MG tablet Take 1 tablet (0.25 mg total) by mouth 2 (two) times daily as needed for anxiety. 30 tablet 1   amiodarone (PACERONE) 200 MG tablet Take 1 tablet (200 mg total) by mouth 2 (two) times daily. 180 tablet 3   aspirin EC 81 MG tablet Take 1 tablet (81 mg total) by mouth daily.     atorvastatin (LIPITOR) 10 MG tablet TAKE 1 TABLET BY MOUTH EVERY DAY 90 tablet 2   Cholecalciferol (VITAMIN D) 2000 UNITS tablet Take 2,000 Units by mouth daily.     empagliflozin (JARDIANCE) 25 MG TABS tablet Take 1 tablet (25 mg total) by mouth daily before breakfast. 90 tablet 1   famotidine (PEPCID) 20 MG tablet Take 1 tablet (20 mg total) by mouth 2 (two) times daily as needed. 60 tablet 4   metFORMIN (GLUCOPHAGE) 500 MG tablet Take 1 tablet (500 mg total)  by mouth daily with breakfast. 90 tablet 3   metoprolol succinate (TOPROL-XL) 100 MG 24 hr tablet Take 1 tablet (100 mg) by mouth in the morning and 1/2 tablet (50 mg) by mouth in the evening with or immediately following a meal. 135 tablet 3   mexiletine (MEXITIL) 200 MG capsule Take 1 capsule (200 mg total) by mouth 2 (two) times daily. 180 capsule 0   olmesartan-hydrochlorothiazide (BENICAR HCT) 20-12.5 MG tablet TAKE 1 TABLET BY MOUTH EVERY DAY 90 tablet 2   Omega-3 Fatty Acids (FISH OIL) 1000 MG CAPS Take 1,000 mg by mouth 2 (two) times daily.     tadalafil (CIALIS) 20 MG tablet Take 0.5-1 tablets (10-20 mg total) by mouth daily as needed for erectile dysfunction. 10 tablet 11   ticagrelor (BRILINTA) 90 MG TABS tablet Take 1 tablet (90 mg total)  by mouth 2 (two) times daily. 60 tablet 6   No current facility-administered medications for this visit.    Allergies  Allergen Reactions   Adhesive [Tape]     Swelling and welts formed    Review of Systems negative except from HPI and PMH except for complaints of back pain and muscle pain  Physical Exam BP (!) 100/58   Pulse 76   Ht 5\' 5"  (1.651 m)   Wt 165 lb 6.4 oz (75 kg)   SpO2 97%   BMI 27.52 kg/m  Well developed and well nourished in no acute distress HENT normal Neck supple with JVP-flat Clear Device pocket well healed; without hematoma or erythema.  There is no tethering  Regular rate and rhythm, no  murmur Abd-soft with active BS No Clubbing cyanosis  edema Skin-warm and dry A & Oriented  Grossly normal sensory and motor function  ECG atrial pacing at 75 Intervals 18/12/42  Device function is normal. Programming changes were made to try to help improve detection of nonsustained ventricular tachycardia so the monitoring zone was programmed to 600 ms, max sensor rate was decreased to 95, VT detection was programmed on at 550 ms.  It is notable that his ventricular tachycardia episode on 3/22 was associated with VA  block See Paceart for details    Device function is normal. Programming changes creating a second VT zone from 177-222 and antitachycardia pacing See Paceart for details    Assessment and  Plan  VT recurrent  ICD Medtronic    Sinus noded dysfunction   Amiodarone rx   Ischemic heart disease  PTSD  Recurrent ventricular tachycardia following discharge was associated with syncope not withstanding it being slow.  It did not fall below detection.  Device was reprogrammed as above  We discussed the roles of antiarrhythmic therapies with amiodarone being slowing and the surprise response to lidocaine having strongly supported the idea of an ischemic trigger which prompted the catheterization and the surprising obstructive findings.  Mexiletine has not been particularly helpful as an adjunct, but with his residual coronary disease, ischemia may be further contributing so we will discontinue the mexiletine and use ranolazine adjunctively to amiodarone.  Discussed the side effects of nausea.  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  We will use a ZIO AT monitor to try to clarify if there is an arrhythmia association with these events.  I will reach out to Dr. Noralee Stain in [place] to see if he would consider catheter ablation in the event that we cannot order as an alternative to medications.  63 minutes

## 2023-03-03 ENCOUNTER — Telehealth: Payer: Self-pay | Admitting: Internal Medicine

## 2023-03-03 DIAGNOSIS — I472 Ventricular tachycardia, unspecified: Secondary | ICD-10-CM | POA: Diagnosis not present

## 2023-03-03 LAB — CUP PACEART INCLINIC DEVICE CHECK
Battery Remaining Longevity: 15 mo
Battery Voltage: 2.92 V
Brady Statistic AP VP Percent: 61.18 %
Brady Statistic AP VS Percent: 38.42 %
Brady Statistic AS VP Percent: 0 %
Brady Statistic AS VS Percent: 0.39 %
Brady Statistic RA Percent Paced: 99.2 %
Brady Statistic RV Percent Paced: 61.1 %
Date Time Interrogation Session: 20240404153300
HighPow Impedance: 44 Ohm
HighPow Impedance: 55 Ohm
Implantable Lead Connection Status: 753985
Implantable Lead Connection Status: 753985
Implantable Lead Implant Date: 20120531
Implantable Lead Implant Date: 20120531
Implantable Lead Location: 753859
Implantable Lead Location: 753860
Implantable Lead Model: 4076
Implantable Lead Model: 6947
Implantable Pulse Generator Implant Date: 20180530
Lead Channel Impedance Value: 399 Ohm
Lead Channel Impedance Value: 475 Ohm
Lead Channel Impedance Value: 475 Ohm
Lead Channel Pacing Threshold Amplitude: 0.75 V
Lead Channel Pacing Threshold Amplitude: 1 V
Lead Channel Pacing Threshold Pulse Width: 0.4 ms
Lead Channel Pacing Threshold Pulse Width: 0.4 ms
Lead Channel Sensing Intrinsic Amplitude: 0.5 mV
Lead Channel Sensing Intrinsic Amplitude: 0.5 mV
Lead Channel Sensing Intrinsic Amplitude: 12 mV
Lead Channel Sensing Intrinsic Amplitude: 12 mV
Lead Channel Setting Pacing Amplitude: 2 V
Lead Channel Setting Pacing Amplitude: 2 V
Lead Channel Setting Pacing Pulse Width: 0.4 ms
Lead Channel Setting Sensing Sensitivity: 0.3 mV
Zone Setting Status: 755011
Zone Setting Status: 755011

## 2023-03-03 NOTE — Telephone Encounter (Signed)
Pt c/o medication issue:  1. Name of Medication:   amiodarone (PACERONE) 200 MG tablet   2. How are you currently taking this medication (dosage and times per day)?   As prescribed  3. Are you having a reaction (difficulty breathing--STAT)?   No  4. What is your medication issue?   Patient is following-up on medication instructions discussed at visit and also wants a copy of the after visit summary emailed to him.

## 2023-03-03 NOTE — Telephone Encounter (Signed)
Spoke with pt and advised After Visit Summary has been sent to his MyChart and reviewed current Amiodarone instructions per recommendation of Dr Odessa Fleming OV 03/03/2023.  Pt asking if he receives another shock should he call 911 due to his recent circumstances of VT storm.  Pt advised if he does receive an ICD shock 911 should be contacted.  Pt verbalizes understanding and agrees with current plan.

## 2023-03-07 ENCOUNTER — Telehealth: Payer: Self-pay | Admitting: Family Medicine

## 2023-03-07 NOTE — Telephone Encounter (Signed)
Called patient to schedule Medicare Annual Wellness Visit (AWV). No voicemail available to leave a message.  Last date of AWV: 03/03/2022   Please schedule an AWVS appointment at any time with [facility] ANNUAL WELLNESS VISIT.  If any questions, please contact me at [phone].   Thank you,  [facility] Direct dial  [phone]

## 2023-03-08 ENCOUNTER — Other Ambulatory Visit (HOSPITAL_COMMUNITY): Payer: Self-pay

## 2023-03-15 ENCOUNTER — Ambulatory Visit (INDEPENDENT_AMBULATORY_CARE_PROVIDER_SITE_OTHER): Payer: Medicare PPO

## 2023-03-15 DIAGNOSIS — I472 Ventricular tachycardia, unspecified: Secondary | ICD-10-CM

## 2023-03-15 LAB — CUP PACEART REMOTE DEVICE CHECK
Battery Remaining Longevity: 16 mo
Battery Voltage: 2.93 V
Brady Statistic AP VP Percent: 54.62 %
Brady Statistic AP VS Percent: 44.83 %
Brady Statistic AS VP Percent: 0 %
Brady Statistic AS VS Percent: 0.55 %
Brady Statistic RA Percent Paced: 99.19 %
Brady Statistic RV Percent Paced: 54.55 %
Date Time Interrogation Session: 20240417001705
HighPow Impedance: 41 Ohm
HighPow Impedance: 50 Ohm
Implantable Lead Connection Status: 753985
Implantable Lead Connection Status: 753985
Implantable Lead Implant Date: 20120531
Implantable Lead Implant Date: 20120531
Implantable Lead Location: 753859
Implantable Lead Location: 753860
Implantable Lead Model: 4076
Implantable Lead Model: 6947
Implantable Pulse Generator Implant Date: 20180530
Lead Channel Impedance Value: 361 Ohm
Lead Channel Impedance Value: 456 Ohm
Lead Channel Impedance Value: 456 Ohm
Lead Channel Pacing Threshold Amplitude: 0.75 V
Lead Channel Pacing Threshold Amplitude: 0.875 V
Lead Channel Pacing Threshold Pulse Width: 0.4 ms
Lead Channel Pacing Threshold Pulse Width: 0.4 ms
Lead Channel Sensing Intrinsic Amplitude: 0.875 mV
Lead Channel Sensing Intrinsic Amplitude: 0.875 mV
Lead Channel Sensing Intrinsic Amplitude: 13.75 mV
Lead Channel Sensing Intrinsic Amplitude: 13.75 mV
Lead Channel Setting Pacing Amplitude: 2 V
Lead Channel Setting Pacing Amplitude: 2.25 V
Lead Channel Setting Pacing Pulse Width: 0.4 ms
Lead Channel Setting Sensing Sensitivity: 0.3 mV
Zone Setting Status: 755011
Zone Setting Status: 755011

## 2023-03-22 ENCOUNTER — Ambulatory Visit: Payer: Medicare PPO | Admitting: Family Medicine

## 2023-03-22 ENCOUNTER — Encounter: Payer: Self-pay | Admitting: Family Medicine

## 2023-03-22 VITALS — BP 112/60 | HR 76 | Temp 98.4°F | Ht 65.0 in | Wt 163.4 lb

## 2023-03-22 DIAGNOSIS — R6883 Chills (without fever): Secondary | ICD-10-CM | POA: Diagnosis not present

## 2023-03-22 DIAGNOSIS — H9313 Tinnitus, bilateral: Secondary | ICD-10-CM

## 2023-03-22 DIAGNOSIS — F418 Other specified anxiety disorders: Secondary | ICD-10-CM

## 2023-03-22 DIAGNOSIS — R634 Abnormal weight loss: Secondary | ICD-10-CM

## 2023-03-22 DIAGNOSIS — I472 Ventricular tachycardia, unspecified: Secondary | ICD-10-CM

## 2023-03-22 DIAGNOSIS — E1165 Type 2 diabetes mellitus with hyperglycemia: Secondary | ICD-10-CM | POA: Diagnosis not present

## 2023-03-22 DIAGNOSIS — Z7984 Long term (current) use of oral hypoglycemic drugs: Secondary | ICD-10-CM

## 2023-03-22 DIAGNOSIS — E119 Type 2 diabetes mellitus without complications: Secondary | ICD-10-CM | POA: Diagnosis not present

## 2023-03-22 LAB — COMPREHENSIVE METABOLIC PANEL
ALT: 34 U/L (ref 0–53)
AST: 30 U/L (ref 0–37)
Albumin: 4.2 g/dL (ref 3.5–5.2)
Alkaline Phosphatase: 40 U/L (ref 39–117)
BUN: 16 mg/dL (ref 6–23)
CO2: 25 mEq/L (ref 19–32)
Calcium: 9.4 mg/dL (ref 8.4–10.5)
Chloride: 101 mEq/L (ref 96–112)
Creatinine, Ser: 0.92 mg/dL (ref 0.40–1.50)
GFR: 82.23 mL/min (ref >60.00)
Glucose, Bld: 95 mg/dL (ref 70–99)
Potassium: 4.3 mEq/L (ref 3.5–5.1)
Sodium: 136 mEq/L (ref 135–145)
Total Bilirubin: 0.7 mg/dL (ref 0.2–1.2)
Total Protein: 6.8 g/dL (ref 6.0–8.3)

## 2023-03-22 LAB — CBC
HCT: 42 % (ref 39.0–52.0)
Hemoglobin: 14.5 g/dL (ref 13.0–17.0)
MCHC: 34.5 g/dL (ref 30.0–36.0)
MCV: 86.8 fl (ref 78.0–100.0)
Platelets: 180 10*3/uL (ref 150.0–400.0)
RBC: 4.84 Mil/uL (ref 4.22–5.81)
RDW: 14.2 % (ref 11.5–15.5)
WBC: 5.4 10*3/uL (ref 4.0–10.5)

## 2023-03-22 LAB — TSH: TSH: 3.81 u[IU]/mL (ref 0.35–5.50)

## 2023-03-22 LAB — HEMOGLOBIN A1C: Hgb A1c MFr Bld: 6.9 % — ABNORMAL HIGH (ref 4.6–6.5)

## 2023-03-22 NOTE — Patient Instructions (Signed)
Thanks for coming in today.  I know you have been through a lot, see information below on adjustment disorder as some of the symptoms could be related to anxiety or stress from the recent events but certainly can look into other causes.  As far as the ringing in the ears I am happy to refer you to ear nose and throat but because of the association previously with the arrhythmia it may be worth seeing the results of the monitor and decide from there.  Let me know if you would like me to refer you to a specialist.  I will also check some labs for the chills, weight loss, other symptoms but please recheck in 2 weeks, sooner if any new or worsening symptoms.  Hang in there and let me know if there are questions prior to next visit.  Return to the clinic or go to the nearest emergency room if any of your symptoms worsen or new symptoms occur.   Adjustment Disorder, Adult Adjustment disorder is a group of symptoms that can develop after a stressful life event, such as the loss of a job or a serious physical illness. The symptoms can affect how you feel, think, and act. They may also interfere with your relationships. Adjustment disorder increases your risk of suicide and substance abuse. If adjustment disorder is not managed early, it can make medical conditions that you already have worse. If the stressful life event persists, the disorder may continue and become a persistent form of adjustment disorder. What are the causes? This condition is caused by difficulty recovering from or coping with a stressful life event. What increases the risk? You are more likely to develop this condition if: You have had previous problems coping with life stressors. You are being treated for a long-term (chronic) illness. You are being treated for an illness that cannot be cured (terminal illness). You have a family history of mental illness. What are the signs or symptoms? Symptoms of this condition include: Behavioral  symptoms such as: Trouble doing daily tasks. Reckless driving. Poor work International aid/development worker. Ignoring bills. Avoiding family and friends. Impulsive actions. Emotional symptoms such as: Sadness, depression, or crying spells. Worrying a lot, or feeling nervous or anxious. Loss of enjoyment. Feelings of loss or hopelessness. Irritability. Thoughts of suicide. Physical symptoms such as: Change in appetite or weight. Complaining of feeling sick without being ill. Feeling dazed or disconnected. Nightmares. Trouble sleeping. Symptoms of this condition start within 3 months of the stressful event. They do not last more than 6 months, unless the stressful circumstances last longer. Normal grieving after the death of a loved one is not a symptom of this condition. How is this diagnosed? To diagnose this condition, your health care provider will ask about what has happened in your life and how it has affected you. He or she may also ask about your medical history and your use of medicines, alcohol, and other substances. Your health care provider may do a physical exam and order lab tests or other studies. You may be referred to a mental health specialist. How is this treated? Treatment options for this condition include: Counseling or talk therapy. Talk therapy is usually provided by mental health specialists. This therapy may be individual or may involve family members. Medicines. Certain medicines may help with depression, anxiety, and sleep. Support groups. These offer emotional support, advice, and guidance. They are made up of people who have had similar experiences. Observation and time. This is sometimes called watchful  waiting. In this treatment, health care providers monitor your health and behavior without other treatment. Adjustment disorder sometimes gets better on its own with time. Follow these instructions at home: Take over-the-counter and prescription medicines only as told by your  health care provider. Keep all follow-up visits. This is important. Contact trusted family and friends for support. Let them know what is going on with you and how they can help. Contact a health care provider if: Your symptoms do not improve in 6 months. Your symptoms get worse. Get help right away if: You have serious thoughts about hurting yourself or someone else. If you ever feel like you may hurt yourself or others, or have thoughts about taking your own life, get help right away. Go to your nearest emergency department or: Call your local emergency services (911 in the [place] Call a suicide crisis helpline, such as the National Suicide Prevention Lifeline at (331)584-1666 or 988 in the [place] This is open 24 hours a day in the [place] Text the Crisis Text Line at 251-451-9627 (in the [place] Summary Adjustment disorder is a group of symptoms that can develop after a stressful life event, such as the loss of a job or a serious physical illness. The symptoms can affect how you feel, think, and act. They may interfere with your relationships. Symptoms of this condition start within 3 months of the stressful event. They do not last more than 6 months, unless the stressful circumstances last longer. Treatment may include talk therapy, medicines, participation in a support group, or observation to see if symptoms improve. Contact your health care provider if your symptoms get worse or do not improve in 6 months. If you ever feel like you may hurt yourself or others, or have thoughts about taking your own life, get help right away. This information is not intended to replace advice given to you by your health care provider. Make sure you discuss any questions you have with your health care provider. Document Revised: 06/09/2021 Document Reviewed: 03/27/2020 Elsevier Patient Education  2023 [employer].

## 2023-03-22 NOTE — Progress Notes (Unsigned)
Subjective:  Patient ID: Dakota Gutierrez, male    DOB: Jan 20, 1951  Age: 72 y.o. MRN: [id number]  CC:  Chief Complaint  Patient presents with   Hospitalization Follow-up    Pt states no concerns     HPI Dakota Gutierrez presents for  Follow up. Here with spouse.   Cardiac: Admitted March 9 for ventricular tachycardia, Dr. Graciela Husbands.  Numerous ICD shocks previously.  Initially started on IV amiodarone, lidocaine heart catheterization on March 11, multiple areas of stenosis.  Drug-eluting stent placed in mid RCA, dual antiplatelet therapy with with aspirin and ticagrelor x 12 months.  EF 55 to 60% with grade 1 diastolic dysfunction, normal valves on echo 02/07/2023.  He subsequently followed up with cardiology and electrophysiology, most recent electrophysiology visit April 4.  Stopped mexiletine, started ranolazine 500 mg twice daily and increased amiodarone for 7 days.   Discussed some symptoms of tenderness and cold flushing, not sure if it was related to fast ventricular tachycardia or potentially related to some component of PTSD.  ZIO monitor ordered to clarify arrhythmia - finished last week. Still waiting on result discussion.   Still does not feel well. Feeling lightheaded with meds - unsure which one. Notices a few times per day after eating as well. Chills in body after taking meds and few times per day - more at night when still. No fever. Lower left chest pain when feeling chills and lightheaded - lasts few seconds. Not sure if device is discharging during these events. Ringing in the ears since hospitalization. High pitch. Persistent. Similar sx's in past prior to ICD discharge. Has not felt ICD d/c recently.  Sleeping off and on - getting better. Has been anxious with recent events. Has appt to see therapist on may 8th.  Some weight loss since hospital. Eating regular meals.   Glucose 126-140, no lows.  Lab Results  Component Value Date   HGBA1C 8.4 (H) 11/17/2022   Wt  Readings from Last 3 Encounters:  03/22/23 163 lb 6.4 oz (74.1 kg)  03/02/23 165 lb 6.4 oz (75 kg)  02/17/23 172 lb (78 kg)     Lab Results  Component Value Date   WBC 6.3 02/17/2023   HGB 15.1 02/17/2023   HCT 45.2 02/17/2023   MCV 88 02/17/2023   PLT 238 02/17/2023      03/22/2023   11:28 AM 11/17/2022   11:04 AM 03/03/2022    1:12 PM 03/03/2022    1:10 PM 11/25/2021    1:17 PM  Depression screen PHQ 2/9  Decreased Interest 0 0 0 0 0  Down, Depressed, Hopeless 0 0 0 0 0  PHQ - 2 Score 0 0 0 0 0  Altered sleeping 0 0   0  Tired, decreased energy 1 0   0  Change in appetite 0 0   0  Feeling bad or failure about yourself  0 0   0  Trouble concentrating 0 0   0  Moving slowly or fidgety/restless 0 0   0  Suicidal thoughts 0 0   0  PHQ-9 Score 1 0   0  Difficult doing work/chores Not difficult at all    Not difficult at all      03/22/2023   11:28 AM 06/04/2021   11:34 AM  GAD 7 : Generalized Anxiety Score  Nervous, Anxious, on Edge 1 0  Control/stop worrying 0 0  Worry too much - different things 0 0  Trouble relaxing 0  0  Restless 0 0  Easily annoyed or irritable 0 0  Afraid - awful might happen 0 0  Total GAD 7 Score 1 0  Anxiety Difficulty Not difficult at all          History Patient Active Problem List   Diagnosis Date Noted   Non-sustained ventricular tachycardia 02/05/2023   VT (ventricular tachycardia) 02/05/2023   Sinus node dysfunction 01/27/2023   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   Posterior vitreous detachment, both eyes 02/15/2021   Optic atrophy, 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   Hypertension associated with type 2 diabetes mellitus 11/14/2019   Type II diabetes mellitus 01/16/2015   Hypersomnolence 08/14/2012   History of Helicobacter pylori infection 04/16/2012   GERD (gastroesophageal reflux disease) 04/16/2012   Elevated transaminase level 02/02/2012   Obesity 12/29/2011   Dual implantable  cardioverter-defibrillator - Medtronic 09/06/2011   Syncope    Ventricular tachycardia (HCC)    Anxiety    Past Medical History:  Diagnosis Date   Anxiety    secondary to ICD shocks   Diabetes mellitus without complication    Dual implantable cardiac defibrillator in situ    Medtronic [id number] Protecta   Elevated transaminase level    10/2011  ? 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   Vitreomacular adhesion of right eye 02/15/2021   Past Surgical History:  Procedure Laterality Date   CARDIAC DEFIBRILLATOR PLACEMENT     Medtronic [id number] Protecta   CATARACT EXTRACTION, BILATERAL  2019   CHOLECYSTECTOMY     COLONOSCOPY WITH PROPOFOL N/A 06/30/2017   Procedure: COLONOSCOPY WITH PROPOFOL;  Surgeon: Jeani Hawking, MD;  Location: [facility];  Service: Endoscopy;  Laterality: N/A;   CORONARY PRESSURE/FFR STUDY N/A 02/06/2023   Procedure: INTRAVASCULAR PRESSURE WIRE/FFR STUDY;  Surgeon: Runell Gess, MD;  Location: [facility] INVASIVE CV LAB;  Service: Cardiovascular;  Laterality: N/A;   CORONARY STENT INTERVENTION N/A 02/06/2023   Procedure: CORONARY STENT INTERVENTION;  Surgeon: Runell Gess, MD;  Location: [facility] INVASIVE CV LAB;  Service: Cardiovascular;  Laterality: N/A;   CORONARY STENT INTERVENTION N/A 02/08/2023   Procedure: CORONARY STENT INTERVENTION;  Surgeon: Tonny Bollman, MD;  Location: [facility] INVASIVE CV LAB;  Service: Cardiovascular;  Laterality: N/A;   GALLBLADDER SURGERY     HERNIA REPAIR     ICD GENERATOR CHANGEOUT N/A 04/26/2017   Procedure: ICD Generator Changeout;  Surgeon: Duke Salvia, MD;  [facility] INVASIVE CV LAB;  Service: Cardiovascular;  Laterality: N/A;   LEFT HEART CATH AND CORONARY ANGIOGRAPHY N/A 02/06/2023   Procedure: LEFT HEART  CATH AND CORONARY ANGIOGRAPHY;  Surgeon: Runell Gess, MD;  Location: [facility] INVASIVE CV LAB;  Service: Cardiovascular;  Laterality: N/A;   TONSILLECTOMY AND ADENOIDECTOMY     Allergies  Allergen Reactions   Adhesive [Tape]     Swelling and welts formed   Prior to Admission medications   Medication Sig Start Date End Date Taking? Authorizing Provider  acetaminophen (TYLENOL) 500 MG tablet Take 1,000 mg by mouth daily as needed for moderate pain or headache.   Yes [provider]  ALPRAZolam (XANAX) 0.25 MG tablet Take 1 tablet (0.25 mg total) by mouth 2 (two) times daily as needed for anxiety.  02/09/23  Yes Graciella Freer, PA-C  amiodarone (PACERONE) 200 MG tablet Take 1 tablet (200 mg total) by mouth 2 (two) times daily. 02/17/23  Yes Graciella Freer, PA-C  aspirin EC 81 MG tablet Take 1 tablet (81 mg total) by mouth daily. 11/11/11  Yes Duke Salvia, MD  atorvastatin (LIPITOR) 10 MG tablet TAKE 1 TABLET BY MOUTH EVERY DAY 10/21/22  Yes Shade Flood, MD  Cholecalciferol (VITAMIN D) 2000 UNITS tablet Take 2,000 Units by mouth daily.   Yes [provider]  empagliflozin (JARDIANCE) 25 MG TABS tablet Take 1 tablet (25 mg total) by mouth daily before breakfast. 11/17/22  Yes Shade Flood, MD  famotidine (PEPCID) 20 MG tablet Take 1 tablet (20 mg total) by mouth 2 (two) times daily as needed. 04/16/12  Yes Pyrtle, Carie Caddy, MD  metFORMIN (GLUCOPHAGE) 500 MG tablet Take 1 tablet (500 mg total) by mouth daily with breakfast. 02/12/23  Yes Tillery, Mariam Dollar, PA-C  metoprolol succinate (TOPROL-XL) 100 MG 24 hr tablet Take 1 tablet (100 mg) by mouth in the morning and 1/2 tablet (50 mg) by mouth in the evening with or immediately following a meal. 02/17/23  Yes Graciella Freer, PA-C  olmesartan-hydrochlorothiazide (BENICAR HCT) 20-12.5 MG tablet TAKE 1 TABLET BY MOUTH EVERY DAY 10/21/22  Yes Shade Flood, MD  Omega-3 Fatty Acids (FISH OIL) 1000  MG CAPS Take 1,000 mg by mouth 2 (two) times daily.   Yes [provider]  ranolazine (RANEXA) 500 MG 12 hr tablet Take 1 tablet (500 mg total) by mouth 2 (two) times daily. 03/02/23  Yes Duke Salvia, MD  tadalafil (CIALIS) 20 MG tablet Take 0.5-1 tablets (10-20 mg total) by mouth daily as needed for erectile dysfunction. 09/15/16  Yes Shade Flood, MD  ticagrelor (BRILINTA) 90 MG TABS tablet Take 1 tablet (90 mg total) by mouth 2 (two) times daily. 02/09/23  Yes Graciella Freer, PA-C   Social History   Socioeconomic History   Marital status: Married    Spouse name: Not on file   Number of children: 0   Years of education: Not on file   Highest education level: Bachelor's degree (e.g., BA, AB, BS)  Occupational History   Occupation: Retired Runner, broadcasting/film/video  Tobacco Use   Smoking status: Never   Smokeless tobacco: Never  Vaping Use   Vaping Use: Never used  Substance and Sexual Activity   Alcohol use: Yes    Alcohol/week: 2.0 standard drinks of alcohol    Types: 2 Cans of beer per week    Comment: 4/week   Drug use: No   Sexual activity: Yes  Other Topics Concern   Not on file  Social History Narrative   Divorced. Education: [employer]. Exercise: walking 3 times a week for 30 minutes.   Retired Runner, broadcasting/film/video   Social Determinants of Corporate investment banker Strain: Low Risk  (03/03/2022)   Overall Financial Resource Strain (CARDIA)    Difficulty of Paying Living Expenses: Not hard at all  Food Insecurity: No Food Insecurity (02/10/2023)   Hunger Vital Sign    Worried About Running Out of Food in the Last Year: Never true    Ran Out of Food in the Last Year: Never true  Transportation Needs: No Transportation Needs (02/10/2023)   PRAPARE - Administrator, Civil Service (Medical): No    Lack of Transportation (Non-Medical): No  Physical Activity: Sufficiently Active (03/03/2022)   Exercise Vital  Sign    Days of Exercise per Week: 4 days    Minutes of  Exercise per Session: 50 min  Stress: No Stress Concern Present (03/03/2022)   [employer] of Occupational Health - Occupational Stress Questionnaire    Feeling of Stress : Not at all  Social Connections: Moderately Integrated (03/03/2022)   Social Connection and Isolation Panel [NHANES]    Frequency of Communication with Friends and Family: Twice a week    Frequency of Social Gatherings with Friends and Family: Twice a week    Attends Religious Services: More than 4 times per year    Active Member of [employer] or Organizations: No    Attends Banker Meetings: Never    Marital Status: Married  Catering manager Violence: Not At Risk (02/05/2023)   Humiliation, Afraid, Rape, and Kick questionnaire    Fear of Current or Ex-Partner: No    Emotionally Abused: No    Physically Abused: No    Sexually Abused: No    Review of Systems Per HPI.   Objective:   Vitals:   03/22/23 1132  BP: 112/60  Pulse: 76  Temp: 98.4 F (36.9 C)  TempSrc: Temporal  SpO2: 98%  Weight: 163 lb 6.4 oz (74.1 kg)  Height: 5\' 5"  (1.651 m)     Physical Exam Vitals reviewed.  Constitutional:      Appearance: He is well-developed.  HENT:     Head: Normocephalic and atraumatic.  Neck:     Vascular: No carotid bruit or JVD.  Cardiovascular:     Rate and Rhythm: Normal rate and regular rhythm.     Heart sounds: Normal heart sounds. No murmur heard. Pulmonary:     Effort: Pulmonary effort is normal.     Breath sounds: Normal breath sounds. No rales.  Musculoskeletal:     Right lower leg: No edema.     Left lower leg: No edema.  Skin:    General: Skin is warm and dry.  Neurological:     Mental Status: He is alert and oriented to person, place, and time.  Psychiatric:        Mood and Affect: Mood normal.        Assessment & Plan:  Dakota Gutierrez is a 72 y.o. male . Chills Ventricular tachycardia (HCC) Tinnitus of both ears Weight loss - Plan: Comprehensive metabolic panel,  CBC, TSH  -Intermittent symptoms of chills, tinnitus, lightheadedness, few times per day, possibly medication associated.  Not sure if these could be side effects with meds versus symptoms of V. tach, recent monitoring should help determine if linked.  Afebrile in office, reassuring exam.  Check labs as above with CBC, TSH CMP.  Will also discuss weight loss and follow-up, could BE related to his recent illness.  Close follow-up after labs with ER precautions given.  -Tinnitus also intermittent, had reported similar symptoms with arrhythmia in the past.  Would consider ENT eval if persistent or new make-up will cause to arrhythmia as above.  RTC/ER precautions.  Situational anxiety  -Understandable given recent health concerns and hospitalization.  Handout given on adjustment disorder, declined new medications at this time.  Will discuss further at follow-up in the next 2 weeks, sooner if needed.  Type 2 diabetes mellitus with hyperglycemia, without long-term current use of insulin (HCC) - Plan: Hemoglobin A1c  -Check updated labs, continue metformin, Jardiance.  No orders of the defined types were placed in this encounter.  Patient Instructions  Thanks for coming in  today.  I know you have been through a lot, see information below on adjustment disorder as some of the symptoms could be related to anxiety or stress from the recent events but certainly can look into other causes.  As far as the ringing in the ears I am happy to refer you to ear nose and throat but because of the association previously with the arrhythmia it may be worth seeing the results of the monitor and decide from there.  Let me know if you would like me to refer you to a specialist.  I will also check some labs for the chills, weight loss, other symptoms but please recheck in 2 weeks, sooner if any new or worsening symptoms.  Hang in there and let me know if there are questions prior to next visit.  Return to the clinic or go to  the nearest emergency room if any of your symptoms worsen or new symptoms occur.   Adjustment Disorder, Adult Adjustment disorder is a group of symptoms that can develop after a stressful life event, such as the loss of a job or a serious physical illness. The symptoms can affect how you feel, think, and act. They may also interfere with your relationships. Adjustment disorder increases your risk of suicide and substance abuse. If adjustment disorder is not managed early, it can make medical conditions that you already have worse. If the stressful life event persists, the disorder may continue and become a persistent form of adjustment disorder. What are the causes? This condition is caused by difficulty recovering from or coping with a stressful life event. What increases the risk? You are more likely to develop this condition if: You have had previous problems coping with life stressors. You are being treated for a long-term (chronic) illness. You are being treated for an illness that cannot be cured (terminal illness). You have a family history of mental illness. What are the signs or symptoms? Symptoms of this condition include: Behavioral symptoms such as: Trouble doing daily tasks. Reckless driving. Poor work International aid/development worker. Ignoring bills. Avoiding family and friends. Impulsive actions. Emotional symptoms such as: Sadness, depression, or crying spells. Worrying a lot, or feeling nervous or anxious. Loss of enjoyment. Feelings of loss or hopelessness. Irritability. Thoughts of suicide. Physical symptoms such as: Change in appetite or weight. Complaining of feeling sick without being ill. Feeling dazed or disconnected. Nightmares. Trouble sleeping. Symptoms of this condition start within 3 months of the stressful event. They do not last more than 6 months, unless the stressful circumstances last longer. Normal grieving after the death of a loved one is not a symptom of this  condition. How is this diagnosed? To diagnose this condition, your health care provider will ask about what has happened in your life and how it has affected you. He or she may also ask about your medical history and your use of medicines, alcohol, and other substances. Your health care provider may do a physical exam and order lab tests or other studies. You may be referred to a mental health specialist. How is this treated? Treatment options for this condition include: Counseling or talk therapy. Talk therapy is usually provided by mental health specialists. This therapy may be individual or may involve family members. Medicines. Certain medicines may help with depression, anxiety, and sleep. Support groups. These offer emotional support, advice, and guidance. They are made up of people who have had similar experiences. Observation and time. This is sometimes called watchful waiting. In this  treatment, health care providers monitor your health and behavior without other treatment. Adjustment disorder sometimes gets better on its own with time. Follow these instructions at home: Take over-the-counter and prescription medicines only as told by your health care provider. Keep all follow-up visits. This is important. Contact trusted family and friends for support. Let them know what is going on with you and how they can help. Contact a health care provider if: Your symptoms do not improve in 6 months. Your symptoms get worse. Get help right away if: You have serious thoughts about hurting yourself or someone else. If you ever feel like you may hurt yourself or others, or have thoughts about taking your own life, get help right away. Go to your nearest emergency department or: Call your local emergency services (911 in the [place] Call a suicide crisis helpline, such as the National Suicide Prevention Lifeline at 213-795-5990 or 988 in the [place] This is open 24 hours a day in the [place] Text the  Crisis Text Line at 409-043-0180 (in the [place] Summary Adjustment disorder is a group of symptoms that can develop after a stressful life event, such as the loss of a job or a serious physical illness. The symptoms can affect how you feel, think, and act. They may interfere with your relationships. Symptoms of this condition start within 3 months of the stressful event. They do not last more than 6 months, unless the stressful circumstances last longer. Treatment may include talk therapy, medicines, participation in a support group, or observation to see if symptoms improve. Contact your health care provider if your symptoms get worse or do not improve in 6 months. If you ever feel like you may hurt yourself or others, or have thoughts about taking your own life, get help right away. This information is not intended to replace advice given to you by your health care provider. Make sure you discuss any questions you have with your health care provider. Document Revised: 06/09/2021 Document Reviewed: 03/27/2020 Elsevier Patient Education  2023 Elsevier Inc.      Signed,   Meredith Staggers, MD [facility], [facility] 03/22/23 12:08 PM

## 2023-03-26 ENCOUNTER — Encounter: Payer: Self-pay | Admitting: Family Medicine

## 2023-03-29 ENCOUNTER — Telehealth: Payer: Self-pay | Admitting: Family Medicine

## 2023-03-29 NOTE — Telephone Encounter (Signed)
Called patient to schedule Medicare Annual Wellness Visit (AWV). Left message for patient to call back and schedule Medicare Annual Wellness Visit (AWV).  Last date of AWV: 03/03/2022   Please schedule an AWVS appointment at any time with [facility] ANNUAL WELLNESS VISIT.  If any questions, please contact me at [phone].    Thank you,  Furman Trentman  Ambulatory Clinic Support [facility] Direct dial  [phone]   

## 2023-03-31 ENCOUNTER — Telehealth: Payer: Self-pay | Admitting: Internal Medicine

## 2023-03-31 NOTE — Telephone Encounter (Signed)
Patient called to follow-up on results of recent test.

## 2023-03-31 NOTE — Telephone Encounter (Signed)
Spoke with pt who is calling regarding re: monitor results.  Pt has not yet reviewed but once he has done this we will contact pt with result and any further recommendations.  Pt verbalizes understanding and agrees with current plan

## 2023-04-03 ENCOUNTER — Other Ambulatory Visit (HOSPITAL_COMMUNITY): Payer: Self-pay

## 2023-04-05 ENCOUNTER — Ambulatory Visit (INDEPENDENT_AMBULATORY_CARE_PROVIDER_SITE_OTHER): Payer: Medicare PPO | Admitting: Behavioral Health

## 2023-04-05 DIAGNOSIS — F419 Anxiety disorder, unspecified: Secondary | ICD-10-CM | POA: Diagnosis not present

## 2023-04-05 NOTE — Progress Notes (Signed)
                Boone Gear L Faryal Marxen, LMFT 

## 2023-04-06 ENCOUNTER — Telehealth: Payer: Self-pay

## 2023-04-06 NOTE — Telephone Encounter (Signed)
The patient wife wants an update about his zio monitor results. I told her I will send Dr. Graciela Husbands nurse a phone note.

## 2023-04-07 NOTE — Progress Notes (Signed)
[facility] Counselor Initial Adult Exam  Name: Dakota Gutierrez Date: 04/07/2023 MRN: [id number] DOB: 07-16-1951 PCP: Shade Flood, MD  Time spent: 60 min In Person @ [facility] (majority of visit w/Wife Patty today)  Guardian/Payee:  Self    Paperwork requested: No   Reason for Visit /Presenting Problem: Pt is exp'g elevated anx/dep since his [facility] Admission for issues w/his Pacer/Defibrillator; Pt hosp'd for 5days  Mental Status Exam: Appearance:   Casual     Behavior:  Appropriate and Sharing  Motor:  Normal  Speech/Language:   Clear and Coherent and Normal Rate  Affect:  Appropriate  Mood:  anxious  Thought process:  normal  Thought content:    WNL  Sensory/Perceptual disturbances:    WNL  Orientation:  oriented to person, place, and time/date  Attention:  Good  Concentration:  Good  Memory:  WNL  Fund of knowledge:   Good  Insight:    Good  Judgment:   Good  Impulse Control:  Good   Risk Assessment: Danger to Self:  No Self-injurious Behavior: No Danger to Others: No Duty to Warn:no Physical Aggression / Violence:No  Access to Firearms a concern: No  Gang Involvement:No  Patient / guardian was educated about steps to take if suicide or homicide risk level increases between visits: yes; appropriate to ICD process While future psychiatric events cannot be accurately predicted, the patient does not currently require acute inpatient psychiatric care and does not currently meet [facility] involuntary commitment criteria.  Substance Abuse History: Current substance abuse: No     Past Psychiatric History:   No previous psychological problems have been observed Outpatient Providers: Dr. Sherryl Manges, MD; Cardiologist History of Psych Hospitalization: No  Psychological Testing:  NA    Abuse History:  Victim of: No.,  NA    Report needed: No. Victim of Neglect:No. Perpetrator of  NA   Witness / Exposure to Domestic Violence:  No   Protective Services Involvement: No  Witness to [employer] Violence:  No   Family History:  Family History  Problem Relation Age of Onset   Hyperlipidemia Mother    Heart disease Father    Colon cancer Neg Hx    Esophageal cancer Neg Hx    Stomach cancer Neg Hx    Rectal cancer Neg Hx     Living situation: the patient lives with their spouse  Sexual Orientation: Straight  Relationship Status: married  Name of spouse / other: Patty If a parent, number of children / ages: Ea have 2 children from prior marriages  Support Systems: spouse friends [employer] to some extent  Financial Stress:  No   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 [employer] monthly  Stressors: Health problems   Loss of sense of self-efficacy for his cardiac health    Strengths: Supportive Relationships, Family, Friends, Church, Journalist, newspaper, and Able to Communicate Effectively  Barriers:  None noted today   Legal History: Pending legal issue / charges: The patient has no significant history of legal issues. History of legal issue / charges:  NA  Medical History/Surgical History: reviewed  Past Medical History:  Diagnosis Date   Anxiety    secondary to ICD shocks   Diabetes mellitus without complication (HCC)    Dual implantable cardiac defibrillator in situ    Medtronic [id number] Protecta   Elevated transaminase level    10/2011  ? 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   Vitreomacular adhesion of right eye 02/15/2021    Past Surgical History:  Procedure Laterality Date   CARDIAC DEFIBRILLATOR PLACEMENT     Medtronic [id number] Protecta   CATARACT EXTRACTION, BILATERAL  2019   CHOLECYSTECTOMY     COLONOSCOPY WITH PROPOFOL N/A 06/30/2017   Procedure: COLONOSCOPY WITH PROPOFOL;  Surgeon: Jeani Hawking, MD;  Location: [facility];  Service: Endoscopy;  Laterality: N/A;   CORONARY PRESSURE/FFR STUDY N/A 02/06/2023   Procedure: INTRAVASCULAR PRESSURE WIRE/FFR STUDY;  Surgeon: Runell Gess, MD;  Location: [facility] INVASIVE CV LAB;  Service: Cardiovascular;  Laterality: N/A;   CORONARY STENT INTERVENTION N/A 02/06/2023   Procedure: CORONARY STENT INTERVENTION;  Surgeon: Runell Gess, MD;  Location: [facility] INVASIVE CV LAB;  Service: Cardiovascular;  Laterality: N/A;   CORONARY STENT INTERVENTION N/A 02/08/2023   Procedure: CORONARY STENT INTERVENTION;  Surgeon: Tonny Bollman, MD;  Location: [facility] INVASIVE CV LAB;  Service: Cardiovascular;  Laterality: N/A;   GALLBLADDER SURGERY     HERNIA REPAIR     ICD GENERATOR CHANGEOUT N/A 04/26/2017   Procedure: ICD Generator Changeout;  Surgeon: Duke Salvia, MD;  Location: [facility] INVASIVE CV LAB;  Service: Cardiovascular;  Laterality: N/A;   LEFT HEART CATH AND CORONARY ANGIOGRAPHY N/A 02/06/2023   Procedure: LEFT HEART CATH AND CORONARY ANGIOGRAPHY;  Surgeon: Runell Gess, MD;  Location: [facility] INVASIVE CV LAB;  Service: Cardiovascular;  Laterality: N/A;   TONSILLECTOMY AND ADENOIDECTOMY      Medications: Current Outpatient Medications  Medication Sig Dispense Refill   acetaminophen (TYLENOL) 500 MG tablet Take 1,000 mg by mouth daily as needed for moderate pain or headache.     ALPRAZolam (XANAX) 0.25 MG tablet Take 1 tablet (0.25 mg total) by mouth 2 (two) times daily as  needed for anxiety. 30 tablet 1   amiodarone (PACERONE) 200 MG tablet Take 1 tablet (200 mg total) by mouth 2 (two) times daily. 180 tablet 3   aspirin EC 81 MG tablet Take 1 tablet (81 mg total) by mouth daily.     atorvastatin (LIPITOR) 10 MG tablet TAKE 1 TABLET BY MOUTH EVERY DAY 90 tablet 2   Cholecalciferol (VITAMIN D) 2000 UNITS tablet Take 2,000 Units by mouth daily.     empagliflozin (JARDIANCE) 25 MG TABS tablet Take 1 tablet (25 mg total) by mouth daily before breakfast. 90 tablet 1   famotidine (PEPCID) 20 MG tablet Take 1 tablet (20 mg total) by mouth 2 (two) times daily as needed. 60 tablet 4   metFORMIN (GLUCOPHAGE) 500 MG tablet Take 1 tablet (500 mg total) by mouth daily with breakfast. 90 tablet 3   metoprolol succinate (TOPROL-XL) 100 MG 24 hr tablet Take 1 tablet (100 mg) by mouth in the morning and 1/2 tablet (50 mg) by mouth in the evening with or immediately following a meal. 135 tablet 3   olmesartan-hydrochlorothiazide (BENICAR HCT) 20-12.5 MG  tablet TAKE 1 TABLET BY MOUTH EVERY DAY 90 tablet 2   Omega-3 Fatty Acids (FISH OIL) 1000 MG CAPS Take 1,000 mg by mouth 2 (two) times daily.     ranolazine (RANEXA) 500 MG 12 hr tablet Take 1 tablet (500 mg total) by mouth 2 (two) times daily. 180 tablet 3   tadalafil (CIALIS) 20 MG tablet Take 0.5-1 tablets (10-20 mg total) by mouth daily as needed for erectile dysfunction. 10 tablet 11   ticagrelor (BRILINTA) 90 MG TABS tablet Take 1 tablet (90 mg total) by mouth 2 (two) times daily. 60 tablet 6   No current facility-administered medications for this visit.    Allergies  Allergen Reactions   Adhesive [Tape]     Swelling and welts formed    Diagnoses:  Anxiety  Plan of Care: Tom & Alexia Freestone will attend sessions as scheduled every 3-4 wks to assist him in his adjustment to recent initiation of the Defibrillator installed in his chest. Tom c/o elevated anxiety since his [facility] Admission in Mar 2024. He will use the  tips/tools/suggestions offered today & see if this assists him to control his anxiousness.   Target Date: 05/28/2023  Progress: 3  Frequency: Once every 3-4 wks  Modality: Cpl Th   Deneise Lever, LMFT

## 2023-04-10 NOTE — Telephone Encounter (Signed)
Results were sent to the pt through MyChart on 04/06/2023 at 919am and have been viewed.

## 2023-04-11 ENCOUNTER — Telehealth: Payer: Self-pay | Admitting: Family Medicine

## 2023-04-11 DIAGNOSIS — H9313 Tinnitus, bilateral: Secondary | ICD-10-CM

## 2023-04-11 NOTE — Telephone Encounter (Signed)
Contacted Celedonio Miyamoto Amaral to schedule their annual wellness visit. Appointment made for 04/13/2023.  Thank you,  [facility] Direct dial  [phone]

## 2023-04-11 NOTE — Telephone Encounter (Signed)
Pt is asking if he can get an ENT referral he states this was discussed at his last visit ? Is it ok to place referral ?

## 2023-04-11 NOTE — Telephone Encounter (Signed)
I have left pt a vm stating that the ENT and to allow up to 2 weeks to get a call

## 2023-04-11 NOTE — Telephone Encounter (Signed)
Sure, referral ordered.

## 2023-04-11 NOTE — Telephone Encounter (Signed)
Caller name: GYASI COVER  On DPR?: Yes  Call back number: [phone]  Provider they see: Shade Flood, MD  Reason for call:  Referral to ENT which you spoke about last visit.

## 2023-04-17 NOTE — Progress Notes (Signed)
Remote ICD transmission.   

## 2023-04-19 ENCOUNTER — Ambulatory Visit (INDEPENDENT_AMBULATORY_CARE_PROVIDER_SITE_OTHER): Payer: Medicare PPO | Admitting: *Deleted

## 2023-04-19 DIAGNOSIS — Z0000 Encounter for general adult medical examination without abnormal findings: Secondary | ICD-10-CM

## 2023-04-19 NOTE — Progress Notes (Signed)
Subjective:   Dakota Gutierrez is a 72 y.o. male who presents for Medicare Annual/Subsequent preventive examination.  I connected with  Dakota Gutierrez on 04/19/23 by a telephone enabled telemedicine application and verified that I am speaking with the correct person using two identifiers.   I discussed the limitations of evaluation and management by telemedicine. The patient expressed understanding and agreed to proceed.  Patient location: home  Provider location: telephone/home    Review of Systems     Cardiac Risk Factors include: advanced age (>88men, >72 women);male gender;family history of premature cardiovascular disease;diabetes mellitus;hypertension     Objective:    Today's Vitals   04/19/23 1343  PainSc: 3    There is no height or weight on file to calculate BMI.     04/19/2023    1:49 PM 02/05/2023   10:47 AM 02/05/2023   12:02 AM 03/03/2022    1:12 PM 06/23/2020   11:47 AM 12/27/2018    2:32 PM 12/26/2017    1:54 PM  Advanced Directives  Does Patient Have a Medical Advance Directive? Yes  No Yes Yes Yes Yes  Type of Clinical research associate of [place];Living will Living will Living will;Healthcare Power of [place];Out of facility DNR (pink MOST or yellow form) Healthcare Power of [place];Living will  Does patient want to make changes to medical advance directive?     No - Patient declined No - Patient declined   Copy of Healthcare Power of Attorney in Chart? Yes - validated most recent copy scanned in chart (See row information)   No - copy requested  No - copy requested No - copy requested  Would patient like information on creating a medical advance directive?  No - Patient declined         Current Medications (verified) Outpatient Encounter Medications as of 04/19/2023  Medication Sig   acetaminophen (TYLENOL) 500 MG tablet Take 1,000 mg by mouth daily as needed for moderate pain or headache.   ALPRAZolam  (XANAX) 0.25 MG tablet Take 1 tablet (0.25 mg total) by mouth 2 (two) times daily as needed for anxiety.   amiodarone (PACERONE) 200 MG tablet Take 1 tablet (200 mg total) by mouth 2 (two) times daily.   aspirin EC 81 MG tablet Take 1 tablet (81 mg total) by mouth daily.   atorvastatin (LIPITOR) 10 MG tablet TAKE 1 TABLET BY MOUTH EVERY DAY   Cholecalciferol (VITAMIN D) 2000 UNITS tablet Take 2,000 Units by mouth daily.   empagliflozin (JARDIANCE) 25 MG TABS tablet Take 1 tablet (25 mg total) by mouth daily before breakfast.   famotidine (PEPCID) 20 MG tablet Take 1 tablet (20 mg total) by mouth 2 (two) times daily as needed.   metFORMIN (GLUCOPHAGE) 500 MG tablet Take 1 tablet (500 mg total) by mouth daily with breakfast.   metoprolol succinate (TOPROL-XL) 100 MG 24 hr tablet Take 1 tablet (100 mg) by mouth in the morning and 1/2 tablet (50 mg) by mouth in the evening with or immediately following a meal.   olmesartan-hydrochlorothiazide (BENICAR HCT) 20-12.5 MG tablet TAKE 1 TABLET BY MOUTH EVERY DAY   Omega-3 Fatty Acids (FISH OIL) 1000 MG CAPS Take 1,000 mg by mouth 2 (two) times daily.   ranolazine (RANEXA) 500 MG 12 hr tablet Take 1 tablet (500 mg total) by mouth 2 (two) times daily.   tadalafil (CIALIS) 20 MG tablet Take 0.5-1 tablets (10-20 mg total) by mouth daily as needed  for erectile dysfunction.   ticagrelor (BRILINTA) 90 MG TABS tablet Take 1 tablet (90 mg total) by mouth 2 (two) times daily.   No facility-administered encounter medications on file as of 04/19/2023.    Allergies (verified) Adhesive [tape]   History: Past Medical History:  Diagnosis Date   Anxiety    secondary to ICD shocks   Diabetes mellitus without complication (HCC)    Dual implantable cardiac defibrillator in situ    Medtronic [id number] Protecta   Elevated transaminase level    10/2011  ? 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   Vitreomacular adhesion of right eye 02/15/2021   Past Surgical History:  Procedure Laterality Date   CARDIAC DEFIBRILLATOR PLACEMENT     Medtronic [id number] Protecta   CATARACT EXTRACTION, BILATERAL  2019   CHOLECYSTECTOMY     COLONOSCOPY WITH PROPOFOL N/A 06/30/2017   Procedure: COLONOSCOPY WITH PROPOFOL;  Surgeon: Jeani Hawking, MD;  Location: [facility];  Service: Endoscopy;  Laterality: N/A;   CORONARY PRESSURE/FFR STUDY N/A 02/06/2023   Procedure: INTRAVASCULAR PRESSURE WIRE/FFR STUDY;  Surgeon: Runell Gess, MD;  Location: [facility] INVASIVE CV LAB;  Service: Cardiovascular;  Laterality: N/A;   CORONARY STENT INTERVENTION N/A 02/06/2023   Procedure: CORONARY STENT INTERVENTION;  Surgeon: Runell Gess, MD;  Location: [facility] INVASIVE CV LAB;  Service: Cardiovascular;  Laterality: N/A;   CORONARY STENT INTERVENTION N/A 02/08/2023   Procedure: CORONARY STENT INTERVENTION;  Surgeon: Tonny Bollman, MD;  Location: [facility] INVASIVE CV LAB;  Service: Cardiovascular;  Laterality: N/A;   GALLBLADDER SURGERY     HERNIA REPAIR     ICD GENERATOR CHANGEOUT N/A 04/26/2017   Procedure: ICD Generator Changeout;  Surgeon: Duke Salvia, MD;  Location: [facility] INVASIVE CV LAB;  Service: Cardiovascular;  Laterality: N/A;   LEFT HEART CATH AND CORONARY ANGIOGRAPHY N/A 02/06/2023   Procedure: LEFT HEART CATH AND CORONARY ANGIOGRAPHY;  Surgeon: Runell Gess, MD;  Location: [facility] INVASIVE CV LAB;  Service: Cardiovascular;  Laterality: N/A;   TONSILLECTOMY AND ADENOIDECTOMY     Family History  Problem Relation Age of Onset   Hyperlipidemia Mother    Heart disease Father    Colon cancer Neg Hx    Esophageal cancer Neg Hx    Stomach cancer Neg Hx    Rectal cancer Neg Hx    Social History   Socioeconomic History   Marital status: Married    Spouse name: Not  on file   Number of children: 0   Years of education: Not on file   Highest education level: Bachelor's degree (e.g., BA, AB, BS)  Occupational History   Occupation: Retired Runner, broadcasting/film/video  Tobacco Use   Smoking status: Never   Smokeless tobacco: Never  Vaping Use   Vaping Use: Never used  Substance and Sexual Activity   Alcohol use: Yes    Alcohol/week: 2.0 standard drinks of alcohol    Types: 2 Cans of beer per week    Comment: 4/week   Drug use: No   Sexual activity: Yes  Other Topics Concern   Not on file  Social History Narrative   Divorced. Education: [employer]. Exercise: walking 3 times a week for 30 minutes.   Retired Lobbyist  Determinants of Health   Financial Resource Strain: Low Risk  (03/03/2022)   Overall Financial Resource Strain (CARDIA)    Difficulty of Paying Living Expenses: Not hard at all  Food Insecurity: No Food Insecurity (04/19/2023)   Hunger Vital Sign    Worried About Running Out of Food in the Last Year: Never true    Ran Out of Food in the Last Year: Never true  Transportation Needs: No Transportation Needs (04/19/2023)   PRAPARE - Administrator, Civil Service (Medical): No    Lack of Transportation (Non-Medical): No  Physical Activity: Insufficiently Active (04/19/2023)   Exercise Vital Sign    Days of Exercise per Week: 4 days    Minutes of Exercise per Session: 30 min  Stress: No Stress Concern Present (03/03/2022)   [employer] of Occupational Health - Occupational Stress Questionnaire    Feeling of Stress : Not at all  Social Connections: Moderately Isolated (04/19/2023)   Social Connection and Isolation Panel [NHANES]    Frequency of Communication with Friends and Family: More than three times a week    Frequency of Social Gatherings with Friends and Family: Three times a week    Attends Religious Services: More than 4 times per year    Active Member of Clubs or Organizations: No    Attends Banker Meetings:  Never    Marital Status: Divorced    Tobacco Counseling Counseling given: Not Answered   Clinical Intake:  Pre-visit preparation completed: Yes  Pain : 0-10 Pain Score: 3  Pain Type: Chronic pain Pain Location:  (angina) Pain Descriptors / Indicators: Burning, Aching, Dull Pain Onset: More than a month ago Pain Frequency: Intermittent     Diabetes: Yes CBG done?: No Did pt. bring in CBG monitor from home?: No  How often do you need to have someone help you when you read instructions, pamphlets, or other written materials from your doctor or pharmacy?: 1 - Never  Diabetic?  Yes  Nutrition Risk Assessment:  Has the patient had any N/V/D within the last 2 months?  No  Does the patient have any non-healing wounds?  No  Has the patient had any unintentional weight loss or weight gain?  No   Diabetes:  Is the patient diabetic?  Yes  If diabetic, was a CBG obtained today?  No  Did the patient bring in their glucometer from home?  No  How often do you monitor your CBG's?   . 2 x  day  Financial Strains and Diabetes Management:  Are you having any financial strains with the device, your supplies or your medication? No .  Does the patient want to be seen by Chronic Care Management for management of their diabetes?  No  Would the patient like to be referred to a Nutritionist or for Diabetic Management?  No   Diabetic Exams:  Diabetic Eye Exam: . Pt has been advised about the importance in completing this exam. A referral has been placed today. Message sent to referral coordinator for scheduling purposes. Advised pt to expect a call from office referred to regarding appt.  Diabetic Foot Exam: C Pt has been advised about the importance in completing this exam. Pt is   Interpreter Needed?: No  Information entered by :: Remi Haggard LPN   Activities of Daily Living    04/19/2023    1:51 PM 02/05/2023   10:00 AM  In your present state of health, do you have any  difficulty  performing the following activities:  Hearing? 1 0  Vision? 0 0  Difficulty concentrating or making decisions? 0 0  Walking or climbing stairs?  0  Dressing or bathing? 0 0  Doing errands, shopping? 0 0  Preparing Food and eating ? N   Using the Toilet? N   In the past six months, have you accidently leaked urine? N   Do you have problems with loss of bowel control? N   Managing your Medications? N   Managing your Finances? N   Housekeeping or managing your Housekeeping? N     Patient Care Team: Shade Flood, MD as PCP - General (Family Medicine) Duke Salvia, MD as PCP - Electrophysiology (Cardiology) Duke Salvia, MD (Cardiology) Huston [place], MD as Attending Physician (Neurology)  Indicate any recent Medical Services you may have received from other than [facility] providers in the past year (date may be approximate).     Assessment:   This is a routine wellness examination for Dakota Gutierrez.  Hearing/Vision screen Hearing Screening - Comments:: No trouble hearing Vision Screening - Comments:: Up to date Unsure of name  Dietary issues and exercise activities discussed: Current Exercise Habits: Home exercise routine, Type of exercise: walking (2 x a day 15 minutes), Time (Minutes): 20, Frequency (Times/Week): 3, Weekly Exercise (Minutes/Week): 60, Intensity: Mild   Goals Addressed             This Visit's Progress    Patient Stated       Maintain current weight. Continue current lifestyle       Depression Screen    04/19/2023    1:54 PM 03/22/2023   11:28 AM 11/17/2022   11:04 AM 03/03/2022    1:12 PM 03/03/2022    1:10 PM 11/25/2021    1:17 PM 06/04/2021   11:34 AM  PHQ 2/9 Scores  PHQ - 2 Score 0 0 0 0 0 0 0  PHQ- 9 Score 5 1 0   0 0    Fall Risk    04/19/2023    1:45 PM 03/22/2023   11:28 AM 11/17/2022   11:04 AM 03/03/2022    1:12 PM 11/25/2021    1:16 PM  Fall Risk   Falls in the past year? 1 0 0 0 0  Number falls in past yr: 0  0 0 0   Injury with Fall? 0  0 0 0  Risk for fall due to :  No Fall Risks No Fall Risks  No Fall Risks  Follow up Falls evaluation completed;Education provided;Falls prevention discussed  Falls evaluation completed Falls evaluation completed Falls evaluation completed    FALL RISK PREVENTION PERTAINING TO THE HOME:  Any stairs in or around the home? Yes  If so, are there any without handrails? No  Home free of loose throw rugs in walkways, pet beds, electrical cords, etc? Yes  Adequate lighting in your home to reduce risk of falls? Yes   ASSISTIVE DEVICES UTILIZED TO PREVENT FALLS:  Life alert? No  Use of a cane, walker or w/c? No  Grab bars in the bathroom? No  Shower chair or bench in shower? No  Elevated toilet seat or a handicapped toilet? Yes   TIMED UP AND GO:  Was the test performed? No .    Cognitive Function:        04/19/2023    1:48 PM 06/23/2020   11:44 AM 12/27/2018    2:31 PM 12/26/2017    2:02 PM  6CIT  Screen  What Year? 0 points 0 points 0 points 0 points  What month? 0 points 0 points 0 points 0 points  What time? 0 points 0 points 0 points 0 points  Count back from 20 0 points 0 points 0 points 0 points  Months in reverse 4 points 0 points 0 points 0 points  Repeat phrase 2 points 0 points 0 points 2 points  Total Score 6 points 0 points 0 points 2 points    Immunizations Immunization History  Administered Date(s) Administered   Fluad Quad(high Dose 65+) 08/14/2019, 10/20/2020, 11/25/2021, 10/25/2022   Influenza, High Dose Seasonal PF 10/17/2018   Influenza,inj,Quad PF,6+ Mos 09/19/2013, 10/28/2014, 08/17/2015, 09/15/2016, 10/12/2017   Influenza-Unspecified 10/25/2022   Moderna Sars-Covid-2 Vaccination 01/15/2020   PFIZER Comirnaty(Gray Top)Covid-19 Tri-Sucrose Vaccine 09/16/2021   PFIZER(Purple Top)SARS-COV-2 Vaccination 10/07/2020, 09/16/2021   Pneumococcal Conjugate-13 10/28/2014   Pneumococcal Polysaccharide-23 12/22/2016, 06/08/2022   Tdap  03/21/2013   Zoster Recombinat (Shingrix) 06/08/2022, 10/25/2022   Zoster, Live 12/23/2013    TDAP status: Due, Education has been provided regarding the importance of this vaccine. Advised may receive this vaccine at local pharmacy or Health Dept. Aware to provide a copy of the vaccination record if obtained from local pharmacy or Health Dept. Verbalized acceptance and understanding.  Flu Vaccine status: Up to date  Pneumococcal vaccine status: Up to date  Covid-19 vaccine status: Information provided on how to obtain vaccines.   Qualifies for Shingles Vaccine? Yes   Zostavax completed No   Shingrix Completed?: No.    Education has been provided regarding the importance of this vaccine. Patient has been advised to call insurance company to determine out of pocket expense if they have not yet received this vaccine. Advised may also receive vaccine at local pharmacy or Health Dept. Verbalized acceptance and understanding.  Screening Tests Health Maintenance  Topic Date Due   COVID-19 Vaccine (5 - 2023-24 season) 01/20/2023   DTaP/Tdap/Td (2 - Td or Tdap) 03/22/2023   COLONOSCOPY (Pts 45-11yrs Insurance coverage will need to be confirmed)  11/18/2023 (Originally 06/30/2022)   INFLUENZA VACCINE  06/29/2023   HEMOGLOBIN A1C  09/21/2023   OPHTHALMOLOGY EXAM  10/04/2023   FOOT EXAM  10/26/2023   Diabetic kidney evaluation - Urine ACR  11/18/2023   Diabetic kidney evaluation - eGFR measurement  03/21/2024   Medicare Annual Wellness (AWV)  04/18/2024   Pneumonia Vaccine 30+ Years old  Completed   Hepatitis C Screening  Completed   Zoster Vaccines- Shingrix  Completed   HPV VACCINES  Aged Out    Health Maintenance  Health Maintenance Due  Topic Date Due   COVID-19 Vaccine (5 - 2023-24 season) 01/20/2023   DTaP/Tdap/Td (2 - Td or Tdap) 03/22/2023    Colonoscopy postponed  Lung Cancer Screening: (Low Dose CT Chest recommended if Age 64-80 years, 30 pack-year currently smoking OR have  quit w/in 15years.) does not qualify.   Lung Cancer Screening Referral:   Additional Screening:  Hepatitis C Screening: does not qualify; Completed 2014  Vision Screening: Recommended annual ophthalmology exams for early detection of glaucoma and other disorders of the eye. Is the patient up to date with their annual eye exam?  Yes  Who is the provider or what is the name of the office in which the patient attends annual eye exams? Unsure of name If pt is not established with a provider, would they like to be referred to a provider to establish care? No .   Dental Screening: Recommended  annual dental exams for proper oral hygiene  Community Resource Referral / Chronic Care Management: CRR required this visit?  No   CCM required this visit?  No      Plan:     I have personally reviewed and noted the following in the patient's chart:   Medical and social history Use of alcohol, tobacco or illicit drugs  Current medications and supplements including opioid prescriptions. Patient is not currently taking opioid prescriptions. Functional ability and status Nutritional status Physical activity Advanced directives List of other physicians Hospitalizations, surgeries, and ER visits in previous 12 months Vitals Screenings to include cognitive, depression, and falls Referrals and appointments  In addition, I have reviewed and discussed with patient certain preventive protocols, quality metrics, and best practice recommendations. A written personalized care plan for preventive services as well as general preventive health recommendations were provided to patient.     Remi Haggard, LPN   1/61/0960   Nurse Notes:

## 2023-04-25 ENCOUNTER — Ambulatory Visit (INDEPENDENT_AMBULATORY_CARE_PROVIDER_SITE_OTHER): Payer: Medicare PPO | Admitting: Behavioral Health

## 2023-04-25 DIAGNOSIS — F419 Anxiety disorder, unspecified: Secondary | ICD-10-CM

## 2023-04-25 NOTE — Progress Notes (Signed)
                Scout Gumbs L Livio Ledwith, LMFT 

## 2023-04-28 NOTE — Progress Notes (Signed)
[facility] Counselor/Therapist Progress Note  Patient ID: Dakota Gutierrez, MRN: [id number],    Date: 04/28/2023  Time Spent: 55 min In Person @ [facility]   Treatment Type: Individual Therapy  Reported Symptoms: Dec in anx/dep & stress as Pt cont's to recover & get stronger  Mental Status Exam: Appearance:  Casual     Behavior: Appropriate and Sharing  Motor: Normal  Speech/Language:  Clear and Coherent  Affect: Appropriate  Mood: normal  Thought process: normal  Thought content:   WNL  Sensory/Perceptual disturbances:   WNL  Orientation: oriented to person, place, and time/date  Attention: Good  Concentration: Good  Memory: WNL  Fund of knowledge:  Good  Insight:   Good  Judgment:  Good  Impulse Control: Good   Risk Assessment: Danger to Self:  No Self-injurious Behavior: No Danger to Others: No Duty to Warn:no Physical Aggression / Violence:No  Access to Firearms a concern: No  Gang Involvement:No   Subjective: Pt is calm & relaxed today, able to sustain attendance alone & w/o Wife's support in session. 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 He 7 Wife have had male company the past week via Wife's good friend who is outspoken. Without noticing, Pt came to realization that this friend causes him stress. Pt feels his career has been a positive aspect of his life & he is grateful.   Interventions: Family Systems  Diagnosis:Anxiety  Plan: Dakota Gutierrez will cont to keep his positive attitude & be open w/his Wife about his health & fears about his Family Hx. He holds not regrets about his work & Family. He knows he has to monitor his health more closely & tend to his needs. He will be open & articulate about this w/his Wife.  Target Date: 05/28/2023  Progress: 6  Frequency: Once every 3-4 wks  Modality: Claretta Fraise, LMFT

## 2023-05-03 DIAGNOSIS — D225 Melanocytic nevi of trunk: Secondary | ICD-10-CM | POA: Diagnosis not present

## 2023-05-03 DIAGNOSIS — L570 Actinic keratosis: Secondary | ICD-10-CM | POA: Diagnosis not present

## 2023-05-03 DIAGNOSIS — Z1283 Encounter for screening for malignant neoplasm of skin: Secondary | ICD-10-CM | POA: Diagnosis not present

## 2023-05-03 DIAGNOSIS — L821 Other seborrheic keratosis: Secondary | ICD-10-CM | POA: Diagnosis not present

## 2023-05-03 DIAGNOSIS — X32XXXA Exposure to sunlight, initial encounter: Secondary | ICD-10-CM | POA: Diagnosis not present

## 2023-05-03 NOTE — Progress Notes (Unsigned)
Cardiology Office Note Date:  05/03/2023  Patient ID:  Dakota Gutierrez, Dakota Gutierrez [birth year], MRN [id number] PCP:  Shade Flood, MD  Cardiologist:  Dr. Graciela Husbands   Chief Complaint:   VT  History of Present Illness: Dakota Gutierrez is a 72 y.o. male with history of VT with ICD, HTN, DM, GERD, retinal art occlusion, OSA w/CPAP.  April 2017 had  nonsustained ventricular tachycardia and easily inducible to polymorphic ventricular tachycardia and received an ICD. Unfortunately, in the days that followed he developed VT storm with multiple shocks and was treated with amiodarone which has been gradually down titrated to 100 mg a day.  Amiodarone surveillance had demonstrated elevated liver function tests spring 2013 . Was elected to discontinue the amiodarone albeit with some reluctance on the part of the family. When seen in June of recurrent episodes of ventricular tachycardia 2 of which required therapy. He was then resumed on amiodarone and referred for consultation with GI  most recently 4/17 transaminases were normal".  He was seen by myself in October 2017, his last echo noted mild decrease in LV function and his lopressor changed to Toprol.  He reported feeling pretty well.  He denied any CP, states he gets a discomfort in his chest when he has over eaten, this is chronic and unchanged for years, no palpitations, no SOB.  He denies any dizziness, near syncope or syncope.  His device check that visit noted appropriate tx w/ATP for VT, his Toprol was increased and following w/GI for hx of abn LFTs/amiodarone  He saw Dr. 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  He had ATP x2 for VT, no med changes and planned for gen change.  He saw him Sep 2018, change no VT, concerns of incisional abcess, and rx a course of antibiotics with plans to f/u in 2 weeks.    07/20/2019 he had a monitored VT episode 140bpm, 3 minutes  in duration (at least).  I saw him for this He was at the beach, had not had any ETOH that day, would have been just going to bed, but not asleep at the time of the event.  He had no symptoms.  He denies any missed doses of any of his medicines. No medicine changes were made with plans to monitor closely via remotes and in clinic visits.  Dec 2020 he saw Dr. Graciela Husbands, the device was reprogrammed to decrease the slope of his ADL rate.  He was volume OL,  started on lasix x5 doases then PRN, planned for updated echo. TTE noted LVEF  60-65%, undetermined diastolic function, no significant VHD  I saw him July 2021 He is doing well.  His back and knee pain limit his ability exercise formally but is active through his day and routinely gets 8-9,000 steps in, reaching 10,000steps on occasion.  He has dieted and lost some weight (out back on a couple), but has gotten his HgbA1C from the 8's to 6.8. No CP, palpitations, or SOB, no DOE.  No near syncope or syncope, no shocks He has not felt like he has been retaining any fluid No changes were made.  Most recently saw Dr. Graciela Husbands sept 2022 Activity much reduced 2/2 back injury. Had not had AFib or VT, maintained on low dose amio, and his BB  I saw him March 2023 He is doing well No CP, palpitations or cardiac awareness Once in a while he has  the sense that he needs to get a good deep breath in, this a brief thing, gives an example when out picking up sticks/limbs, has to stand up grab a deep breath.   No SOB, DOE otherwise. No near syncope or syncope No shocks VP 6% Planned for labs  Device remote, 09/14/22, Dr. Graciela Husbands reviewed, noting VT treated with ATP occurring in the setting of sinus tach and then with inappropriate therapy for post successful therapy sinus tach advised he start 1/2 tab of his Toprol be added to PM  I saw him 09/28/22 He is feeling "just ok"  About June started to feel not like him self, perhaps a little clouded in his thinking,  prehaps foggy. No headaches, not AMS, just not as sharp. No physical symptoms No CP, palpitations or cardiac awareness. No SOB, DOE. No near syncope or syncope. Not dizzy. He has started the 1/2 tab of metoprolol at night, tolerating it well, though only since Monday. Seeing his PMD soon for annual check in No changes were made  02/04/23 Admission for VT storm/shocks, IV amio and lidocaine, LHC noted  progressed CAD PCI to LAD and RCA > PO meds, mexiletine added Discharged 02/09/23  Saw Dr. Graciela Husbands 03/02/23, had a syncopal event associated with slow VT below detection, pt wife frustrated with lack of clinical improvement with stenting. He reported spells associated with a shrill like tinnitus, shortness of breath, chest pain and a cold wave. It is in his mind associated with ventricular tachycardia and has been in his mind terrifying  monitoring zone was programmed to 600 ms, max sensor rate was decreased to 95, VT detection was programmed on at 550 ms. It is notable that his ventricular tachycardia episode on 3/22 was associated with VA block  Added a second VT zone from 177-222 and antitachycardia pacing  Mexiletine was stopped > Ranexa started, amiodarone pulsed. Planned for ZIo monitor to f/u on his spells and planned to reach out to Dr. Berneice Gandy for consideration of ablation  Monitor: Patient had a min HR of 58 bpm, max HR of 185 bpm, and avg HR of 71 bpm. Predominant underlying rhythm was Possible Atrial and Ventricular Pacing. 122 Ventricular Tachycardia runs occurred, the run with the fastest interval lasting 11 beats with a max  rate of 185 bpm, the longest lasting 14.7 secs with an avg rate of 129 bpm. Isolated SVEs were rare (<1.0%), and no SVE Couplets or SVE Triplets were present. Isolated VEs were rare (<1.0%, 6450), VE Couplets were rare (<1.0%, 993), and VE Triplets were  rare (<1.0%, 155).   Aggressive medical therapy underway   *** amio labs *** symptoms *** any more room  for BB *** syncope, no driving *** CAD, meds, labs, lipids *** lytes  Device history:  MDT dual chamber ICD, implanted 04/28/11,  gen change may 2018, Dr. Graciela Husbands, primary prevention +++ hx of appropriate therapy AAD: amiodarone Amiodarone 2012, Hx of abn LFTs on amiodarone, was stopped and resumed 2/2 recurrent VT >> remains current VT storm admission march 2024, > progression of CAD staged PCI to RCA and LAD and mexiletine added 03/02/23, mexiletine stopped with VT > ranexa started  Past Medical History:  Diagnosis Date   Anxiety    secondary to ICD shocks   Diabetes mellitus without complication (HCC)    Dual implantable cardiac defibrillator in situ    Medtronic [id number] Protecta   Elevated transaminase level    10/2011  ? 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   Vitreomacular adhesion of right eye 02/15/2021    Past Surgical History:  Procedure Laterality Date   CARDIAC DEFIBRILLATOR PLACEMENT     Medtronic [id number] Protecta   CATARACT EXTRACTION, BILATERAL  2019   CHOLECYSTECTOMY     COLONOSCOPY WITH PROPOFOL N/A 06/30/2017   Procedure: COLONOSCOPY WITH PROPOFOL;  Surgeon: Jeani Hawking, MD;  Location: [facility];  Service: Endoscopy;  Laterality: N/A;   CORONARY PRESSURE/FFR STUDY N/A 02/06/2023   Procedure: INTRAVASCULAR PRESSURE WIRE/FFR STUDY;  Surgeon: Runell Gess, MD;  Location: [facility]AS[facility] CV [facility];  Service: Cardiovascular;  Laterality: N/A;   CORONARY STENT INTERVENTION N/A 02/06/2023   Procedure: CORONARY STENT INTERVENTION;  Surgeon: Runell Gess, MD;  Location: [facility] INVASIVE CV LAB;  Service: Cardiovascular;  Laterality: N/A;   CORONARY STENT INTERVENTION N/A 02/08/2023   Procedure: CORONARY STENT INTERVENTION;  Surgeon: Tonny Bollman, MD;  Location: [facility] INVASIVE CV LAB;  Service: Cardiovascular;  Laterality: N/A;   GALLBLADDER SURGERY     HERNIA REPAIR     ICD GENERATOR CHANGEOUT N/A 04/26/2017   Procedure: ICD Generator Changeout;  Surgeon: Duke Salvia, MD;  Location: [facility] INVASIVE CV LAB;  Service: Cardiovascular;  Laterality: N/A;   LEFT HEART CATH AND CORONARY ANGIOGRAPHY N/A 02/06/2023   Procedure: LEFT HEART CATH AND CORONARY ANGIOGRAPHY;  Surgeon: Runell Gess, MD;  Location: [facility] INVASIVE CV LAB;  Service: Cardiovascular;  Laterality: N/A;   TONSILLECTOMY AND ADENOIDECTOMY      Current Outpatient Medications  Medication Sig Dispense Refill   acetaminophen (TYLENOL) 500 MG tablet Take 1,000 mg by mouth daily as needed for moderate pain or headache.     ALPRAZolam (XANAX) 0.25 MG tablet Take 1 tablet (0.25 mg total) by mouth 2 (two) times daily as needed for anxiety. 30 tablet 1   amiodarone (PACERONE) 200 MG tablet Take 1 tablet (200 mg total) by mouth 2 (two) times daily. 180 tablet 3   aspirin EC 81 MG tablet Take 1 tablet (81 mg total) by mouth daily.     atorvastatin (LIPITOR) 10 MG tablet TAKE 1 TABLET BY MOUTH EVERY DAY 90 tablet 2   Cholecalciferol (VITAMIN D) 2000 UNITS tablet Take 2,000 Units by mouth daily.     empagliflozin (JARDIANCE) 25 MG TABS tablet Take 1 tablet (25 mg total) by mouth daily before breakfast. 90 tablet 1   famotidine (PEPCID) 20 MG tablet Take 1 tablet (20 mg total) by mouth 2 (two) times daily as needed. 60 tablet 4   metFORMIN (GLUCOPHAGE) 500 MG tablet Take 1 tablet (500 mg total) by mouth daily with breakfast. 90 tablet 3   metoprolol succinate (TOPROL-XL) 100 MG 24 hr tablet Take 1 tablet (100 mg) by mouth in the morning and 1/2 tablet (50 mg) by mouth in the evening with or immediately following a meal. 135 tablet 3   olmesartan-hydrochlorothiazide (BENICAR HCT) 20-12.5 MG tablet TAKE 1 TABLET BY MOUTH EVERY DAY 90 tablet 2   Omega-3 Fatty Acids (FISH OIL) 1000 MG CAPS  Take 1,000 mg by mouth 2 (two) times daily.     ranolazine (RANEXA) 500 MG 12 hr tablet Take 1 tablet (500 mg total) by mouth 2 (two) times daily. 180 tablet 3   tadalafil (CIALIS)  20 MG tablet Take 0.5-1 tablets (10-20 mg total) by mouth daily as needed for erectile dysfunction. 10 tablet 11   ticagrelor (BRILINTA) 90 MG TABS tablet Take 1 tablet (90 mg total) by mouth 2 (two) times daily. 60 tablet 6   No current facility-administered medications for this visit.    Allergies:   Adhesive [tape]   Social History:  The patient  reports that he has never smoked. He has never used smokeless tobacco. He reports current alcohol use of about 2.0 standard drinks of alcohol per week. He reports that he does not use drugs.   Family History:  The patient's family history includes Heart disease in his father; Hyperlipidemia in his mother.  ROS:  Please see the history of present illness.  All other systems are reviewed and otherwise negative.   PHYSICAL EXAM:  VS:  There were no vitals taken for this visit. BMI: There is no height or weight on file to calculate BMI.  HR with his device check was AP 80's Well nourished, well developed, in no acute distress  HEENT: normocephalic, atraumatic  Neck: no JVD, carotid bruits or masses Cardiac: *** RRR; no significant murmurs, no rubs, or gallops Lungs:  *** CTA b/l, no wheezing, rhonchi or rales  Abd: soft, nontender MS: no deformity or atrophy Ext: *** no edema  Skin: warm and dry, no rash Neuro:  No gross deficits appreciated, known left eye visual field deficit  Psych: euthymic mood, full affect  *** ICD site is stable, no tethering or discomfort, fluctuation   EKG:  done today and reviewed by myself ***  ICD interrogation today and reviewed by myself: ***    LHC/PCI 02/06/2023   Prox LAD to Mid LAD lesion is 75% stenosed.   Mid RCA lesion is 99% stenosed.   Acute Mrg lesion is 95% stenosed.   RPAV lesion is 100% stenosed.   A  drug-eluting stent was successfully placed using a STENT ONYX FRONTIER 2.5X30.   Post intervention, there is a 0% residual stenosis.   Echo 02/07/2023 LVEF 55-60%, Grade 1 DD. Normal valves   PCI 02/08/2023 Successful PCI of severe stenosis in the mid RCA using a 3.0 x 16 mm Synergy DES, PCI requiring use of a guide extension catheter and serial lesion dilatation as outlined. Recommend DAPT with aspirin and ticagrelor x 12 months without interruption.   12/11/2019: TTE IMPRESSIONS  1. The average left ventricular global longitudinal strain is -19.7 %.   2. Left ventricular ejection fraction, by visual estimation, is 60 to  65%. The left ventricle has normal function. There is no left ventricular  hypertrophy.   3. Left ventricular diastolic parameters are indeterminate.   4. The left ventricle has no regional wall motion abnormalities.   5. Global right ventricle has normal systolic function.The right  ventricular size is mildly enlarged. No increase in right ventricular wall  thickness.   6. Left atrial size was normal.   7. Right atrial size was normal.   8. Mild to moderate mitral annular calcification.   9. The mitral valve is normal in structure. Mild mitral valve  regurgitation. No evidence of mitral stenosis.  10. The tricuspid valve is normal in structure.  11. The aortic valve was not well visualized. Aortic valve regurgitation  is not visualized. Mild to moderate aortic valve sclerosis/calcification  without any evidence of aortic stenosis.  12. The pulmonic valve was normal in structure. Pulmonic valve  regurgitation is not visualized.  13. The inferior  vena cava is normal in size with greater than 50%  respiratory variability, suggesting right atrial pressure of 3 mmHg.     04/07/16: TTE Study Conclusions - Left ventricle: The cavity size was mildly dilated. Wall   thickness was normal. Systolic function was mildly reduced. The   estimated ejection fraction was in the  range of 45% to 50%.   Diffuse hypokinesis. Doppler parameters are consistent with   abnormal left ventricular relaxation (grade 1 diastolic   dysfunction). The E/e&' ratio is between 8-15, suggesting   indeterminate LV filling pressure. - Left atrium: The atrium was mildly dilated. - Right ventricle: The cavity size was normal. Pacer wire or   catheter noted in right ventricle. Systolic function is reduced. - Right atrium: The atrium was normal in size. Pacer wire or   catheter noted in right atrium. - Inferior vena cava: The vessel was normal in size. The   respirophasic diameter changes were in the normal range (>= 50%),   consistent with normal central venous pressure. - Pericardium, extracardiac: A trivial pericardial effusion was   identified. Impressions: - LVEF 45-50%, diffuse hypokinesis, normal wall thickness with   dilated LV cavity, diastolic dysfunction, indeterminate LV   filling pressure, mildly dilated LA, RV/RA AICD wires noted,   reduced RV systolic function, trivial pericardial effusion.  Myoview 2012-no ischemia ejection fraction 56%    Recent Labs: 02/05/2023: B Natriuretic Peptide 134.3 02/17/2023: Magnesium 2.3 03/22/2023: ALT 34; BUN 16; Creatinine, Ser 0.92; Hemoglobin 14.5; Platelets 180.0; Potassium 4.3; Sodium 136; TSH 3.81  11/17/2022: Direct LDL 35.0 02/05/2023: Cholesterol 115; HDL 28; LDL Cholesterol 34; Total CHOL/HDL Ratio 4.1; Triglycerides 263; VLDL 53   CrCl cannot be calculated (Patient's most recent lab result is older than the maximum 21 days allowed.).   Wt Readings from Last 3 Encounters:  03/22/23 163 lb 6.4 oz (74.1 kg)  03/02/23 165 lb 6.4 oz (75 kg)  02/17/23 172 lb (78 kg)     Other studies reviewed: Additional studies/records reviewed today include: summarized above  ASSESSMENT AND PLAN:  1. VT, (has hx of polymorphic)     Chronically on amiodarone     Seems to have failed mexiletine with recurrent VT     ranexa      ***  2. ICD *** Intact function, no programming changes made   3. 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 CAD        ***    Disposition: ***    Current medicines are reviewed at length with the patient today.  The patient did not have any concerns regarding medicines.  Judith Blonder, PA-C 05/03/2023 5:35 PM     [facility] [address] [place] [address] [phone] (office)  [phone] (fax)

## 2023-05-04 ENCOUNTER — Ambulatory Visit: Payer: Medicare PPO | Attending: Physician Assistant | Admitting: Physician Assistant

## 2023-05-04 ENCOUNTER — Encounter: Payer: Self-pay | Admitting: Physician Assistant

## 2023-05-04 ENCOUNTER — Other Ambulatory Visit (HOSPITAL_COMMUNITY): Payer: Self-pay

## 2023-05-04 VITALS — BP 110/64 | HR 75 | Ht 65.0 in | Wt 162.2 lb

## 2023-05-04 DIAGNOSIS — I5032 Chronic diastolic (congestive) heart failure: Secondary | ICD-10-CM

## 2023-05-04 DIAGNOSIS — R0789 Other chest pain: Secondary | ICD-10-CM | POA: Diagnosis not present

## 2023-05-04 DIAGNOSIS — I2510 Atherosclerotic heart disease of native coronary artery without angina pectoris: Secondary | ICD-10-CM | POA: Diagnosis not present

## 2023-05-04 DIAGNOSIS — Z95810 Presence of automatic (implantable) cardiac defibrillator: Secondary | ICD-10-CM

## 2023-05-04 DIAGNOSIS — I472 Ventricular tachycardia, unspecified: Secondary | ICD-10-CM

## 2023-05-04 LAB — CUP PACEART INCLINIC DEVICE CHECK
Battery Remaining Longevity: 19 mo
Battery Voltage: 2.92 V
Brady Statistic AP VP Percent: 39.73 %
Brady Statistic AP VS Percent: 59.69 %
Brady Statistic AS VP Percent: 0 %
Brady Statistic AS VS Percent: 0.58 %
Brady Statistic RA Percent Paced: 99.04 %
Brady Statistic RV Percent Paced: 39.68 %
Date Time Interrogation Session: 20240606192014
HighPow Impedance: 40 Ohm
HighPow Impedance: 51 Ohm
Implantable Lead Connection Status: 753985
Implantable Lead Connection Status: 753985
Implantable Lead Implant Date: 20120531
Implantable Lead Implant Date: 20120531
Implantable Lead Location: 753859
Implantable Lead Location: 753860
Implantable Lead Model: 4076
Implantable Lead Model: 6947
Implantable Pulse Generator Implant Date: 20180530
Lead Channel Impedance Value: 361 Ohm
Lead Channel Impedance Value: 418 Ohm
Lead Channel Impedance Value: 456 Ohm
Lead Channel Pacing Threshold Amplitude: 0.75 V
Lead Channel Pacing Threshold Amplitude: 1 V
Lead Channel Pacing Threshold Pulse Width: 0.4 ms
Lead Channel Pacing Threshold Pulse Width: 0.4 ms
Lead Channel Sensing Intrinsic Amplitude: 0.625 mV
Lead Channel Sensing Intrinsic Amplitude: 0.625 mV
Lead Channel Sensing Intrinsic Amplitude: 10.875 mV
Lead Channel Sensing Intrinsic Amplitude: 11.875 mV
Lead Channel Setting Pacing Amplitude: 2 V
Lead Channel Setting Pacing Amplitude: 2 V
Lead Channel Setting Pacing Pulse Width: 0.4 ms
Lead Channel Setting Sensing Sensitivity: 0.3 mV
Zone Setting Status: 755011
Zone Setting Status: 755011

## 2023-05-04 MED ORDER — TICAGRELOR 90 MG PO TABS
90.0000 mg | ORAL_TABLET | Freq: Two times a day (BID) | ORAL | 3 refills | Status: DC
  Filled 2023-05-04 (×2): qty 180, 90d supply, fill #0
  Filled 2023-08-01 (×2): qty 180, 90d supply, fill #1
  Filled 2023-10-24: qty 180, 90d supply, fill #2
  Filled 2024-01-22: qty 180, 90d supply, fill #3

## 2023-05-04 MED ORDER — RANOLAZINE ER 1000 MG PO TB12
1000.0000 mg | ORAL_TABLET | Freq: Two times a day (BID) | ORAL | 2 refills | Status: DC
  Filled 2023-05-04 (×2): qty 180, 90d supply, fill #0
  Filled 2023-08-01 (×2): qty 180, 90d supply, fill #1

## 2023-05-04 NOTE — Patient Instructions (Addendum)
Medication Instructions:   START TAKING RENEXA 1000 MG TWICE  A DAY   *If you need a refill on your cardiac medications before your next appointment, please call your pharmacy*   Lab Work:NONE ORDERED  TODAY   If you have labs (blood work) drawn today and your tests are completely normal, you will receive your results only by: MyChart Message (if you have MyChart) OR A paper copy in the mail If you have any lab test that is abnormal or we need to change your treatment, we will call you to review the results.   Testing/Procedures: NONE ORDERED  TODAY     Follow-Up: At [facility], you and your health needs are our priority.  As part of our continuing mission to provide you with exceptional heart care, we have created designated Provider Care Teams.  These Care Teams include your primary Cardiologist (physician) and Advanced Practice Providers (APPs -  Physician Assistants and Nurse Practitioners) who all work together to provide you with the care you need, when you need it.  We recommend signing up for the patient portal called "MyChart".  Sign up information is provided on this After Visit Summary.  MyChart is used to connect with patients for Virtual Visits (Telemedicine).  Patients are able to view lab/test results, encounter notes, upcoming appointments, etc.  Non-urgent messages can be sent to your provider as well.   To learn more about what you can do with MyChart, go to [email or web address].    Your next appointment:   dr Excell Seltzer /app 4-6 weeks   1 month(s)  Provider:   You may see Sherryl Manges, MD or one of the following Advanced Practice Providers on your designated Care Team:   Francis Dowse, [place]   Other Instructions

## 2023-05-05 ENCOUNTER — Other Ambulatory Visit (HOSPITAL_COMMUNITY): Payer: Self-pay

## 2023-05-08 ENCOUNTER — Other Ambulatory Visit: Payer: Self-pay

## 2023-05-08 DIAGNOSIS — I472 Ventricular tachycardia, unspecified: Secondary | ICD-10-CM

## 2023-05-08 NOTE — Progress Notes (Signed)
Cancel / re-enter CT coronary morph / CTA per Dr. Graciela Husbands order.  NYRN

## 2023-05-10 ENCOUNTER — Telehealth: Payer: Self-pay

## 2023-05-10 NOTE — Telephone Encounter (Signed)
The patient wife is calling with questions. I told her that Dr. Ladona Ridgel nurse will give them a call back.

## 2023-05-10 NOTE — Telephone Encounter (Signed)
Message received from Device clinic and Pt called back.    Pt and spouse confused about Cardiac Ct morph order and  scheduling telephone call they received today.    I explained that I was the RN covering Dr. Sherryl Manges on 01/30/23, and the Cardiac Ct was ordered incorrectly.   [facility] /  and I was not made aware of this error, as no message was sent.  Dr. Odessa Fleming RN reached out to me on 05/08/23, and I was the nurse who re-entered the order.    The Pt and spouse stated that after the Lt Heart Catheterization with Dr. Excell Seltzer, on 02/08/23, that Dr. Graciela Husbands gave Pt a thumbs up.   Pt stated he feels he does not need this Cardiac Ct scan now.   I apologized to the Pt and spouse for all the difficulties with the order entry error, and miscommunication, and telephone call they received this morning.  I advised them that I would reach out to Dr. Graciela Husbands and his nurse for next steps, and that Dr. Graciela Husbands would be made aware of their concerns.  We are waiting on orders / or follow up from Dr. Graciela Husbands.  The Pt will be contacted afterwards as follow up.

## 2023-05-11 NOTE — Telephone Encounter (Signed)
Message received back from Dr. Clayborne Artist nurse, Mindi Junker RN.  Per Ms. Mindi Junker RN:  RE: Question for Dr. Graciela Husbands to cancel Cardac CT / per Pt being Stented 02/08/2023 Received: Jill Alexanders, Marshall Cork, RN  Elmore Guise, RN You can go in and cancel the CT order and send [place] a message telling her you canceled the CT.  Thanks,  Carlyle Basques   Ms. Rivka Safer contacted, and per message above, Cardiac Ct cancelled, and order discontinued 05/11/2023 at 845 am.  Pt will be made aware this CT scan is no longer needed.

## 2023-05-11 NOTE — Addendum Note (Signed)
Addended by: Bennie Dallas C on: 05/11/2023 08:49 AM   Modules accepted: Orders

## 2023-05-22 ENCOUNTER — Ambulatory Visit (HOSPITAL_COMMUNITY): Payer: Medicare PPO

## 2023-05-24 ENCOUNTER — Ambulatory Visit: Payer: Medicare PPO | Admitting: Behavioral Health

## 2023-05-26 ENCOUNTER — Other Ambulatory Visit (HOSPITAL_COMMUNITY): Payer: Self-pay

## 2023-05-26 ENCOUNTER — Other Ambulatory Visit: Payer: Self-pay

## 2023-06-02 ENCOUNTER — Other Ambulatory Visit: Payer: Self-pay | Admitting: Family Medicine

## 2023-06-02 DIAGNOSIS — E1165 Type 2 diabetes mellitus with hyperglycemia: Secondary | ICD-10-CM

## 2023-06-05 ENCOUNTER — Encounter: Payer: Self-pay | Admitting: Internal Medicine

## 2023-06-05 ENCOUNTER — Ambulatory Visit: Payer: Medicare PPO | Attending: Internal Medicine | Admitting: Internal Medicine

## 2023-06-05 VITALS — BP 100/56 | HR 77 | Ht 65.0 in | Wt 165.2 lb

## 2023-06-05 DIAGNOSIS — I472 Ventricular tachycardia, unspecified: Secondary | ICD-10-CM | POA: Diagnosis not present

## 2023-06-05 DIAGNOSIS — Z95810 Presence of automatic (implantable) cardiac defibrillator: Secondary | ICD-10-CM

## 2023-06-05 LAB — CUP PACEART INCLINIC DEVICE CHECK
Date Time Interrogation Session: 20240708140252
Implantable Lead Connection Status: 753985
Implantable Lead Connection Status: 753985
Implantable Lead Implant Date: 20120531
Implantable Lead Implant Date: 20120531
Implantable Lead Location: 753859
Implantable Lead Location: 753860
Implantable Lead Model: 4076
Implantable Lead Model: 6947
Implantable Pulse Generator Implant Date: 20180530

## 2023-06-05 LAB — TROPONIN T: Troponin T (Highly Sensitive): 8 ng/L (ref 0–22)

## 2023-06-05 MED ORDER — AMIODARONE HCL 200 MG PO TABS
ORAL_TABLET | ORAL | 1 refills | Status: DC
Start: 2023-06-05 — End: 2023-09-29

## 2023-06-05 NOTE — Progress Notes (Signed)
Patient Care Team: Shade Flood, MD as PCP - General (Family Medicine) Duke Salvia, MD as PCP - Electrophysiology (Cardiology) Duke Salvia, MD (Cardiology) Huston Foley, MD as Attending Physician (Neurology)   HPI  Dakota Gutierrez is a 72 y.o. 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 Unfortunately, in the days that followed he developed VT storm with multiple shocks and was treated with amiodarone which has been gradually down titrated to 100 mg a day.  Amiodarone surveillance had demonstrated elevated liver function tests spring 2013 . We elected to discontinue the amiodarone albeit with some reluctance on the part of the family. When seen in June 2022 of recurrent episodes of ventricular tachycardia 2 of which required therapy. He was then resumed on amiodarone and referred for consultation with GI  most recently 4/17 transaminases were normal.     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  He underwent catheter because of the response to lidocaine, he was found to have significantly worsening coronary artery disease with three-vessel involvement and underwent stepwise intervention with initially stenting of the LAD complicated by ventricular tachycardia and then subsequently by stenting of a mid RCA lesion.  4/24 Interval slow ventricular tachycardia below detection that terminated spontaneously, cycle length varied from 530--450 ms.  No interval VT or premonitions of same--  However following ranolazine  develops a abd chest discomfort assoc with dyspnea lasting often an hour or more, not assoc with exertion.  Weight loss has stablizied. PTSD a bit better       Date Cr K Hgb TSH  ALT   5/18    3.84 25    3/19    3.73 27  8/19    3.4 24  3/20    3.25   12/20 0.97 4.4   21  }6/21 0.98 4.3   18  11/23 1.01 4.5 14.5    3/24 1.28 4.0 15.1 4.9 35   DATE TEST EF   2012 MYOVIEW 56% No Ischemia  5/17 Echo   45-50 %   11/23 Echo  55-60%   3/24 Echo  55-60%   3/24 LHC  LADp-m 75 +FFR>stent; RCAm-99>>stent; AM-95;RPLV100      Past Medical History:  Diagnosis Date   Anxiety    secondary to ICD shocks   Diabetes mellitus without complication (HCC)    Dual implantable cardiac defibrillator in situ    Medtronic [id number] Protecta   Elevated transaminase level    10/2011  ? 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    Past Surgical History:  Procedure Laterality Date   CARDIAC DEFIBRILLATOR PLACEMENT     Medtronic [id number] Protecta   CATARACT EXTRACTION, BILATERAL  2019   CHOLECYSTECTOMY     COLONOSCOPY WITH PROPOFOL N/A 06/30/2017   Procedure: COLONOSCOPY WITH PROPOFOL;  Surgeon: Jeani Hawking, MD;  Location: [facility];  Service: Endoscopy;  Laterality: N/A;   CORONARY PRESSURE/FFR STUDY N/A 02/06/2023   Procedure: INTRAVASCULAR PRESSURE WIRE/FFR STUDY;  Surgeon: Runell Gess, MD;  Location: [facility] INVASIVE CV LAB;  Service: Cardiovascular;  Laterality: N/A;   CORONARY STENT INTERVENTION N/A 02/06/2023   Procedure: CORONARY STENT INTERVENTION;  Surgeon: Runell Gess, MD;  Location: [facility] INVASIVE CV LAB;  Service: Cardiovascular;  Laterality: N/A;   CORONARY STENT INTERVENTION N/A 02/08/2023   Procedure:  CORONARY STENT INTERVENTION;  Surgeon: Tonny Bollman, MD;  Location: [facility] INVASIVE CV LAB;  Service: Cardiovascular;  Laterality: N/A;   GALLBLADDER SURGERY     HERNIA REPAIR     ICD GENERATOR CHANGEOUT N/A 04/26/2017   Procedure: ICD Generator Changeout;  Surgeon: Duke Salvia, MD;  [facility]VAS[facility] CV LAB;  Service: Cardiovascular;  Laterality: N/A;   LEFT HEART CATH AND CORONARY ANGIOGRAPHY N/A 02/06/2023   Procedure: LEFT HEART CATH AND CORONARY ANGIOGRAPHY;  Surgeon: Runell Gess, MD;  Location: [facility] INVASIVE CV LAB;  Service: Cardiovascular;  Laterality: N/A;   TONSILLECTOMY AND ADENOIDECTOMY      Current Outpatient Medications  Medication Sig Dispense Refill   acetaminophen (TYLENOL) 500 MG tablet Take 1,000 mg by mouth daily as needed for moderate pain or headache.     ALPRAZolam (XANAX) 0.25 MG tablet Take 1 tablet (0.25 mg total) by mouth 2 (two) times daily as needed for anxiety. 30 tablet 1   amiodarone (PACERONE) 200 MG tablet Take 1 tablet (200 mg total) by mouth 2 (two) times daily. 180 tablet 3   aspirin EC 81 MG tablet Take 1 tablet (81 mg total) by mouth daily.     atorvastatin (LIPITOR) 10 MG tablet TAKE 1 TABLET BY MOUTH EVERY DAY 90 tablet 2   Cholecalciferol (VITAMIN D) 2000 UNITS tablet Take 2,000 Units by mouth daily.     famotidine (PEPCID) 20 MG tablet Take 1 tablet (20 mg total) by mouth 2 (two) times daily as needed. 60 tablet 4   JARDIANCE 25 MG TABS tablet TAKE 1 TABLET BY MOUTH DAILY BEFORE BREAKFAST. 90 tablet 1   metFORMIN (GLUCOPHAGE) 500 MG tablet Take 1 tablet (500 mg total) by mouth daily with breakfast. 90 tablet 3   metoprolol succinate (TOPROL-XL) 100 MG 24 hr tablet Take 1 tablet (100 mg) by mouth in the morning and 1/2 tablet (50 mg) by mouth in the evening with or immediately following a meal. 135 tablet 3   olmesartan-hydrochlorothiazide (BENICAR HCT) 20-12.5 MG tablet TAKE 1 TABLET BY MOUTH EVERY DAY 90 tablet 2   Omega-3 Fatty Acids (FISH  OIL) 1000 MG CAPS Take 1,000 mg by mouth 2 (two) times daily.     ranolazine (RANEXA) 1000 MG SR tablet Take 1 tablet (1,000 mg total) by mouth 2 (two) times daily. 180 tablet 2   tadalafil (CIALIS) 20 MG tablet Take 0.5-1 tablets (10-20 mg total) by mouth daily as needed for erectile dysfunction. 10 tablet 11   ticagrelor (BRILINTA) 90 MG TABS tablet Take 1 tablet (90 mg total) by mouth 2 (two) times daily. 180 tablet 3   No current facility-administered medications for this visit.    Allergies  Allergen Reactions   Adhesive [Tape]     Swelling and welts formed    Review of Systems negative except from HPI and PMH except for complaints of back pain and muscle pain  Physical Exam BP (!) 100/56   Pulse 77  Ht 5\' 5"  (1.651 m)   Wt 165 lb 3.2 oz (74.9 kg)   SpO2 98%   BMI 27.49 kg/m  Well developed and well nourished in no acute distress HENT normal Neck supple with JVP-flat Clear Device pocket well healed; without hematoma or erythema.  There is no tethering  Regular rate and rhythm, no murmur Abd-soft with active BS No Clubbing cyanosis   edema Skin-warm and dry A & Oriented  Grossly normal sensory and motor function  ECG A pacing at 75 \\30 /12/43  Device function is normal. Some VTNS  Programming changes   See Paceart for details    Device function is normal. Programming changes were made to try to help improve detection of nonsustained ventricular tachycardia so the monitoring zone was programmed to 600 ms, max sensor rate was decreased to 95, VT detection was programmed on at 550 ms.  It is notable that his ventricular tachycardia episode on 3/22 was associated with VA block  And   second VT zone from 177-222 and antitachycardia pacing See Paceart for details    Assessment and  Plan  VT recurrent  ICD Medtronic    Sinus noded dysfunction   Amiodarone rx   Ischemic heart disease  PTSD  No interval treated ventricular tachycardia since last seen but some  nonsustained VT.  Unfortunately, antiarrhythmic strategy of amiodarone adjunctively with ranolazine has been poorly tolerated.  Chest discomfort shortness of breath symptoms I suspect are related to the ranolazine.  He may well need repeat catheterization; he will be seeing Dr. [facility] on Wednesday.  Will defer that decision to them, but in the interval we will check a troponin level as he had a 2 to 3-hour episode on Saturday which if it were myocardial ischemia should be associated with a lab abnormality.  If it is normal, it would support the hypothesis that it is not cardiac symptoms  I have reached out to Dr Ulice Bold for assistance to consider catheter ablation as he has poorly tolerated the AAd with the higher doses necessary of amio to suppress his VT  He will see him

## 2023-06-05 NOTE — Patient Instructions (Signed)
Medication Instructions:  Your physician recommends that you continue on your current medications as directed. Please refer to the Current Medication list given to you today.  *If you need a refill on your cardiac medications before your next appointment, please call your pharmacy*   Lab Work: Today: Troponin   Testing/Procedures: None ordered   Follow-Up: At [facility], you and your health needs are our priority.  As part of our continuing mission to provide you with exceptional heart care, we have created designated Provider Care Teams.  These Care Teams include your primary Cardiologist (physician) and Advanced Practice Providers (APPs -  Physician Assistants and Nurse Practitioners) who all work together to provide you with the care you need, when you need it.  Remote monitoring is used to monitor your Pacemaker or ICD from home. This monitoring reduces the number of office visits required to check your device to one time per year. It allows [name] to keep an eye on the functioning of your device to ensure it is working properly. You are scheduled for a device check from home on 06/14/2023, 09/13/2023. You may send your transmission at any time that day. If you have a wireless device, the transmission will be sent automatically. After your physician reviews your transmission, you will receive a postcard with your next transmission date.  Your next appointment:   3 month(s)  The format for your next appointment:   In Person  Provider:   Sherryl Manges, MD    Thank you for choosing [facility]!!    [phone]

## 2023-06-06 NOTE — Progress Notes (Signed)
Cardiology Office Note:  .   Date:  06/06/2023  ID:  Dakota Gutierrez, DOB Apr 13, 1951, MRN [id number] PCP: Shade Flood, MD  [facility] Providers Cardiologist:  None Electrophysiologist:  Sherryl Manges, MD { Click to update primary MD,subspecialty MD or APP then REFRESH:1}   Patient Profile: .      PMH: Ventricular tachycardia S/p ICD implant in [place]  Hypertension Syncope Found to have ventricular tachycardia Abrupt visual loss Thought to be due to by ophthalmology to retinal branch artery occlusion   He established with cardiology and has been followed by Dr. Graciela Husbands.  He had syncope while living in [place] and underwent evaluation of easily inducible polymorphic ventricular tachycardia and received an ICD.  Unfortunately in the days that followed he developed VT storm with multiple shocks and was treated with amiodarone which was gradually titrated down to 100 mg daily.  He had an episode of monomorphic VT shortly after that terminated by antitachycardia pacing.  This has caused anxiety and fear of neck shock. Had elevated LFTs in 2013 and amiodarone was discontinued albeit with some reluctance on the part of the family. He underwent gen change by Dr. Graciela Husbands 04/26/2017.  He had recurrent episodes of ventricular tachycardia 2 of which required shock therapy and amiodarone was resumed in June 2022.  Tachycardia 08/2022 treated with antitachycardia pacing with recurrent therapy for post successful therapy sinus/atrial tachycardia.  The blockers were increased.  Hospitalized 02/05/2023 for VT storm and incessant VT which quieted with amiodarone and adjunctive lidocaine. Found to have significantly worsening coronary artery disease with three-vessel involvement and underwent stepwise intervention with initial stenting of LAD complicated via ventricular tachycardia and then subsequent stenting of mid RCA lesion.   Last cardiology clinic visit was 06/05/2023 with Dr. Graciela Husbands. He reported  following ranolazine develops abdominal and chest discomfort with dyspnea lasting often an hour or more, not associated with exertion. NSVT noted on device check. Plan to reach out to Dr. Berneice Gandy for assistance for catheter ablation. Troponin T ordered which was 8.        History of Present Illness: .   Dakota Gutierrez is a *** 72 y.o. male here today for evaluation of chest discomfort.   ROS: ***       Studies Reviewed: .        *** Risk Assessment/Calculations:             Physical Exam:   VS:  There were no vitals taken for this visit.   Wt Readings from Last 3 Encounters:  06/05/23 165 lb 3.2 oz (74.9 kg)  05/04/23 162 lb 3.2 oz (73.6 kg)  03/22/23 163 lb 6.4 oz (74.1 kg)    GEN: Well nourished, well developed in no acute distress NECK: No JVD; No carotid bruits CARDIAC: ***RRR, no murmurs, rubs, gallops RESPIRATORY:  Clear to auscultation without rales, wheezing or rhonchi  ABDOMEN: Soft, non-tender, non-distended EXTREMITIES:  No edema; No deformity     ASSESSMENT AND PLAN: .    CAD with angina: LHC 02/06/23 with prox-mid LAD 75%, mRCA 99%, acute mrg lesion 95%, RPAV 100% with DES to mid LAD complicated by VT storm. Successful PCI of severe stenosis of mRCA lesion with DES on 02/08/23.   Ventricular tachycardia: Hyperlipidemia: Mildly elevated Lp(a) at 88.9, LDL 53 on 02/07/23 Hypertension:     {Are you ordering a CV Procedure (e.g. stress test, cath, DCCV, TEE, etc)?   Press F2        :[id number]}  Dispo: ***  Signed, Eligha Bridegroom, NP-C

## 2023-06-06 NOTE — H&P (View-Only) (Signed)
Cardiology Office Note:  .   Date:  06/07/2023  ID:  [name], DOB 1951/02/19, MRN [id number] PCP: Shade Flood, MD  [facility] Providers Cardiologist:  None Electrophysiologist:  Sherryl Manges, MD    Patient Profile: .      PMH: CAD LHC 02/06/23 >> prox to mid LAD lesion 75%, mRCA 99%, acute mrg 95%, RPAV 100% >> DES 2.5 x 30 [employer] to [employer] >> VT storm with stent deployment 02/08/23 successful PCI of severe stenosis in mid RCA using 3.0 x 16 mm Synergy DES Ventricular tachycardia S/p ICD implant in [place]  Hypertension Syncope Found to have ventricular tachycardia Abrupt visual loss Thought to be due to by ophthalmology to retinal branch artery occlusion   He established with cardiology and has been followed by Dr. Graciela Husbands.  He had syncope while living in [place] and underwent evaluation of easily inducible polymorphic ventricular tachycardia and received an ICD.  Unfortunately in the days that followed he developed VT storm with multiple shocks and was treated with amiodarone which was gradually titrated down to 100 mg daily.  He had an episode of monomorphic VT shortly after that terminated by antitachycardia pacing.  This has caused anxiety and fear of neck shock. Had elevated LFTs in 2013 and amiodarone was discontinued albeit with some reluctance on the part of the family. He underwent gen change by Dr. Graciela Husbands 04/26/2017.  He had recurrent episodes of ventricular tachycardia 2 of which required shock therapy and amiodarone was resumed in June 2022.  Tachycardia 08/2022 treated with antitachycardia pacing with recurrent therapy for post successful therapy sinus/atrial tachycardia. Beta blockers were increased.    Hospitalized 02/05/2023 for VT storm and incessant VT which quieted with amiodarone and adjunctive lidocaine. Found to have significantly worsening coronary artery disease with three-vessel involvement and underwent stepwise intervention with initial  stenting of LAD complicated via ventricular tachycardia and then subsequent stenting of mid RCA lesion.   Last cardiology clinic visit was 06/05/2023 with Dr. Graciela Husbands. He reported following ranolazine develops abdominal and chest discomfort with dyspnea lasting often an hour or more, not associated with exertion. NSVT noted on device check. Plan to reach out to Dr. Berneice Gandy for assistance for catheter ablation. Troponin T ordered which was 8.        History of Present Illness: .   Dakota Gutierrez is a very pleasant 72 y.o. male here today for evaluation of chest discomfort.  Continues to have a squeezing, aching, tightness in his chest. Discomfort improves with moving around, feels like it did in April prior to last cath. Feels like being stabbed with a needle at times in one area of his chest. Constantly feels chest tightness which usually resolves when laying down. However, last night pain persisted and he only got 2 hours of sleep. Has been staggering medications every 1-2 hours to isolate if one of the medications was making him feel worse. Only tolerated Ranexa 1000 mg twice daily for a few days, it made him feel poorly. Is more active when it is cooler outside. Watered flowers this morning before it got too hot. Not currently having significant chest discomfort. No palpitations, dyspnea, orthopnea, PND, presyncope, or syncope. Has had acid reflux symptoms in the past, this does not feel the same.   ROS: See HPI       Studies Reviewed: .         Risk Assessment/Calculations:  Physical Exam:   VS:  BP 120/70   Pulse 70   Ht 5\' 5"  (1.651 m)   Wt 164 lb 3.2 oz (74.5 kg)   SpO2 98%   BMI 27.32 kg/m    Wt Readings from Last 3 Encounters:  06/07/23 164 lb 3.2 oz (74.5 kg)  06/05/23 165 lb 3.2 oz (74.9 kg)  05/04/23 162 lb 3.2 oz (73.6 kg)    GEN: Well nourished, well developed in no acute distress NECK: No JVD; No carotid bruits CARDIAC: RRR, no murmurs, rubs,  gallops RESPIRATORY:  Clear to auscultation without rales, wheezing or rhonchi  ABDOMEN: Soft, non-tender, non-distended EXTREMITIES:  No edema; No deformity     ASSESSMENT AND PLAN: .    CAD with angina: LHC 02/06/23 with prox-mid LAD 75%, mRCA 99%, acute mrg lesion 95%, RPAV 100% with DES to mid LAD complicated by VT storm. Successful PCI of severe stenosis of mRCA lesion with DES on 02/08/23.  He has been feeling well until the last few weeks with at least 2 episodes of more intense chest discomfort. Reports an almost constant chest tightness, squeezing and aching that typically improves when laying down. Pain does not worsen with exertion. No dyspnea, n/v, or diaphoresis. Pain somewhat atypical for angina but mimics pain prior to last cath 01/2023. Reviewed case with Dr. Excell Seltzer who agrees patient should proceed with cardiac catheterization. Risks of cardiac catheterization reviewed and patient agrees to proceed. Consider discontinuing ranolazine in the setting of intolerance. No bleeding concerns. Continue DAPT including Brilinta and aspirin.   Ventricular tachycardia: Physician device check with Dr. Graciela Husbands on 06/05/2023. He is being referred to Dr. Berneice Gandy for consideration of VT ablation. No acute concerns today. Management per Dr. Graciela Husbands.  Hyperlipidemia LDL goal < 55: Mildly elevated Lp(a) at 88.9, LDL 53 on 02/07/23. Continue atorvastatin.   Hypertension: BP is well controlled. No medication changes today.     Informed Consent   Shared Decision Making/Informed Consent The risks [stroke (1 in 1000), death (1 in 1000), kidney failure [usually temporary] (1 in 500), bleeding (1 in 200), allergic reaction [possibly serious] (1 in 200)], benefits (diagnostic support and management of coronary artery disease) and alternatives of a cardiac catheterization were discussed in detail with Mr. Weingartz and he is willing to proceed.     Dispo: 2-3 weeks post cath  Signed, Eligha Bridegroom, NP-C

## 2023-06-07 ENCOUNTER — Encounter: Payer: Self-pay | Admitting: Nurse Practitioner

## 2023-06-07 ENCOUNTER — Ambulatory Visit: Payer: Medicare PPO | Attending: Nurse Practitioner | Admitting: Nurse Practitioner

## 2023-06-07 VITALS — BP 120/70 | HR 70 | Ht 65.0 in | Wt 164.2 lb

## 2023-06-07 DIAGNOSIS — I10 Essential (primary) hypertension: Secondary | ICD-10-CM | POA: Diagnosis not present

## 2023-06-07 DIAGNOSIS — E785 Hyperlipidemia, unspecified: Secondary | ICD-10-CM

## 2023-06-07 DIAGNOSIS — I25110 Atherosclerotic heart disease of native coronary artery with unstable angina pectoris: Secondary | ICD-10-CM

## 2023-06-07 DIAGNOSIS — I472 Ventricular tachycardia, unspecified: Secondary | ICD-10-CM | POA: Diagnosis not present

## 2023-06-07 DIAGNOSIS — R0789 Other chest pain: Secondary | ICD-10-CM

## 2023-06-07 DIAGNOSIS — Z95810 Presence of automatic (implantable) cardiac defibrillator: Secondary | ICD-10-CM

## 2023-06-07 DIAGNOSIS — I2510 Atherosclerotic heart disease of native coronary artery without angina pectoris: Secondary | ICD-10-CM | POA: Diagnosis not present

## 2023-06-07 NOTE — Patient Instructions (Signed)
Medication Instructions:  Your physician recommends that you continue on your current medications as directed. Please refer to the Current Medication list given to you today.  *If you need a refill on your cardiac medications before your next appointment, please call your pharmacy*   Lab Work: BMET, CBC-TODAY If you have labs (blood work) drawn today and your tests are completely normal, you will receive your results only by: MyChart Message (if you have MyChart) OR A paper copy in the mail If you have any lab test that is abnormal or we need to change your treatment, we will call you to review the results.   Testing/Procedures:   Cardiac/Peripheral Catheterization   You are scheduled for a Cardiac Catheterization on Friday, July 12 with Dr. Peter [place].  1. Please arrive at the [facility] (Main Entrance A) at [facility]: [address] [place], [place] [address] at 10:00 AM (This time is 2 hour(s) before your procedure to ensure your preparation). Free valet parking service is available. You will check in at ADMITTING. The support person will be asked to wait in the waiting room.  It is OK to have someone drop you off and come back when you are ready to be discharged.        Special note: Every effort is made to have your procedure done on time. Please understand that emergencies sometimes delay scheduled procedures.  2. Diet: Do not eat solid foods after midnight.  You may have clear liquids until 5 AM the day of the procedure.  3. Labs: You will need to have blood drawn today-CBC, BMET  4. Medication instructions in preparation for your procedure:   Contrast Allergy: No   Do not take metformin on the morning of the procedure and HOLD 48 HOURS AFTER THE PROCEDURE. Do not take jardiance on the morning of the procedure  On the morning of your procedure, take Aspirin 81 mg and Brilinta/Ticagrelor and any morning medicines NOT listed above.  You may use sips of  water.  5. Plan to go home the same day, you will only stay overnight if medically necessary. 6. You MUST have a responsible adult to drive you home. 7. An adult MUST be with you the first 24 hours after you arrive home. 8. Bring a current list of your medications, and the last time and date medication taken. 9. Bring ID and current insurance cards. 10.Please wear clothes that are easy to get on and off and wear slip-on shoes.  Thank you for allowing [name] to care for you!   -- [facility]    Follow-Up: At [facility], you and your health needs are our priority.  As part of our continuing mission to provide you with exceptional heart care, we have created designated Provider Care Teams.  These Care Teams include your primary Cardiologist (physician) and Advanced Practice Providers (APPs -  Physician Assistants and Nurse Practitioners) who all work together to provide you with the care you need, when you need it.  Your next appointment:   2-3 week(s) after cath  Provider:   Eligha Bridegroom, NP

## 2023-06-08 ENCOUNTER — Telehealth: Payer: Self-pay | Admitting: *Deleted

## 2023-06-08 LAB — CBC
Hematocrit: 43.4 % (ref 37.5–51.0)
Hemoglobin: 14.2 g/dL (ref 13.0–17.7)
MCH: 30.2 pg (ref 26.6–33.0)
MCHC: 32.7 g/dL (ref 31.5–35.7)
MCV: 92 fL (ref 79–97)
Platelets: 186 10*3/uL (ref 150–450)
RBC: 4.7 x10E6/uL (ref 4.14–5.80)
RDW: 14 % (ref 11.6–15.4)
WBC: 6.6 10*3/uL (ref 3.4–10.8)

## 2023-06-08 LAB — BASIC METABOLIC PANEL
BUN/Creatinine Ratio: 20 (ref 10–24)
BUN: 21 mg/dL (ref 8–27)
CO2: 22 mmol/L (ref 20–29)
Calcium: 9.5 mg/dL (ref 8.6–10.2)
Chloride: 100 mmol/L (ref 96–106)
Creatinine, Ser: 1.07 mg/dL (ref 0.76–1.27)
Glucose: 118 mg/dL — ABNORMAL HIGH (ref 70–99)
Potassium: 4.5 mmol/L (ref 3.5–5.2)
Sodium: 139 mmol/L (ref 134–144)
eGFR: 73 mL/min/{1.73_m2} (ref >59)

## 2023-06-08 NOTE — Telephone Encounter (Signed)
Cardiac Catheterization scheduled at [facility] for: Friday June 09, 2023 12 Noon Arrival time [facility] Main Entrance A at: 10 AM  Nothing to eat after midnight prior to procedure, clear liquids until 5 AM day of procedure.  Medication instructions: -Hold:  Metformin-day of procedure and 48 hours post procedure  Jardiance-AM of procedure  Olmesartan-hydrochlorothiazide -AM of procedure -Other usual morning medications can be taken with sips of water including aspirin 81 mg and Brilinta 90 mg  Confirmed patient has responsible adult to drive home post procedure and be with patient first 24 hours after arriving home.  Plan to go home the same day, you will only stay overnight if medically necessary.  Reviewed procedure instructions with patient.

## 2023-06-09 ENCOUNTER — Other Ambulatory Visit: Payer: Self-pay

## 2023-06-09 ENCOUNTER — Ambulatory Visit (HOSPITAL_COMMUNITY)
Admission: RE | Admit: 2023-06-09 | Discharge: 2023-06-09 | Disposition: A | Discharge status: Home / Self Care | Payer: Medicare PPO | Attending: Cardiology | Admitting: Cardiology

## 2023-06-09 ENCOUNTER — Encounter (HOSPITAL_COMMUNITY)
Admission: RE | Disposition: A | Discharge status: Home / Self Care | Payer: Self-pay | Source: Home / Self Care | Attending: Cardiology

## 2023-06-09 DIAGNOSIS — I472 Ventricular tachycardia, unspecified: Secondary | ICD-10-CM | POA: Insufficient documentation

## 2023-06-09 DIAGNOSIS — Z955 Presence of coronary angioplasty implant and graft: Secondary | ICD-10-CM | POA: Insufficient documentation

## 2023-06-09 DIAGNOSIS — Z79899 Other long term (current) drug therapy: Secondary | ICD-10-CM | POA: Diagnosis not present

## 2023-06-09 DIAGNOSIS — Z7982 Long term (current) use of aspirin: Secondary | ICD-10-CM | POA: Insufficient documentation

## 2023-06-09 DIAGNOSIS — Z95810 Presence of automatic (implantable) cardiac defibrillator: Secondary | ICD-10-CM | POA: Diagnosis not present

## 2023-06-09 DIAGNOSIS — I25110 Atherosclerotic heart disease of native coronary artery with unstable angina pectoris: Secondary | ICD-10-CM | POA: Diagnosis not present

## 2023-06-09 DIAGNOSIS — I2510 Atherosclerotic heart disease of native coronary artery without angina pectoris: Secondary | ICD-10-CM | POA: Diagnosis not present

## 2023-06-09 DIAGNOSIS — I10 Essential (primary) hypertension: Secondary | ICD-10-CM | POA: Diagnosis not present

## 2023-06-09 DIAGNOSIS — E1165 Type 2 diabetes mellitus with hyperglycemia: Secondary | ICD-10-CM

## 2023-06-09 DIAGNOSIS — H547 Unspecified visual loss: Secondary | ICD-10-CM | POA: Insufficient documentation

## 2023-06-09 DIAGNOSIS — Z7902 Long term (current) use of antithrombotics/antiplatelets: Secondary | ICD-10-CM | POA: Insufficient documentation

## 2023-06-09 HISTORY — PX: LEFT HEART CATH AND CORONARY ANGIOGRAPHY: CATH118249

## 2023-06-09 LAB — GLUCOSE, CAPILLARY: Glucose-Capillary: 130 mg/dL — ABNORMAL HIGH (ref 70–99)

## 2023-06-09 SURGERY — LEFT HEART CATH AND CORONARY ANGIOGRAPHY
Anesthesia: LOCAL

## 2023-06-09 MED ORDER — FENTANYL CITRATE (PF) 100 MCG/2ML IJ SOLN
INTRAMUSCULAR | Status: AC
  Filled 2023-06-09: qty 2

## 2023-06-09 MED ORDER — SODIUM CHLORIDE 0.9 % WEIGHT BASED INFUSION: 1.0000 mL/kg/h | INTRAVENOUS | Status: DC

## 2023-06-09 MED ORDER — VERAPAMIL HCL 2.5 MG/ML IV SOLN
INTRAVENOUS | Status: DC | PRN
  Administered 2023-06-09: 10 mL via INTRA_ARTERIAL

## 2023-06-09 MED ORDER — MIDAZOLAM HCL 2 MG/2ML IJ SOLN
INTRAMUSCULAR | Status: DC | PRN
  Administered 2023-06-09: 1.5 mg via INTRAVENOUS

## 2023-06-09 MED ORDER — HEPARIN (PORCINE) IN NACL 1000-0.9 UT/500ML-% IV SOLN
INTRAVENOUS | Status: DC | PRN
  Administered 2023-06-09 (×2): 500 mL

## 2023-06-09 MED ORDER — SODIUM CHLORIDE 0.9 % IV SOLN: 250.0000 mL | INTRAVENOUS | Status: DC | PRN

## 2023-06-09 MED ORDER — LIDOCAINE HCL (PF) 1 % IJ SOLN
INTRAMUSCULAR | Status: AC
  Filled 2023-06-09: qty 30

## 2023-06-09 MED ORDER — ASPIRIN 81 MG PO CHEW: 81.0000 mg | CHEWABLE_TABLET | ORAL | Status: DC

## 2023-06-09 MED ORDER — IOHEXOL 350 MG/ML SOLN
INTRAVENOUS | Status: DC | PRN
  Administered 2023-06-09: 45 mL

## 2023-06-09 MED ORDER — LIDOCAINE HCL (PF) 1 % IJ SOLN
INTRAMUSCULAR | Status: DC | PRN
  Administered 2023-06-09: 2 mL

## 2023-06-09 MED ORDER — FENTANYL CITRATE (PF) 100 MCG/2ML IJ SOLN
INTRAMUSCULAR | Status: DC | PRN
  Administered 2023-06-09: 25 ug via INTRAVENOUS

## 2023-06-09 MED ORDER — SODIUM CHLORIDE 0.9% FLUSH: 3.0000 mL | INTRAVENOUS | Status: DC | PRN

## 2023-06-09 MED ORDER — METFORMIN HCL 500 MG PO TABS
500.0000 mg | ORAL_TABLET | Freq: Every day | ORAL | Status: DC
Start: 2023-06-12 — End: 2024-01-22

## 2023-06-09 MED ORDER — ACETAMINOPHEN 325 MG PO TABS: 650.0000 mg | ORAL_TABLET | ORAL | Status: DC | PRN

## 2023-06-09 MED ORDER — VERAPAMIL HCL 2.5 MG/ML IV SOLN
INTRAVENOUS | Status: AC
  Filled 2023-06-09: qty 2

## 2023-06-09 MED ORDER — HEPARIN SODIUM (PORCINE) 1000 UNIT/ML IJ SOLN
INTRAMUSCULAR | Status: AC
  Filled 2023-06-09: qty 10

## 2023-06-09 MED ORDER — SODIUM CHLORIDE 0.9% FLUSH: 3.0000 mL | Freq: Two times a day (BID) | INTRAVENOUS | Status: DC

## 2023-06-09 MED ORDER — HEPARIN SODIUM (PORCINE) 1000 UNIT/ML IJ SOLN
INTRAMUSCULAR | Status: DC | PRN
  Administered 2023-06-09: 4000 [IU] via INTRAVENOUS

## 2023-06-09 MED ORDER — ONDANSETRON HCL 4 MG/2ML IJ SOLN: 4.0000 mg | Freq: Four times a day (QID) | INTRAMUSCULAR | Status: DC | PRN

## 2023-06-09 MED ORDER — MIDAZOLAM HCL 2 MG/2ML IJ SOLN
INTRAMUSCULAR | Status: AC
  Filled 2023-06-09: qty 2

## 2023-06-09 MED ORDER — SODIUM CHLORIDE 0.9 % WEIGHT BASED INFUSION
3.0000 mL/kg/h | INTRAVENOUS | Status: AC
  Administered 2023-06-09: 3 mL/kg/h via INTRAVENOUS

## 2023-06-09 MED ORDER — HYDRALAZINE HCL 20 MG/ML IJ SOLN: 10.0000 mg | INTRAMUSCULAR | Status: DC | PRN

## 2023-06-09 SURGICAL SUPPLY — 9 items
CATH 5FR JL3.5 JR4 ANG PIG MP (CATHETERS) IMPLANT
DEVICE RAD COMP TR BAND LRG (VASCULAR PRODUCTS) IMPLANT
GLIDESHEATH SLEND SS 6F .021 (SHEATH) IMPLANT
GUIDEWIRE INQWIRE 1.5J.035X260 (WIRE) IMPLANT
INQWIRE 1.5J .035X260CM (WIRE) ×1
KIT HEART LEFT (KITS) ×1 IMPLANT
PACK CARDIAC CATHETERIZATION (CUSTOM PROCEDURE TRAY) ×1 IMPLANT
TRANSDUCER W/STOPCOCK (MISCELLANEOUS) ×1 IMPLANT
TUBING CIL FLEX 10 FLL-RA (TUBING) ×1 IMPLANT

## 2023-06-09 NOTE — Interval H&P Note (Signed)
History and Physical Interval Note:  06/09/2023 12:14 PM  Dakota Gutierrez  has presented today for surgery, with the diagnosis of cad w/unstable angina.  The various methods of treatment have been discussed with the patient and family. After consideration of risks, benefits and other options for treatment, the patient has consented to  Procedure(s): LEFT HEART CATH AND CORONARY ANGIOGRAPHY (N/A) as a surgical intervention.  The patient's history has been reviewed, patient examined, no change in status, stable for surgery.  I have reviewed the patient's chart and labs.  Questions were answered to the patient's satisfaction.   Cath Lab Visit (complete for each Cath Lab visit)  Clinical Evaluation Leading to the Procedure:   ACS: Yes.    Non-ACS:    Anginal Classification: CCS III  Anti-ischemic medical therapy: Maximal Therapy (2 or more classes of medications)  Non-Invasive Test Results: No non-invasive testing performed  Prior CABG: No previous CABG        Theron Arista [facility] 06/09/2023 12:14 PM

## 2023-06-09 NOTE — Discharge Instructions (Signed)

## 2023-06-09 NOTE — Progress Notes (Signed)
3 cc of air removed from TR band, Site began to ooze, 3 cc of air returned to TR band, oozing stopped. Will resume TR band deflation at 1420. Will continue to monitor

## 2023-06-09 NOTE — Progress Notes (Signed)
TR BAND REMOVAL  LOCATION:    right radial  DEFLATED PER PROTOCOL:    Yes.    TIME BAND OFF / DRESSING APPLIED:    1520 gauze dressing applied    SITE UPON ARRIVAL:    Level 0  SITE AFTER BAND REMOVAL:    Level 0  CIRCULATION SENSATION AND MOVEMENT:    Within Normal Limits   Yes.    COMMENTS:   no new issues noted

## 2023-06-12 ENCOUNTER — Encounter (HOSPITAL_COMMUNITY): Payer: Self-pay | Admitting: Cardiology

## 2023-06-14 ENCOUNTER — Ambulatory Visit (INDEPENDENT_AMBULATORY_CARE_PROVIDER_SITE_OTHER): Payer: Medicare PPO

## 2023-06-14 DIAGNOSIS — H6122 Impacted cerumen, left ear: Secondary | ICD-10-CM | POA: Diagnosis not present

## 2023-06-14 DIAGNOSIS — I472 Ventricular tachycardia, unspecified: Secondary | ICD-10-CM | POA: Diagnosis not present

## 2023-06-15 LAB — CUP PACEART REMOTE DEVICE CHECK
Battery Remaining Longevity: 20 mo
Battery Voltage: 2.92 V
Brady Statistic AP VP Percent: 76.75 %
Brady Statistic AP VS Percent: 22.98 %
Brady Statistic AS VP Percent: 0 %
Brady Statistic AS VS Percent: 0.27 %
Brady Statistic RA Percent Paced: 99.69 %
Brady Statistic RV Percent Paced: 76.71 %
Date Time Interrogation Session: 20240717012508
HighPow Impedance: 40 Ohm
HighPow Impedance: 51 Ohm
Implantable Lead Connection Status: 753985
Implantable Lead Connection Status: 753985
Implantable Lead Implant Date: 20120531
Implantable Lead Implant Date: 20120531
Implantable Lead Location: 753859
Implantable Lead Location: 753860
Implantable Lead Model: 4076
Implantable Lead Model: 6947
Implantable Pulse Generator Implant Date: 20180530
Lead Channel Impedance Value: 342 Ohm
Lead Channel Impedance Value: 418 Ohm
Lead Channel Impedance Value: 456 Ohm
Lead Channel Pacing Threshold Amplitude: 0.75 V
Lead Channel Pacing Threshold Amplitude: 1 V
Lead Channel Pacing Threshold Pulse Width: 0.4 ms
Lead Channel Pacing Threshold Pulse Width: 0.4 ms
Lead Channel Sensing Intrinsic Amplitude: 0.875 mV
Lead Channel Sensing Intrinsic Amplitude: 0.875 mV
Lead Channel Sensing Intrinsic Amplitude: 10.5 mV
Lead Channel Sensing Intrinsic Amplitude: 10.5 mV
Lead Channel Setting Pacing Amplitude: 2 V
Lead Channel Setting Pacing Amplitude: 2 V
Lead Channel Setting Pacing Pulse Width: 0.4 ms
Lead Channel Setting Sensing Sensitivity: 0.3 mV
Zone Setting Status: 755011
Zone Setting Status: 755011

## 2023-06-16 DIAGNOSIS — H903 Sensorineural hearing loss, bilateral: Secondary | ICD-10-CM | POA: Diagnosis not present

## 2023-06-16 DIAGNOSIS — H9313 Tinnitus, bilateral: Secondary | ICD-10-CM | POA: Diagnosis not present

## 2023-06-22 DIAGNOSIS — I472 Ventricular tachycardia, unspecified: Secondary | ICD-10-CM | POA: Diagnosis not present

## 2023-06-22 DIAGNOSIS — Z01810 Encounter for preprocedural cardiovascular examination: Secondary | ICD-10-CM | POA: Diagnosis not present

## 2023-06-26 ENCOUNTER — Ambulatory Visit: Payer: Medicare PPO | Admitting: Nurse Practitioner

## 2023-06-29 NOTE — Progress Notes (Signed)
Remote ICD transmission.   

## 2023-07-18 ENCOUNTER — Ambulatory Visit: Payer: Medicare PPO | Admitting: Podiatry

## 2023-07-18 ENCOUNTER — Ambulatory Visit (INDEPENDENT_AMBULATORY_CARE_PROVIDER_SITE_OTHER): Payer: Medicare PPO

## 2023-07-18 DIAGNOSIS — M21621 Bunionette of right foot: Secondary | ICD-10-CM | POA: Diagnosis not present

## 2023-07-18 DIAGNOSIS — M779 Enthesopathy, unspecified: Secondary | ICD-10-CM

## 2023-07-18 DIAGNOSIS — E1165 Type 2 diabetes mellitus with hyperglycemia: Secondary | ICD-10-CM

## 2023-07-18 DIAGNOSIS — Q828 Other specified congenital malformations of skin: Secondary | ICD-10-CM | POA: Diagnosis not present

## 2023-07-18 NOTE — Progress Notes (Signed)
  Subjective:  Patient ID: Dakota Gutierrez, male    DOB: 08-Feb-1951,  MRN: [id number]  Chief Complaint  Patient presents with   Callouses    Corn to right foot lateral aspect of foot. Painful.     72 y.o. male presents with the above complaint. History confirmed with patient.   Objective:  Physical Exam: warm, good capillary refill, no trophic changes or ulcerative lesions, normal DP and PT pulses, normal sensory exam, and tailor's bunion deformity noted, porokeratosis submetatarsal 5.   Radiographs: Multiple views x-ray of the right foot: Hallux valgus and tailor's bunion deformity noted Assessment:   1. Tailor's bunionette, right   2. Porokeratosis   3. Type 2 diabetes mellitus with hyperglycemia, without long-term current use of insulin (HCC)      Plan:  Patient was evaluated and treated and all questions answered.  We reviewed his x-rays has porokeratosis as well as structural tailor's bunion deformity.  I debrided the lesion and applied salicylic acid to eliminate it.  We also discussed offloading the area utilizing a dancers pad in his shoe.  These were placed.  He will return as needed for follow-up if it does not improve or worsens. Return if symptoms worsen or fail to improve.

## 2023-07-20 ENCOUNTER — Other Ambulatory Visit: Payer: Self-pay | Admitting: Family Medicine

## 2023-07-20 DIAGNOSIS — E1165 Type 2 diabetes mellitus with hyperglycemia: Secondary | ICD-10-CM

## 2023-07-20 DIAGNOSIS — I10 Essential (primary) hypertension: Secondary | ICD-10-CM

## 2023-07-20 DIAGNOSIS — E785 Hyperlipidemia, unspecified: Secondary | ICD-10-CM

## 2023-08-01 ENCOUNTER — Other Ambulatory Visit: Payer: Self-pay

## 2023-08-01 ENCOUNTER — Other Ambulatory Visit (HOSPITAL_COMMUNITY): Payer: Self-pay

## 2023-08-09 DIAGNOSIS — Z01818 Encounter for other preprocedural examination: Secondary | ICD-10-CM | POA: Diagnosis not present

## 2023-08-09 DIAGNOSIS — Z01810 Encounter for preprocedural cardiovascular examination: Secondary | ICD-10-CM | POA: Diagnosis not present

## 2023-08-18 ENCOUNTER — Telehealth: Payer: Self-pay | Admitting: Internal Medicine

## 2023-08-18 NOTE — Telephone Encounter (Signed)
Spoke with pt's wife, DPR and advised pt will need to hold medications as instructed by Dr Berneice Gandy.  Pt's wife states pt is nervous to hold Amiodarone and Ranexa and is concerned he will be shocked by his device.  Reviewed ED precautions and encouraged no alcohol, strenuous activity or caffeine.  Pt's wife verbalizes understanding and thanked Char[employer] fundraiser for the call.

## 2023-08-18 NOTE — Telephone Encounter (Signed)
Pt c/o medication issue:  1. Name of Medication: amiodarone (PACERONE) 200 MG tablet   2. How are you currently taking this medication (dosage and times per day)?   3. Are you having a reaction (difficulty breathing--STAT)?   4. What is your medication issue? Wife called stating he has to stop taking this medication 7 days before a heart procedure and she has some concerns about that.  She would like to discuss this with Dr. Odessa Fleming nurse about this.  Today is the first day he would not be able to take it.

## 2023-08-23 ENCOUNTER — Telehealth: Payer: Self-pay

## 2023-08-23 NOTE — Telephone Encounter (Signed)
Dakota Gutierrez was returning phone call. Please call back

## 2023-08-23 NOTE — Telephone Encounter (Signed)
Pt is getting an ablation done and they need strips from March of this year that shows the pt is recurrent VT with shocks so that the insurance can approve it. Direct # [phone] Ext. [phone] Fax # [phone]

## 2023-08-23 NOTE — Telephone Encounter (Signed)
Attempted to reach [place] at [facility]. LMTCB.   Will need to clarify.

## 2023-08-24 NOTE — Telephone Encounter (Signed)
Call back received.  VT strips faxed as requested.

## 2023-08-24 NOTE — Telephone Encounter (Signed)
Duplicate

## 2023-08-25 DIAGNOSIS — I509 Heart failure, unspecified: Secondary | ICD-10-CM | POA: Diagnosis not present

## 2023-08-25 DIAGNOSIS — E119 Type 2 diabetes mellitus without complications: Secondary | ICD-10-CM | POA: Diagnosis not present

## 2023-08-25 DIAGNOSIS — I255 Ischemic cardiomyopathy: Secondary | ICD-10-CM | POA: Diagnosis not present

## 2023-08-25 DIAGNOSIS — I110 Hypertensive heart disease with heart failure: Secondary | ICD-10-CM | POA: Diagnosis not present

## 2023-08-25 DIAGNOSIS — I341 Nonrheumatic mitral (valve) prolapse: Secondary | ICD-10-CM | POA: Diagnosis not present

## 2023-08-25 DIAGNOSIS — I472 Ventricular tachycardia, unspecified: Secondary | ICD-10-CM | POA: Diagnosis not present

## 2023-08-25 DIAGNOSIS — I2510 Atherosclerotic heart disease of native coronary artery without angina pectoris: Secondary | ICD-10-CM | POA: Diagnosis not present

## 2023-08-25 DIAGNOSIS — E785 Hyperlipidemia, unspecified: Secondary | ICD-10-CM | POA: Diagnosis not present

## 2023-08-26 DIAGNOSIS — I2510 Atherosclerotic heart disease of native coronary artery without angina pectoris: Secondary | ICD-10-CM | POA: Diagnosis not present

## 2023-08-26 DIAGNOSIS — I255 Ischemic cardiomyopathy: Secondary | ICD-10-CM | POA: Diagnosis not present

## 2023-08-26 DIAGNOSIS — I472 Ventricular tachycardia, unspecified: Secondary | ICD-10-CM | POA: Diagnosis not present

## 2023-08-26 DIAGNOSIS — I10 Essential (primary) hypertension: Secondary | ICD-10-CM | POA: Diagnosis not present

## 2023-09-06 ENCOUNTER — Other Ambulatory Visit: Payer: Self-pay

## 2023-09-06 DIAGNOSIS — E1165 Type 2 diabetes mellitus with hyperglycemia: Secondary | ICD-10-CM

## 2023-09-06 MED ORDER — EMPAGLIFLOZIN 25 MG PO TABS: 25.0000 mg | ORAL_TABLET | Freq: Every day | ORAL | 1 refills | Status: DC

## 2023-09-13 ENCOUNTER — Ambulatory Visit (INDEPENDENT_AMBULATORY_CARE_PROVIDER_SITE_OTHER): Payer: Medicare PPO

## 2023-09-13 DIAGNOSIS — I5032 Chronic diastolic (congestive) heart failure: Secondary | ICD-10-CM | POA: Diagnosis not present

## 2023-09-13 DIAGNOSIS — I472 Ventricular tachycardia, unspecified: Secondary | ICD-10-CM

## 2023-09-14 LAB — CUP PACEART REMOTE DEVICE CHECK
Battery Remaining Longevity: 15 mo
Battery Voltage: 2.91 V
Brady Statistic AP VP Percent: 0.25 %
Brady Statistic AP VS Percent: 96.72 %
Brady Statistic AS VP Percent: 0.01 %
Brady Statistic AS VS Percent: 3.02 %
Brady Statistic RA Percent Paced: 96.82 %
Brady Statistic RV Percent Paced: 0.27 %
Date Time Interrogation Session: 20241016033430
HighPow Impedance: 40 Ohm
HighPow Impedance: 47 Ohm
Implantable Lead Connection Status: 753985
Implantable Lead Connection Status: 753985
Implantable Lead Implant Date: 20120531
Implantable Lead Implant Date: 20120531
Implantable Lead Location: 753859
Implantable Lead Location: 753860
Implantable Lead Model: 4076
Implantable Lead Model: 6947
Implantable Pulse Generator Implant Date: 20180530
Lead Channel Impedance Value: 304 Ohm
Lead Channel Impedance Value: 361 Ohm
Lead Channel Impedance Value: 456 Ohm
Lead Channel Pacing Threshold Amplitude: 0.75 V
Lead Channel Pacing Threshold Amplitude: 0.875 V
Lead Channel Pacing Threshold Pulse Width: 0.4 ms
Lead Channel Pacing Threshold Pulse Width: 0.4 ms
Lead Channel Sensing Intrinsic Amplitude: 0.75 mV
Lead Channel Sensing Intrinsic Amplitude: 0.75 mV
Lead Channel Sensing Intrinsic Amplitude: 12.5 mV
Lead Channel Sensing Intrinsic Amplitude: 12.5 mV
Lead Channel Setting Pacing Amplitude: 1.75 V
Lead Channel Setting Pacing Amplitude: 2 V
Lead Channel Setting Pacing Pulse Width: 0.4 ms
Lead Channel Setting Sensing Sensitivity: 0.3 mV
Zone Setting Status: 755011
Zone Setting Status: 755011

## 2023-09-25 DIAGNOSIS — I472 Ventricular tachycardia, unspecified: Secondary | ICD-10-CM | POA: Diagnosis not present

## 2023-09-29 ENCOUNTER — Encounter: Payer: Self-pay | Admitting: Internal Medicine

## 2023-09-29 ENCOUNTER — Ambulatory Visit: Payer: Medicare PPO | Attending: Internal Medicine | Admitting: Internal Medicine

## 2023-09-29 VITALS — BP 92/57 | HR 75 | Ht 68.0 in | Wt 172.2 lb

## 2023-09-29 DIAGNOSIS — E039 Hypothyroidism, unspecified: Secondary | ICD-10-CM | POA: Diagnosis not present

## 2023-09-29 DIAGNOSIS — I472 Ventricular tachycardia, unspecified: Secondary | ICD-10-CM | POA: Diagnosis not present

## 2023-09-29 DIAGNOSIS — Z79899 Other long term (current) drug therapy: Secondary | ICD-10-CM | POA: Diagnosis not present

## 2023-09-29 DIAGNOSIS — I495 Sick sinus syndrome: Secondary | ICD-10-CM

## 2023-09-29 DIAGNOSIS — Z95810 Presence of automatic (implantable) cardiac defibrillator: Secondary | ICD-10-CM | POA: Diagnosis not present

## 2023-09-29 LAB — CUP PACEART INCLINIC DEVICE CHECK
Date Time Interrogation Session: 20241101110512
Implantable Lead Connection Status: 753985
Implantable Lead Connection Status: 753985
Implantable Lead Implant Date: 20120531
Implantable Lead Implant Date: 20120531
Implantable Lead Location: 753859
Implantable Lead Location: 753860
Implantable Lead Model: 4076
Implantable Lead Model: 6947
Implantable Pulse Generator Implant Date: 20180530

## 2023-09-29 MED ORDER — AMIODARONE HCL 200 MG PO TABS: 200.0000 mg | ORAL_TABLET | Freq: Every day | ORAL | Status: DC

## 2023-09-29 MED ORDER — METOPROLOL SUCCINATE ER 100 MG PO TB24: 100.0000 mg | ORAL_TABLET | Freq: Every morning | ORAL | Status: DC

## 2023-09-29 NOTE — Patient Instructions (Addendum)
Medication Instructions:  Your physician has recommended you make the following change in your medication:   Decrease Metoprolol 100mg  to 1 tablet by mouth every morning   *If you need a refill on your cardiac medications before your next appointment, please call your pharmacy*   Lab Work:  CMET and TSH today  If you have labs (blood work) drawn today and your tests are completely normal, you will receive your results only by: MyChart Message (if you have MyChart) OR A paper copy in the mail If you have any lab test that is abnormal or we need to change your treatment, we will call you to review the results.   Testing/Procedures: None ordered.    Follow-Up: At [facility], you and your health needs are our priority.  As part of our continuing mission to provide you with exceptional heart care, we have created designated Provider Care Teams.  These Care Teams include your primary Cardiologist (physician) and Advanced Practice Providers (APPs -  Physician Assistants and Nurse Practitioners) who all work together to provide you with the care you need, when you need it.  We recommend signing up for the patient portal called "MyChart".  Sign up information is provided on this After Visit Summary.  MyChart is used to connect with patients for Virtual Visits (Telemedicine).  Patients are able to view lab/test results, encounter notes, upcoming appointments, etc.  Non-urgent messages can be sent to your provider as well.   To learn more about what you can do with MyChart, go to [email or web address].    Your next appointment:   6 months with Dr Graciela Husbands

## 2023-09-29 NOTE — Progress Notes (Signed)
Patient Care Team: Shade Flood, MD as PCP - General (Family Medicine) Duke Salvia, MD as PCP - Electrophysiology (Cardiology) Duke Salvia, MD (Cardiology) Huston Foley, MD as Attending Physician (Neurology)   HPI  Dakota Gutierrez is a 72 y.o. 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 Unfortunately, in the days that followed he developed VT storm with multiple shocks and was treated with amiodarone which has been gradually down titrated to 100 mg a day.  Amiodarone surveillance had demonstrated elevated liver function tests spring 2013 . We elected to discontinue the amiodarone albeit with some reluctance on the part of the family. When seen in June 2022 of recurrent episodes of ventricular tachycardia 2 of which required therapy. He was then resumed on amiodarone and referred for consultation with GI  most recently 4/17 transaminases were normal.     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  He underwent catheter because of the response to lidocaine, he was found to have significantly worsening coronary artery disease with three-vessel involvement and underwent stepwise intervention with initially stenting of the LAD complicated by ventricular tachycardia and then subsequently by stenting of a mid RCA lesion.  4/24 Interval slow ventricular tachycardia below detection that terminated spontaneously, cycle length varied from 530--450 ms.  7/24  recurrent ventricular tachycardia despite amiodarone.  Referred to Dr  Berneice Gandy underwent catheter ablation 9/24 without subsequent ventricular tachycardia  Feeling gradually significantly better.  Blood pressure is low but no lightheadedness.  Vague chest pains.  Dyspnea is improving.  No edema.       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    Past Surgical History:  Procedure Laterality Date   CARDIAC DEFIBRILLATOR PLACEMENT     Medtronic [id number] Protecta   CATARACT EXTRACTION, BILATERAL  2019  CHOLECYSTECTOMY     COLONOSCOPY WITH PROPOFOL N/A 06/30/2017   Procedure: COLONOSCOPY WITH PROPOFOL;  Surgeon: Jeani Hawking, MD;  Location: [facility] ENDOSCOPY;  Service: Endoscopy;  Laterality: N/A;   CORONARY PRESSURE/FFR STUDY N/A 02/06/2023   Procedure: INTRAVASCULAR PRESSURE WIRE/FFR STUDY;  Surgeon: Runell Gess, MD;  Location: [facility] INVASIVE CV LAB;  Service: Cardiovascular;  Laterality: N/A;   CORONARY STENT INTERVENTION N/A 02/06/2023   Procedure: CORONARY STENT INTERVENTION;  Surgeon: Runell Gess, MD;  Location: [facility] INVASIVE CV  LAB;  Service: Cardiovascular;  Laterality: N/A;   CORONARY STENT INTERVENTION N/A 02/08/2023   Procedure: CORONARY STENT INTERVENTION;  Surgeon: Tonny Bollman, MD;  Location: [facility] INVASIVE CV LAB;  Service: Cardiovascular;  Laterality: N/A;   GALLBLADDER SURGERY     HERNIA REPAIR     ICD GENERATOR CHANGEOUT N/A 04/26/2017   Procedure: ICD Generator Changeout;  Surgeon: Duke Salvia, MD;  Location: [facility] INVASIVE CV LAB;  Service: Cardiovascular;  Laterality: N/A;   LEFT HEART CATH AND CORONARY ANGIOGRAPHY N/A 02/06/2023   Procedure: LEFT HEART CATH AND CORONARY ANGIOGRAPHY;  Surgeon: Runell Gess, MD;  Location: [facility] INVASIVE CV LAB;  Service: Cardiovascular;  Laterality: N/A;   LEFT HEART CATH AND CORONARY ANGIOGRAPHY N/A 06/09/2023   Procedure: LEFT HEART CATH AND CORONARY ANGIOGRAPHY;  Surgeon: [place], Peter M, MD;  Location: [facility] INVASIVE CV LAB;  Service: Cardiovascular;  Laterality: N/A;   TONSILLECTOMY AND ADENOIDECTOMY      Current Outpatient Medications  Medication Sig Dispense Refill   acetaminophen (TYLENOL) 500 MG tablet Take 1,000 mg by mouth daily as needed for moderate pain or headache.     ALPRAZolam (XANAX) 0.25 MG tablet Take 1 tablet (0.25 mg total) by mouth 2 (two) times daily as needed for anxiety. 30 tablet 1   amiodarone (PACERONE) 200 MG tablet Take 400 mg twice daily for 2 weeks, then take 400 mg once daily thereafter (Patient taking differently: Take 200 mg by mouth in the morning and at bedtime.) 180 tablet 1   aspirin EC 81 MG tablet Take 81 mg by mouth daily at 12 noon.     atorvastatin (LIPITOR) 10 MG tablet TAKE 1 TABLET BY MOUTH EVERY DAY 90 tablet 2   Cholecalciferol (VITAMIN D) 2000 UNITS tablet Take 2,000 Units by mouth daily at 12 noon.     empagliflozin (JARDIANCE) 25 MG TABS tablet Take 1 tablet (25 mg total) by mouth daily before breakfast. 90 tablet 1   famotidine (PEPCID) 20 MG tablet Take 1 tablet (20 mg total) by mouth 2 (two) times daily as needed. 60  tablet 4   metFORMIN (GLUCOPHAGE) 500 MG tablet Take 1 tablet (500 mg total) by mouth daily with breakfast.     metoprolol succinate (TOPROL-XL) 100 MG 24 hr tablet Take 1 tablet (100 mg) by mouth in the morning and 1/2 tablet (50 mg) by mouth in the evening with or immediately following a meal. 135 tablet 3   olmesartan-hydrochlorothiazide (BENICAR HCT) 20-12.5 MG tablet TAKE 1 TABLET BY MOUTH EVERY DAY 90 tablet 2   Omega-3 Fatty Acids (FISH OIL) 1000 MG CAPS Take 1,000 mg by mouth 2 (two) times daily.     ranolazine (RANEXA) 1000 MG SR tablet Take 1 tablet (1,000 mg total) by mouth 2 (two) times daily. 180 tablet 2   ranolazine (RANEXA) 500 MG 12 hr tablet Take 500 mg by mouth 2 (two) times daily.     tadalafil (CIALIS) 20 MG tablet Take 0.5-1 tablets (10-20  mg total) by mouth daily as needed for erectile dysfunction. 10 tablet 11   ticagrelor (BRILINTA) 90 MG TABS tablet Take 1 tablet (90 mg total) by mouth 2 (two) times daily. 180 tablet 3   No current facility-administered medications for this visit.    Allergies  Allergen Reactions   Adhesive [Tape]     Swelling and welts formed    Review of Systems negative except from HPI and PMH except for complaints of back pain and muscle pain  Physical Exam BP (!) 92/57   Pulse 75   Ht 5\' 8"  (1.727 m)   Wt 172 lb 3.2 oz (78.1 kg)   SpO2 97%   BMI 26.18 kg/m  Well developed and well nourished in no acute distress HENT normal Neck supple   Clear Device pocket well healed; without hematoma or erythema.  There is no tethering  Regular rate and rhythm, no  gallop No murmur Abd-soft with active BS No Clubbing cyanosis  edema Skin-warm and dry A & Oriented  Grossly normal sensory and motor function  ECG atrial pacing at 75 Intervals 28/12/42  Device function is normal. Programming changes none  See Paceart for details       Device function is normal. Programming changes were made to try to help improve detection of nonsustained  ventricular tachycardia so the monitoring zone was programmed to 600 ms, max sensor rate was decreased to 95, VT detection was programmed on at 550 ms.  It is notable that his ventricular tachycardia episode on 3/22 was associated with VA block  And   second VT zone from 177-222 and antitachycardia pacing See Paceart for details    Assessment and  Plan  VT recurrent  ICD Medtronic    Sinus noded dysfunction   Amiodarone rx   Ischemic heart disease  PTSD  No interval ventricular tachycardia.  Will continue the amiodarone at 200 mg a day.  Anticipate considering down titration in 6 months.  Will check amiodarone surveillance laboratories today.  With his blood pressure low, we will decrease his metoprolol from 100/50--100 a day.  Ranolazine was stopped earlier this week at [facility].  No symptoms of chest pain.  Reviewed Dr. Benay Spice note wants DAPT for 1 year.  Continue on atorvastatin last LDL was 34.Marland Kitchen

## 2023-09-30 LAB — COMPREHENSIVE METABOLIC PANEL WITH GFR
ALT: 66 IU/L — ABNORMAL HIGH (ref 0–44)
AST: 68 IU/L — ABNORMAL HIGH (ref 0–40)
Albumin: 4.3 g/dL (ref 3.8–4.8)
Alkaline Phosphatase: 58 IU/L (ref 44–121)
BUN/Creatinine Ratio: 18 (ref 10–24)
BUN: 20 mg/dL (ref 8–27)
Bilirubin Total: 0.5 mg/dL (ref 0.0–1.2)
CO2: 22 mmol/L (ref 20–29)
Calcium: 9.5 mg/dL (ref 8.6–10.2)
Chloride: 102 mmol/L (ref 96–106)
Creatinine, Ser: 1.13 mg/dL (ref 0.76–1.27)
Globulin, Total: 2.3 g/dL (ref 1.5–4.5)
Glucose: 114 mg/dL — ABNORMAL HIGH (ref 70–99)
Potassium: 4.8 mmol/L (ref 3.5–5.2)
Sodium: 140 mmol/L (ref 134–144)
Total Protein: 6.6 g/dL (ref 6.0–8.5)
eGFR: 68 mL/min/1.73

## 2023-09-30 LAB — TSH: TSH: 3.95 u[IU]/mL (ref 0.450–4.500)

## 2023-10-02 NOTE — Progress Notes (Signed)
Remote ICD transmission.   

## 2023-10-24 ENCOUNTER — Telehealth: Payer: Self-pay

## 2023-10-24 ENCOUNTER — Other Ambulatory Visit (HOSPITAL_COMMUNITY): Payer: Self-pay

## 2023-10-24 DIAGNOSIS — R748 Abnormal levels of other serum enzymes: Secondary | ICD-10-CM

## 2023-10-24 NOTE — Telephone Encounter (Signed)
Attempted phone call to pt and left voicemail message to contact RN at [phone].  Order placed for repeat CMET.

## 2023-10-24 NOTE — Telephone Encounter (Signed)
-----   Message from Sherryl Manges sent at 10/23/2023 10:47 AM EST ----- Please Inform Patient   Labs are normal x mild elevation in liver tests,  may be related to the higher doses of amiodarone, so lets recheck them in about 4 weeks    Thanks

## 2023-11-06 ENCOUNTER — Ambulatory Visit: Payer: Medicare PPO | Admitting: Family Medicine

## 2023-11-06 ENCOUNTER — Encounter: Payer: Self-pay | Admitting: Family Medicine

## 2023-11-06 VITALS — BP 114/60 | HR 75 | Temp 98.0°F | Ht 68.0 in | Wt 176.2 lb

## 2023-11-06 DIAGNOSIS — Z7984 Long term (current) use of oral hypoglycemic drugs: Secondary | ICD-10-CM | POA: Diagnosis not present

## 2023-11-06 DIAGNOSIS — E1165 Type 2 diabetes mellitus with hyperglycemia: Secondary | ICD-10-CM

## 2023-11-06 DIAGNOSIS — H9313 Tinnitus, bilateral: Secondary | ICD-10-CM

## 2023-11-06 DIAGNOSIS — E785 Hyperlipidemia, unspecified: Secondary | ICD-10-CM

## 2023-11-06 DIAGNOSIS — I472 Ventricular tachycardia, unspecified: Secondary | ICD-10-CM

## 2023-11-06 DIAGNOSIS — N529 Male erectile dysfunction, unspecified: Secondary | ICD-10-CM | POA: Diagnosis not present

## 2023-11-06 DIAGNOSIS — I10 Essential (primary) hypertension: Secondary | ICD-10-CM | POA: Diagnosis not present

## 2023-11-06 LAB — LIPID PANEL
Cholesterol: 144 mg/dL (ref 0–200)
HDL: 35.8 mg/dL — ABNORMAL LOW (ref >39.00)
LDL Cholesterol: 62 mg/dL (ref 0–99)
NonHDL: 108.68
Total CHOL/HDL Ratio: 4
Triglycerides: 235 mg/dL — ABNORMAL HIGH (ref 0.0–149.0)
VLDL: 47 mg/dL — ABNORMAL HIGH (ref 0.0–40.0)

## 2023-11-06 LAB — COMPREHENSIVE METABOLIC PANEL
ALT: 67 U/L — ABNORMAL HIGH (ref 0–53)
AST: 75 U/L — ABNORMAL HIGH (ref 0–37)
Albumin: 4.4 g/dL (ref 3.5–5.2)
Alkaline Phosphatase: 56 U/L (ref 39–117)
BUN: 19 mg/dL (ref 6–23)
CO2: 24 meq/L (ref 19–32)
Calcium: 9.5 mg/dL (ref 8.4–10.5)
Chloride: 102 meq/L (ref 96–112)
Creatinine, Ser: 1 mg/dL (ref 0.40–1.50)
GFR: 74.07 mL/min (ref >60.00)
Glucose, Bld: 129 mg/dL — ABNORMAL HIGH (ref 70–99)
Potassium: 4.1 meq/L (ref 3.5–5.1)
Sodium: 140 meq/L (ref 135–145)
Total Bilirubin: 0.8 mg/dL (ref 0.2–1.2)
Total Protein: 7.1 g/dL (ref 6.0–8.3)

## 2023-11-06 LAB — MICROALBUMIN / CREATININE URINE RATIO
Creatinine,U: 69.2 mg/dL
Microalb Creat Ratio: 1 mg/g (ref 0.0–30.0)
Microalb, Ur: <0.7 mg/dL (ref 0.0–1.9)

## 2023-11-06 LAB — HEMOGLOBIN A1C: Hgb A1c MFr Bld: 6.4 % (ref 4.6–6.5)

## 2023-11-06 MED ORDER — OLMESARTAN MEDOXOMIL-HCTZ 20-12.5 MG PO TABS: 1.0000 | ORAL_TABLET | Freq: Every day | ORAL | 2 refills | Status: DC

## 2023-11-06 MED ORDER — ATORVASTATIN CALCIUM 10 MG PO TABS: 10.0000 mg | ORAL_TABLET | Freq: Every day | ORAL | 2 refills | Status: DC

## 2023-11-06 MED ORDER — FREESTYLE LIBRE 3 SENSOR MISC: 1.0000 | 1 refills | Status: AC

## 2023-11-06 MED ORDER — EMPAGLIFLOZIN 25 MG PO TABS: 25.0000 mg | ORAL_TABLET | Freq: Every day | ORAL | 1 refills | Status: DC

## 2023-11-06 NOTE — Progress Notes (Signed)
Subjective:  Patient ID: Dakota Gutierrez, male    DOB: Jun 03, 1951  Age: 72 y.o. MRN: [id number]  CC:  Chief Complaint  Patient presents with   Medical Management of Chronic Issues    Pt is doing okay but notes since starting blood thinner he can't get blood from his fingers but other cuts will bleed without clotting. Wasn't sure of a CGM would be better.     HPI [name] presents for   Diabetes: With hyperglycemia but improved control in April.  Taking metformin 500 mg, Jardiance 25 mg daily.  Denies new urinary infection symptoms.  He is on statin and ARB.lipitor 10mg  every day without new myalgia/side effects.  Some difficulty with checking blood sugars with fingerstick readings.  No diarrhea on current dose metformin - has experience with higher doses.  110-135. No 200's or low blood sugar symptoms.  Genital rash - penile, scrotal area in September, October. Treated with otc antifungal - clear now.  Microalbumin: 11/17/22 - normal. Optho, foot exam, pneumovax: optho - due, plans to call and schedule this month.  Covid booster - recommended at pharmacy. Did not want to combine with flu vaccine month ago at pharmacy.   Lab Results  Component Value Date   HGBA1C 6.9 (H) 03/22/2023   HGBA1C 8.4 (H) 11/17/2022   HGBA1C 8.2 (H) 11/25/2021   Lab Results  Component Value Date   MICROALBUR 0.9 11/17/2022   LDLCALC 34 02/05/2023   CREATININE 1.13 09/29/2023   Cardiac Followed by electrophysiology, Dr. Graciela Husbands, with history of ventricular tachycardia Last visit November 1. Hospitalized in March for VT storm treated with amiodarone and adjunctive lidocaine.  Catheterization indicated significantly worsening coronary artery disease and three-vessel involvement, undergoing stepwise intervention with initial stenting of LAD complicated by V. tach and then subsequent stenting of mid RCA lesion. Due to recurrent V. tach despite amiodarone and referred to Dr. Berneice Gandy, with  catheter ablation September 24 without subsequent V. tach.  He was feeling significantly better at his November 1 visit, no edema, dyspnea was improving and no lightheadedness although blood pressure was slightly low at 92/57.  He was continued on amiodarone 200 mg a day with plan for down titration over the next 6 months.  Metoprolol was decreased to 100 mg a day and ranolazine was stopped earlier that week at [facility].  Plan for dual antiplatelet therapy for 1 year per cardiology, Dr. Allyson Sabal.  Cardiac cath in July.  On benicar hct., toprol for HTN.  Some persistent shortness of breath, better than in past. Advised would take few months after procedure to see how he feels.  BP Readings from Last 3 Encounters:  11/06/23 114/60  09/29/23 (!) 92/57  06/09/23 (!) 110/57   Elevated LFT's: Possibly d/t amiodarone per pt after discussion with Dr. Graciela Husbands. Same dose amiodarone for now. Appt in January with Dr. Graciela Husbands.  No jaundice, scleral icterus, abd pain, n/v. Tylenol once per day.   Tinnitus Intermittent when discussed at his April visit.  Option to meet with specialist if persistent. He was evaluated by ENT in July, and subsequently with audiology, note from August 30 indicating sensorineural hearing loss -Bilateral.  Hearing aids discussed. Cost prohibitive.  Still some ringing in ears. No SI, some impact on sleep at times. Using background noise - fan.   Adjustment d/o - feels better. Met with therapist.     11/06/2023   10:09 AM 04/19/2023    1:54 PM 03/22/2023   11:28 AM 11/17/2022  11:04 AM 03/03/2022    1:12 PM  Depression screen PHQ 2/9  Decreased Interest 0 0 0 0 0  Down, Depressed, Hopeless 0 0 0 0 0  PHQ - 2 Score 0 0 0 0 0  Altered sleeping 1 2 0 0   Tired, decreased energy 1 2 1  0   Change in appetite 0 0 0 0   Feeling bad or failure about yourself  0 0 0 0   Trouble concentrating 0 1 0 0   Moving slowly or fidgety/restless 0 0 0 0   Suicidal thoughts 0 0 0 0   PHQ-9 Score 2 5 1  0    Difficult doing work/chores  Somewhat difficult Not difficult at all         History Patient Active Problem List   Diagnosis Date Noted   Non-sustained ventricular tachycardia (HCC) 02/05/2023   VT (ventricular tachycardia) (HCC) 02/05/2023   Sinus node dysfunction (HCC) 01/27/2023   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   Posterior vitreous detachment, both eyes 02/15/2021   Optic atrophy, 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   Hypertension associated with type 2 diabetes mellitus (HCC) 11/14/2019   Type II diabetes mellitus (HCC) 01/16/2015   Hypersomnolence 08/14/2012   History of Helicobacter pylori infection 04/16/2012   GERD (gastroesophageal reflux disease) 04/16/2012   Elevated transaminase level 02/02/2012   Obesity 12/29/2011   Dual implantable cardioverter-defibrillator - Medtronic 09/06/2011   Syncope    Ventricular tachycardia (HCC)    Anxiety    Past Medical History:  Diagnosis Date   Anxiety    secondary to ICD shocks   Diabetes mellitus without complication (HCC)    Dual implantable cardiac defibrillator in situ    Medtronic [id number] Protecta   Elevated transaminase level    10/2011  ? 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   Past Surgical History:  Procedure Laterality Date   CARDIAC DEFIBRILLATOR PLACEMENT     Medtronic [id number] Protecta   CATARACT EXTRACTION, BILATERAL  2019   CHOLECYSTECTOMY     COLONOSCOPY WITH PROPOFOL N/A 06/30/2017   Procedure: COLONOSCOPY WITH PROPOFOL;  Surgeon: Jeani Hawking, MD;  Location: [facility];  Service: Endoscopy;  Laterality: N/A;   CORONARY PRESSURE/FFR STUDY N/A 02/06/2023   Procedure: INTRAVASCULAR PRESSURE WIRE/FFR  STUDY;  Surgeon: Runell Gess, MD;  Location: [facility] INVASIVE CV LAB;  Service: Cardiovascular;  Laterality: N/A;   CORONARY STENT INTERVENTION N/A 02/06/2023   Procedure: CORONARY STENT INTERVENTION;  Surgeon: Runell Gess, MD;  Location: [facility] INVASIVE CV LAB;  Service: Cardiovascular;  Laterality: N/A;   CORONARY STENT INTERVENTION N/A 02/08/2023   Procedure: CORONARY STENT INTERVENTION;  Surgeon: Tonny Bollman, MD;  Location: [facility] INVASIVE CV LAB;  Service: Cardiovascular;  Laterality: N/A;   GALLBLADDER SURGERY     HERNIA REPAIR     ICD GENERATOR CHANGEOUT N/A 04/26/2017   Procedure: ICD Generator Changeout;  Surgeon: Duke Salvia, MD;  Location: [facility] INVASIVE CV LAB;  Service: Cardiovascular;  Laterality: N/A;   LEFT HEART CATH AND CORONARY ANGIOGRAPHY N/A 02/06/2023   Procedure: LEFT HEART CATH AND CORONARY ANGIOGRAPHY;  Surgeon: Runell Gess, MD;  Location: [facility] INVASIVE CV  LAB;  Service: Cardiovascular;  Laterality: N/A;   LEFT HEART CATH AND CORONARY ANGIOGRAPHY N/A 06/09/2023   Procedure: LEFT HEART CATH AND CORONARY ANGIOGRAPHY;  Surgeon: Swaziland, Peter M, MD;  Location: [facility] INVASIVE CV LAB;  Service: Cardiovascular;  Laterality: N/A;   TONSILLECTOMY AND ADENOIDECTOMY     Allergies  Allergen Reactions   Adhesive [Tape]     Swelling and welts formed   Prior to Admission medications   Medication Sig Start Date End Date Taking? Authorizing Provider  acetaminophen (TYLENOL) 500 MG tablet Take 1,000 mg by mouth daily as needed for moderate pain or headache.   Yes [provider]  ALPRAZolam (XANAX) 0.25 MG tablet Take 1 tablet (0.25 mg total) by mouth 2 (two) times daily as needed for anxiety. 02/09/23  Yes Graciella Freer, PA-C  amiodarone (PACERONE) 200 MG tablet Take 1 tablet (200 mg total) by mouth daily. 09/29/23  Yes Duke Salvia, MD  aspirin EC 81 MG tablet Take 81 mg by mouth daily at 12 noon. 11/11/11  Yes Duke Salvia, MD  atorvastatin (LIPITOR) 10 MG  tablet TAKE 1 TABLET BY MOUTH EVERY DAY 07/20/23  Yes Shade Flood, MD  Cholecalciferol (VITAMIN D) 2000 UNITS tablet Take 2,000 Units by mouth daily at 12 noon.   Yes [provider]  empagliflozin (JARDIANCE) 25 MG TABS tablet Take 1 tablet (25 mg total) by mouth daily before breakfast. 09/06/23  Yes Shade Flood, MD  famotidine (PEPCID) 20 MG tablet Take 1 tablet (20 mg total) by mouth 2 (two) times daily as needed. 04/16/12  Yes Pyrtle, Carie Caddy, MD  metFORMIN (GLUCOPHAGE) 500 MG tablet Take 1 tablet (500 mg total) by mouth daily with breakfast. 06/12/23  Yes [place], Peter M, MD  metoprolol succinate (TOPROL-XL) 100 MG 24 hr tablet Take 1 tablet (100 mg total) by mouth every morning. 09/29/23  Yes Duke Salvia, MD  olmesartan-hydrochlorothiazide (BENICAR HCT) 20-12.5 MG tablet TAKE 1 TABLET BY MOUTH EVERY DAY 07/20/23  Yes Shade Flood, MD  Omega-3 Fatty Acids (FISH OIL) 1000 MG CAPS Take 1,000 mg by mouth 2 (two) times daily.   Yes [provider]  tadalafil (CIALIS) 20 MG tablet Take 0.5-1 tablets (10-20 mg total) by mouth daily as needed for erectile dysfunction. 09/15/16  Yes Shade Flood, MD  ticagrelor (BRILINTA) 90 MG TABS tablet Take 1 tablet (90 mg total) by mouth 2 (two) times daily. 05/04/23  Yes Sheilah Pigeon, PA-C   Social History   Socioeconomic History   Marital status: Married    Spouse name: Not on file   Number of children: 0   Years of education: Not on file   Highest education level: Bachelor's degree (e.g., BA, AB, BS)  Occupational History   Occupation: Retired Runner, broadcasting/film/video  Tobacco Use   Smoking status: Never   Smokeless tobacco: Never  Vaping Use   Vaping status: Never Used  Substance and Sexual Activity   Alcohol use: Yes    Alcohol/week: 2.0 standard drinks of alcohol    Types: 2 Cans of beer per week    Comment: 4/week   Drug use: No   Sexual activity: Yes  Other Topics Concern   Not on file  Social History Narrative    Divorced. Education: [employer]. Exercise: walking 3 times a week for 30 minutes.   Retired Manufacturing engineer Strain: Low Risk  (03/03/2022)   Overall Physicist, medical Strain (  CARDIA)    Difficulty of Paying Living Expenses: Not hard at all  Food Insecurity: No Food Insecurity (04/19/2023)   Hunger Vital Sign    Worried About Running Out of Food in the Last Year: Never true    Ran Out of Food in the Last Year: Never true  Transportation Needs: No Transportation Needs (04/19/2023)   PRAPARE - Administrator, Civil Service (Medical): No    Lack of Transportation (Non-Medical): No  Physical Activity: Insufficiently Active (04/19/2023)   Exercise Vital Sign    Days of Exercise per Week: 4 days    Minutes of Exercise per Session: 30 min  Stress: No Stress Concern Present (03/03/2022)   [employer] of Occupational Health - Occupational Stress Questionnaire    Feeling of Stress : Not at all  Social Connections: Moderately Isolated (04/19/2023)   Social Connection and Isolation Panel [NHANES]    Frequency of Communication with Friends and Family: More than three times a week    Frequency of Social Gatherings with Friends and Family: Three times a week    Attends Religious Services: More than 4 times per year    Active Member of Clubs or Organizations: No    Attends Banker Meetings: Never    Marital Status: Divorced  Catering manager Violence: Not At Risk (04/19/2023)   Humiliation, Afraid, Rape, and Kick questionnaire    Fear of Current or Ex-Partner: No    Emotionally Abused: No    Physically Abused: No    Sexually Abused: No    Review of Systems Per HPI No new CP or new dyspnea.   Objective:   Vitals:   11/06/23 1011  BP: 114/60  Pulse: 75  Temp: 98 F (36.7 C)  TempSrc: Temporal  SpO2: 98%  Weight: 176 lb 3.2 oz (79.9 kg)  Height: 5\' 8"  (1.727 m)     Physical Exam Vitals reviewed.  Constitutional:       Appearance: He is well-developed.  HENT:     Head: Normocephalic and atraumatic.  Neck:     Vascular: No carotid bruit or JVD.  Cardiovascular:     Rate and Rhythm: Normal rate and regular rhythm.     Heart sounds: Normal heart sounds. No murmur heard. Pulmonary:     Effort: Pulmonary effort is normal.     Breath sounds: Normal breath sounds. No rales.  Musculoskeletal:     Right lower leg: No edema.     Left lower leg: No edema.  Skin:    General: Skin is warm and dry.  Neurological:     Mental Status: He is alert and oriented to person, place, and time.  Psychiatric:        Mood and Affect: Mood normal.     43 minutes spent during visit, including chart review, review of treatment for ventricular tachycardia, discussion of diabetes and medication potential side effects, Counseling and assimilation of information, exam, discussion of plan, and chart completion.     Assessment & Plan:  ABBY DENIS is a 72 y.o. male . Type 2 diabetes mellitus with hyperglycemia, without long-term current use of insulin (HCC) - Plan: Comprehensive metabolic panel, Hemoglobin A1c, Lipid panel, Microalbumin / creatinine urine ratio, Continuous Glucose Sensor (FREESTYLE LIBRE 3 SENSOR) MISC, atorvastatin (LIPITOR) 10 MG tablet, empagliflozin (JARDIANCE) 25 MG TABS tablet  -Check labs and adjust plan accordingly, no med changes for now.  Continuous glucose monitor sample and prescription given to see if that is easier  than fingerstick testing.  Suspected mycotic infection as above but now resolved.  If recurrent symptoms would stop SGLT2 and look at other agents.  RTC precautions given.  Ventricular tachycardia (HCC)  -Treated and followed by cardiology, electrophysiology as above.  Regular rhythm in office.  Asymptomatic.  Hyperlipidemia, unspecified hyperlipidemia type - Plan: atorvastatin (LIPITOR) 10 MG tablet  -Tolerating current regimen, check labs, continue same dose  Lipitor.  Essential hypertension - Plan: olmesartan-hydrochlorothiazide (BENICAR HCT) 20-12.5 MG tablet  -Tolerating current dose meds, stable control, continue same with labs as above, adjust plan accordingly.  Erectile dysfunction, unspecified erectile dysfunction type  -No med changes at this time.  Meds ordered this encounter  Medications   Continuous Glucose Sensor (FREESTYLE LIBRE 3 SENSOR) MISC    Sig: 1 Application by Does not apply route every 14 (fourteen) days. Place 1 sensor on the skin every 14 days. Use to check glucose continuously    Dispense:  6 each    Refill:  1   atorvastatin (LIPITOR) 10 MG tablet    Sig: Take 1 tablet (10 mg total) by mouth daily.    Dispense:  90 tablet    Refill:  2   empagliflozin (JARDIANCE) 25 MG TABS tablet    Sig: Take 1 tablet (25 mg total) by mouth daily before breakfast.    Dispense:  90 tablet    Refill:  1   olmesartan-hydrochlorothiazide (BENICAR HCT) 20-12.5 MG tablet    Sig: Take 1 tablet by mouth daily.    Dispense:  90 tablet    Refill:  2   Patient Instructions  If any recurrence of yeast infection symptoms, let me know but if that occurs may need to stop Jardiance. No changes for now.   We can try continuous glucose monitor if that is covered.  I will check labs and let you know if concerns.  Keep follow up with specialists as planned.   Return to the clinic or go to the nearest emergency room if any of your symptoms worsen or new symptoms occur.  Take care!     Signed,   Meredith Staggers, MD [facility], [facility] 11/06/23 10:59 AM

## 2023-11-06 NOTE — Patient Instructions (Addendum)
If any recurrence of yeast infection symptoms, let me know but if that occurs may need to stop Jardiance. No changes for now.   We can try continuous glucose monitor if that is covered.  I will check labs and let you know if concerns.  Keep follow up with specialists as planned.   Return to the clinic or go to the nearest emergency room if any of your symptoms worsen or new symptoms occur.  Take care!

## 2023-11-30 ENCOUNTER — Other Ambulatory Visit (HOSPITAL_COMMUNITY): Payer: Self-pay

## 2023-11-30 ENCOUNTER — Telehealth: Payer: Self-pay | Admitting: Pharmacy Technician

## 2023-11-30 NOTE — Telephone Encounter (Signed)
 Pharmacy Patient Advocate Encounter   Received notification from CoverMyMeds that prior authorization for FreeStyle Libre 3 Plus Sensor is required/requested.   Insurance verification completed.   The patient is insured through [employer] .   Per test claim: PA required; PA submitted to above mentioned insurance via CoverMyMeds Key/confirmation #/EOC [facility] Status is pending

## 2023-12-01 NOTE — Telephone Encounter (Signed)
 Pharmacy Patient Advocate Encounter  Received notification from [employer] that Prior Authorization for FreeStyle Libre 3 Plus Sensor has been DENIED.  Full denial letter will be uploaded to the media tab. See denial reason below.   PA #/Case ID/Reference #: [id number]   Denial Reason: Patient must have showed documentation that she has 1-3 cases of hypoglycemia or hypergylcemia.

## 2023-12-05 NOTE — Telephone Encounter (Signed)
 Pt has been notified.

## 2023-12-05 NOTE — Telephone Encounter (Signed)
 Noted. Unfortunately it appears coverage only if hypo or hyperglycemia.

## 2023-12-13 ENCOUNTER — Ambulatory Visit (INDEPENDENT_AMBULATORY_CARE_PROVIDER_SITE_OTHER): Payer: Medicare PPO

## 2023-12-13 DIAGNOSIS — I472 Ventricular tachycardia, unspecified: Secondary | ICD-10-CM

## 2023-12-13 LAB — CUP PACEART REMOTE DEVICE CHECK
Battery Remaining Longevity: 14 mo
Battery Voltage: 2.91 V
Brady Statistic AP VP Percent: 0.05 %
Brady Statistic AP VS Percent: 98.37 %
Brady Statistic AS VP Percent: 0 %
Brady Statistic AS VS Percent: 1.57 %
Brady Statistic RA Percent Paced: 96.82 %
Brady Statistic RV Percent Paced: 0.06 %
Date Time Interrogation Session: 20250115043828
HighPow Impedance: 40 Ohm
HighPow Impedance: 48 Ohm
Implantable Lead Connection Status: 753985
Implantable Lead Connection Status: 753985
Implantable Lead Implant Date: 20120531
Implantable Lead Implant Date: 20120531
Implantable Lead Location: 753859
Implantable Lead Location: 753860
Implantable Lead Model: 4076
Implantable Lead Model: 6947
Implantable Pulse Generator Implant Date: 20180530
Lead Channel Impedance Value: 304 Ohm
Lead Channel Impedance Value: 361 Ohm
Lead Channel Impedance Value: 456 Ohm
Lead Channel Pacing Threshold Amplitude: 0.75 V
Lead Channel Pacing Threshold Amplitude: 0.75 V
Lead Channel Pacing Threshold Pulse Width: 0.4 ms
Lead Channel Pacing Threshold Pulse Width: 0.4 ms
Lead Channel Sensing Intrinsic Amplitude: 0.875 mV
Lead Channel Sensing Intrinsic Amplitude: 0.875 mV
Lead Channel Sensing Intrinsic Amplitude: 11.375 mV
Lead Channel Sensing Intrinsic Amplitude: 11.375 mV
Lead Channel Setting Pacing Amplitude: 1.5 V
Lead Channel Setting Pacing Amplitude: 2 V
Lead Channel Setting Pacing Pulse Width: 0.4 ms
Lead Channel Setting Sensing Sensitivity: 0.3 mV
Zone Setting Status: 755011
Zone Setting Status: 755011

## 2024-01-21 ENCOUNTER — Other Ambulatory Visit: Payer: Self-pay | Admitting: Family Medicine

## 2024-01-21 DIAGNOSIS — E1165 Type 2 diabetes mellitus with hyperglycemia: Secondary | ICD-10-CM

## 2024-01-22 ENCOUNTER — Other Ambulatory Visit (HOSPITAL_COMMUNITY): Payer: Self-pay

## 2024-01-22 MED ORDER — METFORMIN HCL 500 MG PO TABS: 500.0000 mg | ORAL_TABLET | Freq: Every day | ORAL | 1 refills | Status: DC

## 2024-01-22 NOTE — Addendum Note (Signed)
 Addended by: Eldred Manges on: 01/22/2024 09:21 AM   Modules accepted: Orders

## 2024-01-23 NOTE — Progress Notes (Signed)
 Remote ICD transmission.

## 2024-01-24 ENCOUNTER — Encounter: Payer: Self-pay | Admitting: Internal Medicine

## 2024-02-05 ENCOUNTER — Other Ambulatory Visit: Payer: Self-pay | Admitting: Student

## 2024-02-05 DIAGNOSIS — I472 Ventricular tachycardia, unspecified: Secondary | ICD-10-CM

## 2024-03-13 ENCOUNTER — Ambulatory Visit (INDEPENDENT_AMBULATORY_CARE_PROVIDER_SITE_OTHER): Payer: Medicare PPO

## 2024-03-13 DIAGNOSIS — I428 Other cardiomyopathies: Secondary | ICD-10-CM | POA: Diagnosis not present

## 2024-03-14 LAB — CUP PACEART REMOTE DEVICE CHECK
Battery Remaining Longevity: 11 mo
Battery Voltage: 2.9 V
Brady Statistic AP VP Percent: 0.07 %
Brady Statistic AP VS Percent: 98.84 %
Brady Statistic AS VP Percent: 0 %
Brady Statistic AS VS Percent: 1.08 %
Brady Statistic RA Percent Paced: 98.61 %
Brady Statistic RV Percent Paced: 0.08 %
Date Time Interrogation Session: 20250416033524
HighPow Impedance: 38 Ohm
HighPow Impedance: 47 Ohm
Implantable Lead Connection Status: 753985
Implantable Lead Connection Status: 753985
Implantable Lead Implant Date: 20120531
Implantable Lead Implant Date: 20120531
Implantable Lead Location: 753859
Implantable Lead Location: 753860
Implantable Lead Model: 4076
Implantable Lead Model: 6947
Implantable Pulse Generator Implant Date: 20180530
Lead Channel Impedance Value: 304 Ohm
Lead Channel Impedance Value: 361 Ohm
Lead Channel Impedance Value: 456 Ohm
Lead Channel Pacing Threshold Amplitude: 0.75 V
Lead Channel Pacing Threshold Amplitude: 0.75 V
Lead Channel Pacing Threshold Pulse Width: 0.4 ms
Lead Channel Pacing Threshold Pulse Width: 0.4 ms
Lead Channel Sensing Intrinsic Amplitude: 1.125 mV
Lead Channel Sensing Intrinsic Amplitude: 1.125 mV
Lead Channel Sensing Intrinsic Amplitude: 9.5 mV
Lead Channel Sensing Intrinsic Amplitude: 9.5 mV
Lead Channel Setting Pacing Amplitude: 1.5 V
Lead Channel Setting Pacing Amplitude: 2 V
Lead Channel Setting Pacing Pulse Width: 0.4 ms
Lead Channel Setting Sensing Sensitivity: 0.3 mV
Zone Setting Status: 755011
Zone Setting Status: 755011

## 2024-03-20 ENCOUNTER — Encounter: Payer: Self-pay | Admitting: Internal Medicine

## 2024-03-20 ENCOUNTER — Ambulatory Visit: Payer: Medicare PPO | Attending: Internal Medicine | Admitting: Internal Medicine

## 2024-03-20 VITALS — BP 130/78 | HR 76 | Ht 68.0 in | Wt 178.0 lb

## 2024-03-20 DIAGNOSIS — I495 Sick sinus syndrome: Secondary | ICD-10-CM | POA: Diagnosis not present

## 2024-03-20 DIAGNOSIS — Z79899 Other long term (current) drug therapy: Secondary | ICD-10-CM | POA: Diagnosis not present

## 2024-03-20 DIAGNOSIS — T462X1D Poisoning by other antidysrhythmic drugs, accidental (unintentional), subsequent encounter: Secondary | ICD-10-CM

## 2024-03-20 DIAGNOSIS — Z95810 Presence of automatic (implantable) cardiac defibrillator: Secondary | ICD-10-CM

## 2024-03-20 DIAGNOSIS — I472 Ventricular tachycardia, unspecified: Secondary | ICD-10-CM

## 2024-03-20 DIAGNOSIS — T462X1A Poisoning by other antidysrhythmic drugs, accidental (unintentional), initial encounter: Secondary | ICD-10-CM

## 2024-03-20 MED ORDER — AMIODARONE HCL 200 MG PO TABS: ORAL_TABLET | ORAL | Status: DC

## 2024-03-20 NOTE — Patient Instructions (Addendum)
 Medication Instructions:  Your physician has recommended you make the following change in your medication:   ** Decrease Amiodarone  200mg  to 5 days per week  *If you need a refill on your cardiac medications before your next appointment, please call your pharmacy*  Lab Work: LFT 1 week prior to appointment with Michaelle Adolphus, PA-C  Testing/Procedures: High Resolution Chest CT Non-Cardiac CT scanning, (CAT scanning), is a noninvasive, special x-ray that produces cross-sectional images of the body using x-rays and a computer. CT scans help physicians diagnose and treat medical conditions. For some CT exams, a contrast material is used to enhance visibility in the area of the body being studied. CT scans provide greater clarity and reveal more details than regular x-ray exams.   Your physician has recommended that you have a pulmonary function test. Pulmonary Function Tests are a group of tests that measure how well air moves in and out of your lungs.   If you have labs (blood work) drawn today and your tests are completely normal, you will receive your results only by: MyChart Message (if you have MyChart) OR A paper copy in the mail If you have any lab test that is abnormal or we need to change your treatment, we will call you to review the results.  Follow-Up: At [facility], you and your health needs are our priority.  As part of our continuing mission to provide you with exceptional heart care, our providers are all part of one team.  This team includes your primary Cardiologist (physician) and Advanced Practice Providers or APPs (Physician Assistants and Nurse Practitioners) who all work together to provide you with the care you need, when you need it.  Your next appointment:   2 months with Michaelle Adolphus, PA_C      1st Floor: - Lobby - Registration  - Pharmacy  - Lab - Cafe  2nd Floor: - PV Lab - Diagnostic Testing (echo, CT, nuclear med)  3rd Floor: -  Vacant  4th Floor: - TCTS (cardiothoracic surgery) - AFib Clinic - Structural Heart Clinic - Vascular Surgery  - Vascular Ultrasound  5th Floor: - HeartCare Cardiology (general and EP) - Clinical Pharmacy for coumadin, hypertension, lipid, weight-loss medications, and med management appointments    Valet parking services will be available as well.

## 2024-03-20 NOTE — Progress Notes (Signed)
 Patient Care Team: Benjiman Bras, MD as PCP - General (Family Medicine) Verona Goodwill, MD as PCP - Electrophysiology (Cardiology) Verona Goodwill, MD (Cardiology) Debbra Fairy, MD as Attending Physician (Neurology)   HPI  Dakota Gutierrez is a 73 y.o. 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 Unfortunately, in the days that followed he developed VT storm with multiple shocks and was treated with amiodarone  which has been gradually down titrated to 100 mg a day.  Amiodarone  surveillance had demonstrated elevated liver function tests spring 2013 . We elected to discontinue the amiodarone  albeit with some reluctance on the part of the family. When seen in June 2022 of recurrent episodes of ventricular tachycardia 2 of which required therapy. He was then resumed on amiodarone  and referred for consultation with GI  most recently 4/17 transaminases were normal.     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  He underwent catheter because of the response to lidocaine , he was found to have significantly worsening coronary artery disease with three-vessel involvement and underwent stepwise intervention with initially stenting of the LAD complicated by ventricular tachycardia and then subsequently by stenting of a mid RCA lesion.  4/24 Interval slow ventricular tachycardia below detection that terminated spontaneously, cycle length varied from 530--450 ms.  7/24  recurrent ventricular tachycardia despite amiodarone .  Referred to Dr  Fausto Hooker underwent catheter ablation 9/24 without subsequent ventricular tachycardia  Occasional chest pains.  Sharp stabbing for about 10 seconds also has aches that last 1-2 hours.  Unassociated with effort.  Often while watching television.  Dyspnea on exertion.    Date Cr K Hgb TSH  ALT   5/18    3.84 25    3/19    3.73 27  8/19    3.4 24  3/20    3.25   12/20 0.97 4.4   21  }6/21 0.98 4.3   18  11/23 1.01 4.5 14.5    3/24 1.28 4.0 15.1 4.9 35  7/24 1.07 4.5 14.2 3.81 34  12/24 1.0 4.1  3.95 67   DATE TEST EF   2012 MYOVIEW 56% No Ischemia  5/17 Echo   45-50 %   11/23 Echo  55-60%   3/24 Echo  55-60%   3/24 [facility]  LADp-m 75 +FFR>stent; RCAm-99>>stent; AM-95;RPLV100           Past Medical History:  Diagnosis Date   Anxiety    secondary to ICD shocks   Diabetes mellitus without complication (HCC)    Dual implantable cardiac defibrillator in situ    Medtronic [id number] Protecta   Elevated transaminase level    10/2011  ? 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   Vitreomacular adhesion of right eye 02/15/2021    Past Surgical History:  Procedure Laterality Date   CARDIAC DEFIBRILLATOR PLACEMENT  Medtronic [id number] Protecta   CATARACT EXTRACTION, BILATERAL  2019   CHOLECYSTECTOMY     COLONOSCOPY WITH PROPOFOL  N/A 06/30/2017   Procedure: COLONOSCOPY WITH PROPOFOL ;  Surgeon: Alvis Jourdain, MD;  Location: [facility];  Service: Endoscopy;  Laterality: N/A;   CORONARY PRESSURE/FFR STUDY N/A 02/06/2023   Procedure: INTRAVASCULAR PRESSURE WIRE/FFR STUDY;  Surgeon: Avanell Leigh, MD;  Location: [facility] INVASIVE CV LAB;  Service: Cardiovascular;  Laterality: N/A;   CORONARY STENT INTERVENTION N/A 02/06/2023   Procedure: CORONARY STENT INTERVENTION;   Surgeon: Avanell Leigh, MD;  Location: [facility] INVASIVE CV LAB;  Service: Cardiovascular;  Laterality: N/A;   CORONARY STENT INTERVENTION N/A 02/08/2023   Procedure: CORONARY STENT INTERVENTION;  Surgeon: Arnoldo Lapping, MD;  Location: [facility] INVASIVE CV LAB;  Service: Cardiovascular;  Laterality: N/A;   GALLBLADDER SURGERY     HERNIA REPAIR     ICD GENERATOR CHANGEOUT N/A 04/26/2017   Procedure: ICD Generator Changeout;  Surgeon: Verona Goodwill, MD;  Location: [facility] INVASIVE CV LAB;  Service: Cardiovascular;  Laterality: N/A;   LEFT HEART CATH AND CORONARY ANGIOGRAPHY N/A 02/06/2023   Procedure: LEFT HEART CATH AND CORONARY ANGIOGRAPHY;  Surgeon: Avanell Leigh, MD;  Location: [facility] INVASIVE CV LAB;  Service: Cardiovascular;  Laterality: N/A;   LEFT HEART CATH AND CORONARY ANGIOGRAPHY N/A 06/09/2023   Procedure: LEFT HEART CATH AND CORONARY ANGIOGRAPHY;  Surgeon: Swazil[place], Peter M, MD;  Location: [facility] INVASIVE CV LAB;  Service: Cardiovascular;  Laterality: N/A;   TONSILLECTOMY AND ADENOIDECTOMY      Current Outpatient Medications  Medication Sig Dispense Refill   acetaminophen  (TYLENOL ) 500 MG tablet Take 1,000 mg by mouth daily as needed for moderate pain or headache.     ALPRAZolam  (XANAX ) 0.25 MG tablet Take 1 tablet (0.25 mg total) by mouth 2 (two) times daily as needed for anxiety. 30 tablet 1   amiodarone  (PACERONE ) 200 MG tablet Take 1 tablet (200 mg total) by mouth daily.     aspirin  EC 81 MG tablet Take 81 mg by mouth daily at 12 noon.     atorvastatin  (LIPITOR) 10 MG tablet Take 1 tablet (10 mg total) by mouth daily. 90 tablet 2   Cholecalciferol (VITAMIN D ) 2000 UNITS tablet Take 2,000 Units by mouth daily at 12 noon.     Continuous Glucose Sensor (FREESTYLE LIBRE 3 SENSOR) MISC 1 Application by Does not apply route every 14 (fourteen) days. Place 1 sensor on the skin every 14 days. Use to check glucose continuously 6 each 1   empagliflozin  (JARDIANCE ) 25 MG TABS tablet Take 1 tablet (25 mg  total) by mouth daily before breakfast. 90 tablet 1   famotidine  (PEPCID ) 20 MG tablet Take 1 tablet (20 mg total) by mouth 2 (two) times daily as needed. 60 tablet 4   metFORMIN  (GLUCOPHAGE ) 500 MG tablet Take 1 tablet (500 mg total) by mouth daily with breakfast. TAKE 1 TABLET BY MOUTH EVERY DAY WITH BREAKFAST 90 tablet 1   metoprolol  succinate (TOPROL -XL) 100 MG 24 hr tablet Take 1 tablet (100 mg total) by mouth every morning. 90 tablet 2   olmesartan -hydrochlorothiazide (BENICAR  HCT) 20-12.5 MG tablet Take 1 tablet by mouth daily. 90 tablet 2   Omega-3 Fatty Acids (FISH OIL) 1000 MG CAPS Take 1,000 mg by mouth 2 (two) times daily.     tadalafil  (CIALIS ) 20 MG tablet Take 0.5-1 tablets (10-20 mg total) by mouth daily as needed for erectile dysfunction. 10 tablet 11   ticagrelor  (BRILINTA ) 90 MG  TABS tablet Take 1 tablet (90 mg total) by mouth 2 (two) times daily. 180 tablet 3   No current facility-administered medications for this visit.    Allergies  Allergen Reactions   Adhesive [Tape]     Swelling and welts formed    Review of Systems negative except from HPI and PMH except for complaints of back pain and muscle pain  Physical Exam BP 130/78   Pulse 76   Ht 5\' 8"  (1.727 m)   Wt 178 lb (80.7 kg)   SpO2 98%   BMI 27.06 kg/m  Well developed and well nourished in no acute distress HENT normal Neck supple with JVP-flat Clear Device pocket well healed; without hematoma or erythema.  There is no tethering  Regular rate and rhythm, no  gallop No murmur Abd-soft with active BS No Clubbing cyanosis   edema Skin-warm and dry A & Oriented  Grossly normal sensory and motor function  ECG atrial pacing at 76 Intervals 23/11/42  Device function is normal. Programming changes NONE  See Paceart for details   PVCs noted    Assessment and  Plan  VT recurrent  ICD Medtronic    Sinus noded dysfunction   Amiodarone  rx   Hyper transaminasemia  Dyspnea  Ischemic heart  disease  PTSD  No interval ventricular tachycardia.  We will decrease his amiodarone  from 1400--1000 mg/week.  We will plan in about 2 months, AT-PA follow-up, to look at transaminases.  With his dyspnea, concerned about amiodarone  lung injury.  Will undertake PFTs with a DLCO as well as high-resolution CT scanning.  Suspect will need pulmonary referral.  Will also arrange general cardiology for CAD

## 2024-03-21 ENCOUNTER — Encounter: Payer: Self-pay | Admitting: Internal Medicine

## 2024-03-26 ENCOUNTER — Ambulatory Visit (HOSPITAL_COMMUNITY)
Admission: RE | Admit: 2024-03-26 | Discharge: 2024-03-26 | Disposition: A | Discharge status: Home / Self Care | Source: Ambulatory Visit | Attending: Internal Medicine | Admitting: Internal Medicine

## 2024-03-26 DIAGNOSIS — T462X1A Poisoning by other antidysrhythmic drugs, accidental (unintentional), initial encounter: Secondary | ICD-10-CM | POA: Insufficient documentation

## 2024-03-26 DIAGNOSIS — Z79899 Other long term (current) drug therapy: Secondary | ICD-10-CM | POA: Insufficient documentation

## 2024-03-26 LAB — PULMONARY FUNCTION TEST
DL/VA % pred: 99 %
DL/VA: 4.03 ml/min/mmHg/L
DLCO unc % pred: 90 %
DLCO unc: 20.01 ml/min/mmHg
FEF 25-75 Pre: 3.46 L/s
FEF2575-%Pred-Pre: 185 %
FEV1-%Pred-Pre: 110 %
FEV1-Pre: 2.85 L
FEV1FVC-%Pred-Pre: 117 %
FEV6-%Pred-Pre: 99 %
FEV6-Pre: 3.32 L
FEV6FVC-%Pred-Pre: 107 %
FVC-%Pred-Pre: 93 %
FVC-Pre: 3.35 L
Pre FEV1/FVC ratio: 85 %
Pre FEV6/FVC Ratio: 100 %
RV % pred: 91 %
RV: 2.15 L
TLC % pred: 89 %
TLC: 5.58 L

## 2024-03-27 ENCOUNTER — Ambulatory Visit (HOSPITAL_COMMUNITY)
Admission: RE | Admit: 2024-03-27 | Discharge: 2024-03-27 | Disposition: A | Discharge status: Home / Self Care | Source: Ambulatory Visit | Attending: Internal Medicine | Admitting: Internal Medicine

## 2024-03-27 DIAGNOSIS — J8410 Pulmonary fibrosis, unspecified: Secondary | ICD-10-CM | POA: Diagnosis not present

## 2024-03-27 DIAGNOSIS — J479 Bronchiectasis, uncomplicated: Secondary | ICD-10-CM | POA: Diagnosis not present

## 2024-03-27 DIAGNOSIS — Z79899 Other long term (current) drug therapy: Secondary | ICD-10-CM | POA: Diagnosis present

## 2024-03-27 DIAGNOSIS — T462X1A Poisoning by other antidysrhythmic drugs, accidental (unintentional), initial encounter: Secondary | ICD-10-CM | POA: Diagnosis present

## 2024-03-27 DIAGNOSIS — R918 Other nonspecific abnormal finding of lung field: Secondary | ICD-10-CM | POA: Diagnosis not present

## 2024-03-31 LAB — CUP PACEART INCLINIC DEVICE CHECK
Date Time Interrogation Session: 20250423165534
Implantable Lead Connection Status: 753985
Implantable Lead Connection Status: 753985
Implantable Lead Implant Date: 20120531
Implantable Lead Implant Date: 20120531
Implantable Lead Location: 753859
Implantable Lead Location: 753860
Implantable Lead Model: 4076
Implantable Lead Model: 6947
Implantable Pulse Generator Implant Date: 20180530

## 2024-04-19 ENCOUNTER — Other Ambulatory Visit: Payer: Self-pay | Admitting: Physician Assistant

## 2024-04-19 ENCOUNTER — Other Ambulatory Visit (HOSPITAL_COMMUNITY): Payer: Self-pay

## 2024-04-19 MED ORDER — TICAGRELOR 90 MG PO TABS
90.0000 mg | ORAL_TABLET | Freq: Two times a day (BID) | ORAL | 3 refills | Status: AC
Start: 2024-04-19 — End: ?
  Filled 2024-04-19: qty 180, 90d supply, fill #0
  Filled 2024-07-18 – 2024-07-22 (×2): qty 180, 90d supply, fill #1
  Filled 2024-10-18: qty 180, 90d supply, fill #2

## 2024-04-23 ENCOUNTER — Other Ambulatory Visit (HOSPITAL_COMMUNITY): Payer: Self-pay

## 2024-04-24 ENCOUNTER — Other Ambulatory Visit (HOSPITAL_COMMUNITY): Payer: Self-pay

## 2024-04-24 NOTE — Progress Notes (Signed)
 Remote ICD transmission.

## 2024-04-24 NOTE — Addendum Note (Signed)
 Addended by: Lott Rouleau A on: 04/24/2024 02:44 PM   Modules accepted: Orders

## 2024-04-30 ENCOUNTER — Ambulatory Visit: Payer: Self-pay

## 2024-04-30 DIAGNOSIS — R9389 Abnormal findings on diagnostic imaging of other specified body structures: Secondary | ICD-10-CM

## 2024-05-08 ENCOUNTER — Ambulatory Visit: Payer: Medicare PPO | Admitting: Family Medicine

## 2024-05-09 DIAGNOSIS — Z79899 Other long term (current) drug therapy: Secondary | ICD-10-CM | POA: Diagnosis not present

## 2024-05-09 DIAGNOSIS — T462X1A Poisoning by other antidysrhythmic drugs, accidental (unintentional), initial encounter: Secondary | ICD-10-CM | POA: Diagnosis not present

## 2024-05-21 ENCOUNTER — Ambulatory Visit: Attending: Student | Admitting: Student

## 2024-05-21 ENCOUNTER — Encounter: Payer: Self-pay | Admitting: Student

## 2024-05-21 VITALS — BP 114/58 | HR 77 | Ht 68.0 in | Wt 179.2 lb

## 2024-05-21 DIAGNOSIS — Z95810 Presence of automatic (implantable) cardiac defibrillator: Secondary | ICD-10-CM | POA: Diagnosis not present

## 2024-05-21 DIAGNOSIS — Z79899 Other long term (current) drug therapy: Secondary | ICD-10-CM | POA: Diagnosis not present

## 2024-05-21 DIAGNOSIS — I472 Ventricular tachycardia, unspecified: Secondary | ICD-10-CM

## 2024-05-21 DIAGNOSIS — I495 Sick sinus syndrome: Secondary | ICD-10-CM | POA: Diagnosis not present

## 2024-05-21 MED ORDER — AMIODARONE HCL 200 MG PO TABS: 100.0000 mg | ORAL_TABLET | Freq: Every day | ORAL | 0 refills | Status: DC

## 2024-05-21 NOTE — Patient Instructions (Signed)
 Medication Instructions:  Change amiodarone  to 100 mg daily. This will be 1/2 of the 200 mg tablet daily. *If you need a refill on your cardiac medications before your next appointment, please call your pharmacy*  Lab Work: None ordered If you have labs (blood work) drawn today and your tests are completely normal, you will receive your results only by: MyChart Message (if you have MyChart) OR A paper copy in the mail If you have any lab test that is abnormal or we need to change your treatment, we will call you to review the results.  Follow-Up: At [facility], you and your health needs are our priority.  As part of our continuing mission to provide you with exceptional heart care, our providers are all part of one team.  This team includes your primary Cardiologist (physician) and Advanced Practice Providers or APPs (Physician Assistants and Nurse Practitioners) who all work together to provide you with the care you need, when you need it.  Your next appointment:   3 month(s)  Provider:   Ozell Jodie Passey, PA-C

## 2024-05-21 NOTE — Addendum Note (Signed)
 Addended by: TENNIE GRAEME CROME on: 05/21/2024 11:03 AM   Modules accepted: Orders

## 2024-05-21 NOTE — Progress Notes (Signed)
  Electrophysiology Office Note:   ID:  Dakota Gutierrez, DOB Sep 19, 1951, MRN [id number]  Primary Cardiologist: None Electrophysiologist: Elspeth Sage, MD      History of Present Illness:   Dakota Gutierrez is a 73 y.o. male with h/o VT, NSVT, s/p VT s/p MDT ICD, SND, and hypertransaminasemia  seen today for routine electrophysiology followup.   Seen by Dr. Sage 03/20/2024 and amiodarone  decrease to 100 mg a week without interval VT.  With DOE, PFTS and High Res CT ordered. PFTs normal, High res CT with possible ILD and referred to pulmonary.  Since last being seen in our clinic the patient reports doing well. Still feels like he has shallow breathing. Hasn't been heard back from pulmonary yet re: referral. Number given today. Otherwise,  he denies chest pain, palpitations, PND, orthopnea, nausea, vomiting, dizziness, syncope, edema, weight gain, or early satiety.   Review of systems complete and found to be negative unless listed in HPI.   EP Information / Studies Reviewed:    EKG is not ordered today. EKG from 03/20/2024 reviewed which showed AP-VS at 76 bpm        ICD Interrogation-  reviewed in detail today,  See PACEART report.  Arrhythmia/Device History Medtronic Dual Chamber ICD 03/2011 with gen change 2018.    Physical Exam:   VS:  BP (!) 114/58   Pulse 77   Ht 5' 8 (1.727 m)   Wt 179 lb 3.2 oz (81.3 kg)   SpO2 98%   BMI 27.25 kg/m    Wt Readings from Last 3 Encounters:  05/21/24 179 lb 3.2 oz (81.3 kg)  03/20/24 178 lb (80.7 kg)  11/06/23 176 lb 3.2 oz (79.9 kg)     GEN: No acute distress  NECK: No JVD; No carotid bruits CARDIAC: Regular rate and rhythm, no murmurs, rubs, gallops RESPIRATORY:  Clear to auscultation without rales, wheezing or rhonchi  ABDOMEN: Soft, non-tender, non-distended EXTREMITIES:  No edema; No deformity   ASSESSMENT AND PLAN:    Ventricular arrhythmia  s/p Medtronic dual chamber ICD  euvolemic today Stable on an appropriate  medical regimen Normal ICD function last check. Not performed today.  Decrease amiodarone  to 100 mg daily with plans to potentially stop in September (or sooner if seen by pulm and recommended)  Amiodarone  use Hypertransaminasemia  LFTs improved on 05/09/2024 LFTs Update surveillance labs at next visit.   DOE High Res CT 04/03/2024 with pulmonary parenchymal pattern of interstitial lung disease may be due to mild fibrotic nonspecific interstitial pneumonitis or usual interstitial pneumonitis - referred to pulm Normal PFTs 4/29/205  CAD No s/s of ischemia.      Disposition:   Follow up with EP APP in 3 months; Sooner with recurrent arrhythmia.    Signed, Ozell Prentice Passey, PA-C

## 2024-06-12 ENCOUNTER — Ambulatory Visit: Payer: Medicare PPO

## 2024-06-12 DIAGNOSIS — I428 Other cardiomyopathies: Secondary | ICD-10-CM

## 2024-06-13 LAB — CUP PACEART REMOTE DEVICE CHECK
Battery Remaining Longevity: 9 mo
Battery Voltage: 2.89 V
Brady Statistic AP VP Percent: 0.03 %
Brady Statistic AP VS Percent: 97.98 %
Brady Statistic AS VP Percent: 0 %
Brady Statistic AS VS Percent: 1.98 %
Brady Statistic RA Percent Paced: 97.16 %
Brady Statistic RV Percent Paced: 0.04 %
Date Time Interrogation Session: 20250716022825
HighPow Impedance: 39 Ohm
HighPow Impedance: 49 Ohm
Implantable Lead Connection Status: 753985
Implantable Lead Connection Status: 753985
Implantable Lead Implant Date: 20120531
Implantable Lead Implant Date: 20120531
Implantable Lead Location: 753859
Implantable Lead Location: 753860
Implantable Lead Model: 4076
Implantable Lead Model: 6947
Implantable Pulse Generator Implant Date: 20180530
Lead Channel Impedance Value: 361 Ohm
Lead Channel Impedance Value: 418 Ohm
Lead Channel Impedance Value: 418 Ohm
Lead Channel Pacing Threshold Amplitude: 0.75 V
Lead Channel Pacing Threshold Amplitude: 0.75 V
Lead Channel Pacing Threshold Pulse Width: 0.4 ms
Lead Channel Pacing Threshold Pulse Width: 0.4 ms
Lead Channel Sensing Intrinsic Amplitude: 1.25 mV
Lead Channel Sensing Intrinsic Amplitude: 1.25 mV
Lead Channel Sensing Intrinsic Amplitude: 9.375 mV
Lead Channel Sensing Intrinsic Amplitude: 9.375 mV
Lead Channel Setting Pacing Amplitude: 1.5 V
Lead Channel Setting Pacing Amplitude: 2 V
Lead Channel Setting Pacing Pulse Width: 0.4 ms
Lead Channel Setting Sensing Sensitivity: 0.3 mV
Zone Setting Status: 755011
Zone Setting Status: 755011

## 2024-07-18 ENCOUNTER — Other Ambulatory Visit (HOSPITAL_COMMUNITY): Payer: Self-pay

## 2024-07-19 ENCOUNTER — Other Ambulatory Visit: Payer: Self-pay | Admitting: Family Medicine

## 2024-07-19 DIAGNOSIS — E1165 Type 2 diabetes mellitus with hyperglycemia: Secondary | ICD-10-CM

## 2024-07-22 ENCOUNTER — Other Ambulatory Visit (HOSPITAL_COMMUNITY): Payer: Self-pay

## 2024-07-24 ENCOUNTER — Other Ambulatory Visit (HOSPITAL_COMMUNITY): Payer: Self-pay

## 2024-07-31 ENCOUNTER — Other Ambulatory Visit: Payer: Self-pay

## 2024-07-31 DIAGNOSIS — I472 Ventricular tachycardia, unspecified: Secondary | ICD-10-CM

## 2024-08-01 MED ORDER — AMIODARONE HCL 200 MG PO TABS: 100.0000 mg | ORAL_TABLET | Freq: Every day | ORAL | 2 refills | Status: DC

## 2024-08-21 ENCOUNTER — Ambulatory Visit: Payer: Self-pay | Admitting: Pulmonary Disease

## 2024-08-21 ENCOUNTER — Encounter: Payer: Self-pay | Admitting: Pulmonary Disease

## 2024-08-21 VITALS — BP 130/78 | HR 77 | Temp 98.5°F | Ht 65.0 in | Wt 178.0 lb

## 2024-08-21 DIAGNOSIS — R0689 Other abnormalities of breathing: Secondary | ICD-10-CM

## 2024-08-21 DIAGNOSIS — Z95810 Presence of automatic (implantable) cardiac defibrillator: Secondary | ICD-10-CM | POA: Diagnosis not present

## 2024-08-21 DIAGNOSIS — R0602 Shortness of breath: Secondary | ICD-10-CM

## 2024-08-21 DIAGNOSIS — I472 Ventricular tachycardia, unspecified: Secondary | ICD-10-CM | POA: Diagnosis not present

## 2024-08-21 DIAGNOSIS — J849 Interstitial pulmonary disease, unspecified: Secondary | ICD-10-CM

## 2024-08-21 DIAGNOSIS — G4733 Obstructive sleep apnea (adult) (pediatric): Secondary | ICD-10-CM

## 2024-08-21 DIAGNOSIS — R0600 Dyspnea, unspecified: Secondary | ICD-10-CM | POA: Diagnosis not present

## 2024-08-21 NOTE — Patient Instructions (Signed)
  VISIT SUMMARY: You visited us  today due to shortness of breath that has been ongoing for 18 months. We discussed your history of ventricular tachycardia, ICD placement, and obstructive sleep apnea. We have planned several tests and follow-ups to better understand and manage your symptoms.  YOUR PLAN: SHORTNESS OF BREATH: You have been experiencing shortness of breath for 1.5 years, mainly when you exert yourself. We are considering several potential causes, including pulmonary hypertension, deconditioning, and cardiac issues. -We will order blood tests to check for autoimmune diseases that might be causing lung scarring. -We will coordinate with cardiology to see if you need a repeat echocardiogram to check for pulmonary hypertension. -If the echocardiogram is normal, we may consider a cardiopulmonary exercise test to further investigate the cause of your shortness of breath.  VERY MILD PULMONARY FIBROSIS: A high-resolution CT scan showed very mild scarring in your lungs, but it is not significantly affecting your lung function. -We will order blood tests to check for autoimmune diseases that might be causing the lung scarring.  OBSTRUCTIVE SLEEP APNEA ON CPAP: Your obstructive sleep apnea is being managed with CPAP, and you are following up with Dr. True Mar in neurology. -Continue using your CPAP as directed and follow up with Dr. True Mar for management.  VENTRICULAR TACHYCARDIA WITH ICD AND PRIOR ABLATION: You have a history of ventricular tachycardia and have an ICD in place. You also had an ablation procedure in the past. -We will coordinate with cardiology to see if you need a repeat echocardiogram to check for pulmonary hypertension.                                Contains text generated by [employer].

## 2024-08-21 NOTE — Progress Notes (Signed)
 Dakota Gutierrez    [id number]    Apr 11, 1951  Primary Care Physician:Greene, Reyes SAUNDERS, MD  Referring Physician: Inocencio Soyla Lunger, MD [address] [place],  [place] [address]  Chief complaint: Consult for dyspnea, abnormal CT  HPI: 73 y.o. who  has a past medical history of Anxiety, Diabetes mellitus without complication (HCC), Dual implantable cardiac defibrillator in situ, Elevated transaminase level, GERD (gastroesophageal reflux disease), HTN (hypertension), Other primary cardiomyopathies, Syncope, Tubular adenoma of colon, Ventricular tachycardia (HCC), Vitreomacular adhesion of left eye (02/15/2021), and Vitreomacular adhesion of right eye (02/15/2021).  Discussed the use of AI scribe software for clinical note transcription with the patient, who gave verbal consent to proceed.  History of Present Illness Dakota Gutierrez is a 73 year old male with ventricular tachycardia and ICD placement who presents with shortness of breath. He was referred by Dr. Fernande for evaluation of shortness of breath.  Dyspnea - Shortness of breath present for 18 months - Occurs primarily with exertion, but also at rest - No cough, sputum production, or wheezing  Sleep-disordered breathing - Uses CPAP for obstructive sleep apnea  Cardiac arrhythmia and device therapy - History of ventricular tachycardia - Implantable cardioverter-defibrillator (ICD) placement - Underwent VT ablation and stent placement in September 2024 - Amiodarone  dosage reduced from two tablets to half a tablet daily since September 2024  Environmental and occupational exposures - Never smoked - No known exposure to occupational hazards - No exposure to mold, hot tubs, jacuzzis, or feather pillows - Has a cat at home     Relevant Pulmonary history: Pets: Occupation: Exposures: No h/o chemo/XRT/amiodarone /macrodantin/MTX  No exposure to asbestos, silica or other organic allergens  Smoking  history: Travel history: Family history:   Outpatient Encounter Medications as of 08/21/2024  Medication Sig   acetaminophen  (TYLENOL ) 500 MG tablet Take 1,000 mg by mouth daily as needed for moderate pain or headache.   amiodarone  (PACERONE ) 200 MG tablet Take 0.5 tablets (100 mg total) by mouth daily.   aspirin  EC 81 MG tablet Take 81 mg by mouth daily at 12 noon.   atorvastatin  (LIPITOR) 10 MG tablet Take 1 tablet (10 mg total) by mouth daily.   Cholecalciferol (VITAMIN D ) 2000 UNITS tablet Take 2,000 Units by mouth daily at 12 noon.   empagliflozin  (JARDIANCE ) 25 MG TABS tablet Take 1 tablet (25 mg total) by mouth daily before breakfast.   famotidine  (PEPCID ) 20 MG tablet Take 1 tablet (20 mg total) by mouth 2 (two) times daily as needed.   metFORMIN  (GLUCOPHAGE ) 500 MG tablet TAKE 1 TABLET (500 MG TOTAL) BY MOUTH DAILY WITH BREAKFAST.   metoprolol  succinate (TOPROL -XL) 100 MG 24 hr tablet Take 1 tablet (100 mg total) by mouth every morning.   olmesartan -hydrochlorothiazide (BENICAR  HCT) 20-12.5 MG tablet Take 1 tablet by mouth daily.   Omega-3 Fatty Acids (FISH OIL) 1000 MG CAPS Take 1,000 mg by mouth 2 (two) times daily.   tadalafil  (CIALIS ) 20 MG tablet Take 0.5-1 tablets (10-20 mg total) by mouth daily as needed for erectile dysfunction.   ticagrelor  (BRILINTA ) 90 MG TABS tablet Take 1 tablet (90 mg total) by mouth 2 (two) times daily.   ALPRAZolam  (XANAX ) 0.25 MG tablet Take 1 tablet (0.25 mg total) by mouth 2 (two) times daily as needed for anxiety. (Patient not taking: Reported on 08/21/2024)   Continuous Glucose Sensor (FREESTYLE LIBRE 3 SENSOR) MISC 1 Application by Does not apply route every 14 (  fourteen) days. Place 1 sensor on the skin every 14 days. Use to check glucose continuously (Patient not taking: Reported on 08/21/2024)   No facility-administered encounter medications on file as of 08/21/2024.    Physical Exam: Today's Vitals   08/21/24 1501  BP: 130/78  Pulse: 77   Temp: 98.5 F (36.9 C)  TempSrc: Oral  SpO2: 97%  Weight: 178 lb (80.7 kg)  Height: 5' 5 (1.651 m)   Body mass index is 29.62 kg/m.  Physical Exam GEN: No acute distress CV: Regular rate and rhythm no murmurs LUNGS: Clear to auscultation bilaterally normal respiratory effort SKIN JOINTS: Warm and dry no rash   Data Reviewed: Imaging: CT high-resolution 04/03/2024-mild/minimal basilar reticulation, bronchiectasis.  Probable UIP I have reviewed the images personally.  PFTs: 03/26/2024 FVC 3.35 [93%], FEV1 2.85 [110%], F/F85, TLC 5.58 [89%], DLCO 20.01 [90%] Normal test  Labs: Assessment & Plan Shortness of breath Chronic dyspnea for 1.5 years, primarily on exertion. Normal pulmonary function tests and CT scan with mild scarring not causing functional impairment.  Could be early ILD but CT findings are not very impressive.  CT appearance is not consistent with amiodarone  toxicity.  Differential for dyspnea includes pulmonary hypertension, deconditioning, and potential cardiac causes. No evidence of COPD or asthma.  - Order blood tests to evaluate for autoimmune diseases causing lung scarring - Coordinate with cardiology to assess need for repeat echocardiogram to evaluate for pulmonary hypertension - Consider cardiopulmonary exercise test if echocardiogram is normal to further evaluate cause of dyspnea  Very mild pulmonary fibrosis High-resolution CT scan shows very mild scarring in both lungs. No significant impairment in lung function. Potential causes include environmental exposures or autoimmune diseases, though no symptoms suggestive of autoimmune conditions are present.   - Order blood tests to evaluate for autoimmune diseases causing lung scarring  Obstructive sleep apnea on CPAP Obstructive sleep apnea managed with CPAP. Follow-up and management by neurology, specifically Dr. True Mar. No issues reported with current CPAP use.  Ventricular tachycardia with ICD and  prior ablation Ventricular tachycardia with ICD placement and prior ablation. Last echocardiogram over a year ago. No recent cardiac events reported. Coordination with cardiology for further cardiac evaluation is planned. - Coordinate with cardiology to assess need for repeat echocardiogram to evaluate for pulmonary hypertension  Recommendations: CTD serologies, BNP Echocardiogram  Lonna Coder MD [facility] 08/21/2024, 3:11 PM  CC: Inocencio Soyla Lunger, MD

## 2024-08-22 LAB — BRAIN NATRIURETIC PEPTIDE: Pro B Natriuretic peptide (BNP): 98 pg/mL (ref 0.0–100.0)

## 2024-08-23 LAB — ANTI-NUCLEAR AB-TITER (ANA TITER): ANA Titer 1: 1:40 {titer} — ABNORMAL HIGH

## 2024-08-23 LAB — ANA,IFA RA DIAG PNL W/RFLX TIT/PATN
Cyclic Citrullin Peptide Ab: <16 U
Rheumatoid fact SerPl-aCnc: <10 [IU]/mL — AB (ref <14)
Rheumatoid fact SerPl-aCnc: <16 U (ref <14)

## 2024-08-26 ENCOUNTER — Encounter: Payer: Self-pay | Admitting: Student

## 2024-08-26 ENCOUNTER — Ambulatory Visit: Payer: Self-pay | Attending: Student | Admitting: Student

## 2024-08-26 VITALS — BP 124/60 | HR 78 | Ht 65.0 in | Wt 178.4 lb

## 2024-08-26 DIAGNOSIS — Z79899 Other long term (current) drug therapy: Secondary | ICD-10-CM

## 2024-08-26 DIAGNOSIS — R0600 Dyspnea, unspecified: Secondary | ICD-10-CM | POA: Diagnosis not present

## 2024-08-26 DIAGNOSIS — I472 Ventricular tachycardia, unspecified: Secondary | ICD-10-CM

## 2024-08-26 NOTE — Progress Notes (Signed)
  Electrophysiology Office Note:   ID:  Dakota Gutierrez, DOB October 19, 1951, MRN [id number]  Primary Cardiologist: None Electrophysiologist: Donnice DELENA Primus, MD      History of Present Illness:   Dakota Gutierrez is a 73 y.o. male with h/o  VT, NSVT, s/p VT s/p MDT ICD, SND, and hypertransaminasemia seen today for routine electrophysiology followup.   Seen 04/2024 for follow up from Dr. Fernande. With on-going DOE and High Res CT with possible ILD, was referred to pulmonary.   Since last being seen in our clinic the patient reports doing OK. Still with some mild DOE, but feels like it improves with cooler weather. No exertional chest pain, edema, or syncope.   Review of systems complete and found to be negative unless listed in HPI.   EP Information / Studies Reviewed:    EKG is not ordered today. EKG from 03/20/2024 reviewed which showed AP-VS at 76 bpm       ICD Interrogation-  Not checked today.   Arrhythmia/Device History Medtronic Dual Chamber ICD 03/2011 with gen change 2018.    Physical Exam:   VS:  BP 124/60   Pulse 78   Ht 5' 5 (1.651 m)   Wt 178 lb 6.4 oz (80.9 kg)   SpO2 98%   BMI 29.69 kg/m    Wt Readings from Last 3 Encounters:  08/26/24 178 lb 6.4 oz (80.9 kg)  08/21/24 178 lb (80.7 kg)  05/21/24 179 lb 3.2 oz (81.3 kg)     GEN: No acute distress  NECK: No JVD; No carotid bruits CARDIAC: Regular rate and rhythm, no murmurs, rubs, gallops RESPIRATORY:  Clear to auscultation without rales, wheezing or rhonchi  ABDOMEN: Soft, non-tender, non-distended EXTREMITIES:  No edema; No deformity   ASSESSMENT AND PLAN:    Ventricular arrhythmia  s/p Medtronic dual chamber ICD  euvolemic today Stable on an appropriate medical regimen Normal ICD function See Pace Art report No changes today  Amiodarone  use Hypertransaminasemia Continue amiodarone  100 mg daily LFTs improved on 05/09/2024 LFTs Follow up again today.   DOE High Res CT 04/03/2024 with pulmonary  parenchymal pattern of interstitial lung disease may be due to mild fibrotic nonspecific interstitial pneumonitis or usual interstitial pneumonitis - referred to pulm Normal PFTs 4/29/205 Will update Echo; Echo 01/2023 with normal EF and Grade 1 DD. No significant valvular disease.  CAD No s/s of ischemia.      Disposition:   Follow up with Dr. Primus in 6 months   Signed, Ozell Prentice Passey, PA-C

## 2024-08-26 NOTE — Patient Instructions (Signed)
 Medication Instructions:  Your physician recommends that you continue on your current medications as directed. Please refer to the Current Medication list given to you today.  *If you need a refill on your cardiac medications before your next appointment, please call your pharmacy*  Lab Work: TSH, FreeT4, CMET-TODAY If you have labs (blood work) drawn today and your tests are completely normal, you will receive your results only by: MyChart Message (if you have MyChart) OR A paper copy in the mail If you have any lab test that is abnormal or we need to change your treatment, we will call you to review the results.  Testing/Procedures: Your physician has requested that you have an echocardiogram. Echocardiography is a painless test that uses sound waves to create images of your heart. It provides your doctor with information about the size and shape of your heart and how well your heart's chambers and valves are working. This procedure takes approximately one hour. There are no restrictions for this procedure. Please do NOT wear cologne, perfume, aftershave, or lotions (deodorant is allowed). Please arrive 15 minutes prior to your appointment time.  Please note: We ask at that you not bring children with you during ultrasound (echo/ vascular) testing. Due to room size and safety concerns, children are not allowed in the ultrasound rooms during exams. Our front office staff cannot provide observation of children in our lobby area while testing is being conducted. An adult accompanying a patient to their appointment will only be allowed in the ultrasound room at the discretion of the ultrasound technician under special circumstances. We apologize for any inconvenience.   Follow-Up: At [facility], you and your health needs are our priority.  As part of our continuing mission to provide you with exceptional heart care, our providers are all part of one team.  This team includes your primary  Cardiologist (physician) and Advanced Practice Providers or APPs (Physician Assistants and Nurse Practitioners) who all work together to provide you with the care you need, when you need it.  Your next appointment:   6 month(s)  Provider:   Donnice Primus, MD

## 2024-08-27 ENCOUNTER — Ambulatory Visit: Payer: Self-pay | Admitting: Pulmonary Disease

## 2024-08-27 ENCOUNTER — Ambulatory Visit: Payer: Self-pay | Admitting: Student

## 2024-08-27 LAB — COMPREHENSIVE METABOLIC PANEL WITH GFR
ALT: 27 IU/L (ref 0–44)
AST: 38 IU/L (ref 0–40)
Albumin: 4.7 g/dL (ref 3.8–4.8)
Alkaline Phosphatase: 57 IU/L (ref 47–123)
BUN/Creatinine Ratio: 18 (ref 10–24)
BUN: 19 mg/dL (ref 8–27)
Bilirubin Total: 0.7 mg/dL (ref 0.0–1.2)
CO2: 21 mmol/L (ref 20–29)
Calcium: 9.4 mg/dL (ref 8.6–10.2)
Chloride: 101 mmol/L (ref 96–106)
Creatinine, Ser: 1.06 mg/dL (ref 0.76–1.27)
Globulin, Total: 2.3 g/dL (ref 1.5–4.5)
Glucose: 159 mg/dL — ABNORMAL HIGH (ref 70–99)
Potassium: 3.9 mmol/L (ref 3.5–5.2)
Sodium: 139 mmol/L (ref 134–144)
Total Protein: 7 g/dL (ref 6.0–8.5)
eGFR: 73 mL/min/1.73 (ref >59)

## 2024-08-27 LAB — T4, FREE: Free T4: 1.4 ng/dL (ref 0.82–1.77)

## 2024-08-27 LAB — TSH: TSH: 4.49 u[IU]/mL (ref 0.450–4.500)

## 2024-08-30 ENCOUNTER — Other Ambulatory Visit: Payer: Self-pay | Admitting: Family Medicine

## 2024-08-30 DIAGNOSIS — E1165 Type 2 diabetes mellitus with hyperglycemia: Secondary | ICD-10-CM

## 2024-08-30 NOTE — Telephone Encounter (Signed)
 Patient was supposed to have appt in June and did not make it in, called to make an appointment so we can continue medications, will send in refill once appt has been made.

## 2024-09-03 ENCOUNTER — Telehealth: Payer: Self-pay

## 2024-09-03 DIAGNOSIS — I10 Essential (primary) hypertension: Secondary | ICD-10-CM | POA: Diagnosis not present

## 2024-09-03 DIAGNOSIS — T82198A Other mechanical complication of other cardiac electronic device, initial encounter: Secondary | ICD-10-CM | POA: Diagnosis not present

## 2024-09-03 DIAGNOSIS — I200 Unstable angina: Secondary | ICD-10-CM | POA: Diagnosis not present

## 2024-09-03 DIAGNOSIS — I499 Cardiac arrhythmia, unspecified: Secondary | ICD-10-CM | POA: Diagnosis not present

## 2024-09-03 DIAGNOSIS — I472 Ventricular tachycardia, unspecified: Secondary | ICD-10-CM | POA: Diagnosis not present

## 2024-09-03 DIAGNOSIS — R778 Other specified abnormalities of plasma proteins: Secondary | ICD-10-CM | POA: Diagnosis not present

## 2024-09-03 DIAGNOSIS — I2510 Atherosclerotic heart disease of native coronary artery without angina pectoris: Secondary | ICD-10-CM | POA: Diagnosis not present

## 2024-09-03 DIAGNOSIS — R7989 Other specified abnormal findings of blood chemistry: Secondary | ICD-10-CM | POA: Diagnosis not present

## 2024-09-03 DIAGNOSIS — E119 Type 2 diabetes mellitus without complications: Secondary | ICD-10-CM | POA: Diagnosis not present

## 2024-09-03 DIAGNOSIS — Z7984 Long term (current) use of oral hypoglycemic drugs: Secondary | ICD-10-CM | POA: Diagnosis not present

## 2024-09-03 DIAGNOSIS — Z7901 Long term (current) use of anticoagulants: Secondary | ICD-10-CM | POA: Diagnosis not present

## 2024-09-03 DIAGNOSIS — I452 Bifascicular block: Secondary | ICD-10-CM | POA: Diagnosis not present

## 2024-09-03 DIAGNOSIS — Z955 Presence of coronary angioplasty implant and graft: Secondary | ICD-10-CM | POA: Diagnosis not present

## 2024-09-03 DIAGNOSIS — Z4502 Encounter for adjustment and management of automatic implantable cardiac defibrillator: Secondary | ICD-10-CM | POA: Diagnosis not present

## 2024-09-03 DIAGNOSIS — Z79899 Other long term (current) drug therapy: Secondary | ICD-10-CM | POA: Diagnosis not present

## 2024-09-03 DIAGNOSIS — I482 Chronic atrial fibrillation, unspecified: Secondary | ICD-10-CM | POA: Diagnosis not present

## 2024-09-03 DIAGNOSIS — Z950 Presence of cardiac pacemaker: Secondary | ICD-10-CM | POA: Diagnosis not present

## 2024-09-03 NOTE — Progress Notes (Signed)
 Remote ICD Transmission

## 2024-09-03 NOTE — Telephone Encounter (Signed)
 Spoke to pts wife Willy who advised patient is in an ER room receiving treatment. Was appreciative of call. Requested Patti call back to keep us  posted once they know more.

## 2024-09-03 NOTE — Telephone Encounter (Signed)
 Pt wife called in stating that pt has got shocked 3 times. Pt was told to go to ED pt wife would like a call back

## 2024-09-03 NOTE — Telephone Encounter (Signed)
 Late Entry: 09/03/24 @ 14:05.  Spoke to patients wife who states patient was on a walk, call her to inform he had been shocked 3 times. Wife reports patient was in a Taxi on the way back to hotel.   Advised wife to call 911 and patient NEEDS to go to ER for evaluation. Wife reported to Clinical research associate she will call 911 when pt returns to hotel.

## 2024-09-03 NOTE — Telephone Encounter (Signed)
 Attempted to contact patients wife to follow up. No answer, LMTCB.

## 2024-09-04 ENCOUNTER — Telehealth: Payer: Self-pay

## 2024-09-04 NOTE — Telephone Encounter (Signed)
 Reviewed with Dr. Waddell.  Pt received inappropriate therapy.  First VT zone starts at 107 BPM.  Also appears to be some atrial undersensing.  Per Dr. Taylor-Pt will need VT zone reprogrammed.  Outreach made to Pt/wife.  Pt is in [place] .  Requested they have physician contact office to advise of inappropriate therapy.  Call back received from Dr. Cleotilde at [facility].  He will increase VT zone to start at 120 bpm.  Pt to return to [place] this weekend and will follow up at [facility] for ischemic work up.  Will schedule Pt with cardiology upon return.  Scheduled to see Dr. Kriste 09/10/2024 at 2:00 pm.  Pt aware.

## 2024-09-04 NOTE — Telephone Encounter (Signed)
 Alert received:  Alert remote transmission:  5 shocks delivered for episode #2905  Event occurred 10/7 @ 12:44, duration 58sec, event triggered by PVC, ATP delivered x4 followed by HV therapy 35J x5.

## 2024-09-04 NOTE — Progress Notes (Signed)
 CHAIS FEHRINGER                                          MRN: [id number]   09/04/2024   The VBCI Quality Team Specialist reviewed this patient medical record for the purposes of chart review for care gap closure. The following were reviewed: chart review for care gap closure-kidney health evaluation for diabetes:eGFR  and uACR.    VBCI Quality Team

## 2024-09-05 NOTE — Progress Notes (Signed)
 Dakota Gutierrez                                          MRN: [id number]   09/05/2024   The VBCI Quality Team Specialist reviewed this patient medical record for the purposes of chart review for care gap closure. The following were reviewed: chart review for care gap closure-glycemic status assessment.    VBCI Quality Team

## 2024-09-07 ENCOUNTER — Telehealth: Payer: Self-pay | Admitting: Cardiology

## 2024-09-07 NOTE — Telephone Encounter (Signed)
 Patient's wife Willy called answering service this afternoon because patient received a shock from his ICD.  Patient was recently on a trip to [place] .  On 10/7 he was in his usual state of health but he received 5 shocks from his ICD.  He went to the ED for evaluation.  While there, a nurse from our office contacted them with remote transition.  Transmission was reviewed with Dr. Waddell.  Patient had received inappropriate therapy and patient needed VT zone reprogrammed.  The hospital in [place]  reprogram the device per Dr. Adrian recommendations.  Since then, patient has overall been feeling well.  He was in his usual state of health this morning walking out of his hotel when he was shocked 1 time by his device.  He denied any chest pain, shortness of breath.  He was able to drive home from [place]  and has continued to feel well.  Patient tells me that he has been feeling well all day.  He has not had a second shock since this morning.  Patient is currently scheduled to see Dr. Kriste on 10/14.  Appears he also has an appointment on 10/14 with the device nurse to reevaluate VT zones. He was previously followed by Dr. Fernande with plans to follow with Dr. Almetta moving forward from an EP perspective.  As patient has only received 1 shock and is overall feeling well, patient does not need to be seen in the ED.  I will forward this message to Dr. Almetta and Dr. Waddell as an RICK.  I instructed patient to not exert himself until he is seen in the office.  Advised patient that if he is shocked by his device between now and his appointment on 10/14, he should come to the ED for evaluation.  Patient and his wife voiced understanding and agreement.  I briefly discussed case with Dr. Inocencio who agreed with plan   Rollo FABIENE Louder, PA-C 09/07/2024 2:06 PM

## 2024-09-09 ENCOUNTER — Ambulatory Visit: Attending: Cardiology

## 2024-09-09 DIAGNOSIS — I472 Ventricular tachycardia, unspecified: Secondary | ICD-10-CM

## 2024-09-09 LAB — CUP PACEART INCLINIC DEVICE CHECK
Date Time Interrogation Session: 20251013152651
Implantable Lead Connection Status: 753985
Implantable Lead Connection Status: 753985
Implantable Lead Implant Date: 20120531
Implantable Lead Implant Date: 20120531
Implantable Lead Location: 753859
Implantable Lead Location: 753860
Implantable Lead Model: 4076
Implantable Lead Model: 6947
Implantable Pulse Generator Implant Date: 20180530

## 2024-09-09 NOTE — Patient Instructions (Signed)
 Will determine follow up with Dr. Almetta.

## 2024-09-09 NOTE — Telephone Encounter (Signed)
 Transmission received.  Pt received an additional shock while walking with his wife on his way home from [place] .  Wife calling office to follow up on transmission.  Offered to see Pt today if he would like.  Otherwise plan to see Pt tomorrow as scheduled.  Wife will call back if Pt wants to be seen today.

## 2024-09-09 NOTE — Telephone Encounter (Signed)
 Discussed with Jodie.  Will evaluate atrial sensitivity and reprogram.  Will also obtain new wavelet.  Pt will follow up with gen cards for ischemic eval.  Will schedule follow up with Dr. Almetta to establish.

## 2024-09-09 NOTE — Progress Notes (Signed)
 Pt seen in device clinic d/t recent HV therapies.  Some atrial undersensing noted.  Atrial sensitivity reprogrammed to 0.66mV.  New wavelet obtained. Will discuss further changes with Dr. Ralph.

## 2024-09-10 ENCOUNTER — Ambulatory Visit

## 2024-09-10 ENCOUNTER — Encounter: Payer: Self-pay | Admitting: Internal Medicine

## 2024-09-10 ENCOUNTER — Ambulatory Visit: Attending: Internal Medicine | Admitting: Internal Medicine

## 2024-09-10 VITALS — BP 92/58 | HR 77 | Ht 65.0 in | Wt 175.2 lb

## 2024-09-10 DIAGNOSIS — E1169 Type 2 diabetes mellitus with other specified complication: Secondary | ICD-10-CM | POA: Diagnosis not present

## 2024-09-10 DIAGNOSIS — Z79899 Other long term (current) drug therapy: Secondary | ICD-10-CM | POA: Diagnosis not present

## 2024-09-10 DIAGNOSIS — I2510 Atherosclerotic heart disease of native coronary artery without angina pectoris: Secondary | ICD-10-CM

## 2024-09-10 DIAGNOSIS — I25119 Atherosclerotic heart disease of native coronary artery with unspecified angina pectoris: Secondary | ICD-10-CM

## 2024-09-10 DIAGNOSIS — I472 Ventricular tachycardia, unspecified: Secondary | ICD-10-CM

## 2024-09-10 DIAGNOSIS — E785 Hyperlipidemia, unspecified: Secondary | ICD-10-CM

## 2024-09-10 NOTE — Patient Instructions (Addendum)
 Medication Instructions:  No medication changes today! *If you need a refill on your cardiac medications before your next appointment, please call your pharmacy*  Lab Work: Labs done today, your results will be available in MyChart, we will contact you for abnormal readings.  If you have labs (blood work) drawn today and your tests are completely normal, you will receive your results only by: MyChart Message (if you have MyChart) OR A paper copy in the mail If you have any lab test that is abnormal or we need to change your treatment, we will call you to review the results.  Testing/Procedures: How to Prepare for Your Myoview Test (stress test):  Please do not take these medications before your test: Jardiance , Benicar  Your remaining medications may be taken with water. Nothing to eat or drink, except water, 4 hours prior to arrival time.  NO caffeine/decaffeinated products, or chocolate 12 hours prior to arrival. [place], please do not wear dresses.  Skirts or pants are approprate, please wear a short sleeve shirt. NO perfume, cologne or lotion Wear comfortable walking shoes.  NO HEELS! Total time is 3 to 4 hours; you may want to bring reading material for the waiting time. Please report to [facility] for your test  What to expect after you arrive:  Once you arrive and check in for your appointment an IV will be started in your arm.  Then the Technoligist will inject a small amount of radioactive tracer.  There will be a 1 hour waiting period after this injection.  A series of pictures will be taken of your heart following this waiting period.  You will be prepped for the stress portion of the test.  During the stress portion of your test you will either walk on a treadmill or receive a small, safe amount of radioactive tracer injected in your IV.  After the stress portion, there is a short rest period during which time your heart and blood pressure will be monitored.  After the short  rest period the Technologist will begin your second set of pictures.  Your doctor will inform you of your test results within 7-10 business days.  In preparation for your appointment, medication and supplies will be purchased.  Appointment availability is limited, so if you need to cancel or reschedule please call the office at [phone] 24 hours in advance to avoid a cancellation fee of $100.00  IF YOU THINK YOU MAY BE PREGNANT, PLEASE INFORM THE TECHNOLOGIST.   Follow-Up: At [facility], you and your health needs are our priority.  As part of our continuing mission to provide you with exceptional heart care, our providers are all part of one team.  This team includes your primary Cardiologist (physician) and Advanced Practice Providers or APPs (Physician Assistants and Nurse Practitioners) who all work together to provide you with the care you need, when you need it.  Your next appointment:   6 month(s)  Provider:   Dr. Emeline Calender   We recommend signing up for the patient portal called MyChart.  Sign up information is provided on this After Visit Summary.  MyChart is used to connect with patients for Virtual Visits (Telemedicine).  Patients are able to view lab/test results, encounter notes, upcoming appointments, etc.  Non-urgent messages can be sent to your provider as well.   To learn more about what you can do with MyChart, go to [email or web address].

## 2024-09-10 NOTE — Progress Notes (Addendum)
 Cardiology Office Note   Date:  09/10/2024  ID:  Dakota Gutierrez, DOB 08-26-1951, MRN [id number] PCP: Levora Reyes SAUNDERS, MD  [name] Cardiologist:  Emeline FORBES Calender, MD Electrophysiologist:  Donnice DELENA Primus, MD     History of Present Illness Dakota Gutierrez is a 73 y.o. 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  He presents today for ischemic evaluation given his history of ICD shocks.  Patient was recently in [place]  last week and while he was there he had up to 10 ICD shocks.  After discussion with the pacer nurse, it appears that he was inappropriately shocked.  According to her, the patient had a history of slow VT and had his zones reduced to an HR of 107.  Since that time he had a VT ablation but did not have his zones adjusted.  While in [place] , he was simply walking and had an elevated heart rate from this and was shocked inappropriately up to 10 times.  Aside from this, he does have occasional chest soreness which occurs both at rest and with exertion.  This has been going on for several months and has been stable over that period of time without any significant change.  He does not get palpitations or diaphoresis or radiation of symptoms.  He has been compliant with his medications.  Denies any tobacco use and is able to otherwise exercise without symptoms.   ROS:  Review of Systems  All  other systems reviewed and are negative.   Physical Exam  Physical Exam Vitals and nursing note reviewed.  Constitutional:      Appearance: Normal appearance.  HENT:     Head: Normocephalic and atraumatic.  Eyes:     Conjunctiva/sclera: Conjunctivae normal.  Neck:     Vascular: No carotid bruit.  Cardiovascular:     Rate and Rhythm: Normal rate and regular rhythm.  Pulmonary:     Effort: Pulmonary effort is normal.     Breath sounds: Normal breath sounds.  Musculoskeletal:        General: No swelling or tenderness.  Skin:    Coloration: Skin is not jaundiced or pale.  Neurological:     Mental Status: He is alert.     VS:  BP (!) 92/58   Pulse 77   Ht 5' 5 (1.651 m)   Wt 175 lb 3.2 oz (79.5 kg)   SpO2 96%   BMI 29.15 kg/m         Wt Readings from Last 3 Encounters:  09/10/24 175 lb 3.2 oz (79.5 kg)  08/26/24 178 lb 6.4 oz (80.9 kg)  08/21/24 178 lb (80.7 kg)     EKG Interpretation Date/Time:    Ventricular Rate:    PR Interval:    QRS Duration:    QT Interval:    QTC Calculation:   R Axis:      Text Interpretation:      Studies Reviewed   Left heart catheterization 06/09/2023:  RPAV lesion is 50% stenosed.   Acute Mrg lesion is 100% stenosed.   Non-stenotic Prox LAD to Mid LAD lesion was previously treated.   Non-stenotic Mid RCA lesion was previously treated.   The left ventricular systolic function is normal.   LV end diastolic pressure is normal.   The left ventricular ejection fraction is 55-65% by visual estimate.   Nonobstructive CAD. Widely patent LAD and RCA stents Normal LV function Normal LVEDP     Risk Assessment/Calculations             ASCVD risk score: The ASCVD Risk score (Arnett DK, et al., 2019) failed to calculate for the following reasons:   Risk score cannot be calculated because patient has a medical history suggesting prior/existing ASCVD   ASSESSMENT  Chest discomfort, possibly cardiac, with history of CAD  s/p PCI to LAD and RCA and multiple ICD firings VT s/p Medtronic dual-chamber ICD placed in 2012 with generator change in 2018 with recent inappropriate firing history of VT storm and incessant VT treated with amiodarone  and lidocaine  successfully in the past.  On amiodarone  100 mg and metoprolol  succinate 100 mg daily.  Device adjusted yesterday and today by pacer nurse.  Question if he would do well with addition of mexiletine?  Will defer to EP Nonobstructive CAD with widely patent LAD and RCA stents in 2024 on aspirin , Brilinta , atorvastatin  10 mg, metoprolol  succinate   Plan  Patient already has an echocardiogram scheduled for November.  Given the change as above, we will try and schedule his echocardiogram within the next week Will pursue an ischemic workup with a pharmacologic SPECT.  No need for invasive angiography at this time as he is having stable chest discomfort without any ACS or progressive angina Lipid panel with goal LDL less than 55 Continue medications Depending on the workup, he can likely continue on antiplatelet monotherapy since he is greater than 1 year from his PCI Will try to have the patient follow-up with Dr. Almetta, EP. Cardiac risk counseling and prevention recommendations: Heart healthy/Mediterranean diet with whole grains, fruits, vegetable, fish, lean meats, nuts, and olive oil. Limit salt. Moderate walking, 3-5 times/week for 30-50 minutes each session. Aim for at least 150 minutes.week. Goal should be pace of 3 miles/hour, or walking 1.5 miles in 30 minutes Avoidance of tobacco products. Avoid excess alcohol.  Follow up: 6 months or sooner pending above workup     Informed Consent   Shared Decision Making/Informed Consent The risks [chest pain, shortness of breath, cardiac arrhythmias, dizziness, blood pressure fluctuations, myocardial infarction, stroke/transient ischemic attack, nausea, vomiting, allergic reaction, radiation exposure, metallic taste  sensation and life-threatening complications (estimated to be 1 in 10,000)], benefits (risk stratification, diagnosing coronary artery disease, treatment guidance) and alternatives of a nuclear stress test were discussed in detail with Mr. Alvidrez and he agrees to proceed.       Signed, Emeline FORBES Calender, MD

## 2024-09-11 ENCOUNTER — Telehealth: Payer: Self-pay

## 2024-09-11 ENCOUNTER — Ambulatory Visit: Payer: Medicare PPO

## 2024-09-11 DIAGNOSIS — I428 Other cardiomyopathies: Secondary | ICD-10-CM

## 2024-09-11 LAB — LIPID PANEL
Chol/HDL Ratio: 3.5 ratio (ref 0.0–5.0)
Cholesterol, Total: 119 mg/dL (ref 100–199)
HDL: 34 mg/dL — ABNORMAL LOW (ref >39)
LDL Chol Calc (NIH): 46 mg/dL (ref 0–99)
Triglycerides: 248 mg/dL — ABNORMAL HIGH (ref 0–149)
VLDL Cholesterol Cal: 39 mg/dL (ref 5–40)

## 2024-09-11 NOTE — Telephone Encounter (Signed)
 Pt wife called in wanting someone to go over the 09/11/2024 transmission and see if anything was seen since changes were made 09/10/2024.

## 2024-09-11 NOTE — Telephone Encounter (Signed)
 Returned call to patient wife as requested. Informed patient wife there is no changes or episodes noted since reprogramming changes were made yesterday, 09/10/2024. Wife states patient has not felt good since changes were made w/ complaint of fatigue and feeling hot & cold. Patient wife states medication compliance. Educated patient on changes that were made and informed these changes should not be the reason for patient current symptoms. Advised to keep a check on patient temperature and attempt to rest and stay hydrated.   Informed patient if symptoms worsen to call device clinic. ED precautions given if symptoms become severe, such as severe SOB/chest pain. Patient verbalized understanding.   Patient and patient wife aware of upcoming Echocardiogram appt on 09/16/2024 & upcoming appt w/ Dr. Almetta on 09/23/2024.   Will continue to monitor and update accordingly. Appreciative for call.

## 2024-09-12 ENCOUNTER — Ambulatory Visit: Payer: Self-pay | Admitting: Internal Medicine

## 2024-09-12 ENCOUNTER — Telehealth: Payer: Self-pay

## 2024-09-12 ENCOUNTER — Other Ambulatory Visit: Payer: Self-pay | Admitting: Internal Medicine

## 2024-09-12 DIAGNOSIS — I2510 Atherosclerotic heart disease of native coronary artery without angina pectoris: Secondary | ICD-10-CM

## 2024-09-12 LAB — CUP PACEART REMOTE DEVICE CHECK
Battery Remaining Longevity: 3 mo
Battery Voltage: 2.79 V
Brady Statistic AP VP Percent: 0.02 %
Brady Statistic AP VS Percent: 82.09 %
Brady Statistic AS VP Percent: 0.03 %
Brady Statistic AS VS Percent: 17.85 %
Brady Statistic RA Percent Paced: 81.25 %
Brady Statistic RV Percent Paced: 0.05 %
Date Time Interrogation Session: 20251015033543
HighPow Impedance: 41 Ohm
HighPow Impedance: 48 Ohm
Implantable Lead Connection Status: 753985
Implantable Lead Connection Status: 753985
Implantable Lead Implant Date: 20120531
Implantable Lead Implant Date: 20120531
Implantable Lead Location: 753859
Implantable Lead Location: 753860
Implantable Lead Model: 4076
Implantable Lead Model: 6947
Implantable Pulse Generator Implant Date: 20180530
Lead Channel Impedance Value: 342 Ohm
Lead Channel Impedance Value: 399 Ohm
Lead Channel Impedance Value: 456 Ohm
Lead Channel Pacing Threshold Amplitude: 0.75 V
Lead Channel Pacing Threshold Amplitude: 0.75 V
Lead Channel Pacing Threshold Pulse Width: 0.4 ms
Lead Channel Pacing Threshold Pulse Width: 0.4 ms
Lead Channel Sensing Intrinsic Amplitude: 1.375 mV
Lead Channel Sensing Intrinsic Amplitude: 1.375 mV
Lead Channel Sensing Intrinsic Amplitude: 12 mV
Lead Channel Sensing Intrinsic Amplitude: 12 mV
Lead Channel Setting Pacing Amplitude: 1.5 V
Lead Channel Setting Pacing Amplitude: 2 V
Lead Channel Setting Pacing Pulse Width: 0.4 ms
Lead Channel Setting Sensing Sensitivity: 0.3 mV
Zone Setting Status: 755011
Zone Setting Status: 755011

## 2024-09-12 NOTE — Telephone Encounter (Signed)
 Copied from CRM [id number]. Topic: Appointments - Appointment Scheduling >> Sep 12, 2024 10:33 AM Alfonso ORN wrote: Pt called and appt was scheduled with pcp for followup, please continue with refill as pt was off the phone prior to me confirming the preferred pharmacy.   empagliflozin  (JARDIANCE ) 25 MG TABS tablet  as directed by Jacquline

## 2024-09-13 ENCOUNTER — Telehealth (HOSPITAL_COMMUNITY): Payer: Self-pay | Admitting: *Deleted

## 2024-09-13 NOTE — Telephone Encounter (Signed)
 Patient given detailed instructions per Myocardial Perfusion Study Information Sheet for the test on 09/16/2024 at 12:45 Dakota Gutierrez . Patient notified to arrive 15 minutes early and that it is imperative to arrive on time for appointment to keep from having the test rescheduled.  If you need to cancel or reschedule your appointment, please call the office within 24 hours of your appointment. . Patient verbalized understanding.Dakota Gutierrez

## 2024-09-16 ENCOUNTER — Ambulatory Visit (HOSPITAL_COMMUNITY)
Admission: RE | Admit: 2024-09-16 | Discharge: 2024-09-16 | Disposition: A | Discharge status: Home / Self Care | Source: Ambulatory Visit | Attending: Cardiology | Admitting: Cardiology

## 2024-09-16 ENCOUNTER — Ambulatory Visit (HOSPITAL_BASED_OUTPATIENT_CLINIC_OR_DEPARTMENT_OTHER)
Admission: RE | Admit: 2024-09-16 | Discharge: 2024-09-16 | Disposition: A | Discharge status: Home / Self Care | Source: Ambulatory Visit | Attending: Cardiology | Admitting: Cardiology

## 2024-09-16 DIAGNOSIS — I2510 Atherosclerotic heart disease of native coronary artery without angina pectoris: Secondary | ICD-10-CM | POA: Insufficient documentation

## 2024-09-16 DIAGNOSIS — R0600 Dyspnea, unspecified: Secondary | ICD-10-CM | POA: Diagnosis not present

## 2024-09-16 DIAGNOSIS — I700 Atherosclerosis of aorta: Secondary | ICD-10-CM | POA: Diagnosis not present

## 2024-09-16 LAB — MYOCARDIAL PERFUSION IMAGING
LV dias vol: 92 mL (ref 62–150)
LV sys vol: 56 mL (ref 4.2–5.8)
Nuc Stress EF: 39 %
Peak HR: 86 {beats}/min
Rest HR: 76 {beats}/min
Rest Nuclear Isotope Dose: 10.7 mCi
SDS: 3
SRS: 7
SSS: 7
ST Depression (mm): 0 mm
Stress Nuclear Isotope Dose: 32.4 mCi
TID: 1.06

## 2024-09-16 LAB — ECHOCARDIOGRAM COMPLETE
Area-P 1/2: 3.95 cm2
S' Lateral: 3.5 cm

## 2024-09-16 MED ORDER — TECHNETIUM TC 99M TETROFOSMIN IV KIT
10.7000 | PACK | Freq: Once | INTRAVENOUS | Status: AC | PRN
Start: 2024-09-16 — End: 2024-09-16
  Administered 2024-09-16: 10.7 via INTRAVENOUS

## 2024-09-16 MED ORDER — REGADENOSON 0.4 MG/5ML IV SOLN
INTRAVENOUS | Status: AC
Start: 2024-09-16 — End: 2024-09-16
  Filled 2024-09-16: qty 5

## 2024-09-16 MED ORDER — TECHNETIUM TC 99M TETROFOSMIN IV KIT
32.4000 | PACK | Freq: Once | INTRAVENOUS | Status: AC | PRN
  Administered 2024-09-16: 32.4 via INTRAVENOUS

## 2024-09-16 MED ORDER — REGADENOSON 0.4 MG/5ML IV SOLN
0.4000 mg | Freq: Once | INTRAVENOUS | Status: AC
  Administered 2024-09-16: 0.4 mg via INTRAVENOUS

## 2024-09-17 MED ORDER — ISOSORBIDE MONONITRATE ER 30 MG PO TB24: 15.0000 mg | ORAL_TABLET | Freq: Every day | ORAL | 3 refills | Status: AC

## 2024-09-17 NOTE — Progress Notes (Signed)
 Remote ICD Transmission

## 2024-09-17 NOTE — Telephone Encounter (Signed)
 I called the patient and discussed the results of his recent nuclear stress test.  Results as below: There is evidence of ischemia. There is no evidence of infarction. Defect 1: There is a small defect with mild reduction in uptake present in the mid to basal inferolateral location(s) that is reversible. There is abnormal wall motion in the defect area. Consistent with ischemia.   Left ventricular function is abnormal. Nuclear stress EF: 39%. The left ventricular ejection fraction is moderately decreased (30-44%). End diastolic cavity size is normal.   He also had an echocardiogram which showed an ejection fraction of 60 to 65% with no regional wall motion abnormalities which is more accurate than the nuclear stress wall motion and ejection fraction assessment.   Given these findings and his symptoms of stable angina, recent left heart catheterization 1 year ago showing patent stents with nonobstructive CAD and the results of the ISCHEMIA trial, we will pursue a medication management strategy first without pursuing invasive strategy.  I will start him on low-dose Imdur 15 mg daily given his soft blood pressure after our last visit.  He was advised not to take this medication within 48 hours of taking Cialis .  Continue on antiplatelet and statin therapy.  He has an appointment scheduled for next week with Dr. Almetta, EP.  Thank you, Emeline Calender, DO

## 2024-09-19 ENCOUNTER — Ambulatory Visit: Payer: Self-pay | Admitting: Family Medicine

## 2024-09-19 ENCOUNTER — Encounter: Payer: Self-pay | Admitting: Family Medicine

## 2024-09-19 ENCOUNTER — Ambulatory Visit: Admitting: Family Medicine

## 2024-09-19 VITALS — BP 122/64 | HR 75 | Temp 98.0°F | Wt 174.0 lb

## 2024-09-19 DIAGNOSIS — Z23 Encounter for immunization: Secondary | ICD-10-CM

## 2024-09-19 DIAGNOSIS — Z7984 Long term (current) use of oral hypoglycemic drugs: Secondary | ICD-10-CM | POA: Diagnosis not present

## 2024-09-19 DIAGNOSIS — I472 Ventricular tachycardia, unspecified: Secondary | ICD-10-CM | POA: Diagnosis not present

## 2024-09-19 DIAGNOSIS — N529 Male erectile dysfunction, unspecified: Secondary | ICD-10-CM

## 2024-09-19 DIAGNOSIS — E785 Hyperlipidemia, unspecified: Secondary | ICD-10-CM

## 2024-09-19 DIAGNOSIS — I10 Essential (primary) hypertension: Secondary | ICD-10-CM | POA: Diagnosis not present

## 2024-09-19 DIAGNOSIS — E1165 Type 2 diabetes mellitus with hyperglycemia: Secondary | ICD-10-CM

## 2024-09-19 LAB — POCT GLYCOSYLATED HEMOGLOBIN (HGB A1C): Hemoglobin A1C: 6.7 % — AB (ref 4.0–5.6)

## 2024-09-19 MED ORDER — EMPAGLIFLOZIN 25 MG PO TABS
25.0000 mg | ORAL_TABLET | Freq: Every day | ORAL | 0 refills | Status: DC
Start: 2024-09-19 — End: 2024-09-20

## 2024-09-19 MED ORDER — TADALAFIL 20 MG PO TABS: 10.0000 mg | ORAL_TABLET | Freq: Every day | ORAL | 11 refills | Status: DC | PRN

## 2024-09-19 MED ORDER — METFORMIN HCL 500 MG PO TABS: 500.0000 mg | ORAL_TABLET | Freq: Every day | ORAL | 1 refills | Status: DC

## 2024-09-19 MED ORDER — ATORVASTATIN CALCIUM 10 MG PO TABS: 10.0000 mg | ORAL_TABLET | Freq: Every day | ORAL | 2 refills | Status: DC

## 2024-09-19 MED ORDER — OLMESARTAN MEDOXOMIL-HCTZ 20-12.5 MG PO TABS: 1.0000 | ORAL_TABLET | Freq: Every day | ORAL | 2 refills | Status: DC

## 2024-09-19 NOTE — Patient Instructions (Signed)
 Thank you for coming in today. No change in medications at this time.  Keep follow-up with the cardiac specialist as planned.  If there are any concerns on your labwork, I will let you know. Take care!

## 2024-09-19 NOTE — Progress Notes (Signed)
 Subjective:  Patient ID: Dakota Gutierrez, male    DOB: Jul 17, 1951  Age: 73 y.o. MRN: [id number]  CC:  Chief Complaint  Patient presents with   Medical Management of Chronic Issues   Hospitalization Follow-up    [facility] [place], [place]; followed up with cardiology Monday    HPI DEUNTE BLEDSOE presents for hospital follow-up and follow-up of chronic conditions.  I last saw him in December 2024.  Denies barriers to care since that time. Just had to reschedule.   Diabetes: Complicated by hyperglycemia, with stable A1c last December.  Treated with metformin  500 mg every day, Jardiance  25 mg daily along with statin and ARB.  Prior treated genital rash last year.  Clear at his December visit. Denies new side effects with medications, no recent mycotic infection sx's.  Home readings:   Microalbumin: due Optho, foot exam, pneumovax:   Lab Results  Component Value Date   HGBA1C 6.4 11/06/2023   HGBA1C 6.9 (H) 03/22/2023   HGBA1C 8.4 (H) 11/17/2022   Lab Results  Component Value Date   MICROALBUR 0.7 09/15/2016   LDLCALC 46 09/10/2024   CREATININE 1.06 08/26/2024   Cardiac History of ventricular tachycardia, treated by Dr. Fernande previously along with coronary artery disease with stepwise intervention with stenting when discussed last year.  Recurrent V. tach despite amiodarone , catheter ablation in September of last year with subsequent V. tach.  On dual antiplatelet therapy as of last year, hypertension treated with Benicar  HCT and Toprol .  He was seen by pulmonary in September due to dyspnea, with reported normal PFTs and CT scan with mild scarring not causing functional impairment.  Possible early ILD but not impressive CT findings.  CT did not indicate likely amiodarone  toxicity.  Possible pulmonary hypertension deconditioning or cardiac causes of dyspnea but no evidence of COPD or asthma.  Cardiology follow-up planned.  Cardiology appointment noted from October 14.  Note  reviewed, recently in [place]  and received up to 10 ICD shocks.  Possible inappropriate shocks with history of slow VT and zones reduced to a heart rate of 107.  Had a VT ablation but no adjustment of the zones.  Prior shocks were delivered during simple walk with elevated heart rate.  Has continued on amiodarone  and metoprolol  with adjustment of ICD by pacer nurse.  Deferred med changes to electrophysiology.  Continued on aspirin , Brilinta , atorvastatin  and metoprolol  with CAD, widely patent LAD and RCA stents in 2024.  Echo planned in November.  Plan to have done sooner.  Ischemic workup planned with pharmacologic SPECT.  09/17/2024 result note reviewed from Dr. Kriste regarding recent nuclear stress test that showed evidence of ischemia but no evidence of infarction.  Nuclear stress EF 39%.  Echo with EF 60 to 65% without regional wall motion abnormalities which was more accurate the nuclear stress wall motion and EF assessment.  Based on these results and his symptoms, plan for medication management strategy first with low-dose Imdur 15 mg daily, avoidance of Cialis  within 48 hours of that med and continued on antiplatelet and statin therapy.  EP visit next week. - Dr. Almetta.   Has not yet started imdur - ready at pharmacy - plans to start.   Rare need for Cialis  - aware of need to avoid combo with IMDUR within 2 days.   TInnitus: Prior history of tinnitus, evaluated by ENT and audiology with sensorineural hearing loss bilaterally, hearing aids cost prohibitive. Same sx's.   Prior elevated LFTs possibly due to amiodarone  previously.  Recent LFTs normal. 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   Hypertension associated with type 2 diabetes mellitus (HCC) 11/14/2019   Type II diabetes mellitus (HCC) 01/16/2015   Hypersomnolence 08/14/2012   History of Helicobacter pylori infection 04/16/2012   GERD (gastroesophageal reflux disease) 04/16/2012   Elevated transaminase level 02/02/2012   Obesity 12/29/2011   Dual implantable cardioverter-defibrillator - Medtronic 09/06/2011   Syncope    Ventricular tachycardia (HCC)    Anxiety    Past Medical History:  Diagnosis Date   Anxiety    secondary to ICD shocks   Diabetes mellitus without complication (HCC)    Dual implantable cardiac defibrillator in situ    Medtronic [id number] Protecta   Elevated transaminase level    10/2011  ? 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   Vitreomacular adhesion of right eye 02/15/2021   Past Surgical History:  Procedure Laterality Date   CARDIAC DEFIBRILLATOR PLACEMENT     Medtronic [id number] Protecta   CATARACT EXTRACTION, BILATERAL  2019   CHOLECYSTECTOMY     COLONOSCOPY WITH PROPOFOL  N/A 06/30/2017   Procedure: COLONOSCOPY WITH PROPOFOL ;  Surgeon: Rollin Dover, MD;  Location: [facility];  Service: Endoscopy;  Laterality: N/A;   CORONARY PRESSURE/FFR STUDY N/A 02/06/2023   Procedure: INTRAVASCULAR PRESSURE WIRE/FFR STUDY;  Surgeon: Court Dorn PARAS, MD;  Location: [facility] INVASIVE CV LAB;  Service: Cardiovascular;  Laterality: N/A;   CORONARY STENT INTERVENTION N/A 02/06/2023   Procedure: CORONARY STENT INTERVENTION;  Surgeon: Court Dorn PARAS, MD;  Location: [facility] INVASIVE CV LAB;  Service: Cardiovascular;   Laterality: N/A;   CORONARY STENT INTERVENTION N/A 02/08/2023   Procedure: CORONARY STENT INTERVENTION;  Surgeon: Wonda Sharper, MD;  Location: [facility] INVASIVE CV LAB;  Service: Cardiovascular;  Laterality: N/A;   GALLBLADDER SURGERY     HERNIA REPAIR     ICD GENERATOR CHANGEOUT N/A 04/26/2017   Procedure: ICD Generator Changeout;  Surgeon: Fernande Elspeth BROCKS, MD;  Location: [facility] INVASIVE CV LAB;  Service: Cardiovascular;  Laterality: N/A;   LEFT HEART CATH AND CORONARY ANGIOGRAPHY N/A 02/06/2023   Procedure: LEFT HEART CATH AND CORONARY ANGIOGRAPHY;  Surgeon: Court Dorn PARAS, MD;  Location: [facility]VASIVE CV LAB;  Service: Cardiovascular;  Laterality: N/A;   LEFT HEART CATH AND CORONARY ANGIOGRAPHY N/A 06/09/2023   Procedure: LEFT HEART CATH AND CORONARY ANGIOGRAPHY;  Surgeon: Swazil[place], Peter M, MD;  Location: [facility] INVASIVE CV LAB;  Service: Cardiovascular;  Laterality: N/A;   TONSILLECTOMY AND ADENOIDECTOMY     Allergies  Allergen Reactions   Adhesive [Tape]     Swelling and welts formed   Prior to Admission medications   Medication Sig Start Date End Date Taking? Authorizing Provider  acetaminophen  (TYLENOL ) 500 MG tablet Take 1,000 mg by mouth daily as  needed for moderate pain or headache.   Yes [provider]  ALPRAZolam  (XANAX ) 0.25 MG tablet Take 1 tablet (0.25 mg total) by mouth 2 (two) times daily as needed for anxiety. 02/09/23  Yes Lesia Ozell Barter, PA-C  amiodarone  (PACERONE ) 200 MG tablet Take 0.5 tablets (100 mg total) by mouth daily. 08/01/24  Yes Lesia Ozell Barter, PA-C  aspirin  EC 81 MG tablet Take 81 mg by mouth daily at 12 noon. 11/11/11  Yes Fernande Elspeth BROCKS, MD  atorvastatin  (LIPITOR) 10 MG tablet Take 1 tablet (10 mg total) by mouth daily. 11/06/23  Yes Levora Reyes SAUNDERS, MD  Cholecalciferol (VITAMIN D ) 2000 UNITS tablet Take 2,000 Units by mouth daily at 12 noon.   Yes [provider]  Continuous Glucose Sensor (FREESTYLE LIBRE 3 SENSOR) MISC 1 Application  by Does not apply route every 14 (fourteen) days. Place 1 sensor on the skin every 14 days. Use to check glucose continuously 11/06/23  Yes Levora Reyes SAUNDERS, MD  empagliflozin  (JARDIANCE ) 25 MG TABS tablet TAKE 1 TABLET BY MOUTH DAILY BEFORE BREAKFAST. 08/30/24  Yes Levora Reyes SAUNDERS, MD  famotidine  (PEPCID ) 20 MG tablet Take 1 tablet (20 mg total) by mouth 2 (two) times daily as needed. 04/16/12  Yes Pyrtle, Gordy HERO, MD  isosorbide mononitrate (IMDUR) 30 MG 24 hr tablet Take 0.5 tablets (15 mg total) by mouth daily. 09/17/24 12/16/24 Yes Segal, Jared E, MD  metFORMIN  (GLUCOPHAGE ) 500 MG tablet TAKE 1 TABLET (500 MG TOTAL) BY MOUTH DAILY WITH BREAKFAST. 07/19/24  Yes Levora Reyes SAUNDERS, MD  metoprolol  succinate (TOPROL -XL) 100 MG 24 hr tablet Take 1 tablet (100 mg total) by mouth every morning. 02/07/24  Yes Fernande Elspeth BROCKS, MD  olmesartan -hydrochlorothiazide (BENICAR  HCT) 20-12.5 MG tablet Take 1 tablet by mouth daily. 11/06/23  Yes Levora Reyes SAUNDERS, MD  Omega-3 Fatty Acids (FISH OIL) 1000 MG CAPS Take 1,000 mg by mouth 2 (two) times daily.   Yes [provider]  tadalafil  (CIALIS ) 20 MG tablet Take 0.5-1 tablets (10-20 mg total) by mouth daily as needed for erectile dysfunction. 09/15/16  Yes Levora Reyes SAUNDERS, MD  ticagrelor  (BRILINTA ) 90 MG TABS tablet Take 1 tablet (90 mg total) by mouth 2 (two) times daily. 04/19/24  Yes Fernande Elspeth BROCKS, MD   Social History   Socioeconomic History   Marital status: Married    Spouse name: Not on file   Number of children: 0   Years of education: Not on file   Highest education level: Bachelor's degree (e.g., BA, AB, BS)  Occupational History   Occupation: Retired Runner, broadcasting/film/video  Tobacco Use   Smoking status: Never   Smokeless tobacco: Never  Vaping Use   Vaping status: Never Used  Substance and Sexual Activity   Alcohol use: Yes    Alcohol/week: 2.0 standard drinks of alcohol    Types: 2 Cans of beer per week    Comment: 4/week   Drug use: No   Sexual  activity: Yes  Other Topics Concern   Not on file  Social History Narrative   Divorced. Education: [employer]. Exercise: walking 3 times a week for 30 minutes.   Retired Runner, broadcasting/film/video   Social Drivers of Corporate investment banker Strain: Low Risk  (03/03/2022)   Overall Financial Resource Strain (CARDIA)    Difficulty of Paying Living Expenses: Not hard at all  Food Insecurity: No Food Insecurity (04/19/2023)   Hunger Vital Sign    Worried About Running Out of Food in  the Last Year: Never true    Ran Out of Food in the Last Year: Never true  Transportation Needs: No Transportation Needs (04/19/2023)   PRAPARE - Administrator, Civil Service (Medical): No    Lack of Transportation (Non-Medical): No  Physical Activity: Insufficiently Active (04/19/2023)   Exercise Vital Sign    Days of Exercise per Week: 4 days    Minutes of Exercise per Session: 30 min  Stress: No Stress Concern Present (03/03/2022)   [employer] of Occupational Health - Occupational Stress Questionnaire    Feeling of Stress : Not at all  Social Connections: Moderately Isolated (04/19/2023)   Social Connection and Isolation Panel    Frequency of Communication with Friends and Family: More than three times a week    Frequency of Social Gatherings with Friends and Family: Three times a week    Attends Religious Services: More than 4 times per year    Active Member of Clubs or Organizations: No    Attends Banker Meetings: Never    Marital Status: Divorced  Catering manager Violence: Not At Risk (04/19/2023)   Humiliation, Afraid, Rape, and Kick questionnaire    Fear of Current or Ex-Partner: No    Emotionally Abused: No    Physically Abused: No    Sexually Abused: No    Review of Systems Per HPI   Objective:   Vitals:   09/19/24 1124  BP: 122/64  Pulse: 75  Temp: 98 F (36.7 C)  SpO2: 98%  Weight: 174 lb (78.9 kg)     Physical Exam Vitals reviewed.  Constitutional:       Appearance: He is well-developed.  HENT:     Head: Normocephalic and atraumatic.  Neck:     Vascular: No carotid bruit or JVD.  Cardiovascular:     Rate and Rhythm: Normal rate and regular rhythm.     Heart sounds: Normal heart sounds. No murmur heard. Pulmonary:     Effort: Pulmonary effort is normal.     Breath sounds: Normal breath sounds. No rales.  Musculoskeletal:     Right lower leg: No edema.     Left lower leg: No edema.  Skin:    General: Skin is warm and dry.  Neurological:     Mental Status: He is alert and oriented to person, place, and time.  Psychiatric:        Mood and Affect: Mood normal.    Results for orders placed or performed in visit on 09/19/24  POCT glycosylated hemoglobin (Hb A1C)   Collection Time: 09/19/24 12:41 PM  Result Value Ref Range   Hemoglobin A1C 6.7 (A) 4.0 - 5.6 %   HbA1c POC (<> result, manual entry)     HbA1c, POC (prediabetic range)     HbA1c, POC (controlled diabetic range)         Assessment & Plan:  JARID SASSO is a 73 y.o. male . Type 2 diabetes mellitus with hyperglycemia, without long-term current use of insulin  (HCC) - Plan: atorvastatin  (LIPITOR) 10 MG tablet, empagliflozin  (JARDIANCE ) 25 MG TABS tablet, metFORMIN  (GLUCOPHAGE ) 500 MG tablet, Microalbumin / creatinine urine ratio, POCT glycosylated hemoglobin (Hb A1C)  - Tolerating current med regimen, reasonable control with A1c 6.7 today.  No changes..  Check urine microalbumin.  Hyperlipidemia, unspecified hyperlipidemia type - Plan: atorvastatin  (LIPITOR) 10 MG tablet  - CAD as above with cardiology close follow-up, medical management planned with recent addition of Imdur.  Continue follow-up with  cardiology, electrophysiology.  Cautioned to not use Cialis  with Imdur.  Will stop that medication at this time  Essential hypertension - Plan: olmesartan -hydrochlorothiazide (BENICAR  HCT) 20-12.5 MG tablet  - Stable with current regimen, continue cardiology follow-up as  above.  Erectile dysfunction, unspecified erectile dysfunction type  - With recent start of nitrate, will need to avoid use of Cialis  at this time initially had placed refill, canceled at pharmacy.  Ventricular tachycardia (HCC)  - Recent adjustment electrophysiology appointment next week, continue follow-up, no changes at this time.  Needs flu shot - Plan: Flu vaccine HIGH DOSE PF(Fluzone Trivalent)   Meds ordered this encounter  Medications   atorvastatin  (LIPITOR) 10 MG tablet    Sig: Take 1 tablet (10 mg total) by mouth daily.    Dispense:  90 tablet    Refill:  2   empagliflozin  (JARDIANCE ) 25 MG TABS tablet    Sig: Take 1 tablet (25 mg total) by mouth daily before breakfast.    Dispense:  30 tablet    Refill:  0    Patient needs an appt for further refills 08/30/2024   metFORMIN  (GLUCOPHAGE ) 500 MG tablet    Sig: Take 1 tablet (500 mg total) by mouth daily with breakfast.    Dispense:  90 tablet    Refill:  1   olmesartan -hydrochlorothiazide (BENICAR  HCT) 20-12.5 MG tablet    Sig: Take 1 tablet by mouth daily.    Dispense:  90 tablet    Refill:  2   tadalafil  (CIALIS ) 20 MG tablet    Sig: Take 0.5-1 tablets (10-20 mg total) by mouth daily as needed for erectile dysfunction.    Dispense:  10 tablet    Refill:  11    Can place on hold if not needed currently.   There are no Patient Instructions on file for this visit.    Signed,   Reyes Pines, MD [facility], [facility] 09/19/24 12:23 PM

## 2024-09-20 ENCOUNTER — Telehealth: Payer: Self-pay

## 2024-09-20 DIAGNOSIS — E785 Hyperlipidemia, unspecified: Secondary | ICD-10-CM

## 2024-09-20 DIAGNOSIS — I10 Essential (primary) hypertension: Secondary | ICD-10-CM

## 2024-09-20 DIAGNOSIS — E1165 Type 2 diabetes mellitus with hyperglycemia: Secondary | ICD-10-CM

## 2024-09-20 LAB — MICROALBUMIN / CREATININE URINE RATIO
Creatinine,U: 73.9 mg/dL
Microalb Creat Ratio: UNDETERMINED mg/g (ref 0.0–30.0)
Microalb, Ur: <0.7 mg/dL

## 2024-09-20 MED ORDER — ATORVASTATIN CALCIUM 10 MG PO TABS: 10.0000 mg | ORAL_TABLET | Freq: Every day | ORAL | 2 refills | Status: AC

## 2024-09-20 MED ORDER — METFORMIN HCL 500 MG PO TABS: 500.0000 mg | ORAL_TABLET | Freq: Every day | ORAL | 1 refills | Status: AC

## 2024-09-20 MED ORDER — OLMESARTAN MEDOXOMIL-HCTZ 20-12.5 MG PO TABS: 1.0000 | ORAL_TABLET | Freq: Every day | ORAL | 2 refills | Status: AC

## 2024-09-20 MED ORDER — EMPAGLIFLOZIN 25 MG PO TABS: 25.0000 mg | ORAL_TABLET | Freq: Every day | ORAL | 0 refills | Status: DC

## 2024-09-20 NOTE — Telephone Encounter (Signed)
 Medications have been resent to the correct pharmacy   Copied from CRM [id number]. Topic: Clinical - Prescription Issue >> Sep 20, 2024 10:53 AM Burnard DEL wrote: Reason for RMF:Ejupzwu called in stating that   empagliflozin  (JARDIANCE ) 25 MG TABS tablet  olmesartan -hydrochlorothiazide (BENICAR  HCT) 20-12.5 MG tablet   metFORMIN  (GLUCOPHAGE ) 500 MG tablet   atorvastatin  (LIPITOR) 10 MG tablet   Were supposed to be sent to   [id number] - [place], [place] - [address]  Phone: [phone] Fax: [phone] for a 90 day refill

## 2024-09-21 ENCOUNTER — Ambulatory Visit: Payer: Self-pay | Admitting: Student in an Organized Health Care Education/Training Program

## 2024-09-22 ENCOUNTER — Ambulatory Visit: Payer: Self-pay | Admitting: Student in an Organized Health Care Education/Training Program

## 2024-09-22 NOTE — Progress Notes (Unsigned)
 Cardiology Office Note   Date:  09/23/2024  ID:  DIN BOOKWALTER, DOB 1951/02/12, MRN [id number] PCP: Levora Reyes SAUNDERS, MD  [facility] Providers Cardiologist:  Emeline FORBES Calender, MD Electrophysiologist:  Donnice DELENA Primus, MD   History of Present Illness Dakota Gutierrez is a 73 y.o. 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   Mr. Anaya has a long history of recurrent VT back to 2012.  Following VT storm he was started on amiodarone  and had elevated LFTs in 2013 and was evaluated by GI with normalization of his transaminases.  He had recurrent VTE in June 2022 requiring ICD therapy.  He had another episode in 08/2022 requiring ATP.  He was hospitalized in March 2024 with VT storm incessant VT which quieted with amiodarone  and lidocaine .  He underwent repeat coronary evaluation at that time and had significant worsening of his prior known CAD with 3V involvement following distant prior LAD PCI.  He ultimately underwent mRCA PCI during that hospitalization.  He had recurrent slow VT in April 2024 that terminated spontaneously and again in July 2024 and was referred to Dr. Genna and had ablation 07/2023.  History of Present Illness Dakota Gutierrez is a 73 year old male with ventricular tachycardia and ICD shocks who presents with inappropriate ICD shocks.  He experienced ICD shocks while on vacation in [place] . The first shock occurred while walking out of a bathroom at a [place] resort, causing him to double over. Two more shocks followed shortly after. He was unable to drive due to the shocks and required a taxi to return to his accommodation. An ambulance was called, and he was taken to the hospital where routine testing  was performed.  His ICD was initially set at 107 bpm and was later adjusted to 120 bpm. Despite this adjustment, he experienced another shock when his heart rate reached 130 bpm while leaving a hotel in [place],  . He questions why he did not experience shocks during physical activities in the past when his heart rate was likely elevated.  He describes experiencing chest pain the previous night, which felt like a sore muscle in the heart area. He also feels lightheaded and nauseous during the visit, attributing it to the warm environment. He mentions waking up with a swollen knee.  He is currently taking amiodarone  at a dose of 100 mg per day, which he has been on for a long time. He recalls being on a higher dose previously but experienced side effects. He also mentions taking isosorbide mononitrate, which caused dizziness and flushing, leading him to discontinue it. He has a history of being on various medications, including lidocaine  and mexiletine, prior to his ablation procedure.  He has a history of ventricular tachycardia and underwent an ablation. He was previously on amiodarone  and lidocaine  before the ablation. He is concerned about the recurrence of VT episodes and the potential need for further intervention.  ROS: ICD shocks   Studies Reviewed  SPECT Result date: 09/16/24   LV perfusion is abnormal. There is evidence of ischemia. There is no evidence of infarction. Defect 1: There is a small defect with mild reduction in uptake present in the mid to basal inferolateral location(s) that is  reversible. There is abnormal wall motion in the defect area. Consistent with ischemia.   Left ventricular function is abnormal. Nuclear stress EF: 39%. The left ventricular ejection fraction is moderately decreased (30-44%). End diastolic cavity size is normal.   Coronary calcium  assessment not performed due to prior revascularization.   Findings are consistent with ischemia. The study  is intermediate risk.  TTE Result date: 09/16/24  1. Left ventricular ejection fraction, by estimation, is 60 to 65%. The  left ventricle has normal function. The left ventricle has no regional  wall motion abnormalities. Left ventricular diastolic parameters are  indeterminate.   2. Right ventricular systolic function is normal. The right ventricular  size is mildly enlarged. Tricuspid regurgitation signal is inadequate for  assessing PA pressure.   3. The mitral valve is normal in structure. Trivial mitral valve  regurgitation. No evidence of mitral stenosis.   4. The aortic valve is tricuspid. Aortic valve regurgitation is not  visualized. Aortic valve sclerosis/calcification is present, without any  evidence of aortic stenosis.   5. Aortic dilatation noted. There is mild dilatation of the ascending  aorta, measuring 38 mm.   6. The inferior vena cava is normal in size with greater than 50%  respiratory variability, suggesting right atrial pressure of 3 mmHg.   ECG review 03/20/24: APVS 76, PR 232, QRS 114, QT/c 418/470 09/29/23: AP/VS 75, PR 276, QRS 120, QT/c 418/466  VT Ablation Surgeon: Belvie HERO. Hranitzky, MD Assistant: Morene HERO Chilcutt, MD Medications: Per anesthesia summary  VT1: TCL , right bundle branch morphology, inferior axis, positive precordial concordance, biphasic in lead I VT2: TCL , left bundle branch block morphology, IVCD, inferior axis, transition at V3 Case summary: An EP study was performed with assessment of ventricular, atrial and AV nodal refractory periods. Arrhythmia induction was attempted with programmed stimulation from the right ventricular apex. Monomorphic ventricular tachycardia (VT1) with a cycle length of 310 msec was induced with double ventricular extrastimulus.  VT1: TCL , right bundle branch morphology, inferior axis, positive precordial concordance, biphasic in lead I A Octarray mapping catheter was inserted into the LV  via retroaortic approach. This catheter was used to construct a 3D anatomic map of the LV endocardium using CARTO hardware and software. Mapping of the left ventricle was performed in sinus rhythm and simulataneous isochronal late-activation mapping and bipolar voltage mapping were performed. The voltage mapping demonstrated largely healthy voltage throughout the left ventricle with possible area of only mildly decreased voltage anterior to the [facility].  Activation mapping of VT1 demonstrated an early site of activation with QS unipolar pattern at the [facility] region.  The Octarray catheter was exchanged for the F-curve Thermocool Smarttouch mapping/ablation catheter and the SmartTouch catheter was zeroed under intracardiac echocardiographic guidance. Using the [employer], a PaSo correlation map was created in the left ventricle. Pace mapping was performed 98% PASO pace match was recorded in the [facility]. Radiofrequency ablation lesions were placed at the area using a power of 40 Watts, with real-time monitoring of cardiac rhythm, ST-segment morphology, local electrograms, catheter-tip temperature, tissue impedance and contact force. Additional ablation was performed to consolidate this area. Following these ablation lesions, VT1 was no longer seen.  Programmed stimulation was then repeated with </= double extrastimuli and there was a second VT2 sustained arrhythmia.  VT2: TCL , left bundle branch block morphology, IVCD, inferior axis, transition at V3 The Octaray was advanced to the left ventricle and activation mapping was peformed, showing and early site of activation near  below the right coronary cusp. PASO match was 84% in this region. Ablation lesions were placed here with the STSF catheter during VT, which resulted in termination to sinus rhythm, however, VT was reinducible. The ablation catheter was removed from the left side and then advanced through the 8.5 F venous sheath to the right ventricular outflow  tract. PASO match from this location opposite to the LV lesion set was 89%. Ablation during VT in this area resulted in termination to sinus rhythm. Ablation was performed in the RVOT at power of 35 watts. At the end of the study, ICE showed no pericardial effusion. The catheters were then removed from the sheaths and heparin  was reversed with protamine. The long sheaths were exchanged for short sheaths and the venous access sites were closed using VASCADE MVP devices. The arterial access was closed using a Mynx device. The patient was awakened from anesthesia and taken to recovery in a stable condition. The patient's ICD tachycardia therapies and original device parameters were reactivated. There were no apparent complications at the time the patient left the EP lab.  VT1 PASO and activation map VT1 earliest EGM VT2 best PASO from RVOT VT2 earliest EGM from RVOT Ablation lesion set for VT1 and VT2   Physical Exam VS:  BP (!) 97/52   Pulse 69   Ht 5' 5 (1.651 m)   Wt 175 lb (79.4 kg)   SpO2 96%   BMI 29.12 kg/m       Wt Readings from Last 3 Encounters:  09/23/24 175 lb (79.4 kg)  09/19/24 174 lb (78.9 kg)  09/10/24 175 lb 3.2 oz (79.5 kg)    GEN: Well nourished, well developed in no acute distress CARDIAC: RRR, no murmurs, rubs, gallops RESPIRATORY:  Clear to auscultation without rales, wheezing or rhonchi  EXTREMITIES:  No edema; No deformity   ASSESSMENT AND PLAN Dakota Gutierrez is a 73 y.o. 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   Recurrent VT s/p ICD tx ICM s/p LAD and RCA PCI Prior VT ablation  Reviewed several episodes of VT with TCL from 300-450. ATP on some episodes would break the arrhythmia for 1-2 beats however it would  then resume. Ultimately ICD therapies were all successful.  Reviewed his prior EP study and VT ablation where endocardial voltage was largely normal and ablation was performed in the [facility] region along with the right coronary cusp.  His most recent TTE on 09/16/2024 with preserved LVEF.  Myoview on the same day with moderately decreased function and a small reversible defect in the mid to basal inferolateral LV.  Based off his prior voltage map I am concerned that his multiple VT morphologies are more consistent with a nonischemic substrate that is either mid myocardial or epicardial.  We will increase his amiodarone  from 100 mg daily to 200 mg daily and add mexiletine 200 mg twice daily.  If he continues to have recurrent episodes of symptomatic VT then will obtain cardiac MRI and evaluate for redo ablation.     Dispo: RTC 3 months.   A total of 50 minutes was spent preparing for the patient, reviewing history, performing exam, document encounter, coordinating care and counseling the patient. 33 minutes was spent with direct patient  care.   Signed, Donnice DELENA Primus, MD

## 2024-09-23 ENCOUNTER — Encounter: Payer: Self-pay | Admitting: Student in an Organized Health Care Education/Training Program

## 2024-09-23 ENCOUNTER — Ambulatory Visit
Attending: Student in an Organized Health Care Education/Training Program | Admitting: Student in an Organized Health Care Education/Training Program

## 2024-09-23 VITALS — BP 97/52 | HR 69 | Ht 65.0 in | Wt 175.0 lb

## 2024-09-23 DIAGNOSIS — Z95810 Presence of automatic (implantable) cardiac defibrillator: Secondary | ICD-10-CM

## 2024-09-23 DIAGNOSIS — I472 Ventricular tachycardia, unspecified: Secondary | ICD-10-CM | POA: Diagnosis not present

## 2024-09-23 DIAGNOSIS — M25561 Pain in right knee: Secondary | ICD-10-CM | POA: Diagnosis not present

## 2024-09-23 MED ORDER — MEXILETINE HCL 200 MG PO CAPS: 200.0000 mg | ORAL_CAPSULE | Freq: Two times a day (BID) | ORAL | 3 refills | Status: AC

## 2024-09-23 NOTE — Patient Instructions (Addendum)
 Medication Instructions:  Your physician has recommended you make the following change in your medication:  Increase amiodarone  200mg  daily Start mexiletine 200mg  twice daily  Lab Work: None ordered.  You may go to any [facility] Location for your lab work:  [facility] - [address] ([facility]) - [address] [phone] [address]  [place] - [address]   [place]  - [address]   [place] - [address] - [address] [place]  - [address] ([facility]   If you have labs (blood work) drawn today and your tests are completely normal, you will receive your results only by: [employer] (if you have MyChart)  If you have any lab test that is abnormal or we need to change your treatment, we will call you or send a MyChart message to review the results.  Testing/Procedures: None ordered.  Follow-Up: At [facility], you and your health needs are our priority.  As part of our continuing mission to provide you with exceptional heart care, we have created designated Provider Care Teams.  These Care Teams include your primary Cardiologist (physician) and Advanced Practice Providers (APPs -  Physician Assistants and Nurse Practitioners) who all work together to provide you with the care you need, when you need it.  We recommend signing up for the patient portal called MyChart.  Sign up information is provided on this After Visit Summary.  MyChart is used to connect with patients for Virtual Visits (Telemedicine).  Patients are able to view lab/test results, encounter notes, upcoming appointments, etc.  Non-urgent messages can be sent to your provider as well.   To learn more about what you can do with MyChart, go to [email or web address].    Your next appointment:   3 months  The format for your next appointment:   In  Person  Provider:   Donnice Primus, MD or one of the following Advanced Practice Providers on your designated Care Team:   Charlies Arthur, [place] Ozell Jodie Passey, [place] Leotis Barrack, NP  Note: Remote monitoring is used to monitor your Pacemaker/ ICD from home. This monitoring reduces the number of office visits required to check your device to one time per year. It allows us  to keep an eye on the functioning of your device to ensure it is working properly.

## 2024-09-26 ENCOUNTER — Encounter (HOSPITAL_COMMUNITY)

## 2024-10-04 ENCOUNTER — Ambulatory Visit: Payer: Self-pay | Admitting: Internal Medicine

## 2024-10-04 ENCOUNTER — Ambulatory Visit (HOSPITAL_COMMUNITY)

## 2024-10-12 ENCOUNTER — Ambulatory Visit: Attending: Internal Medicine

## 2024-10-14 LAB — CUP PACEART REMOTE DEVICE CHECK
Battery Remaining Longevity: 4 mo
Battery Voltage: 2.84 V
Brady Statistic AP VP Percent: 0.04 %
Brady Statistic AP VS Percent: 97.1 %
Brady Statistic AS VP Percent: 0.01 %
Brady Statistic AS VS Percent: 2.85 %
Brady Statistic RA Percent Paced: 96.27 %
Brady Statistic RV Percent Paced: 0.05 %
Date Time Interrogation Session: 20251115012304
HighPow Impedance: 41 Ohm
HighPow Impedance: 52 Ohm
Implantable Lead Connection Status: 753985
Implantable Lead Connection Status: 753985
Implantable Lead Implant Date: 20120531
Implantable Lead Implant Date: 20120531
Implantable Lead Location: 753859
Implantable Lead Location: 753860
Implantable Lead Model: 4076
Implantable Lead Model: 6947
Implantable Pulse Generator Implant Date: 20180530
Lead Channel Impedance Value: 361 Ohm
Lead Channel Impedance Value: 418 Ohm
Lead Channel Impedance Value: 456 Ohm
Lead Channel Pacing Threshold Amplitude: 0.75 V
Lead Channel Pacing Threshold Amplitude: 0.75 V
Lead Channel Pacing Threshold Pulse Width: 0.4 ms
Lead Channel Pacing Threshold Pulse Width: 0.4 ms
Lead Channel Sensing Intrinsic Amplitude: 0.875 mV
Lead Channel Sensing Intrinsic Amplitude: 0.875 mV
Lead Channel Sensing Intrinsic Amplitude: 12.625 mV
Lead Channel Sensing Intrinsic Amplitude: 12.625 mV
Lead Channel Setting Pacing Amplitude: 1.5 V
Lead Channel Setting Pacing Amplitude: 2 V
Lead Channel Setting Pacing Pulse Width: 0.4 ms
Lead Channel Setting Sensing Sensitivity: 0.3 mV
Zone Setting Status: 755011
Zone Setting Status: 755011

## 2024-10-15 DIAGNOSIS — M1711 Unilateral primary osteoarthritis, right knee: Secondary | ICD-10-CM | POA: Diagnosis not present

## 2024-10-29 ENCOUNTER — Other Ambulatory Visit: Payer: Self-pay | Admitting: Family Medicine

## 2024-10-29 DIAGNOSIS — E1165 Type 2 diabetes mellitus with hyperglycemia: Secondary | ICD-10-CM

## 2024-10-29 NOTE — Telephone Encounter (Unsigned)
 Copied from CRM #[id number]. Topic: Clinical - Medication Refill >> Oct 29, 2024 11:07 AM Franky GRADE wrote: Medication: empagliflozin  (JARDIANCE ) 25 MG TABS tablet [[id number]] 90 day supply  Has the patient contacted their pharmacy? Yes, patient is calling because at his last appointment on 09/19/2024 Dr.Greene stated he would refill for 90 days; however, the pharmacy only has a 30 day prescription for the patient.  (Agent: If no, request that the patient contact the pharmacy for the refill. If patient does not wish to contact the pharmacy document the reason why and proceed with request.) (Agent: If yes, when and what did the pharmacy advise?)  This is the patient's preferred pharmacy:    [facility] [phone] [address], [place] - [address]  [place] [address] Phone: [phone] Fax: [phone]  Is this the correct pharmacy for this prescription? Yes If no, delete pharmacy and type the correct one.   Has the prescription been filled recently? No  Is the patient out of the medication? No  Has the patient been seen for an appointment in the last year OR does the patient have an upcoming appointment? Yes  Can we respond through MyChart? No, phone call.   Agent: Please be advised that Rx refills may take up to 3 business days. We ask that you follow-up with your pharmacy.

## 2024-10-29 NOTE — Telephone Encounter (Signed)
 Yes, patient is calling because at his last appointment on 09/19/2024 Dr.Greene stated he would refill for 90 days; however, the pharmacy only has a 30 day prescription for the patient.

## 2024-10-30 MED ORDER — EMPAGLIFLOZIN 25 MG PO TABS: 25.0000 mg | ORAL_TABLET | Freq: Every day | ORAL | 1 refills | Status: AC

## 2024-11-05 ENCOUNTER — Telehealth: Payer: Self-pay | Admitting: Internal Medicine

## 2024-11-05 ENCOUNTER — Ambulatory Visit: Payer: Self-pay | Admitting: Student in an Organized Health Care Education/Training Program

## 2024-11-05 ENCOUNTER — Other Ambulatory Visit: Payer: Self-pay | Admitting: Family Medicine

## 2024-11-05 DIAGNOSIS — I472 Ventricular tachycardia, unspecified: Secondary | ICD-10-CM

## 2024-11-05 DIAGNOSIS — E1165 Type 2 diabetes mellitus with hyperglycemia: Secondary | ICD-10-CM

## 2024-11-07 ENCOUNTER — Telehealth: Payer: Self-pay | Admitting: Student in an Organized Health Care Education/Training Program

## 2024-11-07 DIAGNOSIS — I472 Ventricular tachycardia, unspecified: Secondary | ICD-10-CM

## 2024-11-07 MED ORDER — AMIODARONE HCL 200 MG PO TABS: 200.0000 mg | ORAL_TABLET | Freq: Every day | ORAL | 2 refills | Status: AC

## 2024-11-07 MED ORDER — METOPROLOL SUCCINATE ER 100 MG PO TB24: 100.0000 mg | ORAL_TABLET | Freq: Every morning | ORAL | 3 refills | Status: AC

## 2024-11-07 NOTE — Telephone Encounter (Signed)
 Refill sent

## 2024-11-07 NOTE — Telephone Encounter (Signed)
 Pt c/o medication issue:  1. Name of Medication: amiodarone  (PACERONE ) 200 MG tablet   2. How are you currently taking this medication (dosage and times per day)? 200mg  daily - 1 tablet a day   3. Are you having a reaction (difficulty breathing--STAT)? No   4. What is your medication issue? Pt called in stating this med was changed to 200mg  daily. Please advise if this can be changed and resent to the pharmacy.

## 2024-11-07 NOTE — Telephone Encounter (Signed)
 Pt called in stating he is completely out

## 2024-11-12 ENCOUNTER — Ambulatory Visit: Admitting: Pulmonary Disease

## 2024-11-12 ENCOUNTER — Ambulatory Visit: Attending: Internal Medicine

## 2024-11-14 LAB — CUP PACEART REMOTE DEVICE CHECK
Battery Remaining Longevity: 3 mo
Battery Voltage: 2.84 V
Brady Statistic AP VP Percent: 0.02 %
Brady Statistic AP VS Percent: 97.76 %
Brady Statistic AS VP Percent: 0.01 %
Brady Statistic AS VS Percent: 2.21 %
Brady Statistic RA Percent Paced: 97.36 %
Brady Statistic RV Percent Paced: 0.03 %
Date Time Interrogation Session: 20251216031705
HighPow Impedance: 40 Ohm
HighPow Impedance: 49 Ohm
Implantable Lead Connection Status: 753985
Implantable Lead Connection Status: 753985
Implantable Lead Implant Date: 20120531
Implantable Lead Implant Date: 20120531
Implantable Lead Location: 753859
Implantable Lead Location: 753860
Implantable Lead Model: 4076
Implantable Lead Model: 6947
Implantable Pulse Generator Implant Date: 20180530
Lead Channel Impedance Value: 304 Ohm
Lead Channel Impedance Value: 399 Ohm
Lead Channel Impedance Value: 475 Ohm
Lead Channel Pacing Threshold Amplitude: 0.75 V
Lead Channel Pacing Threshold Amplitude: 0.75 V
Lead Channel Pacing Threshold Pulse Width: 0.4 ms
Lead Channel Pacing Threshold Pulse Width: 0.4 ms
Lead Channel Sensing Intrinsic Amplitude: 1.25 mV
Lead Channel Sensing Intrinsic Amplitude: 1.25 mV
Lead Channel Sensing Intrinsic Amplitude: 12.875 mV
Lead Channel Sensing Intrinsic Amplitude: 12.875 mV
Lead Channel Setting Pacing Amplitude: 1.5 V
Lead Channel Setting Pacing Amplitude: 2 V
Lead Channel Setting Pacing Pulse Width: 0.4 ms
Lead Channel Setting Sensing Sensitivity: 0.3 mV
Zone Setting Status: 755011
Zone Setting Status: 755011

## 2024-11-22 ENCOUNTER — Ambulatory Visit: Payer: Self-pay | Admitting: Student in an Organized Health Care Education/Training Program

## 2024-12-11 ENCOUNTER — Ambulatory Visit: Payer: Medicare PPO

## 2024-12-11 ENCOUNTER — Ambulatory Visit: Payer: Self-pay | Admitting: Student in an Organized Health Care Education/Training Program

## 2024-12-11 LAB — CUP PACEART INCLINIC DEVICE CHECK
Battery Remaining Longevity: 4 mo
Battery Voltage: 2.83 V
Brady Statistic AP VP Percent: 0.03 %
Brady Statistic AP VS Percent: 94.96 %
Brady Statistic AS VP Percent: 0.01 %
Brady Statistic AS VS Percent: 4.99 %
Brady Statistic RA Percent Paced: 93.88 %
Brady Statistic RV Percent Paced: 0.05 %
Date Time Interrogation Session: 20251027160431
HighPow Impedance: 39 Ohm
HighPow Impedance: 51 Ohm
Implantable Lead Connection Status: 753985
Implantable Lead Connection Status: 753985
Implantable Lead Implant Date: 20120531
Implantable Lead Implant Date: 20120531
Implantable Lead Location: 753859
Implantable Lead Location: 753860
Implantable Lead Model: 4076
Implantable Lead Model: 6947
Implantable Pulse Generator Implant Date: 20180530
Lead Channel Impedance Value: 342 Ohm
Lead Channel Impedance Value: 418 Ohm
Lead Channel Impedance Value: 456 Ohm
Lead Channel Pacing Threshold Amplitude: 0.75 V
Lead Channel Pacing Threshold Amplitude: 0.75 V
Lead Channel Pacing Threshold Pulse Width: 0.4 ms
Lead Channel Pacing Threshold Pulse Width: 0.4 ms
Lead Channel Sensing Intrinsic Amplitude: 1.625 mV
Lead Channel Sensing Intrinsic Amplitude: 13.875 mV
Lead Channel Setting Pacing Amplitude: 1.5 V
Lead Channel Setting Pacing Amplitude: 2 V
Lead Channel Setting Pacing Pulse Width: 0.4 ms
Lead Channel Setting Sensing Sensitivity: 0.3 mV
Zone Setting Status: 755011
Zone Setting Status: 755011

## 2024-12-13 ENCOUNTER — Ambulatory Visit: Attending: Internal Medicine

## 2024-12-13 DIAGNOSIS — I428 Other cardiomyopathies: Secondary | ICD-10-CM | POA: Diagnosis not present

## 2024-12-13 LAB — CUP PACEART REMOTE DEVICE CHECK
Battery Remaining Longevity: 2 mo
Battery Voltage: 2.83 V
Brady Statistic AP VP Percent: 0.05 %
Brady Statistic AP VS Percent: 96.79 %
Brady Statistic AS VP Percent: 0.01 %
Brady Statistic AS VS Percent: 3.14 %
Brady Statistic RA Percent Paced: 95.4 %
Brady Statistic RV Percent Paced: 0.07 %
Date Time Interrogation Session: 20260116012305
HighPow Impedance: 39 Ohm
HighPow Impedance: 52 Ohm
Implantable Lead Connection Status: 753985
Implantable Lead Connection Status: 753985
Implantable Lead Implant Date: 20120531
Implantable Lead Implant Date: 20120531
Implantable Lead Location: 753859
Implantable Lead Location: 753860
Implantable Lead Model: 4076
Implantable Lead Model: 6947
Implantable Pulse Generator Implant Date: 20180530
Lead Channel Impedance Value: 342 Ohm
Lead Channel Impedance Value: 399 Ohm
Lead Channel Impedance Value: 456 Ohm
Lead Channel Pacing Threshold Amplitude: 0.75 V
Lead Channel Pacing Threshold Amplitude: 0.75 V
Lead Channel Pacing Threshold Pulse Width: 0.4 ms
Lead Channel Pacing Threshold Pulse Width: 0.4 ms
Lead Channel Sensing Intrinsic Amplitude: 0.75 mV
Lead Channel Sensing Intrinsic Amplitude: 0.75 mV
Lead Channel Sensing Intrinsic Amplitude: 12.125 mV
Lead Channel Sensing Intrinsic Amplitude: 12.125 mV
Lead Channel Setting Pacing Amplitude: 1.5 V
Lead Channel Setting Pacing Amplitude: 2 V
Lead Channel Setting Pacing Pulse Width: 0.4 ms
Lead Channel Setting Sensing Sensitivity: 0.3 mV
Zone Setting Status: 755011
Zone Setting Status: 755011

## 2024-12-15 ENCOUNTER — Ambulatory Visit: Payer: Self-pay | Admitting: Student in an Organized Health Care Education/Training Program

## 2024-12-19 NOTE — Progress Notes (Signed)
 Remote ICD Transmission

## 2025-01-13 ENCOUNTER — Ambulatory Visit

## 2025-02-13 ENCOUNTER — Ambulatory Visit

## 2025-03-12 ENCOUNTER — Ambulatory Visit: Payer: Medicare PPO

## 2025-03-16 ENCOUNTER — Ambulatory Visit

## 2025-03-19 ENCOUNTER — Ambulatory Visit: Admitting: Student in an Organized Health Care Education/Training Program

## 2025-03-20 ENCOUNTER — Encounter: Admitting: Family Medicine

## 2025-06-11 ENCOUNTER — Ambulatory Visit: Payer: Medicare PPO

## 2025-09-10 ENCOUNTER — Ambulatory Visit: Payer: Medicare PPO

## 2025-12-10 ENCOUNTER — Ambulatory Visit: Payer: Medicare PPO
